# Patient Record
Sex: Female | Born: 1945 | Race: White | Hispanic: No | Smoking: Never smoker
Health system: Southern US, Community
[De-identification: ages and names within clinical notes are randomized; demographics above are authoritative.]

## PROBLEM LIST (undated history)

## (undated) DIAGNOSIS — I2510 Atherosclerotic heart disease of native coronary artery without angina pectoris: Secondary | ICD-10-CM

## (undated) DIAGNOSIS — F419 Anxiety disorder, unspecified: Secondary | ICD-10-CM

## (undated) DIAGNOSIS — E11319 Type 2 diabetes mellitus with unspecified diabetic retinopathy without macular edema: Secondary | ICD-10-CM

## (undated) DIAGNOSIS — E785 Hyperlipidemia, unspecified: Secondary | ICD-10-CM

## (undated) DIAGNOSIS — H35039 Hypertensive retinopathy, unspecified eye: Secondary | ICD-10-CM

## (undated) DIAGNOSIS — E039 Hypothyroidism, unspecified: Secondary | ICD-10-CM

## (undated) DIAGNOSIS — E119 Type 2 diabetes mellitus without complications: Secondary | ICD-10-CM

## (undated) DIAGNOSIS — I10 Essential (primary) hypertension: Secondary | ICD-10-CM

## (undated) DIAGNOSIS — E079 Disorder of thyroid, unspecified: Secondary | ICD-10-CM

## (undated) HISTORY — DX: Type 2 diabetes mellitus without complications: E11.9

## (undated) HISTORY — DX: Hypothyroidism, unspecified: E03.9

## (undated) HISTORY — DX: Hypertensive retinopathy, unspecified eye: H35.039

## (undated) HISTORY — PX: BREAST EXCISIONAL BIOPSY: SUR124

## (undated) HISTORY — DX: Atherosclerotic heart disease of native coronary artery without angina pectoris: I25.10

## (undated) HISTORY — DX: Type 2 diabetes mellitus with unspecified diabetic retinopathy without macular edema: E11.319

---

## 1998-05-03 ENCOUNTER — Other Ambulatory Visit: Admission: RE | Admit: 1998-05-03 | Discharge: 1998-05-03 | Payer: Self-pay | Admitting: Family Medicine

## 2005-03-05 ENCOUNTER — Inpatient Hospital Stay (HOSPITAL_COMMUNITY): Admission: EM | Admit: 2005-03-05 | Discharge: 2005-03-07 | Payer: Self-pay | Admitting: Emergency Medicine

## 2005-03-06 ENCOUNTER — Ambulatory Visit: Payer: Self-pay | Admitting: *Deleted

## 2006-06-05 ENCOUNTER — Ambulatory Visit (HOSPITAL_COMMUNITY): Admission: RE | Admit: 2006-06-05 | Discharge: 2006-06-05 | Payer: Self-pay | Admitting: Family Medicine

## 2006-08-03 ENCOUNTER — Emergency Department (HOSPITAL_COMMUNITY): Admission: EM | Admit: 2006-08-03 | Discharge: 2006-08-03 | Payer: Self-pay | Admitting: Emergency Medicine

## 2008-03-11 ENCOUNTER — Ambulatory Visit (HOSPITAL_COMMUNITY): Admission: RE | Admit: 2008-03-11 | Discharge: 2008-03-11 | Payer: Self-pay | Admitting: Family Medicine

## 2008-03-23 ENCOUNTER — Encounter: Admission: RE | Admit: 2008-03-23 | Discharge: 2008-03-23 | Payer: Self-pay | Admitting: Family Medicine

## 2008-03-25 ENCOUNTER — Encounter: Admission: RE | Admit: 2008-03-25 | Discharge: 2008-03-25 | Payer: Self-pay | Admitting: Family Medicine

## 2008-03-25 ENCOUNTER — Encounter (INDEPENDENT_AMBULATORY_CARE_PROVIDER_SITE_OTHER): Payer: Self-pay | Admitting: Diagnostic Radiology

## 2008-06-15 ENCOUNTER — Ambulatory Visit (HOSPITAL_BASED_OUTPATIENT_CLINIC_OR_DEPARTMENT_OTHER): Admission: RE | Admit: 2008-06-15 | Discharge: 2008-06-15 | Payer: Self-pay | Admitting: Surgery

## 2008-06-15 ENCOUNTER — Encounter: Admission: RE | Admit: 2008-06-15 | Discharge: 2008-06-15 | Payer: Self-pay | Admitting: Surgery

## 2008-06-15 ENCOUNTER — Encounter (INDEPENDENT_AMBULATORY_CARE_PROVIDER_SITE_OTHER): Payer: Self-pay | Admitting: Surgery

## 2008-09-14 ENCOUNTER — Emergency Department (HOSPITAL_COMMUNITY): Admission: EM | Admit: 2008-09-14 | Discharge: 2008-09-15 | Payer: Self-pay | Admitting: Emergency Medicine

## 2009-07-14 ENCOUNTER — Encounter: Admission: RE | Admit: 2009-07-14 | Discharge: 2009-07-14 | Payer: Self-pay | Admitting: Family Medicine

## 2010-07-03 ENCOUNTER — Other Ambulatory Visit: Payer: Self-pay | Admitting: Family Medicine

## 2010-07-03 DIAGNOSIS — Z1239 Encounter for other screening for malignant neoplasm of breast: Secondary | ICD-10-CM

## 2010-07-18 ENCOUNTER — Ambulatory Visit
Admission: RE | Admit: 2010-07-18 | Discharge: 2010-07-18 | Disposition: A | Discharge status: Home / Self Care | Payer: MEDICARE | Source: Ambulatory Visit | Attending: Family Medicine | Admitting: Family Medicine

## 2010-07-18 DIAGNOSIS — Z1239 Encounter for other screening for malignant neoplasm of breast: Secondary | ICD-10-CM

## 2010-09-20 LAB — DIFFERENTIAL
Basophils Absolute: 0 10*3/uL (ref 0.0–0.1)
Basophils Relative: 1 % (ref 0–1)
Eosinophils Absolute: 0.1 10*3/uL (ref 0.0–0.7)
Eosinophils Relative: 2 % (ref 0–5)
Lymphocytes Relative: 37 % (ref 12–46)
Monocytes Absolute: 0.4 10*3/uL (ref 0.1–1.0)

## 2010-09-20 LAB — CBC
RBC: 4.67 MIL/uL (ref 3.87–5.11)
WBC: 6.4 10*3/uL (ref 4.0–10.5)

## 2010-09-20 LAB — URINE CULTURE

## 2010-09-20 LAB — COMPREHENSIVE METABOLIC PANEL
ALT: 45 U/L — ABNORMAL HIGH (ref 0–35)
AST: 40 U/L — ABNORMAL HIGH (ref 0–37)
CO2: 24 mEq/L (ref 19–32)
Calcium: 9 mg/dL (ref 8.4–10.5)
Sodium: 134 mEq/L — ABNORMAL LOW (ref 135–145)
Total Protein: 7.6 g/dL (ref 6.0–8.3)

## 2010-09-20 LAB — URINALYSIS, ROUTINE W REFLEX MICROSCOPIC
Glucose, UA: NEGATIVE mg/dL
Ketones, ur: NEGATIVE mg/dL
Nitrite: NEGATIVE
Protein, ur: NEGATIVE mg/dL
Urobilinogen, UA: 0.2 mg/dL (ref 0.0–1.0)

## 2010-09-25 LAB — CBC
Hemoglobin: 14.2 g/dL (ref 12.0–15.0)
RBC: 4.85 MIL/uL (ref 3.87–5.11)
RDW: 13.8 % (ref 11.5–15.5)
WBC: 7.3 10*3/uL (ref 4.0–10.5)

## 2010-09-25 LAB — BASIC METABOLIC PANEL
Calcium: 9.8 mg/dL (ref 8.4–10.5)
Creatinine, Ser: 0.5 mg/dL (ref 0.4–1.2)
GFR calc non Af Amer: >60 mL/min (ref >60)
Sodium: 139 mEq/L (ref 135–145)

## 2010-09-25 LAB — DIFFERENTIAL
Eosinophils Absolute: 0.1 10*3/uL (ref 0.0–0.7)
Lymphs Abs: 2.5 10*3/uL (ref 0.7–4.0)
Monocytes Relative: 6 % (ref 3–12)
Neutrophils Relative %: 58 % (ref 43–77)

## 2010-09-25 LAB — GLUCOSE, CAPILLARY: Glucose-Capillary: 177 mg/dL — ABNORMAL HIGH (ref 70–99)

## 2010-10-24 NOTE — Op Note (Signed)
NAMELAURANN, MCMORRIS               ACCOUNT NO.:  [id number]   MEDICAL RECORD NO.:  [id number]          PATIENT TYPE:  AMB   LOCATION:  DSC                          FACILITY:  [facility]   PHYSICIAN:  Thomas A. Cornett, M.D.DATE OF BIRTH:  [birth year]   DATE OF PROCEDURE:  06/15/2008  DATE OF DISCHARGE:                               OPERATIVE REPORT   PREOPERATIVE DIAGNOSIS:  Left breast mass.   POSTOPERATIVE DIAGNOSIS:  Left breast mass.   PROCEDURE:  Left breast needle-localized lumpectomy.   SURGEON:  Maisie Fus A. Cornett, MD   ANESTHESIA:  LMA with 0.25% Sensorcaine local.   ESTIMATED BLOOD LOSS:  10 mL.   SPECIMEN:  Left breast tissue with localizing wire and preop clip, found  to be adequate by radiography.   INDICATIONS FOR PROCEDURE:  The patient is a 65 year old female who  underwent a left breast core biopsy.  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  There was some question if there was atypia or  not.  An excisional biopsy was recommended for definitive diagnosis.  She presents today for a left breast needle-localized biopsy for this.   DESCRIPTION OF PROCEDURE:  After undergoing left breast wire  localization in the radiology suite, the patient was brought to the  holding area and subsequently to the operating room.  After induction of  general anesthesia, the left breast was prepped and draped in a sterile  fashion.  We infiltrated the lateral portion of the breast with a wire  exited with 0.25% Sensorcaine.  Curvilinear incision was made adjacent  to the wire and dissection was carried around.  All tissue roughly in a  2-cm radius circumferentially was excised around the wire.  Radiographs  were done which showed the previously placed biopsy clip, localizing  wire, and region of interest to be within the specimen.  This was sent  to pathology.  The cavity was irrigated out.  It was closed in layers  with a deep layer of 3-0 Vicryl and subsequent 4-0  Monocryl layer.  Dermabond was applied as dressing.  All final counts of sponge, needle,  and instruments were found to be correct.  The patient was awoke, taken  to recovery in satisfactory condition.      Thomas A. Cornett, M.D.  Electronically Signed     TAC/MEDQ  D:  06/15/2008  T:  06/16/2008  Job:  [id number]   cc:   Melvyn Novas, MD

## 2010-10-27 NOTE — Procedures (Signed)
NAMEKIERSTON, PLASENCIA               ACCOUNT NO.:  [id number]   MEDICAL RECORD NO.:  [id number]          PATIENT TYPE:  INP   LOCATION:  IC06                          FACILITY:  [facility]   PHYSICIAN:  Vida Roller, M.D.   DATE OF BIRTH:  10-08-1945   DATE OF PROCEDURE:  03/07/2005  DATE OF DISCHARGE:                                    STRESS TEST   HISTORY:  Ms. Mccully is a 65 year old female with no known coronary disease  and atypical chest discomfort who was admitted to [facility].  She  has two sets of cardiac markers are negative for acute myocardial  infarction.  Cardiac risk factors include diabetes, hypertension and  hyperlipidemia.   BASELINE DATA:  Electrocardiogram reveals sinus rhythm at 97 beats per  minute, nonspecific ST abnormalities.  Blood pressure is 148/80.   The patient exercised for a total of 9 minutes through Bruce protocol stage  III and to 10.1 METS.  Maximum heart rate achieved was 178 beats per minute  which is 109% of predicted maximum.  Maximum blood pressure is 178/70 and  resolved down to 128/60 in recovery.   Electrocardiogram reveals no arrhythmias.  No ischemic changes were noted.  Exercise was stopped secondary to fatigue/shortness of breath.  The patient  denied any chest discomfort with exercise.  She did have some mild shortness  of breath that resolved in recovery.   Final images and results are pending M.D. review.      Jae Dire, P.A. [facility]      Vida Roller, M.D.  Electronically Signed    AB/MEDQ  D:  03/07/2005  T:  03/08/2005  Job:  [id number]

## 2010-10-27 NOTE — Consult Note (Signed)
Megan Schroeder, Megan Schroeder               ACCOUNT NO.:  [id number]   MEDICAL RECORD NO.:  [id number]          PATIENT TYPE:  INP   LOCATION:  IC06                          FACILITY:  [facility]   PHYSICIAN:  Vida Roller, M.D.   DATE OF BIRTH:  1945/07/07   DATE OF CONSULTATION:  03/06/2005  DATE OF DISCHARGE:                                   CONSULTATION   CARDIOLOGY CONSULTATION:   PRIMARY CARE PHYSICIAN:  Dr. Oval Linsey   REFERRING PHYSICIAN:  Dr. Christen Butter   HISTORY OF PRESENT ILLNESS:  Megan Schroeder is a 65 year old woman with  multiple cardiac risk factors who presents with a single episode of  discomfort in her chest, this started when she was putting some clothes in  the dryer at home.  She had the onset of severe neck pain followed by  substernal chest discomfort which progressively worsened until she presented  to Dr. Otilio Saber office.  She was evaluated at that time, given a  sublingual nitroglycerin with resolution of the discomfort and she was  admitted to [facility] for further evaluation.  She has had no  further discomfort since being in the hospital.  She has been on a  nitroglycerin drip.  She denies any PND or orthopnea; no lower extremity  edema.  Very active woman without any significant complaints.   MEDICATIONS PRIOR TO ADMISSION:  1.  Caduet 10/20 once a day.  2.  Glucophage 500 mg twice a day.  3.  Synthroid 75 mcg once a day.  4.  Clonidine 0.1 mg twice a day.  5.  Naprosyn 500 mg twice a day as needed.  6.  Benicar 40/12.5 mg once a day.  7.  Vitamin E once a day.  8.  Vitamin D with calcium 500 mg once a day.   In the hospital she is on aspirin 325 once a day, Lopressor 50 mg twice a  day, Glucophage 500 mg twice a day, Ativan 1 mg at bedtime, Synthroid 75 mcg  once a day, Avapro 300 mg once a day, Norvasc 10 mg once a day, Lipitor 20  mg once a day, Lovenox 80 mg q.12 h., nitroglycerin infusion.   PAST MEDICAL HISTORY:  Her past medical  history is significant for a stress  test in 1998 which was normal.  She has hypertension, hyperlipidemia,  diabetes mellitus, has no end organ problems with any of those.  She has a  history of vertigo and hypothyroidism.   SOCIAL HISTORY:  She lives in [place] with her husband.  She is retired.  She is married.  She has one son who is healthy.  She does not smoke, drink  or use illicit drugs.  She walks 2-1/2 miles every day without any trouble.   FAMILY HISTORY:  Her mother is alive age 29 with hypertension and  hypothyroidism.  Her father died of lung cancer and throat cancer in his 26s  and he had a myocardial infarction.  She has two sisters and two brothers  all of whom are healthy and none of them have coronary disease.  REVIEW OF SYSTEMS:  She denies any fever or chills, sweats, weight loss, or  adenopathy.  She has an occasional headache on the left side but no sinus  tenderness, no nasal discharge, no bleeding or voice changes, no vertigo, no  photophobia.  She denies any rash or lesions, denies any shortness of  breath, dyspnea on exertion, PND, orthopnea, lower extremity edema,  palpitations, she does have a history of panic attacks and will get  presyncopal during those but no claudication, cough, or wheezing, no  frequency, urgency, or dysuria, no weakness or numbness, no depression or  anxiety, no myalgias or arthralgias, joint swelling, no nausea, vomiting,  diarrhea, or bright red blood per rectum, melena, hematochezia, no  odynophagia or dysphagia, no GERD symptoms, no abdominal pain, no polyuria  or polydipsia.  The remainder of her review of systems is negative.   PHYSICAL EXAMINATION:  GENERAL:  She is a well-developed, well-nourished  white female in no apparent distress alert and oriented x4 who denies any  chest pain.  VITAL SIGNS:  Pulse is 82, respirations are 22, blood pressure 104/58,  saturating 96% on room air, she is afebrile, she weighs 182.4 pounds.   HEENT:  Examination of the head, ears, eyes, nose and throat is  unremarkable.  The neck is supple.  There is no jugular venous distention or  carotid bruits.  CHEST:  Her chest is clear to auscultation bilaterally.  Cardiovascular exam  is regular, point of maximal impulse is not displaced, she has no murmur,  first and second heart sounds are normal.  SKIN:  Skin is without rashes.  BREAST/GU AND RECTAL:  Breast exam, GU and rectal exam are all deferred.  ABDOMEN:  Her abdomen is soft, nontender, normoactive bowel sounds, no  hepatosplenomegaly, no bruits noted.  EXTREMITIES:  Her extremities have no clubbing, cyanosis, or edema, no  bruits, 1+ pulses throughout.  MUSCULOSKELETAL/NEUROLOGIC:  Musculoskeletal exam is nonfocal as is the  neurologic exam.   CHEST X-RAY:  No acute abnormalities.   ECHOCARDIOGRAM:  Normal LV function with no valvular heart disease.   ELECTROCARDIOGRAM:  Sinus rhythm at a rate of 73 with normal intervals,  normal axes, no ischemic ST-T wave changes, no Q waves concerning for an old  myocardial infarction.   LABORATORIES:  White blood cell count 7.5, H&H of 13 and 38, platelet count  246.  Sodium 136, potassium 3.8, chloride 102, bicarb 26, BUN 7, creatinine  0.8 and her blood sugar is 251.  D-dimer 0.22.  BNP less than 30.  Point-of-  care markers x2 are negative.   IMPRESSION:  This is a woman with atypical chest pain but multiple cardiac  risk factors who has diabetes, hypertension and is mildly tachycardic.  My  sense is that an exercise Myoview is probably the best test to move forward  with her.  She probably needs to continue her medications, otherwise, I  recommend that she be placed on low dose aspirin and we will follow her with  you.      Vida Roller, M.D.  Electronically Signed     JH/MEDQ  D:  03/06/2005  T:  03/06/2005  Job:  [id number]   cc:   Melvyn Novas, MD  Fax: [phone]  Hanley Hays. Josefine Class, M.D.  Fax:  [phone]

## 2010-10-27 NOTE — Procedures (Signed)
NAMECHERIA, SADIQ               ACCOUNT NO.:  [id number]   MEDICAL RECORD NO.:  [id number]          PATIENT TYPE:  INP   LOCATION:  IC06                          FACILITY:  [facility]   PHYSICIAN:  Vida Roller, M.D.   DATE OF BIRTH:  10-Aug-1945   DATE OF PROCEDURE:  03/06/2005  DATE OF DISCHARGE:                                  ECHOCARDIOGRAM   PRIMARY CARE PHYSICIAN:  Hanley Hays. Dechurch, M.D.   TAPE NUMBER:  [id number]   TAPE COUNT:  [id number]   HISTORY OF PRESENT ILLNESS:  This is a 65 year old woman with chest  discomfort.  Technical quality of the study is difficult.   M-MODE TRACINGS:  Aorta is 23 mm.   Left atrium is 37 mm.   Septum is 13 mm.   Posterior wall is 12 mm.   Left ventricular diastolic dimension 37 mm.   Left ventricular systolic dimension 25 mm.   A 2D AND DOPPLER IMAGING:  The left ventricle is normal size with preserved  LV systolic function, estimated ejection fraction 60-65%.  There is no  obvious wall motion abnormality.  It appears to be concentric left  ventricular hypertrophy.   The right ventricle is top normal in size with normal systolic function.   Both atria appear to be normal size.  There is no obvious valvular heart  disease, although, the valves were not interrogated adequately due to the  technical nature of the study.   There is no obvious pericardial effusion.      Vida Roller, M.D.  Electronically Signed     JH/MEDQ  D:  03/06/2005  T:  03/07/2005  Job:  [id number]

## 2010-10-27 NOTE — Discharge Summary (Signed)
Megan Schroeder, WINGERT               ACCOUNT NO.:  [id number]   MEDICAL RECORD NO.:  [id number]          PATIENT TYPE:  INP   LOCATION:  IC06                          FACILITY:  [facility]   PHYSICIAN:  Hanley Hays. Dechurch, M.D.DATE OF BIRTH:  [birth year]   DATE OF ADMISSION:  03/05/2005  DATE OF DISCHARGE:  09/27/2006LH                                 DISCHARGE SUMMARY   DISCHARGE DIAGNOSES:  1.  Atypical chest pain, negative Myoview, normal ejection fraction of 73%.  2.  Hypertension.  3.  Diabetes mellitus.  4.  Hypothyroidism, new diagnosis.  5.  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  6.  Left cervical radiculopathy, chronic and intermittent.  7.  History of benign paroxysmal positional vertigo.   HOSPITAL COURSE:  The patient is a 65 year old, Caucasian female with  diabetes mellitus x2 years and hypertension, hyperlipidemia and positive  family history who was in her usual state of health until the day of  admission when she had a sudden onset of a left posterior neck sensation  which she described as a sharp electric shock feeling that lasted seconds.  She has been having problems with her left neck for some time for which she  treats with rubs and over-the-counter products due to her spondylosis.  In  any event, she then developed substernal chest pressure which persisted.  She was seen by her primary care physician who gave her sublingual  nitroglycerin and  aspirin with near resolution of pain.  She was referred  to the emergency room for further evaluation and admission.  The patient had  no EKG changes.  Her cardiac enzymes remained negative.  She was initially  placed on nitroglycerin drip which was withdrawn without any further chest  pain.  The patient also had no further neck pain while in the hospital.  Records were reviewed from [facility] as well as  [facility] from  [place], when her symptoms with her vertigo and chest pain were evaluated.  The  patient was seen in consultation by cardiology who felt that her symptoms  were somewhat atypical and she underwent a stress Myoview.  There was no  evidence of ischemia or other abnormalities.  The patient also had an  echocardiogram which revealed normal LV size.  There were no other obvious  findings as the study was technically limited.  Chest x-ray was unremarkable  as well.  Laboratory tests were all normal as well.  TSH and lipid panel  were obtained, but not complete at the time of discharge.  In any event, the  patient was deemed stable for discharge.  Again, she had no further neck  pain during the hospital stay.  Because of her previous findings, dictated  by Dr. Danie Chandler  neurologist at [facility], it was felt  that any further workup of her neck pain would not  be necessary at this  time. The findings were discussed at length  with the patient and her spouse.  He was somewhat reluctant to  be discharged without doing some sort of  workup of her neck, but after a long discussion with the patient it was  clearly not indicated.  They agreed to the discharge and if symptoms from  her neck persisted, worsened, was associated with weakness or paresthesias  she was advised to  notify her primary MD or proceed to the ER.   DISCHARGE MEDICATIONS:  1.  Glucophage 500 mg b.i.d.  2.  Synthroid 75 mcg daily.  3.  Aspirin 81 mg daily.  4.  Clonidine 0.1 b.i.d.  5.  Caduat 10/25 b.i.d.  6.  Benicar 40 mg daily.  7.  Calcium plus D 500 mg daily.  8.  Naprosyn 500 b.i.d. which she was not using.   SPECIAL INSTRUCTIONS:  The patient was instructed to call to the be  recurrent symptoms or other as any other complaints.      Hanley Hays Josefine Class, M.D.  Electronically Signed     FED/MEDQ  D:  03/07/2005  T:  03/08/2005  Job:  [id number]

## 2011-06-27 ENCOUNTER — Other Ambulatory Visit: Payer: Self-pay | Admitting: Family Medicine

## 2011-06-27 DIAGNOSIS — Z1231 Encounter for screening mammogram for malignant neoplasm of breast: Secondary | ICD-10-CM

## 2011-07-21 ENCOUNTER — Inpatient Hospital Stay (HOSPITAL_COMMUNITY)
Admission: EM | Admit: 2011-07-21 | Discharge: 2011-07-24 | DRG: 287 | Disposition: A | Discharge status: Home / Self Care | Payer: Medicare Other | Attending: Internal Medicine | Admitting: Internal Medicine

## 2011-07-21 ENCOUNTER — Emergency Department (HOSPITAL_COMMUNITY): Payer: Medicare Other

## 2011-07-21 ENCOUNTER — Encounter (HOSPITAL_COMMUNITY): Payer: Self-pay | Admitting: Emergency Medicine

## 2011-07-21 DIAGNOSIS — R0789 Other chest pain: Principal | ICD-10-CM | POA: Diagnosis present

## 2011-07-21 DIAGNOSIS — R079 Chest pain, unspecified: Secondary | ICD-10-CM | POA: Diagnosis present

## 2011-07-21 DIAGNOSIS — I10 Essential (primary) hypertension: Secondary | ICD-10-CM | POA: Diagnosis present

## 2011-07-21 DIAGNOSIS — E119 Type 2 diabetes mellitus without complications: Secondary | ICD-10-CM | POA: Diagnosis present

## 2011-07-21 DIAGNOSIS — IMO0001 Reserved for inherently not codable concepts without codable children: Secondary | ICD-10-CM | POA: Diagnosis present

## 2011-07-21 DIAGNOSIS — D649 Anemia, unspecified: Secondary | ICD-10-CM | POA: Diagnosis present

## 2011-07-21 DIAGNOSIS — R51 Headache: Secondary | ICD-10-CM | POA: Diagnosis present

## 2011-07-21 DIAGNOSIS — E785 Hyperlipidemia, unspecified: Secondary | ICD-10-CM | POA: Diagnosis present

## 2011-07-21 DIAGNOSIS — I2510 Atherosclerotic heart disease of native coronary artery without angina pectoris: Secondary | ICD-10-CM | POA: Diagnosis present

## 2011-07-21 DIAGNOSIS — R519 Headache, unspecified: Secondary | ICD-10-CM | POA: Diagnosis present

## 2011-07-21 HISTORY — DX: Hyperlipidemia, unspecified: E78.5

## 2011-07-21 HISTORY — DX: Essential (primary) hypertension: I10

## 2011-07-21 HISTORY — DX: Disorder of thyroid, unspecified: E07.9

## 2011-07-21 LAB — CBC
HCT: 37 % (ref 36.0–46.0)
MCHC: 34.1 g/dL (ref 30.0–36.0)
MCV: 84.3 fL (ref 78.0–100.0)
RDW: 13.4 % (ref 11.5–15.5)

## 2011-07-21 LAB — BASIC METABOLIC PANEL
BUN: 10 mg/dL (ref 6–23)
Chloride: 96 mEq/L (ref 96–112)
Creatinine, Ser: 0.56 mg/dL (ref 0.50–1.10)
GFR calc Af Amer: >90 mL/min (ref >90)
GFR calc non Af Amer: >90 mL/min (ref >90)
Glucose, Bld: 251 mg/dL — ABNORMAL HIGH (ref 70–99)

## 2011-07-21 LAB — TROPONIN I: Troponin I: <0.3 ng/mL (ref <0.30)

## 2011-07-21 MED ORDER — ASPIRIN 325 MG PO TABS
325.0000 mg | ORAL_TABLET | ORAL | Status: AC
  Filled 2011-07-21: qty 1

## 2011-07-21 MED ORDER — MORPHINE SULFATE 4 MG/ML IJ SOLN
4.0000 mg | Freq: Once | INTRAMUSCULAR | Status: AC
  Administered 2011-07-21: 4 mg via INTRAVENOUS
  Filled 2011-07-21: qty 1

## 2011-07-21 MED ORDER — NITROGLYCERIN 0.4 MG SL SUBL
0.4000 mg | SUBLINGUAL_TABLET | SUBLINGUAL | Status: DC | PRN
  Administered 2011-07-21: 0.4 mg via SUBLINGUAL
  Filled 2011-07-21: qty 25

## 2011-07-21 NOTE — H&P (Signed)
Megan Schroeder is an 66 y.o. female.   Chief Complaint: Chest Pain HPI: 66 YO with history of HTN, DM and Hyperlipidemia here with one day history of Chest pain rated as 7/10, no radiation no diaporesis, no fever, no SOB, no cough. Patient had a headache yesterday prior to the chest pain. Chest pain was relieved by Nitro in the Er and was worsened by exertion. Has risk factors for CAD-HTN, DM, Hyperlipidemia and family history of CAD-Father had MI at 75.  Past Medical History  Diagnosis Date  . Diabetes mellitus   . Hypertension   . Thyroid disease   . Hyperlipidemia     History reviewed. No pertinent past surgical history.  History reviewed. No pertinent family history. Social History:  reports that she has never smoked. She has never used smokeless tobacco. She reports that she does not drink alcohol or use illicit drugs.  Allergies:  Allergies  Allergen Reactions  . Penicillins Hives    Medications Prior to Admission  Medication Dose Route Frequency Provider Last Rate Last Dose  . aspirin tablet 325 mg  325 mg Oral STAT Forbes Cellar, MD      . morphine 4 MG/ML injection 4 mg  4 mg Intravenous Once Verne Carrow, MD   4 mg at 07/21/11 2127  . nitroGLYCERIN (NITROSTAT) SL tablet 0.4 mg  0.4 mg Sublingual Q5 min PRN Forbes Cellar, MD   0.4 mg at 07/21/11 2110   No current outpatient prescriptions on file as of 07/21/2011.    Results for orders placed during the hospital encounter of 07/21/11 (from the past 48 hour(s))  CBC     Status: Normal   Collection Time   07/21/11  8:40 PM      Component Value Range Comment   WBC 7.3  4.0 - 10.5 (K/uL)    RBC 4.39  3.87 - 5.11 (MIL/uL)    Hemoglobin 12.6  12.0 - 15.0 (g/dL)    HCT 98.1  19.1 - 47.8 (%)    MCV 84.3  78.0 - 100.0 (fL)    MCH 28.7  26.0 - 34.0 (pg)    MCHC 34.1  30.0 - 36.0 (g/dL)    RDW 29.5  62.1 - 30.8 (%)    Platelets 169  150 - 400 (K/uL)   BASIC METABOLIC PANEL     Status: Abnormal   Collection Time   07/21/11   8:40 PM      Component Value Range Comment   Sodium 135  135 - 145 (mEq/L)    Potassium 3.5  3.5 - 5.1 (mEq/L)    Chloride 96  96 - 112 (mEq/L)    CO2 27  19 - 32 (mEq/L)    Glucose, Bld 251 (*) 70 - 99 (mg/dL)    BUN 10  6 - 23 (mg/dL)    Creatinine, Ser 6.57  0.50 - 1.10 (mg/dL)    Calcium 84.6  8.4 - 10.5 (mg/dL)    GFR calc non Af Amer >90  >90 (mL/min)    GFR calc Af Amer >90  >90 (mL/min)   PRO B NATRIURETIC PEPTIDE     Status: Normal   Collection Time   07/21/11  8:41 PM      Component Value Range Comment   Pro B Natriuretic peptide (BNP) 16.2  0 - 125 (pg/mL)   POCT I-STAT TROPONIN I     Status: Normal   Collection Time   07/21/11  8:54 PM  Component Value Range Comment   Troponin i, poc 0.00  0.00 - 0.08 (ng/mL)    Comment 3            TROPONIN I     Status: Normal   Collection Time   07/21/11  9:09 PM      Component Value Range Comment   Troponin I <0.30  <0.30 (ng/mL)    Chest Portable 1 View  07/21/2011  *RADIOLOGY REPORT*  Clinical Data: Headache, chest pain, and chest pressure  PORTABLE CHEST - 1 VIEW  Comparison: 09/25 of six  Findings: Shallow inspiration with elevation of the right hemidiaphragm.  Heart size and pulmonary vascularity are normal. No focal airspace consolidation in the lungs.  No blunting of costophrenic angles.  No pneumothorax.  No significant change since previous study.  IMPRESSION: No evidence of active pulmonary disease.  Original Report Authenticated By: Marlon Pel, M.D.    Review of Systems  Constitutional: Negative.   HENT: Negative.   Eyes: Negative.   Respiratory: Negative.   Cardiovascular: Positive for chest pain. Negative for palpitations, orthopnea, claudication, leg swelling and PND.  Gastrointestinal: Negative.   Genitourinary: Negative.   Musculoskeletal: Negative.   Skin: Negative.   Neurological: Negative.   Endo/Heme/Allergies: Negative.   Psychiatric/Behavioral: Negative.     Blood pressure 117/63, pulse 94,  temperature 98.3 F (36.8 C), temperature source Oral, resp. rate 17, height 5\' 3"  (1.6 m), weight 74.844 kg (165 lb), SpO2 99.00%. Physical Exam  Nursing note and vitals reviewed. Constitutional: She is oriented to person, place, and time. She appears well-developed and well-nourished.  HENT:  Head: Normocephalic and atraumatic.  Right Ear: External ear normal.  Left Ear: External ear normal.  Eyes: Conjunctivae and EOM are normal. Pupils are equal, round, and reactive to light.  Neck: Normal range of motion. Neck supple.  Cardiovascular: Normal rate, regular rhythm, normal heart sounds and intact distal pulses.   Respiratory: Effort normal and breath sounds normal.  GI: Soft. Bowel sounds are normal.  Musculoskeletal: Normal range of motion.  Neurological: She is alert and oriented to person, place, and time.  Skin: Skin is warm and dry.  Psychiatric: She has a normal mood and affect. Her behavior is normal. Judgment and thought content normal.     Assessment/Plan 1. Chest Pain: Patient has significant risk factors for CAD and will be admitted for observation and MI rule out. Will need a stress test if enzymes are negative.Give SL NTG, Aspirin, Morphine. 2. DM: Continue home medications and SSI if needed 3. HTN: Controlled 4. Hyperlipidemia: Statin  Montford Barg,LAWAL 07/21/2011, 10:46 PM

## 2011-07-21 NOTE — ED Provider Notes (Signed)
History     CSN: [id number]  Arrival date & time 07/21/11  2018   First MD Initiated Contact with Patient 07/21/11 2054      Chief Complaint  Patient presents with  . Chest Pain    denies pain c/o chest pressure    (Consider location/radiation/quality/duration/timing/severity/associated sxs/prior treatment) Patient is a 66 y.o. female presenting with chest pain. The history is provided by the patient.  Chest Pain Episode onset: about 3 hours prior to arrival in ED.  Duration of episode(s) is 3 hours. Chest pain occurs constantly. The chest pain is improving. Associated with: onset while watching TV with her grandchild. At its most intense, the pain is at 10/10. The pain is currently at 7/10. The quality of the pain is described as pressure-like. The pain does not radiate. Exacerbated by: Not worse with deep breathing. Not worse with exertion. Primary symptoms include shortness of breath and nausea. Pertinent negatives for primary symptoms include no fever, no fatigue, no syncope, no cough, no wheezing, no palpitations, no abdominal pain, no vomiting, no dizziness and no altered mental status.  Pertinent negatives for associated symptoms include no claudication, no diaphoresis, no lower extremity edema, no numbness, no orthopnea, no paroxysmal nocturnal dyspnea and no weakness. She tried aspirin and nitroglycerin for the symptoms.  Her past medical history is significant for diabetes, hyperlipidemia and hypertension.  Pertinent negatives for past medical history include no seizures.     Past Medical History  Diagnosis Date  . Diabetes mellitus   . Hypertension   . Thyroid disease   . Hyperlipidemia     History reviewed. No pertinent past surgical history.  History reviewed. No pertinent family history.  History  Substance Use Topics  . Smoking status: Never Smoker   . Smokeless tobacco: Never Used  . Alcohol Use: No    OB History    Grav Para Term Preterm Abortions TAB SAB  Ect Mult Living                  Review of Systems  Constitutional: Negative for fever, chills, diaphoresis, activity change, appetite change and fatigue.  HENT: Negative for congestion, sore throat, rhinorrhea, neck pain and neck stiffness.   Eyes: Negative for photophobia, redness and visual disturbance.  Respiratory: Positive for shortness of breath. Negative for cough and wheezing.   Cardiovascular: Positive for chest pain. Negative for palpitations, orthopnea, claudication, leg swelling and syncope.  Gastrointestinal: Positive for nausea. Negative for vomiting, abdominal pain, diarrhea, constipation and blood in stool.  Genitourinary: Negative for dysuria, urgency, hematuria and flank pain.  Musculoskeletal: Negative for back pain.  Skin: Negative for rash and wound.  Neurological: Negative for dizziness, seizures, facial asymmetry, speech difficulty, weakness, light-headedness, numbness and headaches.  Psychiatric/Behavioral: Negative for confusion and altered mental status.  All other systems reviewed and are negative.    Allergies  Penicillins  Home Medications   Current Outpatient Rx  Name Route Sig Dispense Refill  . AMLODIPINE BESYLATE 10 MG PO TABS Oral Take 10 mg by mouth daily.    . ASPIRIN EC 81 MG PO TBEC Oral Take 81 mg by mouth daily.    . ATORVASTATIN CALCIUM 20 MG PO TABS Oral Take 20 mg by mouth daily.    . INSULIN GLARGINE 100 UNIT/ML [place] SOLN Subcutaneous Inject 38 Units into the skin at bedtime.    Marland Kitchen LEVOTHYROXINE SODIUM 75 MCG PO TABS Oral Take 75 mcg by mouth daily.    Marland Kitchen METFORMIN HCL 500 MG  PO TABS Oral Take 500 mg by mouth 2 (two) times daily with a meal.    . OLMESARTAN MEDOXOMIL-HCTZ 40-12.5 MG PO TABS Oral Take 1 tablet by mouth daily.      BP 146/74  Pulse 105  Temp(Src) 98.3 F (36.8 C) (Oral)  Resp 19  Ht 5\' 3"  (1.6 m)  Wt 165 lb (74.844 kg)  BMI 29.23 kg/m2  SpO2 99%  Physical Exam  Nursing note and vitals reviewed. Constitutional:  She is oriented to person, place, and time. She appears well-developed and well-nourished.  Non-toxic appearance. No distress.  HENT:  Head: Normocephalic and atraumatic.  Mouth/Throat: Oropharynx is clear and moist.  Eyes: Conjunctivae and EOM are normal. Pupils are equal, round, and reactive to light. No scleral icterus.  Neck: Normal range of motion. Neck supple. No JVD present.  Cardiovascular: Normal rate, regular rhythm, normal heart sounds and intact distal pulses.   No murmur heard. Pulmonary/Chest: Effort normal and breath sounds normal. No respiratory distress. She has no wheezes. She has no rales.  Abdominal: Soft. Bowel sounds are normal. She exhibits no distension. There is no tenderness. There is no rebound and no guarding.  Musculoskeletal: Normal range of motion.  Neurological: She is alert and oriented to person, place, and time. She has normal strength. No cranial nerve deficit. GCS eye subscore is 4. GCS verbal subscore is 5. GCS motor subscore is 6.  Skin: Skin is warm and dry. No rash noted. She is not diaphoretic.  Psychiatric: She has a normal mood and affect.    ED Course  Procedures (including critical care time)   Date: 07/21/2011  Rate: 101  Rhythm: sinus tachycardia  QRS Axis: normal  Intervals: normal  ST/T Wave abnormalities: normal  Conduction Disutrbances:none  Narrative Interpretation: sinus tachycardia, rate 101, with no ST segment changes  Old EKG Reviewed: unchanged from 06/14/08    Labs Reviewed  BASIC METABOLIC PANEL - Abnormal; Notable for the following:    Glucose, Bld 251 (*)    All other components within normal limits  CBC  PRO B NATRIURETIC PEPTIDE  POCT I-STAT TROPONIN I  TROPONIN I   Chest Portable 1 View  07/21/2011  *RADIOLOGY REPORT*  Clinical Data: Headache, chest pain, and chest pressure  PORTABLE CHEST - 1 VIEW  Comparison: 09/25 of six  Findings: Shallow inspiration with elevation of the right hemidiaphragm.  Heart size and  pulmonary vascularity are normal. No focal airspace consolidation in the lungs.  No blunting of costophrenic angles.  No pneumothorax.  No significant change since previous study.  IMPRESSION: No evidence of active pulmonary disease.  Original Report Authenticated By: Marlon Pel, M.D.     1. Chest pain       MDM  65yo CF with PMH significant HTN, HLD, and DM who presents to the ED due to chest pain. Pain onset about 3h PTA. Pain described as pressure, feels like "elephant" sitting on her chest. Also having dyspnea. Pt called EMS took 2 81mg  ASA at home and EMS instructed her to take an additional 3- 81mg . Got nitro via EMS with improvement of pain from 10 to 7. Will give additional nitro. Pt c/o HA had mild HA previously but worse after nitro, will give morphine and tylenol. EKG with no ST changes. Concern for possible NSTEMI vs UA. Doubt PE cannot PERC due to age, pt did initially have elevated HR on arrival but now HR in 80s, she is low risk [place] so will not persue  any further PE workup as unlikely with this presentation.   Pt pain free after additional nitro SL given.   Initial trop neg.   Pts PCP in [place]. [facility] consulted for admission.        Verne Carrow, MD 07/21/11 [id number]

## 2011-07-21 NOTE — ED Notes (Signed)
Pt arrived via [facility] c/o chest pressure that started after headache. Headache x2 days chest pressure start this PM

## 2011-07-21 NOTE — ED Provider Notes (Signed)
I saw and evaluated the patient, reviewed the resident's note and I agree with the findings and plan, ekg interpretation (reviewed by me)    Forbes Cellar, MD 07/21/11 [phone]

## 2011-07-22 ENCOUNTER — Other Ambulatory Visit: Payer: Self-pay

## 2011-07-22 ENCOUNTER — Encounter (HOSPITAL_COMMUNITY): Payer: Self-pay | Admitting: *Deleted

## 2011-07-22 DIAGNOSIS — R519 Headache, unspecified: Secondary | ICD-10-CM | POA: Diagnosis present

## 2011-07-22 DIAGNOSIS — R079 Chest pain, unspecified: Secondary | ICD-10-CM

## 2011-07-22 DIAGNOSIS — R51 Headache: Secondary | ICD-10-CM | POA: Diagnosis present

## 2011-07-22 LAB — GLUCOSE, CAPILLARY
Glucose-Capillary: 167 mg/dL — ABNORMAL HIGH (ref 70–99)
Glucose-Capillary: 261 mg/dL — ABNORMAL HIGH (ref 70–99)

## 2011-07-22 LAB — CARDIAC PANEL(CRET KIN+CKTOT+MB+TROPI)
CK, MB: 2.5 ng/mL (ref 0.3–4.0)
Relative Index: INVALID (ref 0.0–2.5)
Total CK: 82 U/L (ref 7–177)
Total CK: 80 U/L (ref 7–177)

## 2011-07-22 LAB — PRO B NATRIURETIC PEPTIDE: Pro B Natriuretic peptide (BNP): 16.6 pg/mL (ref 0–125)

## 2011-07-22 LAB — CBC
HCT: 36.3 % (ref 36.0–46.0)
Hemoglobin: 12.2 g/dL (ref 12.0–15.0)
MCH: 28.4 pg (ref 26.0–34.0)
MCHC: 33.6 g/dL (ref 30.0–36.0)
MCV: 84.6 fL (ref 78.0–100.0)
RDW: 13.5 % (ref 11.5–15.5)

## 2011-07-22 LAB — HEMOGLOBIN A1C
Hgb A1c MFr Bld: 10.2 % — ABNORMAL HIGH (ref <5.7)
Mean Plasma Glucose: 246 mg/dL — ABNORMAL HIGH (ref <117)

## 2011-07-22 MED ORDER — SODIUM CHLORIDE 0.9 % IV SOLN
INTRAVENOUS | Status: DC
  Administered 2011-07-23: 04:00:00 via INTRAVENOUS

## 2011-07-22 MED ORDER — LEVOTHYROXINE SODIUM 75 MCG PO TABS
75.0000 ug | ORAL_TABLET | Freq: Every day | ORAL | Status: DC
  Administered 2011-07-22 – 2011-07-24 (×3): 75 ug via ORAL
  Filled 2011-07-22 (×5): qty 1

## 2011-07-22 MED ORDER — HYDROCHLOROTHIAZIDE 12.5 MG PO CAPS
12.5000 mg | ORAL_CAPSULE | Freq: Every day | ORAL | Status: DC
  Administered 2011-07-22: 12.5 mg via ORAL
  Filled 2011-07-22 (×2): qty 1

## 2011-07-22 MED ORDER — ACETAMINOPHEN 650 MG RE SUPP: 650.0000 mg | Freq: Four times a day (QID) | RECTAL | Status: DC | PRN

## 2011-07-22 MED ORDER — SODIUM CHLORIDE 0.9 % IJ SOLN: 3.0000 mL | INTRAMUSCULAR | Status: DC | PRN

## 2011-07-22 MED ORDER — INSULIN ASPART 100 UNIT/ML ~~LOC~~ SOLN
0.0000 [IU] | Freq: Every day | SUBCUTANEOUS | Status: DC
  Administered 2011-07-23: 2 [IU] via SUBCUTANEOUS

## 2011-07-22 MED ORDER — SODIUM CHLORIDE 0.9 % IV SOLN: 250.0000 mL | INTRAVENOUS | Status: DC | PRN

## 2011-07-22 MED ORDER — ASPIRIN EC 81 MG PO TBEC: 81.0000 mg | DELAYED_RELEASE_TABLET | Freq: Every day | ORAL | Status: DC

## 2011-07-22 MED ORDER — OLMESARTAN MEDOXOMIL-HCTZ 40-12.5 MG PO TABS: 1.0000 | ORAL_TABLET | Freq: Every day | ORAL | Status: DC

## 2011-07-22 MED ORDER — ACETAMINOPHEN 325 MG PO TABS: 650.0000 mg | ORAL_TABLET | Freq: Four times a day (QID) | ORAL | Status: DC | PRN

## 2011-07-22 MED ORDER — OLMESARTAN MEDOXOMIL 40 MG PO TABS
40.0000 mg | ORAL_TABLET | Freq: Every day | ORAL | Status: DC
  Administered 2011-07-22 – 2011-07-24 (×3): 40 mg via ORAL
  Filled 2011-07-22 (×3): qty 1

## 2011-07-22 MED ORDER — ASPIRIN EC 325 MG PO TBEC
325.0000 mg | DELAYED_RELEASE_TABLET | Freq: Every day | ORAL | Status: DC
  Administered 2011-07-22 – 2011-07-24 (×2): 325 mg via ORAL
  Filled 2011-07-22 (×2): qty 1

## 2011-07-22 MED ORDER — SODIUM CHLORIDE 0.45 % IV SOLN
INTRAVENOUS | Status: DC
  Administered 2011-07-22: 08:00:00 via INTRAVENOUS

## 2011-07-22 MED ORDER — ENOXAPARIN SODIUM 40 MG/0.4ML ~~LOC~~ SOLN
40.0000 mg | SUBCUTANEOUS | Status: DC
  Administered 2011-07-22: 40 mg via SUBCUTANEOUS
  Filled 2011-07-22 (×3): qty 0.4

## 2011-07-22 MED ORDER — INSULIN GLARGINE 100 UNIT/ML ~~LOC~~ SOLN
38.0000 [IU] | Freq: Every day | SUBCUTANEOUS | Status: DC
  Administered 2011-07-22 – 2011-07-23 (×2): 38 [IU] via SUBCUTANEOUS
  Filled 2011-07-22: qty 3

## 2011-07-22 MED ORDER — AMLODIPINE BESYLATE 10 MG PO TABS
10.0000 mg | ORAL_TABLET | Freq: Every day | ORAL | Status: DC
  Administered 2011-07-22 – 2011-07-24 (×3): 10 mg via ORAL
  Filled 2011-07-22 (×3): qty 1

## 2011-07-22 MED ORDER — ASPIRIN 81 MG PO CHEW
324.0000 mg | CHEWABLE_TABLET | ORAL | Status: AC
  Administered 2011-07-23: 324 mg via ORAL
  Filled 2011-07-22: qty 4

## 2011-07-22 MED ORDER — SIMVASTATIN 20 MG PO TABS
20.0000 mg | ORAL_TABLET | Freq: Every day | ORAL | Status: DC
  Administered 2011-07-22 – 2011-07-23 (×2): 20 mg via ORAL
  Filled 2011-07-22 (×3): qty 1

## 2011-07-22 MED ORDER — SODIUM CHLORIDE 0.9 % IJ SOLN
3.0000 mL | Freq: Two times a day (BID) | INTRAMUSCULAR | Status: DC
  Administered 2011-07-22: 3 mL via INTRAVENOUS

## 2011-07-22 MED ORDER — ONDANSETRON HCL 4 MG/2ML IJ SOLN: 4.0000 mg | Freq: Four times a day (QID) | INTRAMUSCULAR | Status: DC | PRN

## 2011-07-22 MED ORDER — METFORMIN HCL 500 MG PO TABS
500.0000 mg | ORAL_TABLET | Freq: Two times a day (BID) | ORAL | Status: DC
  Administered 2011-07-22: 500 mg via ORAL
  Filled 2011-07-22 (×4): qty 1

## 2011-07-22 MED ORDER — NITROGLYCERIN 0.4 MG SL SUBL
0.4000 mg | SUBLINGUAL_TABLET | SUBLINGUAL | Status: DC | PRN
Start: 2011-07-22 — End: 2011-07-24

## 2011-07-22 MED ORDER — ONDANSETRON HCL 4 MG PO TABS: 4.0000 mg | ORAL_TABLET | Freq: Four times a day (QID) | ORAL | Status: DC | PRN

## 2011-07-22 MED ORDER — MORPHINE SULFATE 2 MG/ML IJ SOLN: 1.0000 mg | INTRAMUSCULAR | Status: DC | PRN

## 2011-07-22 MED ORDER — INSULIN ASPART 100 UNIT/ML ~~LOC~~ SOLN
0.0000 [IU] | Freq: Three times a day (TID) | SUBCUTANEOUS | Status: DC
  Administered 2011-07-22: 3 [IU] via SUBCUTANEOUS
  Administered 2011-07-22: 2 [IU] via SUBCUTANEOUS
  Administered 2011-07-23: 1 [IU] via SUBCUTANEOUS
  Administered 2011-07-23 – 2011-07-24 (×4): 2 [IU] via SUBCUTANEOUS
  Filled 2011-07-22: qty 3

## 2011-07-22 NOTE — Consult Note (Signed)
CARDIOLOGY CONSULT NOTE  Patient ID: Megan Schroeder MRN: [id number] DOB/AGE: [birth year] 66 y.o.  Admit date: 07/21/2011 Referring Physician: Claybon Jabs Primary Physician: [name], MD, MD Primary Cardiologist:  New Reason for Consultation: Chest Pain  Principal Problem:  *Chest pain Active Problems:  Hypertension  Diabetes mellitus  Hyperlipidemia  Headache   HPI:   66 yo IDDM, HTN, elevated lipids and family history CAD.  Admitted with severe episode of chest pain " elephant" sitting on my chest.  Lasted over an hour with some diaphoresis and dyspnea Some relief with nitro.  No gone this am.  R/O no acute ECG changes.  Saw [facility] in 1997 with syncope and negative w/u.  Gets primary care in [place].  Does walk with no chest pain in general.  BS  Poorly controlled with am BS around 170's.  Compliant with meds Been on insulin about a year.  No associated gi symptoms.  No pleuritic component.  Pain was central in chest 10/10 and radiated to neck She had a bad headache with it.  Both now improved  @ROS @ All other systems reviewed and negative except as noted above  Past Medical History  Diagnosis Date  . Diabetes mellitus   . Hypertension   . Thyroid disease   . Hyperlipidemia     History reviewed. No pertinent family history.  History   Social History  . Marital Status: Widowed    Spouse Name: N/A    Number of Children: N/A  . Years of Education: N/A   Occupational History  . Not on file.   Social History Main Topics  . Smoking status: Never Smoker   . Smokeless tobacco: Never Used  . Alcohol Use: No  . Drug Use: No  . Sexually Active: Yes    Birth Control/ Protection: Post-menopausal   Other Topics Concern  . Not on file   Social History Narrative  . No narrative on file    History reviewed. No pertinent past surgical history.      Marland Kitchen amLODipine  10 mg Oral Daily  . aspirin EC  325 mg Oral Daily  . aspirin  325 mg Oral STAT  . enoxaparin  40  mg Subcutaneous Q24H  . hydrochlorothiazide  12.5 mg Oral Daily  . insulin aspart  0-5 Units Subcutaneous QHS  . insulin aspart  0-9 Units Subcutaneous TID WC  . insulin glargine  38 Units Subcutaneous QHS  . levothyroxine  75 mcg Oral QAC breakfast  .  morphine injection  4 mg Intravenous Once  . olmesartan  40 mg Oral Daily  . simvastatin  20 mg Oral q1800  . DISCONTD: aspirin EC  81 mg Oral Daily  . DISCONTD: metFORMIN  500 mg Oral BID WC  . DISCONTD: olmesartan-hydrochlorothiazide  1 tablet Oral Daily      . DISCONTD: sodium chloride 75 mL/hr at 07/22/11 0803    Physical Exam: Blood pressure 146/81, pulse 72, temperature 98.1 F (36.7 C), temperature source Oral, resp. rate 14, height 5\' 3"  (1.6 m), weight 77.2 kg (170 lb 3.1 oz), SpO2 98.00%.  Affect appropriate Obese white femal HEENT: normal Neck supple with no adenopathy JVP normal no bruits no thyromegaly Lungs clear with no wheezing and good diaphragmatic motion Heart:  S1/S2 no murmur, no rub, gallop or click PMI normal Abdomen: benighn, BS positve, no tenderness, no AAA no bruit.  No HSM or HJR Distal pulses intact with no bruits No edema Neuro non-focal Skin warm and dry No muscular  weakness   Labs:   Lab Results  Component Value Date   WBC 8.3 07/22/2011   HGB 12.2 07/22/2011   HCT 36.3 07/22/2011   MCV 84.6 07/22/2011   PLT 183 07/22/2011    Lab 07/22/11 0103 07/21/11 2040  NA -- 135  K -- 3.5  CL -- 96  CO2 -- 27  BUN -- 10  CREATININE 0.51 --  CALCIUM -- 10.4  PROT -- --  BILITOT -- --  ALKPHOS -- --  ALT -- --  AST -- --  GLUCOSE -- 251*   Lab Results  Component Value Date   TROPONINI <0.30 07/21/2011       Radiology: Chest Portable 1 View  07/21/2011  *RADIOLOGY REPORT*  Clinical Data: Headache, chest pain, and chest pressure  PORTABLE CHEST - 1 VIEW  Comparison: 09/25 of six  Findings: Shallow inspiration with elevation of the right hemidiaphragm.  Heart size and pulmonary  vascularity are normal. No focal airspace consolidation in the lungs.  No blunting of costophrenic angles.  No pneumothorax.  No significant change since previous study.  IMPRESSION: No evidence of active pulmonary disease.  Original Report Authenticated By: Marlon Pel, M.D.    EKG: NSR no acute ECG changes compared to 2010 lest lateral ST segment depression   ASSESSMENT AND PLAN:  Chest Pain:  Bad episode relieved with nitro.  IDDM and every other risk factor  Discussed with patient and husband.  Favor cath to risk stratify.  Risks and benefits discussed.  They are willing to proceed DM:  Check A1C.  Give lantus tonight and hold glucophage.  NPO after breakfast in am for cath Chol:  Continue statin HTN:  Continue ACE  Signed: Charlton Haws 07/22/2011, 11:37 AM

## 2011-07-22 NOTE — Progress Notes (Signed)
Subjective:   66 year old Caucasian female patient with history of hypertension, type 2 diabetes mellitus, hyperlipidemia, family history of coronary artery disease, physically quite active, presented with lower substernal 10 over 10 pressure-like chest pain which started approximately 6 PM last night at rest. Was nonradiating and not associated with dyspnea or diaphoresis. TUMS did not help her. EMS was called and she received 2 nitroglycerin sprays on route to the ED which helped the pain but it eventually resolved after sublingual nitroglycerin tablet in the emergency department. She says the pain lasted between 6 PM and midnight. She denies any chest pain this morning.  Review of systems: Mild intermittent left temporal headache for the last 2 days. No visual disturbance or earache or sore throat. Currently resolved.  Objective  Vital signs in last 24 hours: Filed Vitals:   07/22/11 0015 07/22/11 0050 07/22/11 0600 07/22/11 1011  BP: 102/51 143/78 129/78 146/81  Pulse: 75 79 72   Temp:  98.5 F (36.9 C) 98.1 F (36.7 C)   TempSrc:      Resp: 17 18 14    Height:  5\' 3"  (1.6 m)    Weight:  77.2 kg (170 lb 3.1 oz)    SpO2: 96% 93% 98%    Weight change:   Intake/Output Summary (Last 24 hours) at 07/22/11 1100 Last data filed at 07/22/11 0700  Gross per 24 hour  Intake    360 ml  Output      0 ml  Net    360 ml    Physical Exam:  General Exam: Comfortable.  Respiratory System: Clear. No increased work of breathing.  Cardiovascular System: First and second heart sounds heard. Regular rate and rhythm. No JVD/murmurs. Telemetry shows sinus rhythm. Gastrointestinal System: Abdomen is non distended, soft and normal bowel sounds heard.  Central Nervous System: Alert and oriented. No focal neurological deficits.  Labs:  Basic Metabolic Panel:  Lab 07/22/11 1610 07/21/11 2040  NA -- 135  K -- 3.5  CL -- 96  CO2 -- 27  GLUCOSE -- 251*  BUN -- 10  CREATININE 0.51 0.56  CALCIUM  -- 10.4  ALB -- --  PHOS -- --   Liver Function Tests: No results found for this basename: AST:3,ALT:3,ALKPHOS:3,BILITOT:3,PROT:3,ALBUMIN:3 in the last 168 hours No results found for this basename: LIPASE:3,AMYLASE:3 in the last 168 hours No results found for this basename: AMMONIA:3 in the last 168 hours CBC:  Lab 07/22/11 0103 07/21/11 2040  WBC 8.3 7.3  NEUTROABS -- --  HGB 12.2 12.6  HCT 36.3 37.0  MCV 84.6 84.3  PLT 183 169   Cardiac Enzymes:  Lab 07/21/11 2109  CKTOTAL --  CKMB --  CKMBINDEX --  TROPONINI <0.30   CBG:  Lab 07/22/11 0733 07/22/11 0053  GLUCAP 167* 261*    Iron Studies: No results found for this basename: IRON,TIBC,TRANSFERRIN,FERRITIN in the last 72 hours Studies/Results: Chest Portable 1 View  07/21/2011  *RADIOLOGY REPORT*  Clinical Data: Headache, chest pain, and chest pressure  PORTABLE CHEST - 1 VIEW  Comparison: 09/25 of six  Findings: Shallow inspiration with elevation of the right hemidiaphragm.  Heart size and pulmonary vascularity are normal. No focal airspace consolidation in the lungs.  No blunting of costophrenic angles.  No pneumothorax.  No significant change since previous study.  IMPRESSION: No evidence of active pulmonary disease.  Original Report Authenticated By: Marlon Pel, M.D.   EKG interpretation by ED MD: unchanged from 06/14/08. No ST T changes Medications:    .  sodium chloride 75 mL/hr at 07/22/11 0803      . amLODipine  10 mg Oral Daily  . aspirin EC  325 mg Oral Daily  . aspirin  325 mg Oral STAT  . enoxaparin  40 mg Subcutaneous Q24H  . hydrochlorothiazide  12.5 mg Oral Daily  . insulin glargine  38 Units Subcutaneous QHS  . levothyroxine  75 mcg Oral QAC breakfast  . metFORMIN  500 mg Oral BID WC  .  morphine injection  4 mg Intravenous Once  . olmesartan  40 mg Oral Daily  . simvastatin  20 mg Oral q1800  . DISCONTD: aspirin EC  81 mg Oral Daily  . DISCONTD: olmesartan-hydrochlorothiazide  1 tablet  Oral Daily    I  have reviewed scheduled and prn medications.     Problem/Plan: Principal Problem:  *Chest pain Active Problems:  Hypertension  Diabetes mellitus  Hyperlipidemia   1. Chest pain: Suspicious for angina in a patient with multiple risk factors for coronary artery disease including type 2 diabetes mellitus, hypertension, hyperlipidemia and family history. Continue to cycle cardiac enzymes. Continue aspirin and statins. [facility] has been consulted for further evaluation. 2. Headache: Unclear etiology. Resolved. 3. Uncontrolled type 2 diabetes mellitus: Continue home dose of Lantus. Hold metformin in case patient requires a contrasted study. Place on sliding scale insulin. Check hemoglobin A1c. 4. Hypertension: Controlled. Continue ARB, HCTZ and amlodipine. 5. Hyperlipidemia: Continue statins.  Discussed with patient and spouse at the bedside.   Heber Hoog 07/22/2011,11:00 AM  LOS: 1 day

## 2011-07-23 ENCOUNTER — Encounter (HOSPITAL_COMMUNITY)
Admission: EM | Disposition: A | Discharge status: Home / Self Care | Payer: Self-pay | Source: Home / Self Care | Attending: Internal Medicine

## 2011-07-23 DIAGNOSIS — I517 Cardiomegaly: Secondary | ICD-10-CM

## 2011-07-23 DIAGNOSIS — I2510 Atherosclerotic heart disease of native coronary artery without angina pectoris: Secondary | ICD-10-CM

## 2011-07-23 HISTORY — PX: LEFT HEART CATHETERIZATION WITH CORONARY ANGIOGRAM: SHX5451

## 2011-07-23 LAB — CARDIAC PANEL(CRET KIN+CKTOT+MB+TROPI)
CK, MB: 1.9 ng/mL (ref 0.3–4.0)
Total CK: 66 U/L (ref 7–177)

## 2011-07-23 LAB — BASIC METABOLIC PANEL
Chloride: 99 mEq/L (ref 96–112)
GFR calc Af Amer: >90 mL/min (ref >90)
Potassium: 3.7 mEq/L (ref 3.5–5.1)

## 2011-07-23 LAB — GLUCOSE, CAPILLARY: Glucose-Capillary: 164 mg/dL — ABNORMAL HIGH (ref 70–99)

## 2011-07-23 LAB — PROTIME-INR: Prothrombin Time: 13.7 seconds (ref 11.6–15.2)

## 2011-07-23 SURGERY — LEFT HEART CATHETERIZATION WITH CORONARY ANGIOGRAM
Anesthesia: Moderate Sedation

## 2011-07-23 MED ORDER — NITROGLYCERIN 0.2 MG/ML ON CALL CATH LAB
INTRAVENOUS | Status: AC
  Filled 2011-07-23: qty 1

## 2011-07-23 MED ORDER — ASPIRIN EC 325 MG PO TBEC: 325.0000 mg | DELAYED_RELEASE_TABLET | Freq: Every day | ORAL | Status: DC

## 2011-07-23 MED ORDER — OXYCODONE-ACETAMINOPHEN 5-325 MG PO TABS: 1.0000 | ORAL_TABLET | ORAL | Status: DC | PRN

## 2011-07-23 MED ORDER — MIDAZOLAM HCL 2 MG/2ML IJ SOLN
INTRAMUSCULAR | Status: AC
  Filled 2011-07-23: qty 2

## 2011-07-23 MED ORDER — LIDOCAINE HCL (PF) 1 % IJ SOLN
INTRAMUSCULAR | Status: AC
  Filled 2011-07-23: qty 30

## 2011-07-23 MED ORDER — METFORMIN HCL 500 MG PO TABS
500.0000 mg | ORAL_TABLET | Freq: Two times a day (BID) | ORAL | Status: DC
  Filled 2011-07-23 (×3): qty 1

## 2011-07-23 MED ORDER — SODIUM CHLORIDE 0.9 % IJ SOLN
3.0000 mL | Freq: Two times a day (BID) | INTRAMUSCULAR | Status: DC
  Administered 2011-07-23 – 2011-07-24 (×2): 3 mL via INTRAVENOUS

## 2011-07-23 MED ORDER — HYDROCHLOROTHIAZIDE 12.5 MG PO CAPS
12.5000 mg | ORAL_CAPSULE | Freq: Every day | ORAL | Status: DC
  Administered 2011-07-24: 12.5 mg via ORAL
  Filled 2011-07-23: qty 1

## 2011-07-23 MED ORDER — ACETAMINOPHEN 325 MG PO TABS: 650.0000 mg | ORAL_TABLET | ORAL | Status: DC | PRN

## 2011-07-23 MED ORDER — SODIUM CHLORIDE 0.9 % IJ SOLN: 3.0000 mL | INTRAMUSCULAR | Status: DC | PRN

## 2011-07-23 MED ORDER — HEPARIN (PORCINE) IN NACL 2-0.9 UNIT/ML-% IJ SOLN
INTRAMUSCULAR | Status: AC
  Filled 2011-07-23: qty 2000

## 2011-07-23 MED ORDER — METOPROLOL TARTRATE 1 MG/ML IV SOLN
INTRAVENOUS | Status: AC
  Filled 2011-07-23: qty 5

## 2011-07-23 MED ORDER — SODIUM CHLORIDE 0.9 % IV SOLN: 250.0000 mL | INTRAVENOUS | Status: DC

## 2011-07-23 MED ORDER — SODIUM CHLORIDE 0.9 % IV SOLN: 1.0000 mL/kg/h | INTRAVENOUS | Status: DC

## 2011-07-23 MED ORDER — ONDANSETRON HCL 4 MG/2ML IJ SOLN: 4.0000 mg | Freq: Four times a day (QID) | INTRAMUSCULAR | Status: DC | PRN

## 2011-07-23 NOTE — Brief Op Note (Signed)
See operative note. Charlton Haws 2:27 PM 07/23/2011

## 2011-07-23 NOTE — Interval H&P Note (Signed)
History and Physical Interval Note:  07/23/2011 2:27 PM  Megan Schroeder  has presented today for surgery, with the diagnosis of chest pain  The various methods of treatment have been discussed with the patient and family. After consideration of risks, benefits and other options for treatment, the patient has consented to  Procedure(s): LEFT HEART CATHETERIZATION WITH CORONARY ANGIOGRAM as a surgical intervention .  The patients' history has been reviewed, patient examined, no change in status, stable for surgery.  I have reviewed the patients' chart and labs.  Questions were answered to the patient's satisfaction.     Charlton Haws  07/23/2011 2:27 PM

## 2011-07-23 NOTE — Op Note (Signed)
Catheterization  Indication: Chest Pain  Procedure: After informed consent and clinical "time out" the right groin was prepped and draped in a sterile fashion.  A 5Fr sheath was placed in the right femoral artery using seldinger technique and local lidocaine.  Standard JL4, JR4 and angled pigtail catheters were used to engage the coronary arteries.  Coronary arteries were visualized in orthogonal views using caudal and cranial angulation.  RAO ventriculography was done using 28 * cc of contrast.    Medications:   Versed: 3 mg's  Fentanyl: 0 ug's  Coronary Arteries: Right dominant with no anomalies  LM: normal  LAD:  30% proximal and mid.  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  Vessel size only about 1mm  D1: normal  D2: normal  Circumflex:  Normal  OM1: normal   RCA: Dominant and normal  Ventriculography: EF: 60 %, no RWMA;s  Hemodynamics:  Aortic Pressure: 127/60  mmHg  LV Pressure: 127/ 10  mmHg  Impression:  Films reviewed with Dr Clifton James Both agree that distal LAD too small to intervene on.  She has horrible control of her DM with A1c over 10 Medical Rx warranted.  R/O with no further pain in hospital.  Small hematoma in lab.  D/C in am with outpatient F/U in [place].  Discuss with primary Dr Janna Arch about referral to diabetes/endocrne specialist.  Her diet is a big issue  Charlton Haws 07/23/2011 2:57 PM

## 2011-07-23 NOTE — Progress Notes (Signed)
Cardiology Progress Note Patient Name: Megan Schroeder Date of Encounter: 07/23/2011, 7:37 AM     Subjective  No overnight events. Patient feeling well this morning. Denies any chest pain or shortness of breath.   Objective   Telemetry: Sinus rhythm 60s-80s  Medications: . amLODipine  10 mg Oral Daily  . aspirin  324 mg Oral Pre-Cath  . aspirin EC  325 mg Oral Daily  . enoxaparin  40 mg Subcutaneous Q24H  . hydrochlorothiazide  12.5 mg Oral Daily  . insulin aspart  0-5 Units Subcutaneous QHS  . insulin aspart  0-9 Units Subcutaneous TID WC  . insulin glargine  38 Units Subcutaneous QHS  . levothyroxine  75 mcg Oral QAC breakfast  . olmesartan  40 mg Oral Daily  . simvastatin  20 mg Oral q1800  . DISCONTD: metFORMIN  500 mg Oral BID WC   . sodium chloride 75 mL/hr at 07/23/11 0420    Physical Exam: Temp:  [98.2 F (36.8 C)] 98.2 F (36.8 C) (02/11 0600) Pulse Rate:  [63-88] 71  (02/11 0600) Resp:  [16-18] 16  (02/11 0600) BP: (95-146)/(67-81) 112/67 mmHg (02/11 0600) SpO2:  [94 %-97 %] 95 % (02/11 0600) Weight:  [167 lb 5.3 oz (75.9 kg)] 167 lb 5.3 oz (75.9 kg) (02/11 0600)  General: Overweight white female, in no acute distress. Head: Normocephalic, atraumatic, sclera non-icteric, nares are without discharge.  Neck: Supple. Negative for carotid bruits or JVD Lungs: Clear bilaterally to auscultation without wheezes, rales, or rhonchi. Breathing is unlabored. Heart: RRR S1 S2 without murmurs, rubs, or gallops.  Abdomen: Soft, non-tender, non-distended with normoactive bowel sounds. No rebound/guarding. No obvious abdominal masses. Msk:  Strength and tone appear normal for age. Extremities: No edema. No clubbing or cyanosis. Distal pedal pulses are 1+ and equal bilaterally. Neuro: Alert and oriented X 3. Moves all extremities spontaneously. Psych:  Responds to questions appropriately with a normal affect.   Intake/Output Summary (Last 24 hours) at 07/23/11  0737 Last data filed at 07/22/11 1738  Gross per 24 hour  Intake      3 ml  Output      0 ml  Net      3 ml    Labs:  BMET - Pending  Basename 07/22/11 0103 07/21/11 2040  NA -- 135  K -- 3.5  CL -- 96  CO2       -- 27  GLUCOSE -- 251*  BUN -- 10  CREATININE 0.51 0.56  CALCIUM -- 10.4  MG -- --  PHOS -- --   Basename 07/22/11 0103 07/21/11 2040  WBC 8.3 7.3  HGB 12.2 12.6  HCT 36.3 37.0  MCV 84.6 84.3  PLT 183 169   Basename 07/23/11 0303 07/22/11 1912 07/22/11 1120 07/21/11 2109  CKTOTAL 66 80 82 --  CKMB 1.9 2.1 2.5 --  TROPONINI <0.30 <0.30 <0.30 <0.30   Basename 07/22/11 0103  HGBA1C 10.2*   Basename 07/22/11 0103  TSH 4.440    07/21/2011 20:41  Pro B Natriuretic peptide (BNP) 16.2     07/23/2011 03:03  Prothrombin Time 13.7  INR 1.03     07/22/2011 01:03  Hemoglobin A1C 10.2 (H)     07/22/2011 01:03  TSH 4.440    Radiology/Studies:   07/21/2011 - CXR  Findings: Shallow inspiration with elevation of the right hemidiaphragm.  Heart size and pulmonary vascularity are normal. No focal airspace consolidation in the lungs.  No blunting of  costophrenic angles.  No pneumothorax.  No significant change since previous study.  IMPRESSION: No evidence of active pulmonary disease.     Assessment and Plan  66 y.o. female w/ PMHx significant for HTN, HLD, IDDM, and family hx CAD who presented to [facility] on 07/21/11 with complaints of chest pain.  1. Chest Pain: Presenting symptoms concerning for angina. Currently chest pain free. No acute EKG changes. Cardiac enzymes negative. CXR without acute cardiopulmonary findings. Echo pending. Plan for cath today. Cont ASA, CCB, ARB, statin.  2. HTN: BPs stable. Cont CCB, ARB. Hold HCTZ for cath today  3. HLD: Lipid panel and hepatic function panel in the am. Cont statin  4. Uncontrolled IDDM: A1C 10.2. Metformin on hold. Cont SSI and Lantus  VTE prophylaxis: Lovenox held today for cath. Please resume post  cath   Signed, Vinal Rosengrant PA-C

## 2011-07-23 NOTE — Progress Notes (Signed)
Patient examined chart reviewed.  Seen yesterday as new consult.  Poorly controlled diabetic.  Metformin held for cath.  Doubt she will get done soon enough to be D/C today if no critical  Disease found.  Discussed with primary Charlton Haws 9:22 AM 07/23/2011

## 2011-07-23 NOTE — Progress Notes (Signed)
07/23/2011 Jerred Zaremba [facility] Case Management Note [phone]  Utilization review completed.  

## 2011-07-23 NOTE — Progress Notes (Signed)
Subjective:   No further chest pains or headaches.   66 year old Caucasian female patient with history of hypertension, type 2 diabetes mellitus, hyperlipidemia, family history of coronary artery disease, physically quite active, presented with lower substernal 10 over 10 pressure-like chest pain which started approximately 6 PM on the night prior to admission, at rest. Was nonradiating and not associated with dyspnea or diaphoresis. TUMS did not help her. EMS was called and she received 2 nitroglycerin sprays on route to the ED which helped the pain but it eventually resolved after sublingual nitroglycerin tablet in the emergency department. She says the pain lasted between 6 PM and midnight.     Objective  Vital signs in last 24 hours: Filed Vitals:   07/22/11 1300 07/22/11 2100 07/23/11 0600 07/23/11 1001  BP: 95/67 136/71 112/67 126/81  Pulse: 63 88 71   Temp:  98.2 F (36.8 C) 98.2 F (36.8 C)   TempSrc: Oral     Resp: 18 18 16    Height:      Weight:   75.9 kg (167 lb 5.3 oz)   SpO2: 97% 94% 95%    Weight change: 1.057 kg (2 lb 5.3 oz)  Intake/Output Summary (Last 24 hours) at 07/23/11 1255 Last data filed at 07/23/11 0800  Gross per 24 hour  Intake    403 ml  Output      0 ml  Net    403 ml    Physical Exam:  General Exam: Comfortable.  Respiratory System: Clear. No increased work of breathing.  Cardiovascular System: First and second heart sounds heard. Regular rate and rhythm. No JVD/murmurs. Telemetry shows sinus rhythm. Gastrointestinal System: Abdomen is non distended, soft and normal bowel sounds heard.  Central Nervous System: Alert and oriented. No focal neurological deficits.  Labs:  Basic Metabolic Panel:  Lab 07/23/11 1610 07/22/11 0103 07/21/11 2040  NA 137 -- 135  K 3.7 -- 3.5  CL 99 -- 96  CO2 24 -- 27  GLUCOSE 247* -- 251*  BUN 10 -- 10  CREATININE 0.50 0.51 0.56  CALCIUM 9.2 -- 10.4  ALB -- -- --  PHOS -- -- --   Liver Function  Tests: No results found for this basename: AST:3,ALT:3,ALKPHOS:3,BILITOT:3,PROT:3,ALBUMIN:3 in the last 168 hours No results found for this basename: LIPASE:3,AMYLASE:3 in the last 168 hours No results found for this basename: AMMONIA:3 in the last 168 hours CBC:  Lab 07/22/11 0103 07/21/11 2040  WBC 8.3 7.3  NEUTROABS -- --  HGB 12.2 12.6  HCT 36.3 37.0  MCV 84.6 84.3  PLT 183 169   Cardiac Enzymes:  Lab 07/23/11 0303 07/22/11 1912 07/22/11 1120 07/21/11 2109  CKTOTAL 66 80 82 --  CKMB 1.9 2.1 2.5 --  CKMBINDEX -- -- -- --  TROPONINI <0.30 <0.30 <0.30 <0.30   CBG:  Lab 07/23/11 0731 07/22/11 2127 07/22/11 1702 07/22/11 1123 07/22/11 0733  GLUCAP 164* 194* 167* 210* 167*    Iron Studies: No results found for this basename: IRON,TIBC,TRANSFERRIN,FERRITIN in the last 72 hours Studies/Results: Chest Portable 1 View  07/21/2011  *RADIOLOGY REPORT*  Clinical Data: Headache, chest pain, and chest pressure  PORTABLE CHEST - 1 VIEW  Comparison: 09/25 of six  Findings: Shallow inspiration with elevation of the right hemidiaphragm.  Heart size and pulmonary vascularity are normal. No focal airspace consolidation in the lungs.  No blunting of costophrenic angles.  No pneumothorax.  No significant change since previous study.  IMPRESSION: No evidence of active pulmonary disease.  Original Report Authenticated By: Marlon Pel, M.D.   EKG interpretation by ED MD: unchanged from 06/14/08. No ST T changes Medications:    . sodium chloride 75 mL/hr at 07/23/11 0420      . amLODipine  10 mg Oral Daily  . aspirin  324 mg Oral Pre-Cath  . aspirin EC  325 mg Oral Daily  . aspirin  325 mg Oral STAT  . hydrochlorothiazide  12.5 mg Oral Daily  . insulin aspart  0-5 Units Subcutaneous QHS  . insulin aspart  0-9 Units Subcutaneous TID WC  . insulin glargine  38 Units Subcutaneous QHS  . levothyroxine  75 mcg Oral QAC breakfast  . olmesartan  40 mg Oral Daily  . simvastatin  20 mg Oral  q1800  . sodium chloride  3 mL Intravenous Q12H  . DISCONTD: enoxaparin  40 mg Subcutaneous Q24H  . DISCONTD: hydrochlorothiazide  12.5 mg Oral Daily    I  have reviewed scheduled and prn medications.     Problem/Plan: Principal Problem:  *Chest pain Active Problems:  Hypertension  Diabetes mellitus  Hyperlipidemia   1. Chest pain: Suspicious for angina in a patient with multiple risk factors for coronary artery disease including type 2 diabetes mellitus, hypertension, hyperlipidemia and family history. Negative cardiac enzymes. Continue aspirin and statins. Cardiology input appreciated. Patient is for cardiac catheterization today.  2. Headache: Unclear etiology. Resolved. 3. Uncontrolled type 2 diabetes mellitus: Continue home dose of Lantus. Metformin held. Continue sliding scale insulin. Hemoglobin A1c is 10.2. Patient will need tighter outpatient diabetes control. Reasonable inpatient control. 4. Hypertension: Controlled. Continue ARB, HCTZ and amlodipine. 5. Hyperlipidemia: Continue statins.  Disposition: Depending on coronary angiogram results. Discussed with spouse.  Grover Robinson 07/23/2011,12:55 PM  LOS: 2 days

## 2011-07-24 LAB — BASIC METABOLIC PANEL
BUN: 8 mg/dL (ref 6–23)
Chloride: 104 mEq/L (ref 96–112)
Creatinine, Ser: 0.51 mg/dL (ref 0.50–1.10)
GFR calc Af Amer: >90 mL/min (ref >90)
GFR calc non Af Amer: >90 mL/min (ref >90)

## 2011-07-24 LAB — CBC
HCT: 34.9 % — ABNORMAL LOW (ref 36.0–46.0)
MCHC: 33.2 g/dL (ref 30.0–36.0)
MCV: 84.9 fL (ref 78.0–100.0)
RDW: 13.6 % (ref 11.5–15.5)

## 2011-07-24 LAB — GLUCOSE, CAPILLARY: Glucose-Capillary: 166 mg/dL — ABNORMAL HIGH (ref 70–99)

## 2011-07-24 LAB — HEPATIC FUNCTION PANEL
ALT: 24 U/L (ref 0–35)
AST: 28 U/L (ref 0–37)
Albumin: 3.6 g/dL (ref 3.5–5.2)
Alkaline Phosphatase: 77 U/L (ref 39–117)
Total Protein: 6.7 g/dL (ref 6.0–8.3)

## 2011-07-24 LAB — LIPID PANEL
HDL: 42 mg/dL (ref >39)
Total CHOL/HDL Ratio: 3.5 RATIO

## 2011-07-24 MED ORDER — INSULIN ASPART 100 UNIT/ML ~~LOC~~ SOLN: 3.0000 [IU] | Freq: Three times a day (TID) | SUBCUTANEOUS | Status: DC

## 2011-07-24 MED ORDER — METFORMIN HCL 500 MG PO TABS: 500.0000 mg | ORAL_TABLET | Freq: Two times a day (BID) | ORAL | Status: DC

## 2011-07-24 MED ORDER — "INSULIN SYRINGE-NEEDLE U-100 29G X 1/2"" 1 ML MISC": 3.0000 [IU] | Freq: Three times a day (TID) | Status: DC

## 2011-07-24 MED ORDER — INSULIN GLARGINE 100 UNIT/ML ~~LOC~~ SOLN: 42.0000 [IU] | Freq: Every day | SUBCUTANEOUS | Status: DC

## 2011-07-24 MED ORDER — INSULIN PEN STARTER KIT
1.0000 | Freq: Once | Status: DC
  Filled 2011-07-24: qty 1

## 2011-07-24 NOTE — Discharge Summary (Signed)
Discharge Summary  Megan Schroeder MR#: [id number]  DOB:29-Apr-1946  Date of Admission: 07/21/2011 Date of Discharge: 07/24/2011  Patient's PCP: [name], MD, MD  Attending Physician:Gurinder Toral  Consults: 1. Cardiology: Wendall Stade, MD   Discharge Diagnoses: Principal Problem:  *Chest pain Active Problems:  Hypertension  Diabetes mellitus  Hyperlipidemia  Headache  anemia  Brief Admitting History and Physical 66 year old Caucasian female with history of hypertension, diabetes, hyperlipidemia, family history of coronary artery disease who is physically quite active and exercise daily presented with lower substernal chest pain which began acutely at rest on the night prior to admission. She was admitted for further evaluation and management.  Discharge Medications Current Discharge Medication List    START taking these medications   Details  insulin aspart (NOVOLOG) 100 UNIT/ML injection Inject 3 Units into the skin 3 (three) times daily with meals. Qty: 1 vial, Refills: 0    Insulin Syringe-Needle U-100 29G X 1/2" 1 ML MISC Inject 3 Units into the skin 3 (three) times daily with meals. Qty: 100 each, Refills: 1      CONTINUE these medications which have CHANGED   Details  insulin glargine (LANTUS) 100 UNIT/ML injection Inject 42 Units into the skin at bedtime.    metFORMIN (GLUCOPHAGE) 500 MG tablet Take 1 tablet (500 mg total) by mouth 2 (two) times daily with a meal.   Comments: Do not take until 07/26/2011. Can start back on 07/26/2011.      CONTINUE these medications which have NOT CHANGED   Details  amLODipine (NORVASC) 10 MG tablet Take 10 mg by mouth daily.    aspirin EC 81 MG tablet Take 81 mg by mouth daily.    atorvastatin (LIPITOR) 20 MG tablet Take 20 mg by mouth daily.    levothyroxine (SYNTHROID, LEVOTHROID) 75 MCG tablet Take 75 mcg by mouth daily.    olmesartan-hydrochlorothiazide (BENICAR HCT) 40-12.5 MG per tablet Take 1 tablet by  mouth daily.        Hospital Course: 1. Chest pain: Cardiac enzymes were cycled and negative. Given her multiple risk factors, cardiology was consulted. They performed cardiac catheterization which showed moderate distal LAD disease but they did not think that was the cause of her chest pain. They recommend medical treatment and continued aspirin, beta blockers and tight control of her diabetes. They cleared her for discharge. 2. Headache: Unclear etiology. Resolved. 3. Uncontrolled type 2 diabetes mellitus:  Lantus has been increased to 42 units at bedtime. Patient has been started on NovoLog 3 units subcutaneously 3 times a day with meals. Her metformin will be held for 2 days post cath and then can be resumed. Hemoglobin A1c is 10.2. Patient will need tighter outpatient diabetes control.  recommend outpatient endocrinology consultation. 4. Hypertension: Controlled.  5. Hyperlipidemia: Continue statins. 6. Anemia: Outpatient evaluation as deemed necessary.   Day of Discharge BP 110/65  Pulse 80  Temp(Src) 97.4 F (36.3 C) (Oral)  Resp 18  Ht 5\' 3"  (1.6 m)  Wt 76.3 kg (168 lb 3.4 oz)  BMI 29.80 kg/m2  SpO2 96%  General Exam: Comfortable.  Respiratory System: Clear. No increased work of breathing.  Cardiovascular System: First and second heart sounds heard. Regular rate and rhythm. No JVD/murmurs. Telemetry shows sinus rhythm.  Gastrointestinal System: Abdomen is non distended, soft and normal bowel sounds heard.  Central Nervous System: Alert and oriented. No focal neurological deficits  Results for orders placed during the hospital encounter of 07/21/11 (from the past 48 hour(s))  CARDIAC PANEL(CRET KIN+CKTOT+MB+TROPI)     Status: Normal   Collection Time   07/22/11  7:12 PM      Component Value Range Comment   Total CK 80  7 - 177 (U/L)    CK, MB 2.1  0.3 - 4.0 (ng/mL)    Troponin I <0.30  <0.30 (ng/mL)    Relative Index RELATIVE INDEX IS INVALID  0.0 - 2.5    GLUCOSE,  CAPILLARY     Status: Abnormal   Collection Time   07/22/11  9:27 PM      Component Value Range Comment   Glucose-Capillary 194 (*) 70 - 99 (mg/dL)   CARDIAC PANEL(CRET KIN+CKTOT+MB+TROPI)     Status: Normal   Collection Time   07/23/11  3:03 AM      Component Value Range Comment   Total CK 66  7 - 177 (U/L)    CK, MB 1.9  0.3 - 4.0 (ng/mL)    Troponin I <0.30  <0.30 (ng/mL)    Relative Index RELATIVE INDEX IS INVALID  0.0 - 2.5    PROTIME-INR     Status: Normal   Collection Time   07/23/11  3:03 AM      Component Value Range Comment   Prothrombin Time 13.7  11.6 - 15.2 (seconds)    INR 1.03  0.00 - 1.49    GLUCOSE, CAPILLARY     Status: Abnormal   Collection Time   07/23/11  7:31 AM      Component Value Range Comment   Glucose-Capillary 164 (*) 70 - 99 (mg/dL)   BASIC METABOLIC PANEL     Status: Abnormal   Collection Time   07/23/11  9:00 AM      Component Value Range Comment   Sodium 137  135 - 145 (mEq/L)    Potassium 3.7  3.5 - 5.1 (mEq/L)    Chloride 99  96 - 112 (mEq/L)    CO2 24  19 - 32 (mEq/L)    Glucose, Bld 247 (*) 70 - 99 (mg/dL)    BUN 10  6 - 23 (mg/dL)    Creatinine, Ser 4.09  0.50 - 1.10 (mg/dL)    Calcium 9.2  8.4 - 10.5 (mg/dL)    GFR calc non Af Amer >90  >90 (mL/min)    GFR calc Af Amer >90  >90 (mL/min)   GLUCOSE, CAPILLARY     Status: Abnormal   Collection Time   07/23/11  4:41 PM      Component Value Range Comment   Glucose-Capillary 164 (*) 70 - 99 (mg/dL)   GLUCOSE, CAPILLARY     Status: Abnormal   Collection Time   07/23/11  5:40 PM      Component Value Range Comment   Glucose-Capillary 149 (*) 70 - 99 (mg/dL)   GLUCOSE, CAPILLARY     Status: Abnormal   Collection Time   07/23/11  8:46 PM      Component Value Range Comment   Glucose-Capillary 206 (*) 70 - 99 (mg/dL)    Comment 1 Notify RN     HEPATIC FUNCTION PANEL     Status: Normal   Collection Time   07/24/11  5:16 AM      Component Value Range Comment   Total Protein 6.7  6.0 - 8.3 (g/dL)     Albumin 3.6  3.5 - 5.2 (g/dL)    AST 28  0 - 37 (U/L)    ALT 24  0 - 35 (U/L)  Alkaline Phosphatase 77  39 - 117 (U/L)    Total Bilirubin 0.3  0.3 - 1.2 (mg/dL)    Bilirubin, Direct <1.6  0.0 - 0.3 (mg/dL)    Indirect Bilirubin NOT CALCULATED  0.3 - 0.9 (mg/dL)   CBC     Status: Abnormal   Collection Time   07/24/11  5:16 AM      Component Value Range Comment   WBC 7.4  4.0 - 10.5 (K/uL)    RBC 4.11  3.87 - 5.11 (MIL/uL)    Hemoglobin 11.6 (*) 12.0 - 15.0 (g/dL)    HCT 10.9 (*) 60.4 - 46.0 (%)    MCV 84.9  78.0 - 100.0 (fL)    MCH 28.2  26.0 - 34.0 (pg)    MCHC 33.2  30.0 - 36.0 (g/dL)    RDW 54.0  98.1 - 19.1 (%)    Platelets 162  150 - 400 (K/uL)   BASIC METABOLIC PANEL     Status: Abnormal   Collection Time   07/24/11  5:16 AM      Component Value Range Comment   Sodium 140  135 - 145 (mEq/L)    Potassium 3.6  3.5 - 5.1 (mEq/L)    Chloride 104  96 - 112 (mEq/L)    CO2 24  19 - 32 (mEq/L)    Glucose, Bld 180 (*) 70 - 99 (mg/dL)    BUN 8  6 - 23 (mg/dL)    Creatinine, Ser 4.78  0.50 - 1.10 (mg/dL)    Calcium 9.1  8.4 - 10.5 (mg/dL)    GFR calc non Af Amer >90  >90 (mL/min)    GFR calc Af Amer >90  >90 (mL/min)   LIPID PANEL     Status: Normal   Collection Time   07/24/11  5:16 AM      Component Value Range Comment   Cholesterol 147  0 - 200 (mg/dL)    Triglycerides 295  <150 (mg/dL)    HDL 42  >62 (mg/dL)    Total CHOL/HDL Ratio 3.5      VLDL 29  0 - 40 (mg/dL)    LDL Cholesterol 76  0 - 99 (mg/dL)   GLUCOSE, CAPILLARY     Status: Abnormal   Collection Time   07/24/11  7:29 AM      Component Value Range Comment   Glucose-Capillary 166 (*) 70 - 99 (mg/dL)    Comment 1 Notify RN     GLUCOSE, CAPILLARY     Status: Abnormal   Collection Time   07/24/11 11:29 AM      Component Value Range Comment   Glucose-Capillary 181 (*) 70 - 99 (mg/dL)    Comment 1 Notify RN       Chest Portable 1 View  07/21/2011  *RADIOLOGY REPORT*  Clinical Data: Headache, chest pain, and  chest pressure  PORTABLE CHEST - 1 VIEW  Comparison: 09/25 of six  Findings: Shallow inspiration with elevation of the right hemidiaphragm.  Heart size and pulmonary vascularity are normal. No focal airspace consolidation in the lungs.  No blunting of costophrenic angles.  No pneumothorax.  No significant change since previous study.  IMPRESSION: No evidence of active pulmonary disease.  Original Report Authenticated By: Marlon Pel, M.D.    2-D echocardiogram:  Study Conclusions  - Left ventricle: The cavity size was normal. Wall thickness was increased in a pattern of mild LVH. Systolic function was normal. The estimated ejection fraction was in the  range of 55% to 60%. Although no diagnostic regional wall motion abnormality was identified, this possibility cannot be completely excluded on the basis of this study. Features are consistent with a pseudonormal left ventricular filling pattern, with concomitant abnormal relaxation and increased filling pressure (grade 2 diastolic dysfunction). - Aortic valve: There was no stenosis. - Mitral valve: No significant regurgitation. - Right ventricle: The cavity size was normal. Systolic function was normal. - Pulmonary arteries: No complete TR doppler jet so unable to estimate PA systolic pressure. - Inferior vena cava: The vessel was normal in size; the respirophasic diameter changes were in the normal range (= 50%); findings are consistent with normal central venous Pressure.  Impressions:  - Normal LV size with mild LV hypertrophy. EF 55-60%. Moderate diastolic dysfunction. Normal RV size and systolic function. No significant valvular abnormalities.    Disposition: Discharge home in stable condition.  Diet: Diabetic and Heart Healthy.  Activity: Increase activity gradually.  Follow-up Appts: Discharge Orders    Future Appointments: Provider: Department: Dept Phone: Center:   07/26/2011 10:30 AM [facility]  Mammography [phone] [facility]     Future Orders Please Complete By Expires   Diet - low sodium heart healthy      Diet Carb Modified      Increase activity slowly         TESTS THAT NEED FOLLOW-UP None  Time spent on discharge, talking to the patient, and coordinating care: 35 mins.  Recommend outpatient endocrinology consultation.   SignedMarcellus Scott, MD 07/24/2011, 5:38 PM

## 2011-07-24 NOTE — Progress Notes (Signed)
Reviewed discharge instructions with patient and husband, they stated their understanding.  Patient being discharged home on Insulin pens, she states she is familiar with pens and uses the lantus pen at home.  Reviewed when to give novolog insulin.  Insulin pen starter kit reviewed and given to patient.  IV's discontinued, caths intact.  Discharged home via wheelchairs and volunteer.  Colman Cater

## 2011-07-24 NOTE — Discharge Instructions (Signed)
Chest Pain (Nonspecific) Chest pain has many causes. Your pain could be caused by something serious, such as a heart attack or a blood clot in the lungs. It could also be caused by something less serious, such as a chest bruise or a virus. Follow up with your doctor. More lab tests or other studies may be needed to find the cause of your pain. Most of the time, nonspecific chest pain will improve within 2 to 3 days of rest and mild pain medicine. HOME CARE  For chest bruises, you may put ice on the sore area for 15 to 20 minutes, 3 to 4 times a day. Do this only if it makes you or your child feel better.   Put ice in a plastic bag.   Place a towel between the skin and the bag.   Rest for the next 2 to 3 days.   Go back to work if the pain improves.   See your doctor if the pain lasts longer than 1 to 2 weeks.   Only take medicine as told by your doctor.   Quit smoking if you smoke.  GET HELP RIGHT AWAY IF:   There is more pain or pain that spreads to the arm, neck, jaw, back, or belly (abdomen).   You or your child has shortness of breath.   You or your child coughs more than usual or coughs up blood.   You or your child has very bad back or belly pain, feels sick to his or her stomach (nauseous), or throws up (vomits).   You or your child has very bad weakness.   You or your child passes out (faints).   You or your child has a temperature by mouth above 102 F (38.9 C), not controlled by medicine.  MAKE SURE YOU:   Understand these instructions.   Will watch this condition.   Will get help right away if you or your child is not doing well or gets worse.  Document Released: 11/14/2007 Document Revised: 02/07/2011 Document Reviewed: 11/14/2007 [facility] Patient Information 2012 [place], [place].Chest Pain (Nonspecific) Chest pain has many causes. Your pain could be caused by something serious, such as a heart attack or a blood clot in the lungs. It could also be caused by  something less serious, such as a chest bruise or a virus. Follow up with your doctor. More lab tests or other studies may be needed to find the cause of your pain. Most of the time, nonspecific chest pain will improve within 2 to 3 days of rest and mild pain medicine. HOME CARE  For chest bruises, you may put ice on the sore area for 15 to 20 minutes, 3 to 4 times a day. Do this only if it makes you or your child feel better.   Put ice in a plastic bag.   Place a towel between the skin and the bag.   Rest for the next 2 to 3 days.   Go back to work if the pain improves.   See your doctor if the pain lasts longer than 1 to 2 weeks.   Only take medicine as told by your doctor.   Quit smoking if you smoke.  GET HELP RIGHT AWAY IF:   There is more pain or pain that spreads to the arm, neck, jaw, back, or belly (abdomen).   You or your child has shortness of breath.   You or your child coughs more than usual or coughs up blood.  You or your child has very bad back or belly pain, feels sick to his or her stomach (nauseous), or throws up (vomits).   You or your child has very bad weakness.   You or your child passes out (faints).   You or your child has a temperature by mouth above 102 F (38.9 C), not controlled by medicine.  MAKE SURE YOU:   Understand these instructions.   Will watch this condition.   Will get help right away if you or your child is not doing well or gets worse.  Document Released: 11/14/2007 Document Revised: 02/07/2011 Document Reviewed: 11/14/2007 [facility] Patient Information 2012 [place], [place].

## 2011-07-24 NOTE — Progress Notes (Signed)
Patient s/p cardiac cath.  A1C 10.2% (07/22/11).  Poorly controlled CBGs at home.    Home DM medications include: Lantus 38 units QHS + Metformin 500 mg bid.  Fasting CBG 02/10: 167 mg/dl Fasting CBG 16/10: 960 mg/dl Fasting CBG 45/40: 981 mg/dl  Mild hyperglycemia in the morning on home dose Lantus and Metformin.    Recommend the following: 1. Increase Lantus to 42 units QHS (20% increase) 2. Add Novolog meal coverage- Novolog 3 units tid with meals  *MD, may want to consider sending patient home on Novolog with meals OR could increase Metformin to 1000 mg bid since A1C was 10.2%.  Will follow.  Ambrose Finland RN, MSN, CDE Diabetes Coordinator Inpatient Diabetes Program [phone]

## 2011-07-24 NOTE — Progress Notes (Signed)
@ Subjective:  Denies SSCP, palpitations or Dyspnea   Objective:  Filed Vitals:   07/23/11 1730 07/23/11 2100 07/24/11 0000 07/24/11 0400  BP: 133/71 128/68 126/80 126/82  Pulse: 75 80 77 70  Temp:  97.9 F (36.6 C) 98 F (36.7 C) 97.7 F (36.5 C)  TempSrc:  Oral Oral Oral  Resp:  18 18 18   Height:      Weight:    76.3 kg (168 lb 3.4 oz)  SpO2:  96% 94% 97%    Intake/Output from previous day:  Intake/Output Summary (Last 24 hours) at 07/24/11 0813 Last data filed at 07/24/11 0100  Gross per 24 hour  Intake    240 ml  Output      0 ml  Net    240 ml    Physical Exam: Affect appropriate Healthy:  appears stated age HEENT: normal Neck supple with no adenopathy JVP normal no bruits no thyromegaly Lungs clear with no wheezing and good diaphragmatic motion Heart:  S1/S2 no murmur, no rub, gallop or click PMI normal Abdomen: benighn, BS positve, no tenderness, no AAA no bruit.  No HSM or HJR Distal pulses intact with no bruits No edema Neuro non-focal Skin warm and dry No muscular weakness Cath site A no hematoma   Lab Results: Basic Metabolic Panel:  Basename 07/24/11 0516 07/23/11 0900  NA 140 137  K 3.6 3.7  CL 104 99  CO2 24 24  GLUCOSE 180* 247*  BUN 8 10  CREATININE 0.51 0.50  CALCIUM 9.1 9.2  MG -- --  PHOS -- --   Liver Function Tests:  [facility] 07/24/11 0516  AST 28  ALT 24  ALKPHOS 77  BILITOT 0.3  PROT 6.7  ALBUMIN 3.6   No results found for this basename: LIPASE:2,AMYLASE:2 in the last 72 hours CBC:  Basename 07/24/11 0516 07/22/11 0103  WBC 7.4 8.3  NEUTROABS -- --  HGB 11.6* 12.2  HCT 34.9* 36.3  MCV 84.9 84.6  PLT 162 183   Cardiac Enzymes:  Basename 07/23/11 0303 07/22/11 1912 07/22/11 1120  CKTOTAL 66 80 82  CKMB 1.9 2.1 2.5  CKMBINDEX -- -- --  TROPONINI <0.30 <0.30 <0.30   BNP: No components found with this basename: POCBNP:3 D-Dimer: No results found for this basename: DDIMER:2 in the last 72  hours Hemoglobin A1C:  Basename 07/22/11 0103  HGBA1C 10.2*   Fasting Lipid Panel:  Basename 07/24/11 0516  CHOL 147  HDL 42  LDLCALC 76  TRIG 147  CHOLHDL 3.5  LDLDIRECT --   Thyroid Function Tests:  Basename 07/22/11 0103  TSH 4.440  T4TOTAL --  T3FREE --  THYROIDAB --   Anemia Panel: No results found for this basename: VITAMINB12,FOLATE,FERRITIN,TIBC,IRON,RETICCTPCT in the last 72 hours  Imaging: No results found.  Cardiac Studies:  ECG:  NSR no acute St/T wave changes   Telemetry: NSR no arrhythmia  Echo:   Medications:     . amLODipine  10 mg Oral Daily  . aspirin EC  325 mg Oral Daily  . aspirin EC  325 mg Oral Daily  . heparin      . hydrochlorothiazide  12.5 mg Oral Daily  . insulin aspart  0-5 Units Subcutaneous QHS  . insulin aspart  0-9 Units Subcutaneous TID WC  . insulin glargine  38 Units Subcutaneous QHS  . levothyroxine  75 mcg Oral QAC breakfast  . lidocaine      . metFORMIN  500 mg Oral BID WC  .  metoprolol      . midazolam      . midazolam      . nitroGLYCERIN      . olmesartan  40 mg Oral Daily  . simvastatin  20 mg Oral q1800  . sodium chloride  3 mL Intravenous Q12H  . DISCONTD: enoxaparin  40 mg Subcutaneous Q24H  . DISCONTD: hydrochlorothiazide  12.5 mg Oral Daily  . DISCONTD: sodium chloride  3 mL Intravenous Q12H       . sodium chloride 1 mL/kg/hr (07/23/11 1532)  . sodium chloride    . DISCONTD: sodium chloride 75 mL/hr at 07/23/11 1610    Assessment/Plan:  Chest Pain:  Moderate distal LAD disease not likely cause of pain.  Medical Rx ASA and beta blocker DM:  Poorly controlled.  Should have lantus adjusted before D/C ? Bid dosing.  ? Add [place].  Encouraged Her to have Dr Janna Arch refer to endocrine ? Sharl Ma.  Ok to D/C home after DM issues clarified  Megan Schroeder 07/24/2011, 8:13 AM

## 2011-07-26 ENCOUNTER — Ambulatory Visit
Admission: RE | Admit: 2011-07-26 | Discharge: 2011-07-26 | Disposition: A | Discharge status: Home / Self Care | Payer: Medicare Other | Source: Ambulatory Visit | Attending: Family Medicine | Admitting: Family Medicine

## 2011-07-26 DIAGNOSIS — Z1231 Encounter for screening mammogram for malignant neoplasm of breast: Secondary | ICD-10-CM

## 2012-07-29 ENCOUNTER — Emergency Department (HOSPITAL_COMMUNITY): Payer: Medicare Other

## 2012-07-29 ENCOUNTER — Encounter (HOSPITAL_COMMUNITY): Payer: Self-pay

## 2012-07-29 ENCOUNTER — Emergency Department (HOSPITAL_COMMUNITY)
Admission: EM | Admit: 2012-07-29 | Discharge: 2012-07-29 | Disposition: A | Discharge status: Home / Self Care | Payer: Medicare Other | Attending: Emergency Medicine | Admitting: Emergency Medicine

## 2012-07-29 DIAGNOSIS — R0602 Shortness of breath: Secondary | ICD-10-CM | POA: Insufficient documentation

## 2012-07-29 DIAGNOSIS — K30 Functional dyspepsia: Secondary | ICD-10-CM

## 2012-07-29 DIAGNOSIS — R197 Diarrhea, unspecified: Secondary | ICD-10-CM | POA: Insufficient documentation

## 2012-07-29 DIAGNOSIS — E876 Hypokalemia: Secondary | ICD-10-CM

## 2012-07-29 DIAGNOSIS — Z794 Long term (current) use of insulin: Secondary | ICD-10-CM | POA: Insufficient documentation

## 2012-07-29 DIAGNOSIS — I10 Essential (primary) hypertension: Secondary | ICD-10-CM | POA: Insufficient documentation

## 2012-07-29 DIAGNOSIS — R6883 Chills (without fever): Secondary | ICD-10-CM | POA: Insufficient documentation

## 2012-07-29 DIAGNOSIS — E119 Type 2 diabetes mellitus without complications: Secondary | ICD-10-CM | POA: Insufficient documentation

## 2012-07-29 DIAGNOSIS — K3189 Other diseases of stomach and duodenum: Secondary | ICD-10-CM | POA: Insufficient documentation

## 2012-07-29 DIAGNOSIS — E785 Hyperlipidemia, unspecified: Secondary | ICD-10-CM | POA: Insufficient documentation

## 2012-07-29 DIAGNOSIS — Z7982 Long term (current) use of aspirin: Secondary | ICD-10-CM | POA: Insufficient documentation

## 2012-07-29 DIAGNOSIS — R059 Cough, unspecified: Secondary | ICD-10-CM | POA: Insufficient documentation

## 2012-07-29 DIAGNOSIS — R5381 Other malaise: Secondary | ICD-10-CM | POA: Insufficient documentation

## 2012-07-29 DIAGNOSIS — F411 Generalized anxiety disorder: Secondary | ICD-10-CM | POA: Insufficient documentation

## 2012-07-29 DIAGNOSIS — E079 Disorder of thyroid, unspecified: Secondary | ICD-10-CM | POA: Insufficient documentation

## 2012-07-29 DIAGNOSIS — Z79899 Other long term (current) drug therapy: Secondary | ICD-10-CM | POA: Insufficient documentation

## 2012-07-29 DIAGNOSIS — R05 Cough: Secondary | ICD-10-CM | POA: Insufficient documentation

## 2012-07-29 DIAGNOSIS — R1013 Epigastric pain: Secondary | ICD-10-CM | POA: Insufficient documentation

## 2012-07-29 DIAGNOSIS — J189 Pneumonia, unspecified organism: Secondary | ICD-10-CM

## 2012-07-29 LAB — URINE MICROSCOPIC-ADD ON

## 2012-07-29 LAB — URINALYSIS, ROUTINE W REFLEX MICROSCOPIC
Bilirubin Urine: NEGATIVE
Ketones, ur: NEGATIVE mg/dL
Nitrite: NEGATIVE
Urobilinogen, UA: 0.2 mg/dL (ref 0.0–1.0)
pH: 6 (ref 5.0–8.0)

## 2012-07-29 LAB — HEPATIC FUNCTION PANEL
ALT: 18 U/L (ref 0–35)
AST: 17 U/L (ref 0–37)
Alkaline Phosphatase: 88 U/L (ref 39–117)
Bilirubin, Direct: 0.1 mg/dL (ref 0.0–0.3)
Indirect Bilirubin: 0.3 mg/dL (ref 0.3–0.9)

## 2012-07-29 LAB — CBC WITH DIFFERENTIAL/PLATELET
HCT: 34.7 % — ABNORMAL LOW (ref 36.0–46.0)
Hemoglobin: 12 g/dL (ref 12.0–15.0)
Lymphocytes Relative: 37 % (ref 12–46)
Lymphs Abs: 2.2 10*3/uL (ref 0.7–4.0)
Monocytes Absolute: 0.5 10*3/uL (ref 0.1–1.0)
Monocytes Relative: 8 % (ref 3–12)
Neutro Abs: 3.2 10*3/uL (ref 1.7–7.7)
Neutrophils Relative %: 53 % (ref 43–77)
RBC: 4.29 MIL/uL (ref 3.87–5.11)
WBC: 6 10*3/uL (ref 4.0–10.5)

## 2012-07-29 LAB — BASIC METABOLIC PANEL
BUN: 6 mg/dL (ref 6–23)
CO2: 29 mEq/L (ref 19–32)
Chloride: 95 mEq/L — ABNORMAL LOW (ref 96–112)
Creatinine, Ser: 0.5 mg/dL (ref 0.50–1.10)
Potassium: 3.2 mEq/L — ABNORMAL LOW (ref 3.5–5.1)

## 2012-07-29 MED ORDER — IOHEXOL 350 MG/ML SOLN
100.0000 mL | Freq: Once | INTRAVENOUS | Status: AC | PRN
  Administered 2012-07-29: 100 mL via INTRAVENOUS

## 2012-07-29 MED ORDER — POTASSIUM CHLORIDE CRYS ER 20 MEQ PO TBCR
40.0000 meq | EXTENDED_RELEASE_TABLET | Freq: Once | ORAL | Status: AC
  Administered 2012-07-29: 40 meq via ORAL
  Filled 2012-07-29: qty 1

## 2012-07-29 MED ORDER — FAMOTIDINE 20 MG PO TABS
20.0000 mg | ORAL_TABLET | Freq: Once | ORAL | Status: AC
  Administered 2012-07-29: 20 mg via ORAL
  Filled 2012-07-29: qty 1

## 2012-07-29 MED ORDER — ALBUTEROL SULFATE (2.5 MG/3ML) 0.083% IN NEBU: 2.5000 mg | INHALATION_SOLUTION | RESPIRATORY_TRACT | Status: DC | PRN

## 2012-07-29 MED ORDER — CIPROFLOXACIN HCL 250 MG PO TABS
500.0000 mg | ORAL_TABLET | Freq: Once | ORAL | Status: AC
  Administered 2012-07-29: 500 mg via ORAL
  Filled 2012-07-29: qty 2

## 2012-07-29 MED ORDER — PREDNISONE 50 MG PO TABS
60.0000 mg | ORAL_TABLET | Freq: Once | ORAL | Status: AC
  Administered 2012-07-29: 60 mg via ORAL
  Filled 2012-07-29: qty 1

## 2012-07-29 MED ORDER — HYDROCOD POLST-CHLORPHEN POLST 10-8 MG/5ML PO LQCR
5.0000 mL | Freq: Once | ORAL | Status: AC
  Administered 2012-07-29: 5 mL via ORAL
  Filled 2012-07-29: qty 5

## 2012-07-29 MED ORDER — AZITHROMYCIN 250 MG PO TABS: ORAL_TABLET | ORAL | Status: DC

## 2012-07-29 MED ORDER — SODIUM CHLORIDE 0.9 % IV SOLN
1000.0000 mL | Freq: Once | INTRAVENOUS | Status: AC
  Administered 2012-07-29: 1000 mL via INTRAVENOUS

## 2012-07-29 MED ORDER — SODIUM CHLORIDE 0.9 % IV SOLN
1000.0000 mL | INTRAVENOUS | Status: DC
  Administered 2012-07-29 (×2): 1000 mL via INTRAVENOUS

## 2012-07-29 MED ORDER — AZITHROMYCIN 250 MG PO TABS
500.0000 mg | ORAL_TABLET | Freq: Once | ORAL | Status: AC
  Administered 2012-07-29: 500 mg via ORAL
  Filled 2012-07-29: qty 2

## 2012-07-29 MED ORDER — HYDROCOD POLST-CHLORPHEN POLST 10-8 MG/5ML PO LQCR: 5.0000 mL | Freq: Two times a day (BID) | ORAL | Status: DC | PRN

## 2012-07-29 MED ORDER — ALBUTEROL SULFATE HFA 108 (90 BASE) MCG/ACT IN AERS
2.0000 | INHALATION_SPRAY | RESPIRATORY_TRACT | Status: DC
  Administered 2012-07-29: 2 via RESPIRATORY_TRACT
  Filled 2012-07-29: qty 6.7

## 2012-07-29 MED ORDER — GI COCKTAIL ~~LOC~~
30.0000 mL | Freq: Once | ORAL | Status: AC
  Administered 2012-07-29: 30 mL via ORAL
  Filled 2012-07-29: qty 30

## 2012-07-29 NOTE — ED Notes (Signed)
Pt alert & oriented x4, stable gait. Patient given discharge instructions, paperwork & prescription(s). Patient  instructed to stop at the registration desk to finish any additional paperwork. Patient verbalized understanding. Pt left department w/ no further questions. 

## 2012-07-29 NOTE — ED Notes (Signed)
Pt reports diarrhea for 1 1/2 weeks, no energy and indigestion. Thought was the flu. Thinks potassium may be low

## 2012-07-29 NOTE — ED Provider Notes (Signed)
History     CSN: [id number]  Arrival date & time 07/29/12  1603   First MD Initiated Contact with Patient 07/29/12 1643      Chief Complaint  Patient presents with  . Diarrhea    (Consider location/radiation/quality/duration/timing/severity/associated sxs/prior treatment) HPI Comments: Patient is a 67 year old female who has a history of hyperlipidemia diabetes mellitus and hypertension. The patient presents to the emergency department tonight because of 1-1-1/2 weeks of diarrhea. The patient states that she is having 3-4 episodes of diarrhea per day. She denies any blood in the diarrhea. She states she has had some chills but has not measured a temperature elevation. Patient states that she feels she is" weak as water. She has" no energy". The patient states that she has been having increased indigestion. She feels that this is because she has not been eating like usual. Patient denies chest pain, sweats, or syncopal episodes. The patient does state however that she has been having more easy fatigue with getting around the home and walking out to her mailbox than usual. Patient is concerned for her potassium, and she felt that the last time she had these symptoms her potassium was low. Patient has not taken any medications for the symptoms at this time.  The history is provided by the patient and the spouse.    Past Medical History  Diagnosis Date  . Diabetes mellitus   . Hypertension   . Thyroid disease   . Hyperlipidemia     History reviewed. No pertinent past surgical history.  No family history on file.  History  Substance Use Topics  . Smoking status: Never Smoker   . Smokeless tobacco: Never Used  . Alcohol Use: No    OB History   Grav Para Term Preterm Abortions TAB SAB Ect Mult Living                  Review of Systems  Constitutional: Positive for chills, activity change, appetite change and fatigue.       All ROS Neg except as noted in HPI  HENT: Negative  for nosebleeds and neck pain.   Eyes: Negative for photophobia and discharge.  Respiratory: Positive for cough and shortness of breath. Negative for chest tightness and wheezing.   Cardiovascular: Negative for chest pain, palpitations and leg swelling.  Gastrointestinal: Negative for abdominal pain and blood in stool.       Indigestion  Genitourinary: Negative for dysuria, frequency, hematuria and difficulty urinating.  Musculoskeletal: Negative for back pain and arthralgias.  Skin: Negative.  Negative for rash.  Neurological: Negative for dizziness, seizures and speech difficulty.  Psychiatric/Behavioral: Negative for hallucinations and confusion. The patient is nervous/anxious.     Allergies  Penicillins  Home Medications   Current Outpatient Rx  Name  Route  Sig  Dispense  Refill  . amLODipine (NORVASC) 10 MG tablet   Oral   Take 10 mg by mouth every evening.          Marland Kitchen aspirin EC 81 MG tablet   Oral   Take 81 mg by mouth every evening.          Marland Kitchen atorvastatin (LIPITOR) 20 MG tablet   Oral   Take 20 mg by mouth every evening.          . cholecalciferol (VITAMIN D) 1000 UNITS tablet   Oral   Take 1,000 Units by mouth every morning.         . fish oil-omega-3 fatty acids 1000  MG capsule   Oral   Take 1 g by mouth every morning.         . insulin glargine (LANTUS) 100 UNIT/ML injection   Subcutaneous   Inject 48 Units into the skin at bedtime.         Marland Kitchen levothyroxine (SYNTHROID, LEVOTHROID) 75 MCG tablet   Oral   Take 75 mcg by mouth every morning.          . metFORMIN (GLUCOPHAGE) 500 MG tablet   Oral   Take 1 tablet (500 mg total) by mouth 2 (two) times daily with a meal.           Do not take until 07/26/2011. Can start back on 2/1 ...   . olmesartan-hydrochlorothiazide (BENICAR HCT) 40-12.5 MG per tablet   Oral   Take 1 tablet by mouth every morning.            BP 141/124  Pulse 97  Temp(Src) 98.2 F (36.8 C) (Oral)  Resp 20  Ht 5'  4" (1.626 m)  SpO2 100%  Physical Exam  Nursing note and vitals reviewed. Constitutional: She is oriented to person, place, and time. She appears well-developed and well-nourished.  Non-toxic appearance.  HENT:  Head: Normocephalic.  Right Ear: Tympanic membrane and external ear normal.  Left Ear: Tympanic membrane and external ear normal.  Eyes: EOM and lids are normal. Pupils are equal, round, and reactive to light.  Neck: Normal range of motion. Neck supple. Carotid bruit is not present. No tracheal deviation present.  Cardiovascular: Regular rhythm, normal heart sounds, intact distal pulses and normal pulses.  Tachycardia present.   Pulmonary/Chest: No respiratory distress. She has wheezes.  Few scattered rhonchi bilaterally. Mild-to-moderate end-expiratory wheeze present. Patient speaks in complete sentences.  Abdominal: Soft. Bowel sounds are normal. There is no tenderness. There is no guarding.  Musculoskeletal: Normal range of motion. She exhibits no edema and no tenderness.  Negative Homans sign  Lymphadenopathy:       Head (right side): No submandibular adenopathy present.       Head (left side): No submandibular adenopathy present.    She has no cervical adenopathy.  Neurological: She is alert and oriented to person, place, and time. She has normal strength. No cranial nerve deficit or sensory deficit.  Skin: Skin is warm and dry. No rash noted.  Psychiatric: Her speech is normal. Her mood appears anxious.    ED Course  Procedures (including critical care time)  Labs Reviewed  CBC WITH DIFFERENTIAL - Abnormal; Notable for the following:    HCT 34.7 (*)    All other components within normal limits  BASIC METABOLIC PANEL - Abnormal; Notable for the following:    Potassium 3.2 (*)    Chloride 95 (*)    Glucose, Bld 170 (*)    All other components within normal limits  STOOL CULTURE  HEPATIC FUNCTION PANEL  URINALYSIS, ROUTINE W REFLEX MICROSCOPIC   pulse oximetry  100% oral air at rest. Within normal limits by my interpretation. No results found. EKG 18:34. Rate 95. Rhythm normal sinus. PR-normal. QRS low voltage. Axis normal. ST normal. EKG unchanged from 07/22/2011.No STEMI.  No diagnosis found.    MDM  I have reviewed nursing notes, vital signs, and all appropriate lab and imaging results for this patient. Pulse ox dropped to 84 when walking to the bath room. O2 started. Pt tolerating IV fluids without problem. Pt now reports she gets "winded" when walking even short distances. Potassium  low at 3.2. Oral potassium given. No episodes of diarrhea while in the emergency department.  On 2 L of oxygen the patient's pulse oximetry remains at 96-98. Patient speaking in complete sentences without problem.  The troponin is normal at less than 0.30. The urine analysis is normal. The basic metabolic panel shows potassium of 3.2, chloride of 95, glucose elevated at 170. Patient was given potassium 40 mEq in the emergency department. Complete blood count is within normal limits. Chest x-ray shows increased interstitial markings in both lungs possibly reflecting a viral or atypical respiratory infection. There was no focal pneumonia.  Given the patient's shortness of breath and change in pulse oximetry it was felt that the patient should have a CT scan to rule out the possibility of pulmonary embolism. CT scan revealed bilateral small peripheral nodular airspace opacities that were nonspecific. This raised the question of an infectious versus inflammatory process. But no pulmonary embolism.  Case reviewed by Dr. Rosalia Hammers.  Case discussed with patient's primary care physician. He agrees to see the patient for recheck on Thursday every 20. The patient will be discharged with albuterol nebulizer treatments, Prednisone.  Zithromax 250 mg daily. Tussionex for cough every 12 hours. Patient to return to the emergency department if any changes, problems, or concerns.      Kathie Dike, [place] 07/29/12 [phone]

## 2012-07-29 NOTE — ED Notes (Signed)
Patient transported to CT 

## 2012-07-29 NOTE — ED Provider Notes (Signed)
History/physical exam/procedure(s) were performed by non-physician practitioner and as supervising physician I was immediately available for consultation/collaboration. I have reviewed all notes and am in agreement with care and plan.   Cleatus Goodin S Johnni Wunschel, MD 07/29/12 2347 

## 2012-07-29 NOTE — ED Notes (Signed)
PA advises pt complains of being SOB when moving. Pt states has been going on for awhile. States when she gets SOB at home she stops & sits down then starts feeling better.

## 2013-10-01 ENCOUNTER — Encounter (HOSPITAL_COMMUNITY): Payer: Self-pay | Admitting: Emergency Medicine

## 2013-10-01 ENCOUNTER — Emergency Department (HOSPITAL_COMMUNITY)
Admission: EM | Admit: 2013-10-01 | Discharge: 2013-10-02 | Disposition: A | Discharge status: Home / Self Care | Payer: Medicare Other | Attending: Emergency Medicine | Admitting: Emergency Medicine

## 2013-10-01 DIAGNOSIS — E785 Hyperlipidemia, unspecified: Secondary | ICD-10-CM | POA: Insufficient documentation

## 2013-10-01 DIAGNOSIS — Z79899 Other long term (current) drug therapy: Secondary | ICD-10-CM | POA: Insufficient documentation

## 2013-10-01 DIAGNOSIS — E119 Type 2 diabetes mellitus without complications: Secondary | ICD-10-CM | POA: Insufficient documentation

## 2013-10-01 DIAGNOSIS — I10 Essential (primary) hypertension: Secondary | ICD-10-CM | POA: Insufficient documentation

## 2013-10-01 DIAGNOSIS — E079 Disorder of thyroid, unspecified: Secondary | ICD-10-CM | POA: Insufficient documentation

## 2013-10-01 DIAGNOSIS — Z7982 Long term (current) use of aspirin: Secondary | ICD-10-CM | POA: Insufficient documentation

## 2013-10-01 DIAGNOSIS — Z794 Long term (current) use of insulin: Secondary | ICD-10-CM | POA: Insufficient documentation

## 2013-10-01 DIAGNOSIS — Z880 Allergy status to penicillin: Secondary | ICD-10-CM | POA: Insufficient documentation

## 2013-10-01 DIAGNOSIS — N390 Urinary tract infection, site not specified: Secondary | ICD-10-CM | POA: Insufficient documentation

## 2013-10-01 DIAGNOSIS — D72829 Elevated white blood cell count, unspecified: Secondary | ICD-10-CM | POA: Insufficient documentation

## 2013-10-01 LAB — CBC WITH DIFFERENTIAL/PLATELET
Basophils Absolute: 0 10*3/uL (ref 0.0–0.1)
Basophils Relative: 0 % (ref 0–1)
Eosinophils Absolute: 0 10*3/uL (ref 0.0–0.7)
Eosinophils Relative: 0 % (ref 0–5)
HCT: 35.8 % — ABNORMAL LOW (ref 36.0–46.0)
HEMOGLOBIN: 12 g/dL (ref 12.0–15.0)
LYMPHS ABS: 2 10*3/uL (ref 0.7–4.0)
LYMPHS PCT: 14 % (ref 12–46)
MCH: 28 pg (ref 26.0–34.0)
MCHC: 33.5 g/dL (ref 30.0–36.0)
MCV: 83.6 fL (ref 78.0–100.0)
MONOS PCT: 6 % (ref 3–12)
Monocytes Absolute: 0.8 10*3/uL (ref 0.1–1.0)
NEUTROS PCT: 80 % — AB (ref 43–77)
Neutro Abs: 11 10*3/uL — ABNORMAL HIGH (ref 1.7–7.7)
PLATELETS: 204 10*3/uL (ref 150–400)
RBC: 4.28 MIL/uL (ref 3.87–5.11)
RDW: 13.6 % (ref 11.5–15.5)
WBC: 13.9 10*3/uL — AB (ref 4.0–10.5)

## 2013-10-01 LAB — BASIC METABOLIC PANEL
BUN: 10 mg/dL (ref 6–23)
CHLORIDE: 93 meq/L — AB (ref 96–112)
CO2: 25 meq/L (ref 19–32)
Calcium: 9.7 mg/dL (ref 8.4–10.5)
Creatinine, Ser: 0.57 mg/dL (ref 0.50–1.10)
GFR calc Af Amer: >90 mL/min (ref >90)
GFR calc non Af Amer: >90 mL/min (ref >90)
GLUCOSE: 207 mg/dL — AB (ref 70–99)
POTASSIUM: 3.8 meq/L (ref 3.7–5.3)
SODIUM: 134 meq/L — AB (ref 137–147)

## 2013-10-01 LAB — URINE MICROSCOPIC-ADD ON

## 2013-10-01 LAB — URINALYSIS, ROUTINE W REFLEX MICROSCOPIC
Glucose, UA: 100 mg/dL — AB
NITRITE: POSITIVE — AB
SPECIFIC GRAVITY, URINE: 1.025 (ref 1.005–1.030)
UROBILINOGEN UA: 4 mg/dL — AB (ref 0.0–1.0)
pH: 5.5 (ref 5.0–8.0)

## 2013-10-01 LAB — LACTIC ACID, PLASMA: Lactic Acid, Venous: 1.6 mmol/L (ref 0.5–2.2)

## 2013-10-01 LAB — CBG MONITORING, ED: GLUCOSE-CAPILLARY: 180 mg/dL — AB (ref 70–99)

## 2013-10-01 MED ORDER — HYDROCODONE-ACETAMINOPHEN 5-325 MG PO TABS
1.0000 | ORAL_TABLET | Freq: Once | ORAL | Status: AC
  Administered 2013-10-01: 1 via ORAL
  Filled 2013-10-01: qty 1

## 2013-10-01 MED ORDER — ONDANSETRON HCL 4 MG PO TABS
4.0000 mg | ORAL_TABLET | Freq: Once | ORAL | Status: AC
  Administered 2013-10-01: 4 mg via ORAL
  Filled 2013-10-01: qty 1

## 2013-10-01 MED ORDER — SODIUM CHLORIDE 0.9 % IV SOLN: 1000.0000 mL | INTRAVENOUS | Status: DC

## 2013-10-01 MED ORDER — SODIUM CHLORIDE 0.9 % IV SOLN
1000.0000 mL | Freq: Once | INTRAVENOUS | Status: AC
  Administered 2013-10-01: 1000 mL via INTRAVENOUS

## 2013-10-01 MED ORDER — CIPROFLOXACIN IN D5W 400 MG/200ML IV SOLN
400.0000 mg | Freq: Once | INTRAVENOUS | Status: AC
  Administered 2013-10-01: 400 mg via INTRAVENOUS
  Filled 2013-10-01: qty 200

## 2013-10-01 NOTE — ED Provider Notes (Signed)
CSN: [id number]     Arrival date & time 10/01/13  2000 History   First MD Initiated Contact with Patient 10/01/13 2139     Chief Complaint  Patient presents with  . Urinary Tract Infection     (Consider location/radiation/quality/duration/timing/severity/associated sxs/prior Treatment) Patient is a 68 y.o. female presenting with dysuria. The history is provided by the patient.  Dysuria Pain quality:  Burning Pain severity:  Moderate Onset quality:  Gradual Duration:  4 days Timing:  Intermittent Progression:  Worsening Chronicity:  New Recent urinary tract infections: no   Relieved by:  Nothing Worsened by:  Nothing tried Urinary symptoms: frequent urination and hesitancy   Associated symptoms: no abdominal pain, no nausea and no vomiting   Associated symptoms comment:  Back pain Risk factors: no hx of pyelonephritis and no urinary catheter     Past Medical History  Diagnosis Date  . Diabetes mellitus   . Hypertension   . Thyroid disease   . Hyperlipidemia    History reviewed. No pertinent past surgical history. No family history on file. History  Substance Use Topics  . Smoking status: Never Smoker   . Smokeless tobacco: Never Used  . Alcohol Use: No   OB History   Grav Para Term Preterm Abortions TAB SAB Ect Mult Living                 Review of Systems  Constitutional: Negative for activity change.       All ROS Neg except as noted in HPI  HENT: Negative for nosebleeds.   Eyes: Negative for photophobia and discharge.  Respiratory: Negative for cough, shortness of breath and wheezing.   Cardiovascular: Negative for chest pain and palpitations.  Gastrointestinal: Negative for nausea, vomiting, abdominal pain and blood in stool.  Genitourinary: Positive for dysuria and urgency. Negative for frequency and hematuria.  Musculoskeletal: Negative for arthralgias, back pain and neck pain.  Skin: Negative.   Neurological: Negative for dizziness, seizures and speech  difficulty.  Psychiatric/Behavioral: Negative for hallucinations and confusion.      Allergies  Penicillins  Home Medications   Prior to Admission medications   Medication Sig Start Date End Date Taking? Authorizing Provider  amLODipine (NORVASC) 10 MG tablet Take 10 mg by mouth every evening.    Yes Historical Provider, MD  aspirin EC 81 MG tablet Take 81 mg by mouth every evening.    Yes Historical Provider, MD  atorvastatin (LIPITOR) 20 MG tablet Take 20 mg by mouth every evening.    Yes Historical Provider, MD  cholecalciferol (VITAMIN D) 1000 UNITS tablet Take 1,000 Units by mouth every morning.   Yes Historical Provider, MD  fish oil-omega-3 fatty acids 1000 MG capsule Take 1 g by mouth every morning.   Yes Historical Provider, MD  insulin glargine (LANTUS) 100 UNIT/ML injection Inject 48 Units into the skin at bedtime. 07/24/11  Yes Modena Jansky, MD  levothyroxine (SYNTHROID, LEVOTHROID) 75 MCG tablet Take 75 mcg by mouth every morning.    Yes Historical Provider, MD  Liraglutide (VICTOZA) 18 MG/3ML SOPN Inject 1.2 mg into the skin every morning.   Yes Historical Provider, MD  metFORMIN (GLUCOPHAGE) 500 MG tablet Take 1 tablet (500 mg total) by mouth 2 (two) times daily with a meal. 07/24/11  Yes Modena Jansky, MD  olmesartan-hydrochlorothiazide (BENICAR HCT) 40-12.5 MG per tablet Take 1 tablet by mouth every morning.    Yes Historical Provider, MD   BP 131/60  Pulse 104  Temp(Src)  101.3 F (38.5 C) (Rectal)  Resp 20  Ht 5\' 4"  (1.626 m)  Wt 169 lb (76.658 kg)  BMI 28.99 kg/m2  SpO2 95% Physical Exam  Nursing note and vitals reviewed. Constitutional: She is oriented to person, place, and time. She appears well-developed and well-nourished.  Non-toxic appearance.  HENT:  Head: Normocephalic.  Right Ear: Tympanic membrane and external ear normal.  Left Ear: Tympanic membrane and external ear normal.  Eyes: EOM and lids are normal. Pupils are equal, round, and reactive  to light.  Neck: Normal range of motion. Neck supple. Carotid bruit is not present.  Cardiovascular: Normal rate, regular rhythm, normal heart sounds, intact distal pulses and normal pulses.   Pulmonary/Chest: Breath sounds normal. No respiratory distress.  Abdominal: Soft. Bowel sounds are normal. There is no tenderness. There is no guarding.    Musculoskeletal: Normal range of motion.  Lymphadenopathy:       Head (right side): No submandibular adenopathy present.       Head (left side): No submandibular adenopathy present.    She has no cervical adenopathy.  Neurological: She is alert and oriented to person, place, and time. She has normal strength. No cranial nerve deficit or sensory deficit.  Skin: Skin is warm and dry.  Psychiatric: She has a normal mood and affect. Her speech is normal.    ED Course  Procedures (including critical care time) Labs Review Labs Reviewed  URINALYSIS, ROUTINE W REFLEX MICROSCOPIC - Abnormal; Notable for the following:    Color, Urine ORANGE (*)    APPearance CLOUDY (*)    Glucose, UA 100 (*)    Hgb urine dipstick LARGE (*)    Bilirubin Urine SMALL (*)    Ketones, ur TRACE (*)    Protein, ur >300 (*)    Urobilinogen, UA 4.0 (*)    Nitrite POSITIVE (*)    Leukocytes, UA MODERATE (*)    All other components within normal limits  URINE MICROSCOPIC-ADD ON - Abnormal; Notable for the following:    Bacteria, UA MANY (*)    All other components within normal limits  CBC WITH DIFFERENTIAL - Abnormal; Notable for the following:    WBC 13.9 (*)    HCT 35.8 (*)    Neutrophils Relative % 80 (*)    Neutro Abs 11.0 (*)    All other components within normal limits  BASIC METABOLIC PANEL - Abnormal; Notable for the following:    Sodium 134 (*)    Chloride 93 (*)    Glucose, Bld 207 (*)    All other components within normal limits  CBG MONITORING, ED - Abnormal; Notable for the following:    Glucose-Capillary 180 (*)    All other components within  normal limits  URINE CULTURE  LACTIC ACID, PLASMA    Imaging Review No results found.   EKG Interpretation None      MDM UA is consistent with UTI. Vital signs stable. Culture sent to the lab. WBC elevated at 13.9. No vomiting, no high fever. There is some reported back pain. Plan: IV cipro given in the ED. Rx for cipro and norco given to the patient. Pt to return to the ED if any changes or problem.   Final diagnoses:  None    *I have reviewed nursing notes, vital signs, and all appropriate lab and imaging results for this patient.Lenox Ahr, PA-C 10/03/13 [phone]

## 2013-10-01 NOTE — ED Notes (Signed)
Patient was seen at [facility] and sent to ED for further evaluation of possible kidney infection.

## 2013-10-02 MED ORDER — CIPROFLOXACIN HCL 500 MG PO TABS: 500.0000 mg | ORAL_TABLET | Freq: Two times a day (BID) | ORAL | Status: DC

## 2013-10-02 MED ORDER — HYDROCODONE-ACETAMINOPHEN 5-325 MG PO TABS: 1.0000 | ORAL_TABLET | ORAL | Status: DC | PRN

## 2013-10-02 NOTE — Discharge Instructions (Signed)
Your test revealed a urinary tract infection. Please increase fluids. Please use Cipro 2 times daily with food until all taken. Please have your urine rechecked in 7-10 days. Please use Tylenol for mild pain, may use Norco for more severe pain. Norco may cause drowsiness, please use with caution. Please see your primary physician, or return to the emergency apartment if any fever that would not respond to Tylenol or Urinary Tract Infection Urinary tract infections (UTIs) can develop anywhere along your urinary tract. Your urinary tract is your body's drainage system for removing wastes and extra water. Your urinary tract includes two kidneys, two ureters, a bladder, and a urethra. Your kidneys are a pair of bean-shaped organs. Each kidney is about the size of your fist. They are located below your ribs, one on each side of your spine. CAUSES Infections are caused by microbes, which are microscopic organisms, including fungi, viruses, and bacteria. These organisms are so small that they can only be seen through a microscope. Bacteria are the microbes that most commonly cause UTIs. SYMPTOMS  Symptoms of UTIs may vary by age and gender of the patient and by the location of the infection. Symptoms in young women typically include a frequent and intense urge to urinate and a painful, burning feeling in the bladder or urethra during urination. Older women and men are more likely to be tired, shaky, and weak and have muscle aches and abdominal pain. A fever may mean the infection is in your kidneys. Other symptoms of a kidney infection include pain in your back or sides below the ribs, nausea, and vomiting. DIAGNOSIS To diagnose a UTI, your caregiver will ask you about your symptoms. Your caregiver also will ask to provide a urine sample. The urine sample will be tested for bacteria and white blood cells. White blood cells are made by your body to help fight infection. TREATMENT  Typically, UTIs can be treated  with medication. Because most UTIs are caused by a bacterial infection, they usually can be treated with the use of antibiotics. The choice of antibiotic and length of treatment depend on your symptoms and the type of bacteria causing your infection. HOME CARE INSTRUCTIONS  If you were prescribed antibiotics, take them exactly as your caregiver instructs you. Finish the medication even if you feel better after you have only taken some of the medication.  Drink enough water and fluids to keep your urine clear or pale yellow.  Avoid caffeine, tea, and carbonated beverages. They tend to irritate your bladder.  Empty your bladder often. Avoid holding urine for long periods of time.  Empty your bladder before and after sexual intercourse.  After a bowel movement, women should cleanse from front to back. Use each tissue only once. SEEK MEDICAL CARE IF:   You have back pain.  You develop a fever.  Your symptoms do not begin to resolve within 3 days. SEEK IMMEDIATE MEDICAL CARE IF:   You have severe back pain or lower abdominal pain.  You develop chills.  You have nausea or vomiting.  You have continued burning or discomfort with urination. MAKE SURE YOU:   Understand these instructions.  Will watch your condition.  Will get help right away if you are not doing well or get worse. Document Released: 03/07/2005 Document Revised: 11/27/2011 Document Reviewed: 07/06/2011 [facility] Patient Information 2014 [employer], [place].  ibuprofen, excessive nausea or vomiting, or deterioration in her general condition.

## 2013-10-02 NOTE — ED Notes (Signed)
Patient states she feels much better at this time.

## 2013-10-03 NOTE — ED Provider Notes (Signed)
Medical screening examination/treatment/procedure(s) were performed by non-physician practitioner and as supervising physician I was immediately available for consultation/collaboration.   EKG Interpretation None        Maudry Diego, MD 10/03/13 1538

## 2013-10-04 LAB — URINE CULTURE

## 2013-10-06 ENCOUNTER — Telehealth (HOSPITAL_BASED_OUTPATIENT_CLINIC_OR_DEPARTMENT_OTHER): Payer: Self-pay | Admitting: Emergency Medicine

## 2013-10-06 NOTE — Telephone Encounter (Signed)
Post ED Visit - Positive Culture Follow-up  Culture report reviewed by antimicrobial stewardship pharmacist: []  Wes Dulaney, Pharm.D., BCPS []  Heide Guile, Pharm.D., BCPS []  Alycia Rossetti, Pharm.D., BCPS []  [employer] [place], Pharm.D., BCPS, AAHIVP []  Legrand Como, Pharm.D., BCPS, AAHIVP [x]  Juliene Pina, Pharm.D.  Positive urine culture Treated with Cipro, organism sensitive to the same and no further patient follow-up is required at this time.  Myrna Blazer 10/06/2013, 12:39 PM

## 2014-05-20 ENCOUNTER — Encounter (HOSPITAL_COMMUNITY): Payer: Self-pay | Admitting: Cardiovascular Disease

## 2015-08-16 ENCOUNTER — Telehealth: Payer: Self-pay

## 2015-08-16 NOTE — Telephone Encounter (Signed)
Pt called to speak with DS. She had received a triage letter. Please call her at [phone] or [phone]

## 2015-08-18 ENCOUNTER — Other Ambulatory Visit: Payer: Self-pay

## 2015-08-18 DIAGNOSIS — Z1211 Encounter for screening for malignant neoplasm of colon: Secondary | ICD-10-CM

## 2015-08-18 NOTE — Telephone Encounter (Signed)
Pt has been triaged.

## 2015-08-22 NOTE — Telephone Encounter (Signed)
OK to schedule. Day of prep: 1/2 dose insulin, victoza, metformin

## 2015-08-22 NOTE — Telephone Encounter (Addendum)
Gastroenterology Pre-Procedure Review  Request Date: 09/15/2015 Requesting Physician: Dr. Cindie Laroche  PATIENT REVIEW QUESTIONS: The patient responded to the following health history questions as indicated:     This will be the patient's first colonoscopy  1. Diabetes Melitis: YES 2. Joint replacements in the past 12 months: no 3. Major health problems in the past 3 months: no 4. Has an artificial valve or MVP: no 5. Has a defibrillator: no 6. Has been advised in past to take antibiotics in advance of a procedure like teeth cleaning: no 7. Family history of colon cancer: no  8. Alcohol Use: no 9. History of sleep apnea: no     MEDICATIONS & ALLERGIES:    Patient reports the following regarding taking any blood thinners:   Plavix? no Aspirin? YES Coumadin? no  Patient confirms/reports the following medications:  Current Outpatient Prescriptions  Medication Sig Dispense Refill  . amLODipine (NORVASC) 10 MG tablet Take 10 mg by mouth every evening.     Marland Kitchen aspirin EC 81 MG tablet Take 81 mg by mouth every evening.     Marland Kitchen atorvastatin (LIPITOR) 20 MG tablet Take 20 mg by mouth every evening.     . cholecalciferol (VITAMIN D) 1000 UNITS tablet Take 1,000 Units by mouth every morning.    . fish oil-omega-3 fatty acids 1000 MG capsule Take 1 g by mouth every morning.    . Insulin Glargine (TOUJEO SOLOSTAR Tiro) Inject into the skin. 50 UNITS AT BEDTIME    . levothyroxine (SYNTHROID, LEVOTHROID) 75 MCG tablet Take 75 mcg by mouth every morning.     . Liraglutide (VICTOZA) 18 MG/3ML SOPN Inject 0.6 mg into the skin every morning.     . metFORMIN (GLUCOPHAGE) 500 MG tablet Take 1 tablet (500 mg total) by mouth 2 (two) times daily with a meal.    . olmesartan-hydrochlorothiazide (BENICAR HCT) 40-12.5 MG per tablet Take 1 tablet by mouth every morning.      No current facility-administered medications for this visit.    Patient confirms/reports the following allergies:  Allergies  Allergen  Reactions  . Penicillins Hives    No orders of the defined types were placed in this encounter.    AUTHORIZATION INFORMATION Primary Insurance:  ID #:  Group #:  Pre-Cert / Auth required:  Pre-Cert / Auth #:   Secondary Insurance:   ID #:  Group #:  Pre-Cert / Auth required Pre-Cert / Auth #:   SCHEDULE INFORMATION: Procedure has been scheduled as follows:  Date: 09/15/2015               Time:  9:45 AM Location: [facility]  This Gastroenterology Pre-Precedure Review Form is being routed to the following provider(s): R. Garfield Cornea, MD

## 2015-08-22 NOTE — Addendum Note (Signed)
Addended by: Mahala Menghini on: 08/22/2015 01:26 PM   Modules accepted: Orders, Medications

## 2015-08-23 MED ORDER — PEG 3350-KCL-NA BICARB-NACL 420 G PO SOLR: 4000.0000 mL | ORAL | Status: DC

## 2015-08-23 NOTE — Addendum Note (Signed)
Addended by: Everardo All on: 08/23/2015 01:18 PM   Modules accepted: Orders

## 2015-08-23 NOTE — Telephone Encounter (Signed)
Rx sent to the pharmacy and instructions mailed to pt.  

## 2015-09-12 ENCOUNTER — Telehealth: Payer: Self-pay

## 2015-09-12 NOTE — Telephone Encounter (Signed)
I called [employer] at [phone] and spoke to Robinson who said a PA is not required for the screening colonoscopy as out patient at [facility].

## 2015-09-15 ENCOUNTER — Encounter (HOSPITAL_COMMUNITY): Payer: Self-pay

## 2015-09-15 ENCOUNTER — Ambulatory Visit (HOSPITAL_COMMUNITY)
Admission: RE | Admit: 2015-09-15 | Discharge: 2015-09-15 | Disposition: A | Discharge status: Home / Self Care | Payer: Medicare Other | Source: Ambulatory Visit | Attending: Internal Medicine | Admitting: Internal Medicine

## 2015-09-15 ENCOUNTER — Encounter (HOSPITAL_COMMUNITY)
Admission: RE | Disposition: A | Discharge status: Home / Self Care | Payer: Self-pay | Source: Ambulatory Visit | Attending: Internal Medicine

## 2015-09-15 DIAGNOSIS — Z7982 Long term (current) use of aspirin: Secondary | ICD-10-CM | POA: Insufficient documentation

## 2015-09-15 DIAGNOSIS — I10 Essential (primary) hypertension: Secondary | ICD-10-CM | POA: Diagnosis not present

## 2015-09-15 DIAGNOSIS — Z1211 Encounter for screening for malignant neoplasm of colon: Secondary | ICD-10-CM | POA: Diagnosis not present

## 2015-09-15 DIAGNOSIS — D120 Benign neoplasm of cecum: Secondary | ICD-10-CM | POA: Diagnosis not present

## 2015-09-15 DIAGNOSIS — E785 Hyperlipidemia, unspecified: Secondary | ICD-10-CM | POA: Insufficient documentation

## 2015-09-15 DIAGNOSIS — D123 Benign neoplasm of transverse colon: Secondary | ICD-10-CM

## 2015-09-15 DIAGNOSIS — K5730 Diverticulosis of large intestine without perforation or abscess without bleeding: Secondary | ICD-10-CM

## 2015-09-15 DIAGNOSIS — E119 Type 2 diabetes mellitus without complications: Secondary | ICD-10-CM | POA: Insufficient documentation

## 2015-09-15 DIAGNOSIS — Z79899 Other long term (current) drug therapy: Secondary | ICD-10-CM | POA: Diagnosis not present

## 2015-09-15 DIAGNOSIS — Z794 Long term (current) use of insulin: Secondary | ICD-10-CM | POA: Insufficient documentation

## 2015-09-15 DIAGNOSIS — K635 Polyp of colon: Secondary | ICD-10-CM | POA: Diagnosis not present

## 2015-09-15 DIAGNOSIS — Z86010 Personal history of colonic polyps: Secondary | ICD-10-CM | POA: Insufficient documentation

## 2015-09-15 HISTORY — PX: COLONOSCOPY: SHX5424

## 2015-09-15 LAB — GLUCOSE, CAPILLARY: GLUCOSE-CAPILLARY: 189 mg/dL — AB (ref 65–99)

## 2015-09-15 SURGERY — COLONOSCOPY
Anesthesia: Moderate Sedation

## 2015-09-15 MED ORDER — ONDANSETRON HCL 4 MG/2ML IJ SOLN
INTRAMUSCULAR | Status: AC
  Filled 2015-09-15: qty 2

## 2015-09-15 MED ORDER — STERILE WATER FOR IRRIGATION IR SOLN
Status: DC | PRN
  Administered 2015-09-15: 11:00:00

## 2015-09-15 MED ORDER — ONDANSETRON HCL 4 MG/2ML IJ SOLN
INTRAMUSCULAR | Status: DC | PRN
  Administered 2015-09-15: 4 mg via INTRAVENOUS

## 2015-09-15 MED ORDER — SODIUM CHLORIDE 0.9 % IV SOLN
INTRAVENOUS | Status: DC | PRN
  Administered 2015-09-15: 5 mL

## 2015-09-15 MED ORDER — MEPERIDINE HCL 100 MG/ML IJ SOLN
INTRAMUSCULAR | Status: DC | PRN
  Administered 2015-09-15: 25 mg via INTRAVENOUS
  Administered 2015-09-15 (×2): 50 mg via INTRAVENOUS

## 2015-09-15 MED ORDER — SODIUM CHLORIDE 0.9 % IV SOLN
INTRAVENOUS | Status: DC
  Administered 2015-09-15: 10:00:00 via INTRAVENOUS

## 2015-09-15 MED ORDER — MEPERIDINE HCL 100 MG/ML IJ SOLN
INTRAMUSCULAR | Status: AC
  Filled 2015-09-15: qty 2

## 2015-09-15 MED ORDER — MIDAZOLAM HCL 5 MG/5ML IJ SOLN
INTRAMUSCULAR | Status: AC
  Filled 2015-09-15: qty 10

## 2015-09-15 MED ORDER — MIDAZOLAM HCL 5 MG/5ML IJ SOLN
INTRAMUSCULAR | Status: DC | PRN
  Administered 2015-09-15: 1 mg via INTRAVENOUS
  Administered 2015-09-15 (×2): 2 mg via INTRAVENOUS
  Administered 2015-09-15: 1 mg via INTRAVENOUS

## 2015-09-15 NOTE — Discharge Instructions (Signed)
Colon polyp and diverticulosis information provided  No MRI until clips gone  Further recommendations to follow pending review of pathology report Colonoscopy Discharge Instructions  Read the instructions outlined below and refer to this sheet in the next few weeks. These discharge instructions provide you with general information on caring for yourself after you leave the hospital. Your doctor may also give you specific instructions. While your treatment has been planned according to the most current medical practices available, unavoidable complications occasionally occur. If you have any problems or questions after discharge, call Dr. Gala Romney at [phone]. ACTIVITY  You may resume your regular activity, but move at a slower pace for the next 24 hours.   Take frequent rest periods for the next 24 hours.   Walking will help get rid of the air and reduce the bloated feeling in your belly (abdomen).   No driving for 24 hours (because of the medicine (anesthesia) used during the test).    Do not sign any important legal documents or operate any machinery for 24 hours (because of the anesthesia used during the test).  NUTRITION  Drink plenty of fluids.   You may resume your normal diet as instructed by your doctor.   Begin with a light meal and progress to your normal diet. Heavy or fried foods are harder to digest and may make you feel sick to your stomach (nauseated).   Avoid alcoholic beverages for 24 hours or as instructed.  MEDICATIONS  You may resume your normal medications unless your doctor tells you otherwise.  WHAT YOU CAN EXPECT TODAY  Some feelings of bloating in the abdomen.   Passage of more gas than usual.   Spotting of blood in your stool or on the toilet paper.  IF YOU HAD POLYPS REMOVED DURING THE COLONOSCOPY:  No aspirin products for 7 days or as instructed.   No alcohol for 7 days or as instructed.   Eat a soft diet for the next 24 hours.  FINDING OUT  THE RESULTS OF YOUR TEST Not all test results are available during your visit. If your test results are not back during the visit, make an appointment with your caregiver to find out the results. Do not assume everything is normal if you have not heard from your caregiver or the medical facility. It is important for you to follow up on all of your test results.  SEEK IMMEDIATE MEDICAL ATTENTION IF:  You have more than a spotting of blood in your stool.   Your belly is swollen (abdominal distention).   You are nauseated or vomiting.   You have a temperature over 101.   You have abdominal pain or discomfort that is severe or gets worse throughout the day.    Colon Polyps Polyps are lumps of extra tissue growing inside the body. Polyps can grow in the large intestine (colon). Most colon polyps are noncancerous (benign). However, some colon polyps can become cancerous over time. Polyps that are larger than a pea may be harmful. To be safe, caregivers remove and test all polyps. CAUSES  Polyps form when mutations in the genes cause your cells to grow and divide even though no more tissue is needed. RISK FACTORS There are a number of risk factors that can increase your chances of getting colon polyps. They include:  Being older than 50 years.  Family history of colon polyps or colon cancer.  Long-term colon diseases, such as colitis or Crohn disease.  Being overweight.  Smoking.  Being inactive.  Drinking too much alcohol. SYMPTOMS  Most small polyps do not cause symptoms. If symptoms are present, they may include:  Blood in the stool. The stool may look dark red or black.  Constipation or diarrhea that lasts longer than 1 week. DIAGNOSIS People often do not know they have polyps until their caregiver finds them during a regular checkup. Your caregiver can use 4 tests to check for polyps:  Digital rectal exam. The caregiver wears gloves and feels inside the rectum. This test  would find polyps only in the rectum.  Barium enema. The caregiver puts a liquid called barium into your rectum before taking X-rays of your colon. Barium makes your colon look white. Polyps are dark, so they are easy to see in the X-ray pictures.  Sigmoidoscopy. A thin, flexible tube (sigmoidoscope) is placed into your rectum. The sigmoidoscope has a light and tiny camera in it. The caregiver uses the sigmoidoscope to look at the last third of your colon.  Colonoscopy. This test is like sigmoidoscopy, but the caregiver looks at the entire colon. This is the most common method for finding and removing polyps. TREATMENT  Any polyps will be removed during a sigmoidoscopy or colonoscopy. The polyps are then tested for cancer. PREVENTION  To help lower your risk of getting more colon polyps:  Eat plenty of fruits and vegetables. Avoid eating fatty foods.  Do not smoke.  Avoid drinking alcohol.  Exercise every day.  Lose weight if recommended by your caregiver.  Eat plenty of calcium and folate. Foods that are rich in calcium include milk, cheese, and broccoli. Foods that are rich in folate include chickpeas, kidney beans, and spinach. HOME CARE INSTRUCTIONS Keep all follow-up appointments as directed by your caregiver. You may need periodic exams to check for polyps. SEEK MEDICAL CARE IF: You notice bleeding during a bowel movement.   This information is not intended to replace advice given to you by your health care provider. Make sure you discuss any questions you have with your health care provider.   Document Released: 02/22/2004 Document Revised: 06/18/2014 Document Reviewed: 08/07/2011 Elsevier Interactive Patient Education 2016 [employer].   Diverticulosis Diverticulosis is the condition that develops when small pouches (diverticula) form in the wall of your colon. Your colon, or large intestine, is where water is absorbed and stool is formed. The pouches form when the inside  layer of your colon pushes through weak spots in the outer layers of your colon. CAUSES  No one knows exactly what causes diverticulosis. RISK FACTORS  Being older than 51. Your risk for this condition increases with age. Diverticulosis is rare in people younger than 40 years. By age 53, almost everyone has it.  Eating a low-fiber diet.  Being frequently constipated.  Being overweight.  Not getting enough exercise.  Smoking.  Taking over-the-counter pain medicines, like aspirin and ibuprofen. SYMPTOMS  Most people with diverticulosis do not have symptoms. DIAGNOSIS  Because diverticulosis often has no symptoms, health care providers often discover the condition during an exam for other colon problems. In many cases, a health care provider will diagnose diverticulosis while using a flexible scope to examine the colon (colonoscopy). TREATMENT  If you have never developed an infection related to diverticulosis, you may not need treatment. If you have had an infection before, treatment may include:  Eating more fruits, vegetables, and grains.  Taking a fiber supplement.  Taking a live bacteria supplement (probiotic).  Taking medicine to relax your colon. HOME  CARE INSTRUCTIONS   Drink at least 6-8 glasses of water each day to prevent constipation.  Try not to strain when you have a bowel movement.  Keep all follow-up appointments. If you have had an infection before:  Increase the fiber in your diet as directed by your health care provider or dietitian.  Take a dietary fiber supplement if your health care provider approves.  Only take medicines as directed by your health care provider. SEEK MEDICAL CARE IF:   You have abdominal pain.  You have bloating.  You have cramps.  You have not gone to the bathroom in 3 days. SEEK IMMEDIATE MEDICAL CARE IF:   Your pain gets worse.  Yourbloating becomes very bad.  You have a fever or chills, and your symptoms suddenly  get worse.  You begin vomiting.  You have bowel movements that are bloody or black. MAKE SURE YOU:  Understand these instructions.  Will watch your condition.  Will get help right away if you are not doing well or get worse.   This information is not intended to replace advice given to you by your health care provider. Make sure you discuss any questions you have with your health care provider.   Document Released: 02/23/2004 Document Revised: 06/02/2013 Document Reviewed: 04/22/2013 Elsevier Interactive Patient Education [employer].

## 2015-09-15 NOTE — H&P (Signed)
@LOGO @   Primary Care Physician:  Maricela Curet, MD Primary Gastroenterologist:  Dr. Gala Romney  Pre-Procedure History & Physical: HPI:  Megan Schroeder is a 70 y.o. female is here for a screening colonoscopy. No bowel symptoms. No family history of colon cancer. No prior colonoscopy.  Past Medical History  Diagnosis Date  . Diabetes mellitus   . Hypertension   . Thyroid disease   . Hyperlipidemia     Past Surgical History  Procedure Laterality Date  . Left heart catheterization with coronary angiogram N/A 07/23/2011    Procedure: LEFT HEART CATHETERIZATION WITH CORONARY ANGIOGRAM;  Surgeon: [name], MD;  Location: [facility] CATH LAB;  Service: Cardiovascular;  Laterality: N/A;    Prior to Admission medications   Medication Sig Start Date End Date Taking? Authorizing Provider  amLODipine (NORVASC) 10 MG tablet Take 10 mg by mouth every evening.    Yes Historical Provider, MD  aspirin EC 81 MG tablet Take 81 mg by mouth every evening.    Yes Historical Provider, MD  atorvastatin (LIPITOR) 20 MG tablet Take 20 mg by mouth every evening.    Yes Historical Provider, MD  cholecalciferol (VITAMIN D) 1000 UNITS tablet Take 1,000 Units by mouth every morning.   Yes Historical Provider, MD  fish oil-omega-3 fatty acids 1000 MG capsule Take 1 g by mouth every morning.   Yes Historical Provider, MD  Insulin Glargine (TOUJEO SOLOSTAR [place]) Inject into the skin. 50 UNITS AT BEDTIME   Yes Historical Provider, MD  levothyroxine (SYNTHROID, LEVOTHROID) 75 MCG tablet Take 75 mcg by mouth every morning.    Yes Historical Provider, MD  Liraglutide (VICTOZA) 18 MG/3ML SOPN Inject 0.6 mg into the skin every morning.    Yes Historical Provider, MD  metFORMIN (GLUCOPHAGE) 500 MG tablet Take 1 tablet (500 mg total) by mouth 2 (two) times daily with a meal. 07/24/11  Yes Modena Jansky, MD  olmesartan-hydrochlorothiazide (BENICAR HCT) 40-12.5 MG per tablet Take 1 tablet by mouth every morning.    Yes  Historical Provider, MD  polyethylene glycol-electrolytes (TRILYTE) 420 g solution Take 4,000 mLs by mouth as directed. 08/23/15  Yes [name], MD    Allergies as of 08/18/2015 - Review Complete 08/16/2015  Allergen Reaction Noted  . Penicillins Hives 07/21/2011    History reviewed. No pertinent family history.  Social History   Social History  . Marital Status: Widowed    Spouse Name: N/A  . Number of Children: N/A  . Years of Education: N/A   Occupational History  . Not on file.   Social History Main Topics  . Smoking status: Never Smoker   . Smokeless tobacco: Never Used  . Alcohol Use: No  . Drug Use: No  . Sexual Activity: Yes    Birth Control/ Protection: Post-menopausal   Other Topics Concern  . Not on file   Social History Narrative    Review of Systems: See HPI, otherwise negative ROS  Physical Exam: BP 171/78 mmHg  Pulse 96  Temp(Src) 98.3 F (36.8 C) (Oral)  Resp 14  Ht 5\' 3"  (1.6 m)  Wt 160 lb (72.576 kg)  BMI 28.35 kg/m2  SpO2 99% General:   Alert,  Well-developed, well-nourished, pleasant and cooperative in NAD Head:  Normocephalic and atraumatic. Eyes:  Sclera clear, no icterus.   Conjunctiva pink. Ears:  Normal auditory acuity. Nose:  No deformity, discharge,  or lesions. Mouth:  No deformity or lesions, dentition normal. Neck:  Supple; no masses or  thyromegaly. Lungs:  Clear throughout to auscultation.   No wheezes, crackles, or rhonchi. No acute distress. Heart:  Regular rate and rhythm; no murmurs, clicks, rubs,  or gallops. Abdomen:  Soft, nontender and nondistended. No masses, hepatosplenomegaly or hernias noted. Normal bowel sounds, without guarding, and without rebound.   Msk:  Symmetrical without gross deformities. Normal posture. Pulses:  Normal pulses noted. Extremities:  Without clubbing or edema. Neurologic:  Alert and  oriented x4;  grossly normal neurologically. Skin:  Intact without significant lesions or  rashes. Cervical Nodes:  No significant cervical adenopathy. Psych:  Alert and cooperative. Normal mood and affect.  Impression/Plan: SHAELEY YAKLIN is now here to undergo a screening colonoscopy.  First ever average risk screening examination.  Risks, benefits, limitations, imponderables and alternatives regarding colonoscopy have been reviewed with the patient. Questions have been answered. All parties agreeable.     Notice:  This dictation was prepared with Dragon dictation along with smaller phrase technology. Any transcriptional errors that result from this process are unintentional and may not be corrected upon review.

## 2015-09-15 NOTE — Op Note (Signed)
[facility] Patient Name: Megan Schroeder Procedure Date: 09/15/2015 10:30 AM MRN: [id number] Date of Birth: 01-03-1946 Attending MD: Norvel Richards , MD CSN: [id number] Age: 70 Admit Type: Outpatient Procedure:                Colonoscopy Indications:              Screening for colorectal malignant neoplasm;                            first-ever average risk screening examination Providers:                Norvel Richards, MD, Gwenlyn Fudge, RN, Georgeann Oppenheim, Technician Referring MD:             Ralene Bathe. Dondiego, MD (Referring MD) Medicines:                Midazolam 6 mg IV, Meperidine 125 mg IV,                            Ondansetron 4 mg IV Complications:            No immediate complications. Estimated Blood Loss:     Estimated blood loss was minimal. Procedure:                Pre-Anesthesia Assessment:                           - Prior to the procedure, a History and Physical                            was performed, and patient medications and                            allergies were reviewed. The patient's tolerance of                            previous anesthesia was also reviewed. The risks                            and benefits of the procedure and the sedation                            options and risks were discussed with the patient.                            All questions were answered, and informed consent                            was obtained. Prior Anticoagulants: The patient has                            taken no previous anticoagulant or antiplatelet  agents. ASA Grade Assessment: II - A patient with                            mild systemic disease. After reviewing the risks                            and benefits, the patient was deemed in                            satisfactory condition to undergo the procedure.                           After obtaining informed consent, the colonoscope                             was passed under direct vision. Throughout the                            procedure, the patient's blood pressure, pulse, and                            oxygen saturations were monitored continuously. The                            EC-[id number]) scope was introduced through                            the anus and advanced to the the cecum, identified                            by appendiceal orifice and ileocecal valve. The                            ileocecal valve, appendiceal orifice, and rectum                            were photographed. The quality of the bowel                            preparation was adequate. Scope In: 10:54:24 AM Scope Out: 11:39:29 AM Scope Withdrawal Time: 0 hours 35 minutes 15 seconds  Total Procedure Duration: 0 hours 45 minutes 5 seconds  Findings:      The perianal and digital rectal examinations were normal.      A 9 mm polyp was found in the hepatic flexure. Patient had multiple 5-7       mm polyps in this segment as well.The polyp was semi-pedunculated.       Multiple polyps in the cecum. The largest polyp was 1.5 cm in       dimensions. It was flat adjacent to the appendiceal orifice. The patient       one 5 mm polyp in the rectum a 4 cm from the anal verge.. The patient       had pancolonic diverticulosis.The large polyp in the cecum was lifted  away from the colonic wall nicely with approximately 4 mL of normal       saline injected submucosally. It was removed cleanly with hot snare       cautery. Multiple hot and cold snare polypectomies were performed       removing the remaining polyps in the cecum, hepatic flexure and rectum.Marland Kitchen       Resection and retrieval were complete. Estimated blood loss was minimal.       I elected to the largest polypectomy site in the cecum with (3) 360       hemostasis clips. This was done without difficulty.      The colon (entire examined portion) appeared normal.       Scattered medium-mouthed diverticula were found in the entire colon.       There was no evidence of diverticular bleeding.      The exam was otherwise without abnormality on direct and retroflexion       views. Impression:               multiple rectal and colonic polyps removed as                            described above. Hemostasis clips placed.                            Pancolonic diverticulosis.-                           - Diverticulosis in the entire examined colon.                            There was no evidence of diverticular bleeding.                           - The examination was otherwise normal on direct                            and retroflexion views. Moderate Sedation:      Moderate (conscious) sedation was administered by the endoscopy nurse       and supervised by the endoscopist. The following parameters were       monitored: oxygen saturation, heart rate, blood pressure, respiratory       rate, EKG, adequacy of pulmonary ventilation, and response to care.       Total physician intraservice time was 50 minutes. Recommendation:           - Patient has a contact number available for                            emergencies. The signs and symptoms of potential                            delayed complications were discussed with the                            patient. Return to normal activities tomorrow.  Written discharge instructions were provided to the                            patient.                           - Advance diet as tolerated.                           - Continue present medications.                           - Await pathology results.                           - Repeat colonoscopy for surveillance based on                            pathology results. Procedure Code(s):        --- Professional ---                           [phone], Colonoscopy, flexible; with removal of                            tumor(s), polyp(s), or  other lesion(s) by snare                            technique                           99152, Moderate sedation services provided by the                            same physician or other qualified health care                            professional performing the diagnostic or                            therapeutic service that the sedation supports,                            requiring the presence of an independent trained                            observer to assist in the monitoring of the                            patient's level of consciousness and physiological                            status; initial 15 minutes of intraservice time,                            patient age 49 years or older  [id number], Moderate sedation services; each additional                            15 minutes intraservice time                           99153, Moderate sedation services; each additional                            15 minutes intraservice time Diagnosis Code(s):        --- Professional ---                           Z12.11, Encounter for screening for malignant                            neoplasm of colon                           D12.3, Benign neoplasm of transverse colon (hepatic                            flexure or splenic flexure)                           K57.30, Diverticulosis of large intestine without                            perforation or abscess without bleeding CPT copyright 2016 American Medical Association. All rights reserved. The codes documented in this report are preliminary and upon coder review may  be revised to meet current compliance requirements. Cristopher Estimable. Ayslin Kundert, MD Norvel Richards, MD 09/15/2015 11:55:10 AM This report has been signed electronically. Number of Addenda: 0

## 2015-09-19 ENCOUNTER — Encounter: Payer: Self-pay | Admitting: Internal Medicine

## 2015-09-20 ENCOUNTER — Encounter (HOSPITAL_COMMUNITY): Payer: Self-pay | Admitting: Internal Medicine

## 2015-12-24 ENCOUNTER — Emergency Department (HOSPITAL_COMMUNITY): Payer: Medicare Other

## 2015-12-24 ENCOUNTER — Inpatient Hospital Stay (HOSPITAL_COMMUNITY)
Admission: EM | Admit: 2015-12-24 | Discharge: 2015-12-30 | DRG: 603 | Disposition: A | Discharge status: Home / Self Care | Payer: Medicare Other | Attending: Family Medicine | Admitting: Family Medicine

## 2015-12-24 ENCOUNTER — Encounter (HOSPITAL_COMMUNITY): Payer: Self-pay | Admitting: Emergency Medicine

## 2015-12-24 DIAGNOSIS — I10 Essential (primary) hypertension: Secondary | ICD-10-CM | POA: Diagnosis not present

## 2015-12-24 DIAGNOSIS — Z794 Long term (current) use of insulin: Secondary | ICD-10-CM | POA: Diagnosis not present

## 2015-12-24 DIAGNOSIS — Z7982 Long term (current) use of aspirin: Secondary | ICD-10-CM

## 2015-12-24 DIAGNOSIS — Z880 Allergy status to penicillin: Secondary | ICD-10-CM | POA: Diagnosis not present

## 2015-12-24 DIAGNOSIS — E039 Hypothyroidism, unspecified: Secondary | ICD-10-CM | POA: Diagnosis present

## 2015-12-24 DIAGNOSIS — E785 Hyperlipidemia, unspecified: Secondary | ICD-10-CM | POA: Diagnosis present

## 2015-12-24 DIAGNOSIS — Z833 Family history of diabetes mellitus: Secondary | ICD-10-CM | POA: Diagnosis not present

## 2015-12-24 DIAGNOSIS — L03211 Cellulitis of face: Secondary | ICD-10-CM | POA: Diagnosis not present

## 2015-12-24 DIAGNOSIS — F419 Anxiety disorder, unspecified: Secondary | ICD-10-CM | POA: Diagnosis present

## 2015-12-24 DIAGNOSIS — E119 Type 2 diabetes mellitus without complications: Secondary | ICD-10-CM

## 2015-12-24 DIAGNOSIS — E118 Type 2 diabetes mellitus with unspecified complications: Secondary | ICD-10-CM

## 2015-12-24 DIAGNOSIS — R220 Localized swelling, mass and lump, head: Secondary | ICD-10-CM | POA: Diagnosis not present

## 2015-12-24 DIAGNOSIS — Z809 Family history of malignant neoplasm, unspecified: Secondary | ICD-10-CM | POA: Diagnosis not present

## 2015-12-24 LAB — GLUCOSE, CAPILLARY
GLUCOSE-CAPILLARY: 108 mg/dL — AB (ref 65–99)
Glucose-Capillary: 233 mg/dL — ABNORMAL HIGH (ref 65–99)
Glucose-Capillary: 411 mg/dL — ABNORMAL HIGH (ref 65–99)
Glucose-Capillary: 418 mg/dL — ABNORMAL HIGH (ref 65–99)
Glucose-Capillary: 353 mg/dL — ABNORMAL HIGH (ref 65–99)

## 2015-12-24 LAB — CBC WITH DIFFERENTIAL/PLATELET
Basophils Absolute: 0 10*3/uL (ref 0.0–0.1)
Basophils Relative: 0 %
EOS ABS: 0.1 10*3/uL (ref 0.0–0.7)
EOS PCT: 0 %
HCT: 37.9 % (ref 36.0–46.0)
Hemoglobin: 12.6 g/dL (ref 12.0–15.0)
LYMPHS ABS: 1.9 10*3/uL (ref 0.7–4.0)
Lymphocytes Relative: 17 %
MCH: 27.9 pg (ref 26.0–34.0)
MCHC: 33.2 g/dL (ref 30.0–36.0)
MCV: 84 fL (ref 78.0–100.0)
MONO ABS: 0.9 10*3/uL (ref 0.1–1.0)
Monocytes Relative: 8 %
Neutro Abs: 8.3 10*3/uL — ABNORMAL HIGH (ref 1.7–7.7)
Neutrophils Relative %: 75 %
PLATELETS: 181 10*3/uL (ref 150–400)
RBC: 4.51 MIL/uL (ref 3.87–5.11)
RDW: 13.8 % (ref 11.5–15.5)
WBC: 11.2 10*3/uL — AB (ref 4.0–10.5)

## 2015-12-24 LAB — URINALYSIS, ROUTINE W REFLEX MICROSCOPIC
Bilirubin Urine: NEGATIVE
Glucose, UA: >1000 mg/dL — AB
Ketones, ur: NEGATIVE mg/dL
Nitrite: NEGATIVE
PROTEIN: NEGATIVE mg/dL
Specific Gravity, Urine: <1.005 — ABNORMAL LOW (ref 1.005–1.030)
pH: 6 (ref 5.0–8.0)

## 2015-12-24 LAB — COMPREHENSIVE METABOLIC PANEL
ALT: 28 U/L (ref 14–54)
AST: 25 U/L (ref 15–41)
Albumin: 4.2 g/dL (ref 3.5–5.0)
Alkaline Phosphatase: 73 U/L (ref 38–126)
Anion gap: 9 (ref 5–15)
BUN: 10 mg/dL (ref 6–20)
CHLORIDE: 99 mmol/L — AB (ref 101–111)
CO2: 27 mmol/L (ref 22–32)
Calcium: 9.6 mg/dL (ref 8.9–10.3)
Creatinine, Ser: 0.55 mg/dL (ref 0.44–1.00)
Glucose, Bld: 323 mg/dL — ABNORMAL HIGH (ref 65–99)
POTASSIUM: 3.5 mmol/L (ref 3.5–5.1)
Sodium: 135 mmol/L (ref 135–145)
Total Bilirubin: 0.8 mg/dL (ref 0.3–1.2)
Total Protein: 8 g/dL (ref 6.5–8.1)

## 2015-12-24 LAB — URINE MICROSCOPIC-ADD ON

## 2015-12-24 MED ORDER — ENOXAPARIN SODIUM 40 MG/0.4ML ~~LOC~~ SOLN
40.0000 mg | SUBCUTANEOUS | Status: DC
  Administered 2015-12-24 – 2015-12-29 (×6): 40 mg via SUBCUTANEOUS
  Filled 2015-12-24 (×6): qty 0.4

## 2015-12-24 MED ORDER — INSULIN ASPART 100 UNIT/ML ~~LOC~~ SOLN
0.0000 [IU] | Freq: Every day | SUBCUTANEOUS | Status: DC
  Administered 2015-12-25 – 2015-12-26 (×2): 4 [IU] via SUBCUTANEOUS
  Administered 2015-12-27 – 2015-12-28 (×2): 3 [IU] via SUBCUTANEOUS
  Administered 2015-12-29: 4 [IU] via SUBCUTANEOUS

## 2015-12-24 MED ORDER — INSULIN ASPART 100 UNIT/ML ~~LOC~~ SOLN
0.0000 [IU] | Freq: Three times a day (TID) | SUBCUTANEOUS | Status: DC
  Administered 2015-12-24: 20 [IU] via SUBCUTANEOUS
  Administered 2015-12-25: 11 [IU] via SUBCUTANEOUS
  Administered 2015-12-25: 8 [IU] via SUBCUTANEOUS
  Administered 2015-12-25: 3 [IU] via SUBCUTANEOUS
  Administered 2015-12-26: 11 [IU] via SUBCUTANEOUS
  Administered 2015-12-26 (×2): 5 [IU] via SUBCUTANEOUS
  Administered 2015-12-27: 8 [IU] via SUBCUTANEOUS
  Administered 2015-12-27: 3 [IU] via SUBCUTANEOUS
  Administered 2015-12-27: 5 [IU] via SUBCUTANEOUS
  Administered 2015-12-28: 3 [IU] via SUBCUTANEOUS
  Administered 2015-12-28: 5 [IU] via SUBCUTANEOUS
  Administered 2015-12-28 – 2015-12-29 (×3): 8 [IU] via SUBCUTANEOUS
  Administered 2015-12-30: 2 [IU] via SUBCUTANEOUS

## 2015-12-24 MED ORDER — ONDANSETRON HCL 4 MG/2ML IJ SOLN: 4.0000 mg | Freq: Three times a day (TID) | INTRAMUSCULAR | Status: DC | PRN

## 2015-12-24 MED ORDER — CLINDAMYCIN PHOSPHATE 600 MG/50ML IV SOLN
INTRAVENOUS | Status: AC
  Filled 2015-12-24: qty 100

## 2015-12-24 MED ORDER — SODIUM CHLORIDE 0.9 % IV SOLN
INTRAVENOUS | Status: DC
  Administered 2015-12-24 – 2015-12-30 (×7): via INTRAVENOUS

## 2015-12-24 MED ORDER — KETOROLAC TROMETHAMINE 30 MG/ML IJ SOLN
30.0000 mg | Freq: Four times a day (QID) | INTRAMUSCULAR | Status: AC
  Administered 2015-12-24 – 2015-12-26 (×8): 30 mg via INTRAVENOUS
  Filled 2015-12-24 (×8): qty 1

## 2015-12-24 MED ORDER — IRBESARTAN 300 MG PO TABS
300.0000 mg | ORAL_TABLET | Freq: Every day | ORAL | Status: DC
  Administered 2015-12-25 – 2015-12-30 (×6): 300 mg via ORAL
  Filled 2015-12-24 (×6): qty 1

## 2015-12-24 MED ORDER — ATORVASTATIN CALCIUM 20 MG PO TABS
20.0000 mg | ORAL_TABLET | Freq: Every evening | ORAL | Status: DC
  Administered 2015-12-24 – 2015-12-29 (×6): 20 mg via ORAL
  Filled 2015-12-24 (×6): qty 1

## 2015-12-24 MED ORDER — INSULIN GLARGINE 100 UNIT/ML ~~LOC~~ SOLN
SUBCUTANEOUS | Status: AC
  Filled 2015-12-24: qty 10

## 2015-12-24 MED ORDER — LEVOTHYROXINE SODIUM 75 MCG PO TABS
75.0000 ug | ORAL_TABLET | Freq: Every day | ORAL | Status: DC
  Administered 2015-12-25 – 2015-12-30 (×6): 75 ug via ORAL
  Filled 2015-12-24 (×6): qty 1

## 2015-12-24 MED ORDER — ASPIRIN EC 81 MG PO TBEC
81.0000 mg | DELAYED_RELEASE_TABLET | Freq: Every evening | ORAL | Status: DC
  Administered 2015-12-24 – 2015-12-29 (×6): 81 mg via ORAL
  Filled 2015-12-24 (×6): qty 1

## 2015-12-24 MED ORDER — INSULIN GLARGINE 100 UNIT/ML ~~LOC~~ SOLN
50.0000 [IU] | Freq: Every day | SUBCUTANEOUS | Status: DC
  Administered 2015-12-24: 50 [IU] via SUBCUTANEOUS
  Filled 2015-12-24 (×2): qty 0.5

## 2015-12-24 MED ORDER — ACETAMINOPHEN 650 MG RE SUPP: 650.0000 mg | Freq: Four times a day (QID) | RECTAL | Status: DC | PRN

## 2015-12-24 MED ORDER — AMLODIPINE BESYLATE 5 MG PO TABS
10.0000 mg | ORAL_TABLET | Freq: Every evening | ORAL | Status: DC
  Administered 2015-12-24 – 2015-12-29 (×6): 10 mg via ORAL
  Filled 2015-12-24 (×6): qty 2

## 2015-12-24 MED ORDER — ONDANSETRON HCL 4 MG PO TABS: 4.0000 mg | ORAL_TABLET | Freq: Four times a day (QID) | ORAL | Status: DC | PRN

## 2015-12-24 MED ORDER — OLMESARTAN MEDOXOMIL-HCTZ 40-12.5 MG PO TABS: 1.0000 | ORAL_TABLET | Freq: Every morning | ORAL | Status: DC

## 2015-12-24 MED ORDER — CLINDAMYCIN PHOSPHATE 600 MG/50ML IV SOLN
600.0000 mg | Freq: Once | INTRAVENOUS | Status: AC
  Administered 2015-12-24: 600 mg via INTRAVENOUS
  Filled 2015-12-24: qty 50

## 2015-12-24 MED ORDER — ACETAMINOPHEN 325 MG PO TABS: 650.0000 mg | ORAL_TABLET | Freq: Four times a day (QID) | ORAL | Status: DC | PRN

## 2015-12-24 MED ORDER — INSULIN ASPART 100 UNIT/ML ~~LOC~~ SOLN
20.0000 [IU] | Freq: Once | SUBCUTANEOUS | Status: AC
  Administered 2015-12-24: 20 [IU] via SUBCUTANEOUS

## 2015-12-24 MED ORDER — ONDANSETRON HCL 4 MG/2ML IJ SOLN: 4.0000 mg | Freq: Four times a day (QID) | INTRAMUSCULAR | Status: DC | PRN

## 2015-12-24 MED ORDER — OXYCODONE HCL 5 MG PO TABS: 5.0000 mg | ORAL_TABLET | ORAL | Status: DC | PRN

## 2015-12-24 MED ORDER — IOPAMIDOL (ISOVUE-300) INJECTION 61%
75.0000 mL | Freq: Once | INTRAVENOUS | Status: AC | PRN
  Administered 2015-12-24: 75 mL via INTRAVENOUS

## 2015-12-24 MED ORDER — INSULIN GLARGINE 100 UNIT/ML ~~LOC~~ SOLN
50.0000 [IU] | Freq: Every day | SUBCUTANEOUS | Status: DC
  Administered 2015-12-25 – 2015-12-29 (×5): 50 [IU] via SUBCUTANEOUS
  Filled 2015-12-24 (×5): qty 0.5

## 2015-12-24 MED ORDER — CLINDAMYCIN PHOSPHATE 600 MG/50ML IV SOLN
600.0000 mg | Freq: Three times a day (TID) | INTRAVENOUS | Status: DC
  Administered 2015-12-24 – 2015-12-30 (×17): 600 mg via INTRAVENOUS
  Filled 2015-12-24 (×18): qty 50

## 2015-12-24 MED ORDER — FLUCONAZOLE 100 MG PO TABS
150.0000 mg | ORAL_TABLET | Freq: Once | ORAL | Status: AC
  Administered 2015-12-24: 150 mg via ORAL
  Filled 2015-12-24: qty 2

## 2015-12-24 MED ORDER — HYDROCHLOROTHIAZIDE 12.5 MG PO CAPS
12.5000 mg | ORAL_CAPSULE | Freq: Every day | ORAL | Status: DC
  Administered 2015-12-25 – 2015-12-30 (×6): 12.5 mg via ORAL
  Filled 2015-12-24 (×6): qty 1

## 2015-12-24 NOTE — ED Provider Notes (Signed)
CSN: [id number]     Arrival date & time 12/24/15  0720 History   First MD Initiated Contact with Patient 12/24/15 0801     Chief Complaint  Patient presents with  . Cellulitis     (Consider location/radiation/quality/duration/timing/severity/associated sxs/prior Treatment) The history is provided by the patient.   Megan Schroeder is a 70 y.o. female with past medical history most significant for diabetes presenting with a 2 day history of right facial and now.  Orbital swelling.  Her symptoms started as several small papules on her right cheek and has spread to now include swelling around her eye.  She reports pressure  Sensation of her cheek, mild tenderness and fevers to 100.5.  She denies vision changes and is able to freely move her eyes without pain.  She has had no nausea, emesis, myalgias.  Her CBGs typically run around 200 which was her last check yesterday.  She was seen by an urgent care center yesterday, placed on clindamycin and has had 2 doses of this medicine.  She has used warm compresses which relieves the discomfort but not the degree of swelling.     Past Medical History  Diagnosis Date  . Diabetes mellitus   . Hypertension   . Thyroid disease   . Hyperlipidemia    Past Surgical History  Procedure Laterality Date  . Left heart catheterization with coronary angiogram N/A 07/23/2011    Procedure: LEFT HEART CATHETERIZATION WITH CORONARY ANGIOGRAM;  Surgeon: [name], MD;  Location: [facility] CATH LAB;  Service: Cardiovascular;  Laterality: N/A;  . Colonoscopy N/A 09/15/2015    Procedure: COLONOSCOPY;  Surgeon: [name], MD;  Location[facility];  Service: Endoscopy;  Laterality: N/A;  9:45 AM - moved to 10:00 - office to notify   Family History  Problem Relation Age of Onset  . Cancer Father   . Diabetes Father    Social History  Substance Use Topics  . Smoking status: Never Smoker   . Smokeless tobacco: Never Used  . Alcohol Use: No   OB History     Gravida Para Term Preterm AB TAB SAB Ectopic Multiple Living   1 1 1       1      Review of Systems  Constitutional: Positive for fever.  HENT: Positive for facial swelling. Negative for congestion, dental problem, rhinorrhea and sore throat.   Eyes: Negative.   Respiratory: Negative for chest tightness and shortness of breath.   Cardiovascular: Negative for chest pain.  Gastrointestinal: Negative for nausea, vomiting and abdominal pain.  Genitourinary: Negative.   Musculoskeletal: Negative for joint swelling, arthralgias and neck pain.  Skin: Positive for color change.       Negative except as mentioned in HPI.    Neurological: Negative for dizziness, weakness, light-headedness, numbness and headaches.  Psychiatric/Behavioral: Negative.       Allergies  Penicillins  Home Medications   Prior to Admission medications   Medication Sig Start Date End Date Taking? Authorizing Provider  amLODipine (NORVASC) 10 MG tablet Take 10 mg by mouth every evening.    Yes Historical Provider, MD  aspirin EC 81 MG tablet Take 81 mg by mouth every evening.    Yes Historical Provider, MD  atorvastatin (LIPITOR) 20 MG tablet Take 20 mg by mouth every evening.    Yes Historical Provider, MD  cholecalciferol (VITAMIN D) 1000 UNITS tablet Take 1,000 Units by mouth every morning.   Yes Historical Provider, MD  clindamycin (CLEOCIN) 300  MG capsule Take 300 mg by mouth every 8 (eight) hours. For 10 days. 12/23/15  Yes Historical Provider, MD  fish oil-omega-3 fatty acids 1000 MG capsule Take 1 g by mouth every morning.   Yes Historical Provider, MD  Insulin Glargine (TOUJEO SOLOSTAR [place]) Inject into the skin. 50 UNITS AT BEDTIME   Yes Historical Provider, MD  levothyroxine (SYNTHROID, LEVOTHROID) 75 MCG tablet Take 75 mcg by mouth every morning.    Yes Historical Provider, MD  Liraglutide (VICTOZA) 18 MG/3ML SOPN Inject 0.6 mg into the skin every morning.    Yes Historical Provider, MD  metFORMIN  (GLUCOPHAGE) 500 MG tablet Take 1 tablet (500 mg total) by mouth 2 (two) times daily with a meal. 07/24/11  Yes Modena Jansky, MD  olmesartan-hydrochlorothiazide (BENICAR HCT) 40-12.5 MG per tablet Take 1 tablet by mouth every morning.    Yes Historical Provider, MD  polyethylene glycol-electrolytes (TRILYTE) 420 g solution Take 4,000 mLs by mouth as directed. Patient not taking: Reported on 12/24/2015 08/23/15   [name], MD   BP 147/80 mmHg  Pulse 95  Temp(Src) 98.5 F (36.9 C) (Oral)  Resp 18  Ht 5\' 5"  (1.651 m)  Wt 75.751 kg  BMI 27.79 kg/m2  SpO2 98% Physical Exam  Constitutional: She appears well-developed and well-nourished.  HENT:  Head: Atraumatic.  Mouth/Throat: Oropharynx is clear and moist.  Induration right maxilla with erythema and soft edema surrounding right orbit.  EOM's intact without painful ROM.  No drainage.  Several small papules right cheek, nondraining.  Eyes: Conjunctivae are normal.  Neck: Normal range of motion.  Cardiovascular: Normal rate, regular rhythm, normal heart sounds and intact distal pulses.   Pulmonary/Chest: Effort normal and breath sounds normal. She has no wheezes.  Abdominal: Soft. Bowel sounds are normal. There is no tenderness.  Musculoskeletal: Normal range of motion.  Lymphadenopathy:  No head or neck adenopathy   Neurological: She is alert.  Skin: Skin is warm and dry.  Psychiatric: She has a normal mood and affect.  Nursing note and vitals reviewed.   ED Course  Procedures (including critical care time) Labs Review Labs Reviewed  URINALYSIS, ROUTINE W REFLEX MICROSCOPIC (NOT AT [facility]) - Abnormal; Notable for the following:    Specific Gravity, Urine <1.005 (*)    Glucose, UA >1000 (*)    Hgb urine dipstick TRACE (*)    Leukocytes, UA SMALL (*)    All other components within normal limits  CBC WITH DIFFERENTIAL/PLATELET - Abnormal; Notable for the following:    WBC 11.2 (*)    Neutro Abs 8.3 (*)    All other  components within normal limits  COMPREHENSIVE METABOLIC PANEL - Abnormal; Notable for the following:    Chloride 99 (*)    Glucose, Bld 323 (*)    All other components within normal limits  URINE MICROSCOPIC-ADD ON - Abnormal; Notable for the following:    Squamous Epithelial / LPF 6-30 (*)    Bacteria, UA FEW (*)    All other components within normal limits  URINE CULTURE    Imaging Review Ct Maxillofacial W/cm  12/24/2015  CLINICAL DATA:  70 year old female with a history of right-sided facial swelling EXAM: CT MAXILLOFACIAL WITH CONTRAST TECHNIQUE: Multidetector CT imaging of the maxillofacial structures was performed with intravenous contrast. Multiplanar CT image reconstructions were also generated. A small metallic BB was placed on the right temple in order to reliably differentiate right from left. CONTRAST:  77mL ISOVUE-300 IOPAMIDOL (ISOVUE-300) INJECTION 61%  COMPARISON:  None. FINDINGS: Mandible: No acute fracture identified. Temporomandibular joints approximated. Patient is edentulous. Mid face: No acute bony abnormality identified. Paranasal sinuses: Patency of the frontal sinuses, ethmoid air cells, bilateral maxillary sinuses, sphenoid sinuses. Mastoid air cells are clear. No fluid within the middle ear on the left or the right. Soft tissue thickening in the superficial soft tissues of the right face, including the right temporal region, right supraorbital soft tissues, periorbital soft tissue, malar soft tissues. No focal fluid collection or evidence of abscess. The right-sided periorbital soft tissue infiltration does not extend into the orbit, with unremarkable intraconal and extraconal fat density. Multiple head and neck lymph nodes are present bilaterally, none of which are suspicious by CT size criteria or configuration. Orbits:  Unremarkable appearance the bilateral orbits. Unremarkable appearance the bilateral globes. Unremarkable appearance of the visualized intracranial  structures. Intracranial atherosclerosis. Degenerative changes of the cervical spine. No bony canal narrowing. IMPRESSION: Infiltration of the fat and soft tissues of the right face involving the temple region and periorbital soft tissues (including supraorbital and infraorbital soft tissues), compatible with cellulitis, with no evidence of abscess or focal fluid collection. Signed, Dulcy Fanny. Earleen Newport, DO [facility] Radiology Electronically Signed   By: Corrie Mckusick D.O.   On: 12/24/2015 11:08   I have personally reviewed and evaluated these images and lab results as part of my medical decision-making.   EKG Interpretation None      MDM   Final diagnoses:  Facial cellulitis    Patients labs reviewed.  Radiological studies were viewed, interpreted and considered during the medical decision making and disposition process. I agree with radiologists reading.  Results were also discussed with patient.   Pt with yeast in urine, given diflucan here.  Urine cx sent.  Facial cellulitis without abscess and without periorbital cellulitis, edema only.  Pt with DM, sx worsened despite starting clindamycin ytd.  Will plan admission for further IV abx.  Discussed with Dr. Thurnell Garbe who agrees with plan.  Call to hospitals for admission.     Evalee Jefferson, PA-C 12/24/15 [place], PA-C 12/24/15 [place], DO 12/25/15 [phone]

## 2015-12-24 NOTE — Progress Notes (Signed)
Patient's blood sugar 411, MD informed and orders given.

## 2015-12-24 NOTE — Progress Notes (Signed)
MD informed of patient's blood sugar of 418, additional orders given.

## 2015-12-24 NOTE — ED Notes (Signed)
Patient has significant swelling around right orbit with slight swelling noted around left orbit. Per patient woke yesterday with swelling. Patient does report fevers and itching. Patient was seen at [facility] yesterday and diagnosed with cellulitis of face, cutaneous abscess of face. Patient given clindamycin and told to come to ER for MRI if it got worse. Per patient progressively getting worse.

## 2015-12-24 NOTE — H&P (Signed)
History and Physical    Megan Schroeder [id number] DOB: Jan 10, 1946 DOA: 12/24/2015  Referring MD/NP/PA: Evalee Jefferson, PA-C PCP: Maricela Curet, MD  Outpatient Specialists:  Patient coming from: home  Chief Complaint: Cellulitis  HPI: Megan Schroeder is a 70 y.o. female with medical history significant of HTN, HLD, DM, hypothyroidism presented with complaints of right facial orbital swelling onset two days ago. She reports symptoms began as small pustule on her right cheek that she started piciking at on Thursday night. It subsequently worsened in appearance, became swollen, spread around her eye on Friday. She was seen at an urgent care center yesterday and was started on clindamycin. She took two doses of her antibiotic thus far. She has noted some slight improvement in appearance since yesterday. The area feels tight and does not hurt unless palpated. She reports using warm compresses which relieve the discomfort, but do not alleviate her swelling. She reports mild associated tenderness of area as well as fevers with T-max 100.5 yesterday. She denies any vision changes or pain, unless the area is palpated.   ED Course: While being evaluated in the ED, glucose was notably elevated at 323, CMP was otherwise unremarkable. Additionally, WBC was elevated mildly, but CBC was otherwise normal. UA showed elevated glucose. CT maxillofacial showed infiltration of the fat and soft tissues, compatible with cellulitis. Hospitalist was asked to refer for admission for treatment of cellulitis.   Review of Systems: As per HPI otherwise 10 point review of systems negative.    Past Medical History  Diagnosis Date  . Diabetes mellitus   . Hypertension   . Thyroid disease   . Hyperlipidemia     Past Surgical History  Procedure Laterality Date  . Left heart catheterization with coronary angiogram N/A 07/23/2011    Procedure: LEFT HEART CATHETERIZATION WITH CORONARY ANGIOGRAM;  Surgeon: [name], MD;  Location: [facility] CATH LAB;  Service: Cardiovascular;  Laterality: N/A;  . Colonoscopy N/A 09/15/2015    Procedure: COLONOSCOPY;  Surgeon: [name], MD;  Location: [facility];  Service: Endoscopy;  Laterality: N/A;  9:45 AM - moved to 10:00 - office to notify     reports that she has never smoked. She has never used smokeless tobacco. She reports that she does not drink alcohol or use illicit drugs.  Allergies  Allergen Reactions  . Penicillins Hives    Family History  Problem Relation Age of Onset  . Cancer Father   . Diabetes Father    Prior to Admission medications   Medication Sig Start Date End Date Taking? Authorizing Provider  amLODipine (NORVASC) 10 MG tablet Take 10 mg by mouth every evening.    Yes Historical Provider, MD  aspirin EC 81 MG tablet Take 81 mg by mouth every evening.    Yes Historical Provider, MD  atorvastatin (LIPITOR) 20 MG tablet Take 20 mg by mouth every evening.    Yes Historical Provider, MD  cholecalciferol (VITAMIN D) 1000 UNITS tablet Take 1,000 Units by mouth every morning.   Yes Historical Provider, MD  clindamycin (CLEOCIN) 300 MG capsule Take 300 mg by mouth every 8 (eight) hours. For 10 days. 12/23/15  Yes Historical Provider, MD  fish oil-omega-3 fatty acids 1000 MG capsule Take 1 g by mouth every morning.   Yes Historical Provider, MD  Insulin Glargine (TOUJEO SOLOSTAR [place]) Inject into the skin. 50 UNITS AT BEDTIME   Yes Historical Provider, MD  levothyroxine (SYNTHROID, LEVOTHROID) 75 MCG tablet Take 75 mcg  by mouth every morning.    Yes Historical Provider, MD  Liraglutide (VICTOZA) 18 MG/3ML SOPN Inject 0.6 mg into the skin every morning.    Yes Historical Provider, MD  metFORMIN (GLUCOPHAGE) 500 MG tablet Take 1 tablet (500 mg total) by mouth 2 (two) times daily with a meal. 07/24/11  Yes Modena Jansky, MD  olmesartan-hydrochlorothiazide (BENICAR HCT) 40-12.5 MG per tablet Take 1 tablet by mouth every morning.    Yes Historical  Provider, MD  polyethylene glycol-electrolytes (TRILYTE) 420 g solution Take 4,000 mLs by mouth as directed. Patient not taking: Reported on 12/24/2015 08/23/15   [name], MD    Physical Exam: Filed Vitals:   12/24/15 0730 12/24/15 0731 12/24/15 0934 12/24/15 1149  BP: 145/96 145/96 147/76 147/80  Pulse: 116 113 92 95  Temp:  98.6 F (37 C) 98.6 F (37 C) 98.5 F (36.9 C)  TempSrc:  Oral Oral Oral  Resp:  18 17 18   Height:  5\' 5"  (1.651 m)    Weight:  75.751 kg (167 lb)    SpO2: 98% 100% 98% 98%    Constitutional: NAD, calm, comfortable Filed Vitals:   12/24/15 0730 12/24/15 0731 12/24/15 0934 12/24/15 1149  BP: 145/96 145/96 147/76 147/80  Pulse: 116 113 92 95  Temp:  98.6 F (37 C) 98.6 F (37 C) 98.5 F (36.9 C)  TempSrc:  Oral Oral Oral  Resp:  18 17 18   Height:  5\' 5"  (1.651 m)    Weight:  75.751 kg (167 lb)    SpO2: 98% 100% 98% 98%   Eyes: PERRL, lids and conjunctivae normal ENMT: Mucous membranes are moist. Posterior pharynx clear of any exudate or lesions.Normal dentition. Significant swelling, erythema, and tenderness noted around right eye, and over right zygomatic arch.  Neck: normal, supple, no masses, no thyromegaly Respiratory: clear to auscultation bilaterally, no wheezing, no crackles. Normal respiratory effort. No accessory muscle use.  Cardiovascular: Regular rate and rhythm, no murmurs / rubs / gallops. No extremity edema. 2+ pedal pulses. No carotid bruits.  Abdomen: no tenderness, no masses palpated. No hepatosplenomegaly. Bowel sounds positive.  Musculoskeletal: no clubbing / cyanosis. No joint deformity upper and lower extremities. Good ROM, no contractures. Normal muscle tone.  Skin: no rashes, lesions, ulcers. No induration Neurologic: CN 2-12 grossly intact. Sensation intact, DTR normal. Strength 5/5 in all 4.  Psychiatric: Normal judgment and insight. Alert and oriented x 3. Normal mood.   Labs on Admission: I have personally reviewed  following labs and imaging studies  CBC:  Recent Labs Lab 12/24/15 0825  WBC 11.2*  NEUTROABS 8.3*  HGB 12.6  HCT 37.9  MCV 84.0  PLT 0000000   Basic Metabolic Panel:  Recent Labs Lab 12/24/15 0825  NA 135  K 3.5  CL 99*  CO2 27  GLUCOSE 323*  BUN 10  CREATININE 0.55  CALCIUM 9.6   GFR: Estimated Creatinine Clearance: 69.5 mL/min (by C-G formula based on Cr of 0.55). Liver Function Tests:  Recent Labs Lab 12/24/15 0825  AST 25  ALT 28  ALKPHOS 73  BILITOT 0.8  PROT 8.0  ALBUMIN 4.2   Urine analysis:    Component Value Date/Time   COLORURINE YELLOW 12/24/2015 0822   APPEARANCEUR CLEAR 12/24/2015 0822   LABSPEC <1.005* 12/24/2015 0822   PHURINE 6.0 12/24/2015 0822   GLUCOSEU >1000* 12/24/2015 0822   HGBUR TRACE* 12/24/2015 0822   BILIRUBINUR NEGATIVE 12/24/2015 0822   KETONESUR NEGATIVE 12/24/2015 0822   PROTEINUR  NEGATIVE 12/24/2015 0822   UROBILINOGEN 4.0* 10/01/2013 2025   NITRITE NEGATIVE 12/24/2015 0822   LEUKOCYTESUR SMALL* 12/24/2015 0822   Sepsis Labs: @LABRCNTIP (procalcitonin:4,lacticidven:4) )No results found for this or any previous visit (from the past 240 hour(s)).   Radiological Exams on Admission: Ct Maxillofacial W/cm  12/24/2015  CLINICAL DATA:  70 year old female with a history of right-sided facial swelling EXAM: CT MAXILLOFACIAL WITH CONTRAST TECHNIQUE: Multidetector CT imaging of the maxillofacial structures was performed with intravenous contrast. Multiplanar CT image reconstructions were also generated. A small metallic BB was placed on the right temple in order to reliably differentiate right from left. CONTRAST:  71mL ISOVUE-300 IOPAMIDOL (ISOVUE-300) INJECTION 61% COMPARISON:  None. FINDINGS: Mandible: No acute fracture identified. Temporomandibular joints approximated. Patient is edentulous. Mid face: No acute bony abnormality identified. Paranasal sinuses: Patency of the frontal sinuses, ethmoid air cells, bilateral maxillary  sinuses, sphenoid sinuses. Mastoid air cells are clear. No fluid within the middle ear on the left or the right. Soft tissue thickening in the superficial soft tissues of the right face, including the right temporal region, right supraorbital soft tissues, periorbital soft tissue, malar soft tissues. No focal fluid collection or evidence of abscess. The right-sided periorbital soft tissue infiltration does not extend into the orbit, with unremarkable intraconal and extraconal fat density. Multiple head and neck lymph nodes are present bilaterally, none of which are suspicious by CT size criteria or configuration. Orbits:  Unremarkable appearance the bilateral orbits. Unremarkable appearance the bilateral globes. Unremarkable appearance of the visualized intracranial structures. Intracranial atherosclerosis. Degenerative changes of the cervical spine. No bony canal narrowing. IMPRESSION: Infiltration of the fat and soft tissues of the right face involving the temple region and periorbital soft tissues (including supraorbital and infraorbital soft tissues), compatible with cellulitis, with no evidence of abscess or focal fluid collection. Signed, Dulcy Fanny. Earleen Newport, DO [facility] Radiology Electronically Signed   By: Corrie Mckusick D.O.   On: 12/24/2015 11:08    Assessment/Plan Active Problems:   Facial cellulitis   1. Facial cellulitis. CT maxillofacial indicative of cellulitis of the temporal and periorbital soft tissues. No evidence of abscess. WBC minimally elevated. Patient is afebrile. Since patient is penicillin allergic, she has been started on IV Clindamycin in the ED. Will continue the same with plans to transition to PO clindamycin once she is clinically better.   2. DM. Serum glucose elevated at 323. Continue on basal insulin and supplement with SSI. Hold metformin and Victoza. 3. HTN. Continue outpatient regimen. 4. HLD. Continue statins.   5. Hypothyroidism. Continue synthroid.  DVT prophylaxis: lovenox Code Status: full Family Communication: Discussed with patient and family present Disposition Plan: admit to medical. Discharge home once improved, possibly tomorrow. Consults called: none Admission status: admit as obs and medical bed  Kathie Dike, MD [facility] Pager [phone]  If 7PM-7AM, please contact night-coverage [email or web address] Password [email or web address]  12/24/2015, 11:55 AM   By signing my name below, I, Delene Ruffini, attest that this documentation has been prepared under the direction and in the presence of Kathie Dike, MD. Electronically Signed: Delene Ruffini 12/24/2015 2:30pm  I, Dr. Kathie Dike, personally performed the services described in this documentaiton. All medical record entries made by the scribe were at my direction and in my presence. I have reviewed the chart and agree that the record reflects my personal performance and is accurate and complete  Kathie Dike, MD, 12/24/2015 3:47 PM

## 2015-12-25 LAB — GLUCOSE, CAPILLARY
GLUCOSE-CAPILLARY: 318 mg/dL — AB (ref 65–99)
GLUCOSE-CAPILLARY: 305 mg/dL — AB (ref 65–99)
Glucose-Capillary: 200 mg/dL — ABNORMAL HIGH (ref 65–99)
Glucose-Capillary: 361 mg/dL — ABNORMAL HIGH (ref 65–99)
Glucose-Capillary: 271 mg/dL — ABNORMAL HIGH (ref 65–99)

## 2015-12-25 LAB — BASIC METABOLIC PANEL
ANION GAP: 10 (ref 5–15)
BUN: 14 mg/dL (ref 6–20)
CALCIUM: 8.5 mg/dL — AB (ref 8.9–10.3)
CHLORIDE: 101 mmol/L (ref 101–111)
CO2: 25 mmol/L (ref 22–32)
Creatinine, Ser: 0.55 mg/dL (ref 0.44–1.00)
GFR calc non Af Amer: >60 mL/min (ref >60)
Glucose, Bld: 186 mg/dL — ABNORMAL HIGH (ref 65–99)
POTASSIUM: 3.4 mmol/L — AB (ref 3.5–5.1)
Sodium: 136 mmol/L (ref 135–145)

## 2015-12-25 LAB — CBC
HEMATOCRIT: 33.7 % — AB (ref 36.0–46.0)
HEMOGLOBIN: 11.1 g/dL — AB (ref 12.0–15.0)
MCH: 28 pg (ref 26.0–34.0)
MCHC: 32.9 g/dL (ref 30.0–36.0)
MCV: 84.9 fL (ref 78.0–100.0)
Platelets: 172 10*3/uL (ref 150–400)
RBC: 3.97 MIL/uL (ref 3.87–5.11)
RDW: 13.8 % (ref 11.5–15.5)
WBC: 9.6 10*3/uL (ref 4.0–10.5)

## 2015-12-25 NOTE — Progress Notes (Signed)
HS CBG 108, no ss insulin required. Bedtime snack given.

## 2015-12-25 NOTE — Progress Notes (Signed)
Patient has less orbital periorbital swelling then reported yesterday currently on IV clindamycin doing well defervescing adequate glycemic control which is usually not very good and she reassured due to high anxiety levels hypertension suboptimally controlled glucoses over 24 hours Megan Schroeder [id number] DOB: 06-Mar-1946 DOA: 12/24/2015 PCP: Megan Curet, MD   Physical Exam: Blood pressure 162/79, pulse 87, temperature 98 F (36.7 C), temperature source Oral, resp. rate 20, height 5\' 5"  (1.651 m), weight 75.297 kg (166 lb), SpO2 100 %. Lungs clear to A&P neurosurgery heart regular rhythm no murmursor rubs diffuse erythema and swelling of the right maxillary area periorbital region right temporal region   Investigations:  No results found for this or any previous visit (from the past 240 hour(s)).   Basic Metabolic Panel:  Recent Labs  12/24/15 0825 12/25/15 0605  NA 135 136  K 3.5 3.4*  CL 99* 101  CO2 27 25  GLUCOSE 323* 186*  BUN 10 14  CREATININE 0.55 0.55  CALCIUM 9.6 8.5*   Liver Function Tests:  Recent Labs  12/24/15 0825  AST 25  ALT 28  ALKPHOS 73  BILITOT 0.8  PROT 8.0  ALBUMIN 4.2     CBC:  Recent Labs  12/24/15 0825 12/25/15 0605  WBC 11.2* 9.6  NEUTROABS 8.3*  --   HGB 12.6 11.1*  HCT 37.9 33.7*  MCV 84.0 84.9  PLT 181 172    Ct Maxillofacial W/cm  12/24/2015  CLINICAL DATA:  70 year old female with a history of right-sided facial swelling EXAM: CT MAXILLOFACIAL WITH CONTRAST TECHNIQUE: Multidetector CT imaging of the maxillofacial structures was performed with intravenous contrast. Multiplanar CT image reconstructions were also generated. A small metallic BB was placed on the right temple in order to reliably differentiate right from left. CONTRAST:  76mL ISOVUE-300 IOPAMIDOL (ISOVUE-300) INJECTION 61% COMPARISON:  None. FINDINGS: Mandible: No acute fracture identified. Temporomandibular joints approximated. Patient is edentulous. Mid  face: No acute bony abnormality identified. Paranasal sinuses: Patency of the frontal sinuses, ethmoid air cells, bilateral maxillary sinuses, sphenoid sinuses. Mastoid air cells are clear. No fluid within the middle ear on the left or the right. Soft tissue thickening in the superficial soft tissues of the right face, including the right temporal region, right supraorbital soft tissues, periorbital soft tissue, malar soft tissues. No focal fluid collection or evidence of abscess. The right-sided periorbital soft tissue infiltration does not extend into the orbit, with unremarkable intraconal and extraconal fat density. Multiple head and neck lymph nodes are present bilaterally, none of which are suspicious by CT size criteria or configuration. Orbits:  Unremarkable appearance the bilateral orbits. Unremarkable appearance the bilateral globes. Unremarkable appearance of the visualized intracranial structures. Intracranial atherosclerosis. Degenerative changes of the cervical spine. No bony canal narrowing. IMPRESSION: Infiltration of the fat and soft tissues of the right face involving the temple region and periorbital soft tissues (including supraorbital and infraorbital soft tissues), compatible with cellulitis, with no evidence of abscess or focal fluid collection. Signed, Dulcy Fanny. Earleen Newport, DO [facility] Radiology Electronically Signed   By: Corrie Mckusick D.O.   On: 12/24/2015 11:08      Medications:   Impression:  Active Problems:   Hypertension   Diabetes mellitus ([place])   Hyperlipidemia   Facial cellulitis   Hypothyroidism     Plan: Continue IV clindamycin and regression of cellulitis  Consultants:    Procedures   Antibiotics:         Time spent: 30 minutes  LOS: 1 day   [name]   12/25/2015, 2:09 PM

## 2015-12-26 LAB — GLUCOSE, CAPILLARY
GLUCOSE-CAPILLARY: 246 mg/dL — AB (ref 65–99)
GLUCOSE-CAPILLARY: 306 mg/dL — AB (ref 65–99)
GLUCOSE-CAPILLARY: 326 mg/dL — AB (ref 65–99)
Glucose-Capillary: 239 mg/dL — ABNORMAL HIGH (ref 65–99)

## 2015-12-26 NOTE — Progress Notes (Signed)
Inpatient Diabetes Program Recommendations  AACE/ADA: New Consensus Statement on Inpatient Glycemic Control (2015)  Target Ranges:  Prepandial:   less than 140 mg/dL      Peak postprandial:   less than 180 mg/dL (1-2 hours)      Critically ill patients:  140 - 180 mg/dL   Results for Megan Schroeder, KOLESZAR (MRN [id number]) as of 12/26/2015 08:20  Ref. Range 12/25/2015 07:48 12/25/2015 11:26 12/25/2015 13:39 12/25/2015 16:45 12/25/2015 21:19 12/26/2015 08:01  Glucose-Capillary Latest Ref Range: 65-99 mg/dL 200 (H) 318 (H) 361 (H) 271 (H) 305 (H) 246 (H)   Review of Glycemic Control  Diabetes history: DM2 Outpatient Diabetes medications: Toujeo 50 units QHS, Victoza 0.6 mg QAM, Metformin 500 mg BID Current orders for Inpatient glycemic control: Lantus 50 units QHS, Novolog 0-15 units TID with meals, Novolog 0-5 units QHS  Inpatient Diabetes Program Recommendations: Insulin - Basal: Please consider increasing Lantus to 53 units QHS. Insulin - Meal Coverage: Please consider ordering Novolog 5 units TID with meals for meal coverage.  Thanks, Barnie Alderman, RN, MSN, CDE Diabetes Coordinator Inpatient Diabetes Program [phone] (Team Pager from [place] to [place]) [phone] ([place] office) [phone] [facility] office) [phone] [facility] office)

## 2015-12-26 NOTE — Care Management Important Message (Signed)
Important Message  Patient Details  Name: ALLEE BRUSCA MRN: [id number] Date of Birth: [birth year]   Medicare Important Message Given:  Yes    Briant Sites, RN 12/26/2015, 12:21 PM

## 2015-12-26 NOTE — Progress Notes (Signed)
Patient alert and oriented afebrile maxillary area appears erythematous tense and swollen on clindamycin for 48 hours Megan Schroeder [id number] DOB: 04/06/1946 DOA: 12/24/2015 PCP: Maricela Curet, MD   Physical Exam: Blood pressure 182/78, pulse 93, temperature 98.8 F (37.1 C), temperature source Oral, resp. rate 20, height 5\' 5"  (1.651 m), weight 75.297 kg (166 lb), SpO2 100 %. Lungs clear to A&P no rales wheeze rhonchi heart regular rhythm no S3 S1 heaves feels rubs abdomen soft nontender bowel sounds normoactive   Investigations:  No results found for this or any previous visit (from the past 240 hour(s)).   Basic Metabolic Panel:  Recent Labs  12/24/15 0825 12/25/15 0605  NA 135 136  K 3.5 3.4*  CL 99* 101  CO2 27 25  GLUCOSE 323* 186*  BUN 10 14  CREATININE 0.55 0.55  CALCIUM 9.6 8.5*   Liver Function Tests:  Recent Labs  12/24/15 0825  AST 25  ALT 28  ALKPHOS 73  BILITOT 0.8  PROT 8.0  ALBUMIN 4.2     CBC:  Recent Labs  12/24/15 0825 12/25/15 0605  WBC 11.2* 9.6  NEUTROABS 8.3*  --   HGB 12.6 11.1*  HCT 37.9 33.7*  MCV 84.0 84.9  PLT 181 172    No results found.    Medications: mycin  Impression:  Active Problems:   Hypertension   Diabetes mellitus ([place])   Hyperlipidemia   Facial cellulitis   Hypothyroidism     Plan: Continue clindamycin will reevaluate antibiotic within 24 hours if no less swelling and   Consultants:     Procedures   Antibiotics: Clindamycin         Time spent: 30 minutes    LOS: 2 days   Nathanel Tallman M   12/26/2015, 11:34 AM

## 2015-12-27 LAB — CBC WITH DIFFERENTIAL/PLATELET
Basophils Absolute: 0 10*3/uL (ref 0.0–0.1)
Basophils Relative: 0 %
EOS ABS: 0.1 10*3/uL (ref 0.0–0.7)
EOS PCT: 2 %
HCT: 33.3 % — ABNORMAL LOW (ref 36.0–46.0)
Hemoglobin: 11.1 g/dL — ABNORMAL LOW (ref 12.0–15.0)
LYMPHS ABS: 1.9 10*3/uL (ref 0.7–4.0)
LYMPHS PCT: 33 %
MCH: 28.3 pg (ref 26.0–34.0)
MCHC: 33.3 g/dL (ref 30.0–36.0)
MCV: 84.9 fL (ref 78.0–100.0)
MONO ABS: 0.4 10*3/uL (ref 0.1–1.0)
MONOS PCT: 7 %
Neutro Abs: 3.3 10*3/uL (ref 1.7–7.7)
Neutrophils Relative %: 58 %
PLATELETS: 173 10*3/uL (ref 150–400)
RBC: 3.92 MIL/uL (ref 3.87–5.11)
RDW: 13.8 % (ref 11.5–15.5)
WBC: 5.8 10*3/uL (ref 4.0–10.5)

## 2015-12-27 LAB — GLUCOSE, CAPILLARY
GLUCOSE-CAPILLARY: 295 mg/dL — AB (ref 65–99)
GLUCOSE-CAPILLARY: 260 mg/dL — AB (ref 65–99)
Glucose-Capillary: 207 mg/dL — ABNORMAL HIGH (ref 65–99)
Glucose-Capillary: 167 mg/dL — ABNORMAL HIGH (ref 65–99)
Glucose-Capillary: 272 mg/dL — ABNORMAL HIGH (ref 65–99)

## 2015-12-27 LAB — URINE CULTURE: Special Requests: NORMAL

## 2015-12-27 NOTE — Progress Notes (Signed)
Right sided facial erythema much less tense and erythematous today compared to yesterday significant leukocytosis white count of 5.9 no shift Megan Schroeder [id number] DOB: Apr 26, 1946 DOA: 12/24/2015 PCP: Maricela Curet, MD   Physical Exam: Blood pressure 118/68, pulse 88, temperature 98.2 F (36.8 C), temperature source Oral, resp. rate 20, height 5\' 5"  (1.651 m), weight 75.297 kg (166 lb), SpO2 100 %. Lungs clear to A&P no rales wheeze rhonchi heart regular rhythm no murmur because heaves thrills rubs right facial area has erythema less tension then yesterday no significant tenderness   Investigations:  Recent Results (from the past 240 hour(s))  Urine culture     Status: Abnormal   Collection Time: 12/24/15  8:22 AM  Result Value Ref Range Status   Specimen Description URINE, CLEAN CATCH  Final   Special Requests Normal  Final   Culture MULTIPLE SPECIES PRESENT, SUGGEST RECOLLECTION (A)  Final   Report Status 12/27/2015 FINAL  Final     Basic Metabolic Panel:  Recent Labs  12/25/15 0605  NA 136  K 3.4*  CL 101  CO2 25  GLUCOSE 186*  BUN 14  CREATININE 0.55  CALCIUM 8.5*   Liver Function Tests: No results for input(s): AST, ALT, ALKPHOS, BILITOT, PROT, ALBUMIN in the last 72 hours.   CBC:  Recent Labs  12/25/15 0605 12/27/15 0719  WBC 9.6 5.8  NEUTROABS  --  3.3  HGB 11.1* 11.1*  HCT 33.7* 33.3*  MCV 84.9 84.9  PLT 172 173    No results found.    Medications:   Impression: Active Problems:   Hypertension   Diabetes mellitus ([place])   Hyperlipidemia   Facial cellulitis   Hypothyroidism     Plan:Continue IV clindamycin continue sliding scale insulin coverage   Consultants:    Procedures   Antibiotics: IV clindamycin       Time spent: 30 minutes   LOS: 3 days   Icy Fuhrmann M   12/27/2015, 12:11 PM

## 2015-12-27 NOTE — Progress Notes (Signed)
Inpatient Diabetes Program Recommendations  AACE/ADA: New Consensus Statement on Inpatient Glycemic Control (2015)  Target Ranges:  Prepandial:   less than 140 mg/dL      Peak postprandial:   less than 180 mg/dL (1-2 hours)      Critically ill patients:  140 - 180 mg/dL  Results for CARNESHA, KROSS (MRN [id number]) as of 12/27/2015 08:28  Ref. Range 12/26/2015 08:01 12/26/2015 11:41 12/26/2015 17:24 12/26/2015 19:39 12/27/2015 07:25  Glucose-Capillary Latest Ref Range: 65-99 mg/dL 246 (H) 239 (H) 306 (H) 326 (H) 207 (H)   Review of Glycemic Control  Diabetes history: DM2 Outpatient Diabetes medications: Toujeo 50 units QHS, Victoza 0.6 mg QAM, Metformin 500 mg BID Current orders for Inpatient glycemic control: Lantus 50 units QHS, Novolog 0-15 units TID with meals, Novolog 0-5 units QHS  Inpatient Diabetes Program Recommendations: Insulin - Basal: Please consider increasing Lantus to 53 units QHS. Insulin - Meal Coverage: Post prandial glucose is consistently elevated. Please consider ordering Novolog 5 units TID with meals for meal coverage.  Thanks, Barnie Alderman, RN, MSN, CDE Diabetes Coordinator Inpatient Diabetes Program [phone] (Team Pager from [place] to [place]) [phone] ([place] office) [phone] [facility] office) [phone] [facility] office)

## 2015-12-28 LAB — GLUCOSE, CAPILLARY
GLUCOSE-CAPILLARY: 210 mg/dL — AB (ref 65–99)
GLUCOSE-CAPILLARY: 297 mg/dL — AB (ref 65–99)
Glucose-Capillary: 152 mg/dL — ABNORMAL HIGH (ref 65–99)
Glucose-Capillary: 299 mg/dL — ABNORMAL HIGH (ref 65–99)

## 2015-12-28 NOTE — Care Management Note (Signed)
Case Management Note  Patient Details  Name: Megan Schroeder MRN: [id number] Date of Birth: 20-Mar-1946  Subjective/Objective:   Spoke with patient for discharge planning patient is from home and independent. Drives self to appointments and uses no assistive devices or home O2, Is diabetic and stated that she is able to afford and get her medications.   Still has very bad facial swelling from cellulitis. Continues with IVABX and fluids. Patient stated that she is looking forward to discharging soon. No CM needs identified. Sign off                 Action/Plan:Home wit self care.   Expected Discharge Date:  12/26/15               Expected Discharge Plan:  Home/Self Care  In-House Referral:     Discharge planning Services  CM Consult  Post Acute Care Choice:  NA Choice offered to:  NA  DME Arranged:  N/A DME Agency:     HH Arranged:    HH Agency:     Status of Service:  Completed, signed off  If discussed at [place] of Stay Meetings, dates discussed:    Additional Comments:  Alvie Heidelberg, RN 12/28/2015, 1:49 PM

## 2015-12-28 NOTE — Progress Notes (Signed)
Less facial erythema and tension then yesterday in fair glycemic control with sliding scale Megan Schroeder [id number] DOB: [birth year] DOA: 12/24/2015 PCP: Maricela Curet, MD   Physical Exam: Blood pressure 144/88, pulse 83, temperature 98.6 F (37 C), temperature source Oral, resp. rate 18, height 5\' 5"  (1.651 m), weight 76.34 kg (168 lb 4.8 oz), SpO2 100 %. Lungs clear to A&P no rales wheeze rhonchi heart regular rhythm no murmurs gallops or rubs right facial erythema and rubor noted less pronounced than yesterday continue clindamycin   Investigations:  Recent Results (from the past 240 hour(s))  Urine culture     Status: Abnormal   Collection Time: 12/24/15  8:22 AM  Result Value Ref Range Status   Specimen Description URINE, CLEAN CATCH  Final   Special Requests Normal  Final   Culture MULTIPLE SPECIES PRESENT, SUGGEST RECOLLECTION (A)  Final   Report Status 12/27/2015 FINAL  Final     Basic Metabolic Panel: No results for input(s): NA, K, CL, CO2, GLUCOSE, BUN, CREATININE, CALCIUM, MG, PHOS in the last 72 hours. Liver Function Tests: No results for input(s): AST, ALT, ALKPHOS, BILITOT, PROT, ALBUMIN in the last 72 hours.   CBC:  Recent Labs  12/27/15 0719  WBC 5.8  NEUTROABS 3.3  HGB 11.1*  HCT 33.3*  MCV 84.9  PLT 173    No results found.    Medications:   Impression:  Active Problems:   Hypertension   Diabetes mellitus ([place])   Hyperlipidemia   Facial cellulitis   Hypothyroidism     Plan: Continue IV clindamycin for at least 48 more hours then consider by mouth  Consultants:     Procedures   Antibiotics: Clindamycin 600 IV every 8 hours          Time spent: 30 minutes   LOS: 4 days   Elsy Chiang M   12/28/2015, 10:43 AM

## 2015-12-28 NOTE — Care Management Important Message (Signed)
Important Message  Patient Details  Name: Megan Schroeder MRN: [id number] Date of Birth: 06/25/1945   Medicare Important Message Given:  Yes    Alvie Heidelberg, RN 12/28/2015, 8:55 AM

## 2015-12-28 NOTE — Progress Notes (Signed)
Inpatient Diabetes Program Recommendations  AACE/ADA: New Consensus Statement on Inpatient Glycemic Control (2015)  Target Ranges:  Prepandial:   less than 140 mg/dL      Peak postprandial:   less than 180 mg/dL (1-2 hours)      Critically ill patients:  140 - 180 mg/dL   Results for Megan Schroeder, Megan Schroeder (MRN [id number]) as of 12/28/2015 12:32  Ref. Range 12/27/2015 07:25 12/27/2015 11:56 12/27/2015 16:37 12/27/2015 20:08 12/27/2015 21:59 12/28/2015 07:19 12/28/2015 11:16  Glucose-Capillary Latest Ref Range: 65-99 mg/dL 207 (H) 295 (H) 167 (H) 260 (H) 272 (H) 152 (H) 299 (H)   Review of Glycemic Control  Diabetes history: DM2 Outpatient Diabetes medications: Toujeo 50 units QHS, Victoza 0.6 mg QAM, Metformin 500 mg BID Current orders for Inpatient glycemic control: Lantus 50 units QHS, Novolog 0-15 units TID with meals, Novolog 0-5 units QHS  Inpatient Diabetes Program Recommendations: Insulin - Meal Coverage: Post prandial glucose is consistently elevated. Please consider ordering Novolog 5 units TID with meals for meal coverage.  Thanks, Barnie Alderman, RN, MSN, CDE Diabetes Coordinator Inpatient Diabetes Program [phone] (Team Pager from [place] to [place]) [phone] ([place] office) [phone] [facility] office) [phone] [facility] office)

## 2015-12-29 LAB — GLUCOSE, CAPILLARY
GLUCOSE-CAPILLARY: 118 mg/dL — AB (ref 65–99)
Glucose-Capillary: 261 mg/dL — ABNORMAL HIGH (ref 65–99)
Glucose-Capillary: 298 mg/dL — ABNORMAL HIGH (ref 65–99)
Glucose-Capillary: 301 mg/dL — ABNORMAL HIGH (ref 65–99)

## 2015-12-29 NOTE — Progress Notes (Signed)
Continue to improve less tension less erythema and less rubor Megan Schroeder [id number] DOB: 11/22/1945 DOA: 12/24/2015 PCP: Maricela Curet, MD   Physical Exam: Blood pressure 152/75, pulse 88, temperature 98.2 F (36.8 C), temperature source Oral, resp. rate 18, height 5\' 5"  (1.651 m), weight 76.34 kg (168 lb 4.8 oz), SpO2 99 %. Lungs clear to A&P no rales wheeze rhonchi heart regular rhythm no murmurs gallops he feels rubs abdomen soft nontender bowel sounds normoactive   Investigations:  Recent Results (from the past 240 hour(s))  Urine culture     Status: Abnormal   Collection Time: 12/24/15  8:22 AM  Result Value Ref Range Status   Specimen Description URINE, CLEAN CATCH  Final   Special Requests Normal  Final   Culture MULTIPLE SPECIES PRESENT, SUGGEST RECOLLECTION (A)  Final   Report Status 12/27/2015 FINAL  Final     Basic Metabolic Panel: No results for input(s): NA, K, CL, CO2, GLUCOSE, BUN, CREATININE, CALCIUM, MG, PHOS in the last 72 hours. Liver Function Tests: No results for input(s): AST, ALT, ALKPHOS, BILITOT, PROT, ALBUMIN in the last 72 hours.   CBC:  Recent Labs  12/27/15 0719  WBC 5.8  NEUTROABS 3.3  HGB 11.1*  HCT 33.3*  MCV 84.9  PLT 173    No results found.    Medications:   Impression:  Active Problems:   Hypertension   Diabetes mellitus ([place])   Hyperlipidemia   Facial cellulitis   Hypothyroidism     Plan: Continue IV clindamycin will consider discharge in a.m. and switch to oral clindamycin with close follow-up in office  Consultants:    Procedures   Antibiotics: Clindamycin 600 mg IV every 8 hours     Time spent: 30 minutes   LOS: 5 days   Dmetrius Ambs M   12/29/2015, 12:42 PM

## 2015-12-30 LAB — GLUCOSE, CAPILLARY: Glucose-Capillary: 134 mg/dL — ABNORMAL HIGH (ref 65–99)

## 2015-12-30 MED ORDER — CLINDAMYCIN HCL 300 MG PO CAPS: 300.0000 mg | ORAL_CAPSULE | Freq: Three times a day (TID) | ORAL | Status: DC

## 2015-12-30 MED ORDER — CLINDAMYCIN HCL 150 MG PO CAPS: 300.0000 mg | ORAL_CAPSULE | Freq: Three times a day (TID) | ORAL | Status: DC

## 2015-12-30 NOTE — Discharge Summary (Signed)
Physician Discharge Summary  Megan Schroeder [id number] DOB: November 30, 1945 DOA: 12/24/2015  PCP: Maricela Curet, MD  Admit date: 12/24/2015 Discharge date: 12/30/2015   Recommendations for Outpatient Follow-up:  The patient is advised to take clindamycin 300 mg by mouth 3 times a day for an additional 10 days follow-up my office in 4-5 days times to assess resolution of facial cellulitis as well as to assess glycemic control hypertension and hyperlipidemic control Discharge Diagnoses:  Active Problems:   Hypertension   Diabetes mellitus ([place])   Hyperlipidemia   Facial cellulitis   Hypothyroidism   Discharge Condition: Good  Filed Weights   12/24/15 1354 12/28/15 0443 12/30/15 0636  Weight: 75.297 kg (166 lb) 76.34 kg (168 lb 4.8 oz) 76.21 kg (168 lb 0.2 oz)    History of present illness:  The patient is 70 year old white female known anxiety disorder insulin-dependent diabetic hypertensive hyperlipidemic who was picking out of phase with a needle and had been ensuing rapid expansion of facial maxillary cellulitis with ensuing rubor erythema and swelling is minutes in the hospital CT scan revealed no infiltration and retro-orbital he was started on IV clindamycin 600 mg every 8 hours there was a gradual resolution of the swelling erythema and rubor and tenderness over a 6-7 day period was felt safe to start her on by mouth clindamycin 30 mg by mouth 3 times a day for additional 10 days with close follow-up as an outpatient she is likewise to take all her prehospital admission insulin and antihypertensive medicines  Hospital Course:  See history of present illness above  Procedures:     Consultations:    Discharge Instructions  Discharge Instructions    Discharge instructions    Complete by:  As directed      Discharge patient    Complete by:  As directed             Medication List    TAKE these medications        amLODipine 10 MG tablet  Commonly known  as:  NORVASC  Take 10 mg by mouth every evening.     aspirin EC 81 MG tablet  Take 81 mg by mouth every evening.     atorvastatin 20 MG tablet  Commonly known as:  LIPITOR  Take 20 mg by mouth every evening.     cholecalciferol 1000 units tablet  Commonly known as:  VITAMIN D  Take 1,000 Units by mouth every morning.     clindamycin 300 MG capsule  Commonly known as:  CLEOCIN  Take 1 capsule (300 mg total) by mouth every 8 (eight) hours.     fish oil-omega-3 fatty acids 1000 MG capsule  Take 1 g by mouth every morning.     levothyroxine 75 MCG tablet  Commonly known as:  SYNTHROID, LEVOTHROID  Take 75 mcg by mouth every morning.     metFORMIN 500 MG tablet  Commonly known as:  GLUCOPHAGE  Take 1 tablet (500 mg total) by mouth 2 (two) times daily with a meal.     olmesartan-hydrochlorothiazide 40-12.5 MG tablet  Commonly known as:  BENICAR HCT  Take 1 tablet by mouth every morning.     TOUJEO SOLOSTAR [place]  Inject into the skin. 50 UNITS AT BEDTIME     VICTOZA 18 MG/3ML Sopn  Generic drug:  Liraglutide  Inject 0.6 mg into the skin every morning.           The results of significant diagnostics from this  hospitalization (including imaging, microbiology, ancillary and laboratory) are listed below for reference.    Significant Diagnostic Studies: Ct Maxillofacial W/cm  12/24/2015  CLINICAL DATA:  70 year old female with a history of right-sided facial swelling EXAM: CT MAXILLOFACIAL WITH CONTRAST TECHNIQUE: Multidetector CT imaging of the maxillofacial structures was performed with intravenous contrast. Multiplanar CT image reconstructions were also generated. A small metallic BB was placed on the right temple in order to reliably differentiate right from left. CONTRAST:  65mL ISOVUE-300 IOPAMIDOL (ISOVUE-300) INJECTION 61% COMPARISON:  None. FINDINGS: Mandible: No acute fracture identified. Temporomandibular joints approximated. Patient is edentulous. Mid face: No acute  bony abnormality identified. Paranasal sinuses: Patency of the frontal sinuses, ethmoid air cells, bilateral maxillary sinuses, sphenoid sinuses. Mastoid air cells are clear. No fluid within the middle ear on the left or the right. Soft tissue thickening in the superficial soft tissues of the right face, including the right temporal region, right supraorbital soft tissues, periorbital soft tissue, malar soft tissues. No focal fluid collection or evidence of abscess. The right-sided periorbital soft tissue infiltration does not extend into the orbit, with unremarkable intraconal and extraconal fat density. Multiple head and neck lymph nodes are present bilaterally, none of which are suspicious by CT size criteria or configuration. Orbits:  Unremarkable appearance the bilateral orbits. Unremarkable appearance the bilateral globes. Unremarkable appearance of the visualized intracranial structures. Intracranial atherosclerosis. Degenerative changes of the cervical spine. No bony canal narrowing. IMPRESSION: Infiltration of the fat and soft tissues of the right face involving the temple region and periorbital soft tissues (including supraorbital and infraorbital soft tissues), compatible with cellulitis, with no evidence of abscess or focal fluid collection. Signed, Dulcy Fanny. Earleen Newport, DO [facility] Radiology Electronically Signed   By: Corrie Mckusick D.O.   On: 12/24/2015 11:08    Microbiology: Recent Results (from the past 240 hour(s))  Urine culture     Status: Abnormal   Collection Time: 12/24/15  8:22 AM  Result Value Ref Range Status   Specimen Description URINE, CLEAN CATCH  Final   Special Requests Normal  Final   Culture MULTIPLE SPECIES PRESENT, SUGGEST RECOLLECTION (A)  Final   Report Status 12/27/2015 FINAL  Final     Labs: Basic Metabolic Panel:  Recent Labs Lab 12/24/15 0825 12/25/15 0605  NA 135 136  K 3.5 3.4*  CL 99* 101  CO2 27 25    GLUCOSE 323* 186*  BUN 10 14  CREATININE 0.55 0.55  CALCIUM 9.6 8.5*   Liver Function Tests:  Recent Labs Lab 12/24/15 0825  AST 25  ALT 28  ALKPHOS 73  BILITOT 0.8  PROT 8.0  ALBUMIN 4.2   No results for input(s): LIPASE, AMYLASE in the last 168 hours. No results for input(s): AMMONIA in the last 168 hours. CBC:  Recent Labs Lab 12/24/15 0825 12/25/15 0605 12/27/15 0719  WBC 11.2* 9.6 5.8  NEUTROABS 8.3*  --  3.3  HGB 12.6 11.1* 11.1*  HCT 37.9 33.7* 33.3*  MCV 84.0 84.9 84.9  PLT 181 172 173   Cardiac Enzymes: No results for input(s): CKTOTAL, CKMB, CKMBINDEX, TROPONINI in the last 168 hours. BNP: BNP (last 3 results) No results for input(s): BNP in the last 8760 hours.  ProBNP (last 3 results) No results for input(s): PROBNP in the last 8760 hours.  CBG:  Recent Labs Lab 12/28/15 2036 12/29/15 0720 12/29/15 1150 12/29/15 1705 12/29/15 2113  GLUCAP 297* 118* 261* 298* 301*  Signed:  Kaysee Hergert Jerilynn Mages  [facility] Pager: [phone] 12/30/2015, 7:04 AM

## 2015-12-30 NOTE — Discharge Planning (Signed)
Patient IV removed.  DC papers given, explained and educated.  Given script.  No FU appts suggested unless needed.  RN assessment and VS revealed stability for DC to home.  Will be wheeled to front and family transporting home via car.

## 2016-08-21 ENCOUNTER — Other Ambulatory Visit: Payer: Self-pay | Admitting: Family Medicine

## 2016-08-21 DIAGNOSIS — Z1231 Encounter for screening mammogram for malignant neoplasm of breast: Secondary | ICD-10-CM

## 2016-09-07 ENCOUNTER — Ambulatory Visit
Admission: RE | Admit: 2016-09-07 | Discharge: 2016-09-07 | Disposition: A | Discharge status: Home / Self Care | Payer: Medicare Other | Source: Ambulatory Visit | Attending: Family Medicine | Admitting: Family Medicine

## 2016-09-07 DIAGNOSIS — Z1231 Encounter for screening mammogram for malignant neoplasm of breast: Secondary | ICD-10-CM

## 2017-06-11 HISTORY — PX: EYE SURGERY: SHX253

## 2017-12-09 NOTE — Patient Instructions (Addendum)
Your procedure is scheduled on: 12/16/2017  Report to [facility] at  [phone] AM.  Call this number if you have problems the morning of surgery: [phone]   Do not eat food or drink liquids :After Midnight.      Take these medicines the morning of surgery with A SIP OF WATER: levothyroxine and norvasc    Do not wear jewelry, make-up or nail polish.  Do not wear lotions, powders, or perfumes. You may wear deodorant.  Do not shave 48 hours prior to surgery.  Do not bring valuables to the hospital.  Contacts, dentures or bridgework may not be worn into surgery.  Leave suitcase in the car. After surgery it may be brought to your room.  For patients admitted to the hospital, checkout time is 11:00 AM the day of discharge.   Patients discharged the day of surgery will not be allowed to drive home.  :     Please read over the following fact sheets that you were given: Coughing and Deep Breathing, Surgical Site Infection Prevention, Anesthesia Post-op Instructions and Care and Recovery After Surgery    Cataract A cataract is a clouding of the lens of the eye. When a lens becomes cloudy, vision is reduced based on the degree and nature of the clouding. Many cataracts reduce vision to some degree. Some cataracts make people more near-sighted as they develop. Other cataracts increase glare. Cataracts that are ignored and become worse can sometimes look white. The white color can be seen through the pupil. CAUSES   Aging. However, cataracts may occur at any age, even in newborns.   Certain drugs.   Trauma to the eye.   Certain diseases such as diabetes.   Specific eye diseases such as chronic inflammation inside the eye or a sudden attack of a rare form of glaucoma.   Inherited or acquired medical problems.  SYMPTOMS   Gradual, progressive drop in vision in the affected eye.   Severe, rapid visual loss. This most often happens when trauma is the cause.  DIAGNOSIS  To detect a cataract, an  eye doctor examines the lens. Cataracts are best diagnosed with an exam of the eyes with the pupils enlarged (dilated) by drops.  TREATMENT  For an early cataract, vision may improve by using different eyeglasses or stronger lighting. If that does not help your vision, surgery is the only effective treatment. A cataract needs to be surgically removed when vision loss interferes with your everyday activities, such as driving, reading, or watching TV. A cataract may also have to be removed if it prevents examination or treatment of another eye problem. Surgery removes the cloudy lens and usually replaces it with a substitute lens (intraocular lens, IOL).  At a time when both you and your doctor agree, the cataract will be surgically removed. If you have cataracts in both eyes, only one is usually removed at a time. This allows the operated eye to heal and be out of danger from any possible problems after surgery (such as infection or poor wound healing). In rare cases, a cataract may be doing damage to your eye. In these cases, your caregiver may advise surgical removal right away. The vast majority of people who have cataract surgery have better vision afterward. HOME CARE INSTRUCTIONS  If you are not planning surgery, you may be asked to do the following:  Use different eyeglasses.   Use stronger or brighter lighting.   Ask your eye doctor about reducing your medicine  dose or changing medicines if it is thought that a medicine caused your cataract. Changing medicines does not make the cataract go away on its own.   Become familiar with your surroundings. Poor vision can lead to injury. Avoid bumping into things on the affected side. You are at a higher risk for tripping or falling.   Exercise extreme care when driving or operating machinery.   Wear sunglasses if you are sensitive to bright light or experiencing problems with glare.  SEEK IMMEDIATE MEDICAL CARE IF:   You have a worsening or sudden  vision loss.   You notice redness, swelling, or increasing pain in the eye.   You have a fever.  Document Released: 05/28/2005 Document Revised: 05/17/2011 Document Reviewed: 01/19/2011 [facility] Patient Information 2012 [place].PATIENT INSTRUCTIONS POST-ANESTHESIA  IMMEDIATELY FOLLOWING SURGERY:  Do not drive or operate machinery for the first twenty four hours after surgery.  Do not make any important decisions for twenty four hours after surgery or while taking narcotic pain medications or sedatives.  If you develop intractable nausea and vomiting or a severe headache please notify your doctor immediately.  FOLLOW-UP:  Please make an appointment with your surgeon as instructed. You do not need to follow up with anesthesia unless specifically instructed to do so.  WOUND CARE INSTRUCTIONS (if applicable):  Keep a dry clean dressing on the anesthesia/puncture wound site if there is drainage.  Once the wound has quit draining you may leave it open to air.  Generally you should leave the bandage intact for twenty four hours unless there is drainage.  If the epidural site drains for more than 36-48 hours please call the anesthesia department.  QUESTIONS?:  Please feel free to call your physician or the hospital operator if you have any questions, and they will be happy to assist you.         Diabetes Mellitus and Nutrition When you have diabetes (diabetes mellitus), it is very important to have healthy eating habits because your blood sugar (glucose) levels are greatly affected by what you eat and drink. Eating healthy foods in the appropriate amounts, at about the same times every day, can help you:  Control your blood glucose.  Lower your risk of heart disease.  Improve your blood pressure.  Reach or maintain a healthy weight.  Every person with diabetes is different, and each person has different needs for a meal plan. Your health care provider may recommend that you work with  a diet and nutrition specialist (dietitian) to make a meal plan that is best for you. Your meal plan may vary depending on factors such as:  The calories you need.  The medicines you take.  Your weight.  Your blood glucose, blood pressure, and cholesterol levels.  Your activity level.  Other health conditions you have, such as heart or kidney disease.  How do carbohydrates affect me? Carbohydrates affect your blood glucose level more than any other type of food. Eating carbohydrates naturally increases the amount of glucose in your blood. Carbohydrate counting is a method for keeping track of how many carbohydrates you eat. Counting carbohydrates is important to keep your blood glucose at a healthy level, especially if you use insulin or take certain oral diabetes medicines. It is important to know how many carbohydrates you can safely have in each meal. This is different for every person. Your dietitian can help you calculate how many carbohydrates you should have at each meal and for snack. Foods that contain carbohydrates  include:  Bread, cereal, rice, pasta, and crackers.  Potatoes and corn.  Peas, beans, and lentils.  Milk and yogurt.  Fruit and juice.  Desserts, such as cakes, cookies, ice cream, and candy.  How does alcohol affect me? Alcohol can cause a sudden decrease in blood glucose (hypoglycemia), especially if you use insulin or take certain oral diabetes medicines. Hypoglycemia can be a life-threatening condition. Symptoms of hypoglycemia (sleepiness, dizziness, and confusion) are similar to symptoms of having too much alcohol. If your health care provider says that alcohol is safe for you, follow these guidelines:  Limit alcohol intake to no more than 1 drink per day for nonpregnant women and 2 drinks per day for men. One drink equals 12 oz of beer, 5 oz of wine, or 1 oz of hard liquor.  Do not drink on an empty stomach.  Keep yourself hydrated with water, diet  soda, or unsweetened iced tea.  Keep in mind that regular soda, juice, and other mixers may contain a lot of sugar and must be counted as carbohydrates.  What are tips for following this plan? Reading food labels  Start by checking the serving size on the label. The amount of calories, carbohydrates, fats, and other nutrients listed on the label are based on one serving of the food. Many foods contain more than one serving per package.  Check the total grams (g) of carbohydrates in one serving. You can calculate the number of servings of carbohydrates in one serving by dividing the total carbohydrates by 15. For example, if a food has 30 g of total carbohydrates, it would be equal to 2 servings of carbohydrates.  Check the number of grams (g) of saturated and trans fats in one serving. Choose foods that have low or no amount of these fats.  Check the number of milligrams (mg) of sodium in one serving. Most people should limit total sodium intake to less than 2,300 mg per day.  Always check the nutrition information of foods labeled as "low-fat" or "nonfat". These foods may be higher in added sugar or refined carbohydrates and should be avoided.  Talk to your dietitian to identify your daily goals for nutrients listed on the label. Shopping  Avoid buying canned, premade, or processed foods. These foods tend to be high in fat, sodium, and added sugar.  Shop around the outside edge of the grocery store. This includes fresh fruits and vegetables, bulk grains, fresh meats, and fresh dairy. Cooking  Use low-heat cooking methods, such as baking, instead of high-heat cooking methods like deep frying.  Cook using healthy oils, such as olive, canola, or sunflower oil.  Avoid cooking with butter, cream, or high-fat meats. Meal planning  Eat meals and snacks regularly, preferably at the same times every day. Avoid going long periods of time without eating.  Eat foods high in fiber, such as  fresh fruits, vegetables, beans, and whole grains. Talk to your dietitian about how many servings of carbohydrates you can eat at each meal.  Eat 4-6 ounces of lean protein each day, such as lean meat, chicken, fish, eggs, or tofu. 1 ounce is equal to 1 ounce of meat, chicken, or fish, 1 egg, or 1/4 cup of tofu.  Eat some foods each day that contain healthy fats, such as avocado, nuts, seeds, and fish. Lifestyle   Check your blood glucose regularly.  Exercise at least 30 minutes 5 or more days each week, or as told by your health care provider.  Take  medicines as told by your health care provider.  Do not use any products that contain nicotine or tobacco, such as cigarettes and e-cigarettes. If you need help quitting, ask your health care provider.  Work with a Social worker or diabetes educator to identify strategies to manage stress and any emotional and social challenges. What are some questions to ask my health care provider?  Do I need to meet with a diabetes educator?  Do I need to meet with a dietitian?  What number can I call if I have questions?  When are the best times to check my blood glucose? Where to find more information:  American Diabetes Association: diabetes.org/food-and-fitness/food  Academy of Nutrition and Dietetics: PokerClues.dk  [employer] of Diabetes and Digestive and Kidney Diseases (NIH): ContactWire.be Summary  A healthy meal plan will help you control your blood glucose and maintain a healthy lifestyle.  Working with a diet and nutrition specialist (dietitian) can help you make a meal plan that is best for you.  Keep in mind that carbohydrates and alcohol have immediate effects on your blood glucose levels. It is important to count carbohydrates and to use alcohol carefully. This information is not intended to replace  advice given to you by your health care provider. Make sure you discuss any questions you have with your health care provider. Document Released: 02/22/2005 Document Revised: 07/02/2016 Document Reviewed: 07/02/2016 Elsevier Interactive Patient Education  [employer].

## 2017-12-10 ENCOUNTER — Encounter (HOSPITAL_COMMUNITY)
Admission: RE | Admit: 2017-12-10 | Discharge: 2017-12-10 | Disposition: A | Discharge status: Home / Self Care | Payer: Medicare Other | Source: Ambulatory Visit | Attending: Ophthalmology | Admitting: Ophthalmology

## 2017-12-10 ENCOUNTER — Other Ambulatory Visit: Payer: Self-pay

## 2017-12-10 ENCOUNTER — Encounter (HOSPITAL_COMMUNITY): Payer: Self-pay

## 2017-12-10 DIAGNOSIS — R000 Tachycardia, unspecified: Secondary | ICD-10-CM | POA: Insufficient documentation

## 2017-12-10 DIAGNOSIS — Z01812 Encounter for preprocedural laboratory examination: Secondary | ICD-10-CM | POA: Diagnosis present

## 2017-12-10 DIAGNOSIS — Z01810 Encounter for preprocedural cardiovascular examination: Secondary | ICD-10-CM | POA: Diagnosis not present

## 2017-12-10 LAB — CBC WITH DIFFERENTIAL/PLATELET
BASOS PCT: 0 %
Basophils Absolute: 0 10*3/uL (ref 0.0–0.1)
Eosinophils Absolute: 0.2 10*3/uL (ref 0.0–0.7)
Eosinophils Relative: 2 %
HEMATOCRIT: 38.8 % (ref 36.0–46.0)
HEMOGLOBIN: 13.1 g/dL (ref 12.0–15.0)
Lymphocytes Relative: 32 %
Lymphs Abs: 2.9 10*3/uL (ref 0.7–4.0)
MCH: 28.9 pg (ref 26.0–34.0)
MCHC: 33.8 g/dL (ref 30.0–36.0)
MCV: 85.5 fL (ref 78.0–100.0)
Monocytes Absolute: 0.6 10*3/uL (ref 0.1–1.0)
Monocytes Relative: 7 %
NEUTROS ABS: 5.3 10*3/uL (ref 1.7–7.7)
NEUTROS PCT: 59 %
Platelets: 190 10*3/uL (ref 150–400)
RBC: 4.54 MIL/uL (ref 3.87–5.11)
RDW: 13.7 % (ref 11.5–15.5)
WBC: 8.9 10*3/uL (ref 4.0–10.5)

## 2017-12-10 LAB — GLUCOSE, CAPILLARY: Glucose-Capillary: 259 mg/dL — ABNORMAL HIGH (ref 70–99)

## 2017-12-10 LAB — BASIC METABOLIC PANEL
Anion gap: 13 (ref 5–15)
BUN: 17 mg/dL (ref 8–23)
CHLORIDE: 98 mmol/L (ref 98–111)
CO2: 23 mmol/L (ref 22–32)
Calcium: 9.6 mg/dL (ref 8.9–10.3)
Creatinine, Ser: 0.66 mg/dL (ref 0.44–1.00)
GFR calc Af Amer: >60 mL/min (ref >60)
GFR calc non Af Amer: >60 mL/min (ref >60)
GLUCOSE: 253 mg/dL — AB (ref 70–99)
Potassium: 3.4 mmol/L — ABNORMAL LOW (ref 3.5–5.1)
Sodium: 134 mmol/L — ABNORMAL LOW (ref 135–145)

## 2017-12-10 LAB — HEMOGLOBIN A1C
Hgb A1c MFr Bld: 10 % — ABNORMAL HIGH (ref 4.8–5.6)
Mean Plasma Glucose: 240.3 mg/dL

## 2017-12-16 ENCOUNTER — Encounter (HOSPITAL_COMMUNITY): Payer: Self-pay | Admitting: *Deleted

## 2017-12-16 ENCOUNTER — Encounter (HOSPITAL_COMMUNITY)
Admission: RE | Disposition: A | Discharge status: Home / Self Care | Payer: Self-pay | Source: Ambulatory Visit | Attending: Ophthalmology

## 2017-12-16 ENCOUNTER — Ambulatory Visit (HOSPITAL_COMMUNITY)
Admission: RE | Admit: 2017-12-16 | Discharge: 2017-12-16 | Disposition: A | Discharge status: Home / Self Care | Payer: Medicare Other | Source: Ambulatory Visit | Attending: Ophthalmology | Admitting: Ophthalmology

## 2017-12-16 ENCOUNTER — Ambulatory Visit (HOSPITAL_COMMUNITY): Payer: Medicare Other | Admitting: Anesthesiology

## 2017-12-16 DIAGNOSIS — I10 Essential (primary) hypertension: Secondary | ICD-10-CM | POA: Insufficient documentation

## 2017-12-16 DIAGNOSIS — H25812 Combined forms of age-related cataract, left eye: Secondary | ICD-10-CM | POA: Diagnosis not present

## 2017-12-16 HISTORY — PX: CATARACT EXTRACTION W/PHACO: SHX586

## 2017-12-16 LAB — GLUCOSE, CAPILLARY: GLUCOSE-CAPILLARY: 200 mg/dL — AB (ref 70–99)

## 2017-12-16 SURGERY — PHACOEMULSIFICATION, CATARACT, WITH IOL INSERTION
Anesthesia: General | Site: Eye | Laterality: Left

## 2017-12-16 MED ORDER — LACTATED RINGERS IV SOLN
INTRAVENOUS | Status: DC
  Administered 2017-12-16: 1000 mL via INTRAVENOUS

## 2017-12-16 MED ORDER — LIDOCAINE HCL 3.5 % OP GEL
1.0000 "application " | Freq: Once | OPHTHALMIC | Status: AC
  Administered 2017-12-16: 1 via OPHTHALMIC

## 2017-12-16 MED ORDER — NEOMYCIN-POLYMYXIN-DEXAMETH 3.5-10000-0.1 OP SUSP
OPHTHALMIC | Status: DC | PRN
  Administered 2017-12-16: 2 [drp] via OPHTHALMIC

## 2017-12-16 MED ORDER — MIDAZOLAM HCL 5 MG/5ML IJ SOLN
INTRAMUSCULAR | Status: DC | PRN
  Administered 2017-12-16 (×2): 1 mg via INTRAVENOUS

## 2017-12-16 MED ORDER — LIDOCAINE HCL (PF) 1 % IJ SOLN
INTRAMUSCULAR | Status: DC | PRN
  Administered 2017-12-16: .5 mL

## 2017-12-16 MED ORDER — POVIDONE-IODINE 5 % OP SOLN
OPHTHALMIC | Status: DC | PRN
  Administered 2017-12-16: 1 via OPHTHALMIC

## 2017-12-16 MED ORDER — MIDAZOLAM HCL 2 MG/2ML IJ SOLN
INTRAMUSCULAR | Status: AC
  Filled 2017-12-16: qty 2

## 2017-12-16 MED ORDER — BSS IO SOLN
INTRAOCULAR | Status: DC | PRN
  Administered 2017-12-16: 500 mL

## 2017-12-16 MED ORDER — TETRACAINE HCL 0.5 % OP SOLN
1.0000 [drp] | OPHTHALMIC | Status: AC
  Administered 2017-12-16 (×3): 1 [drp] via OPHTHALMIC

## 2017-12-16 MED ORDER — PROVISC 10 MG/ML IO SOLN
INTRAOCULAR | Status: DC | PRN
  Administered 2017-12-16: 0.85 mL via INTRAOCULAR

## 2017-12-16 MED ORDER — BSS IO SOLN
INTRAOCULAR | Status: DC | PRN
  Administered 2017-12-16: 15 mL

## 2017-12-16 MED ORDER — PHENYLEPHRINE HCL 2.5 % OP SOLN
1.0000 [drp] | OPHTHALMIC | Status: AC
  Administered 2017-12-16 (×3): 1 [drp] via OPHTHALMIC

## 2017-12-16 MED ORDER — CYCLOPENTOLATE-PHENYLEPHRINE 0.2-1 % OP SOLN
1.0000 [drp] | OPHTHALMIC | Status: AC
  Administered 2017-12-16 (×3): 1 [drp] via OPHTHALMIC

## 2017-12-16 SURGICAL SUPPLY — 10 items

## 2017-12-16 NOTE — H&P (Signed)
I have reviewed the H&P, the patient was re-examined, and I have identified no interval changes in medical condition and plan of care since the history and physical of record  

## 2017-12-16 NOTE — Op Note (Signed)
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  Assistants: None  Anesthesia: Topical with MAC  Indications: Painless, progressive loss of vision with compromise of daily activities.  Surgery: Cataract Extraction with Intraocular lens Implant Left Eye  Discription: The patient had dilating drops and viscous lidocaine placed into the Left eye in the pre-op holding area. After transfer to the operating room, a time out was performed. The patient was then prepped and draped. Beginning with a 98m blade a paracentesis port was made at the surgeon's 2 o'clock position. The anterior chamber was then filled with 1% non-preserved lidocaine. This was followed by filling the anterior chamber with Provisc.  A 2.476mkeratome blade was used to make a clear corneal incision at the temporal limbus.  A bent cystatome needle was used to create a continuous tear capsulotomy. Hydrodissection was performed with balanced salt solution on a Fine canula. The lens nucleus was then removed using the phacoemulsification handpiece. Residual cortex was removed with the I&A handpiece. The anterior chamber and capsular bag were refilled with Provisc. A posterior chamber intraocular lens was placed into the capsular bag with it's injector. The implant was positioned with the Kuglan hook. The Provisc was then removed from the anterior chamber and capsular bag with the I&A handpiece. Stromal hydration of the main incision and paracentesis port was performed with BSS on a Fine canula. The wounds were tested for leak which was negative. The patient tolerated the procedure well. There were no operative complications. The patient was then transferred to the recovery room in stable condition.  Complications: None  Specimen: None  EBL: None  Prosthetic device: J&J Technis, PCB00, power 22.0, SN  [id number]

## 2017-12-16 NOTE — Anesthesia Postprocedure Evaluation (Signed)
Anesthesia Post Note  Patient: Megan Schroeder  Procedure(s) Performed: CATARACT EXTRACTION PHACO AND INTRAOCULAR LENS PLACEMENT (IOC) (Left Eye)  Patient location during evaluation: Short Stay Anesthesia Type: General Level of consciousness: awake Pain management: pain level controlled Vital Signs Assessment: post-procedure vital signs reviewed and stable Respiratory status: spontaneous breathing Cardiovascular status: stable Postop Assessment: no apparent nausea or vomiting Anesthetic complications: no     Last Vitals: There were no vitals filed for this visit.  Last Pain: There were no vitals filed for this visit.               Linley Moxley

## 2017-12-16 NOTE — Transfer of Care (Signed)
Immediate Anesthesia Transfer of Care Note  Patient: Megan Schroeder  Procedure(s) Performed: CATARACT EXTRACTION PHACO AND INTRAOCULAR LENS PLACEMENT (IOC) (Left Eye)  Patient Location: Short Stay  Anesthesia Type:MAC  Level of Consciousness: awake  Airway & Oxygen Therapy: Patient Spontanous Breathing  Post-op Assessment: Report given to RN  Post vital signs: Reviewed  Last Vitals:  Vitals Value Taken Time  BP    Temp    Pulse    Resp    SpO2      Last Pain: There were no vitals filed for this visit.       Complications: No apparent anesthesia complications

## 2017-12-16 NOTE — Discharge Instructions (Signed)

## 2017-12-16 NOTE — Anesthesia Preprocedure Evaluation (Signed)
Anesthesia Evaluation  Patient identified by MRN, date of birth, ID band Patient awake    Reviewed: Allergy & Precautions, H&P , NPO status , Patient's Chart, lab work & pertinent test results, reviewed documented beta blocker date and time   Airway Mallampati: II  TM Distance: >3 FB Neck ROM: full    Dental no notable dental hx. (+) Teeth Intact   Pulmonary neg pulmonary ROS,    Pulmonary exam normal breath sounds clear to auscultation       Cardiovascular Exercise Tolerance: Good hypertension, On Medications negative cardio ROS   Rhythm:regular Rate:Normal     Neuro/Psych  Headaches, negative neurological ROS  negative psych ROS   GI/Hepatic negative GI ROS, Neg liver ROS,   Endo/Other  negative endocrine ROSdiabetes, Type 2Hypothyroidism   Renal/GU negative Renal ROS  negative genitourinary   Musculoskeletal   Abdominal   Peds  Hematology negative hematology ROS (+)   Anesthesia Other Findings   Reproductive/Obstetrics negative OB ROS                             Anesthesia Physical Anesthesia Plan  ASA: II  Anesthesia Plan: General   Post-op Pain Management:    Induction:   PONV Risk Score and Plan:   Airway Management Planned:   Additional Equipment:   Intra-op Plan:   Post-operative Plan:   Informed Consent: I have reviewed the patients History and Physical, chart, labs and discussed the procedure including the risks, benefits and alternatives for the proposed anesthesia with the patient or authorized representative who has indicated his/her understanding and acceptance.   Dental Advisory Given  Plan Discussed with: CRNA and Anesthesiologist  Anesthesia Plan Comments:         Anesthesia Quick Evaluation

## 2017-12-17 ENCOUNTER — Encounter (HOSPITAL_COMMUNITY): Payer: Self-pay | Admitting: Ophthalmology

## 2017-12-26 ENCOUNTER — Encounter (HOSPITAL_COMMUNITY)
Admission: RE | Admit: 2017-12-26 | Discharge: 2017-12-26 | Disposition: A | Discharge status: Home / Self Care | Payer: Medicare Other | Source: Ambulatory Visit | Attending: Ophthalmology | Admitting: Ophthalmology

## 2017-12-26 ENCOUNTER — Encounter (HOSPITAL_COMMUNITY): Payer: Self-pay

## 2017-12-30 ENCOUNTER — Encounter (HOSPITAL_COMMUNITY)
Admission: RE | Disposition: A | Discharge status: Home / Self Care | Payer: Self-pay | Source: Ambulatory Visit | Attending: Ophthalmology

## 2017-12-30 ENCOUNTER — Ambulatory Visit (HOSPITAL_COMMUNITY)
Admission: RE | Admit: 2017-12-30 | Discharge: 2017-12-30 | Disposition: A | Discharge status: Home / Self Care | Payer: Medicare Other | Source: Ambulatory Visit | Attending: Ophthalmology | Admitting: Ophthalmology

## 2017-12-30 ENCOUNTER — Encounter (HOSPITAL_COMMUNITY): Payer: Self-pay | Admitting: Anesthesiology

## 2017-12-30 ENCOUNTER — Ambulatory Visit (HOSPITAL_COMMUNITY): Payer: Medicare Other | Admitting: Anesthesiology

## 2017-12-30 DIAGNOSIS — H25811 Combined forms of age-related cataract, right eye: Secondary | ICD-10-CM | POA: Diagnosis not present

## 2017-12-30 DIAGNOSIS — Z79899 Other long term (current) drug therapy: Secondary | ICD-10-CM | POA: Insufficient documentation

## 2017-12-30 DIAGNOSIS — I10 Essential (primary) hypertension: Secondary | ICD-10-CM | POA: Insufficient documentation

## 2017-12-30 DIAGNOSIS — E039 Hypothyroidism, unspecified: Secondary | ICD-10-CM | POA: Insufficient documentation

## 2017-12-30 DIAGNOSIS — E1136 Type 2 diabetes mellitus with diabetic cataract: Secondary | ICD-10-CM | POA: Insufficient documentation

## 2017-12-30 HISTORY — PX: CATARACT EXTRACTION W/PHACO: SHX586

## 2017-12-30 LAB — GLUCOSE, CAPILLARY: Glucose-Capillary: 198 mg/dL — ABNORMAL HIGH (ref 70–99)

## 2017-12-30 SURGERY — PHACOEMULSIFICATION, CATARACT, WITH IOL INSERTION
Anesthesia: Monitor Anesthesia Care | Site: Eye | Laterality: Right

## 2017-12-30 MED ORDER — LIDOCAINE HCL (PF) 1 % IJ SOLN
INTRAMUSCULAR | Status: DC | PRN
  Administered 2017-12-30: .8 mL

## 2017-12-30 MED ORDER — LIDOCAINE HCL 3.5 % OP GEL
1.0000 "application " | Freq: Once | OPHTHALMIC | Status: AC
  Administered 2017-12-30: 1 via OPHTHALMIC

## 2017-12-30 MED ORDER — LACTATED RINGERS IV SOLN
INTRAVENOUS | Status: DC
  Administered 2017-12-30: 10:00:00 via INTRAVENOUS

## 2017-12-30 MED ORDER — HYDROMORPHONE HCL 1 MG/ML IJ SOLN: 0.2500 mg | INTRAMUSCULAR | Status: DC | PRN

## 2017-12-30 MED ORDER — HYDROCODONE-ACETAMINOPHEN 7.5-325 MG PO TABS: 1.0000 | ORAL_TABLET | Freq: Once | ORAL | Status: DC | PRN

## 2017-12-30 MED ORDER — POVIDONE-IODINE 5 % OP SOLN
OPHTHALMIC | Status: DC | PRN
  Administered 2017-12-30: 1 via OPHTHALMIC

## 2017-12-30 MED ORDER — PROMETHAZINE HCL 25 MG/ML IJ SOLN: 6.2500 mg | INTRAMUSCULAR | Status: DC | PRN

## 2017-12-30 MED ORDER — EPINEPHRINE PF 1 MG/ML IJ SOLN
INTRAOCULAR | Status: DC | PRN
  Administered 2017-12-30: 500 mL

## 2017-12-30 MED ORDER — NEOMYCIN-POLYMYXIN-DEXAMETH 3.5-10000-0.1 OP SUSP
OPHTHALMIC | Status: DC | PRN
  Administered 2017-12-30: 2 [drp] via OPHTHALMIC

## 2017-12-30 MED ORDER — TETRACAINE HCL 0.5 % OP SOLN
1.0000 [drp] | OPHTHALMIC | Status: AC
  Administered 2017-12-30 (×3): 1 [drp] via OPHTHALMIC

## 2017-12-30 MED ORDER — CYCLOPENTOLATE-PHENYLEPHRINE 0.2-1 % OP SOLN
1.0000 [drp] | OPHTHALMIC | Status: AC
  Administered 2017-12-30 (×3): 1 [drp] via OPHTHALMIC

## 2017-12-30 MED ORDER — PROVISC 10 MG/ML IO SOLN
INTRAOCULAR | Status: DC | PRN
  Administered 2017-12-30: 0.85 mL via INTRAOCULAR

## 2017-12-30 MED ORDER — PHENYLEPHRINE HCL 2.5 % OP SOLN
1.0000 [drp] | OPHTHALMIC | Status: AC
  Administered 2017-12-30 (×3): 1 [drp] via OPHTHALMIC

## 2017-12-30 MED ORDER — BSS IO SOLN
INTRAOCULAR | Status: DC | PRN
  Administered 2017-12-30: 15 mL

## 2017-12-30 MED ORDER — MEPERIDINE HCL 50 MG/ML IJ SOLN: 6.2500 mg | INTRAMUSCULAR | Status: DC | PRN

## 2017-12-30 MED ORDER — MIDAZOLAM HCL 5 MG/5ML IJ SOLN
INTRAMUSCULAR | Status: DC | PRN
  Administered 2017-12-30: 2 mg via INTRAVENOUS

## 2017-12-30 SURGICAL SUPPLY — 13 items
CLOTH BEACON ORANGE TIMEOUT ST (SAFETY) ×2 IMPLANT
EYE SHIELD UNIVERSAL CLEAR (GAUZE/BANDAGES/DRESSINGS) ×2 IMPLANT
GLOVE BIOGEL PI IND STRL 6.5 (GLOVE) IMPLANT
GLOVE BIOGEL PI IND STRL 7.0 (GLOVE) IMPLANT
GLOVE BIOGEL PI INDICATOR 6.5 (GLOVE) ×2
GLOVE BIOGEL PI INDICATOR 7.0 (GLOVE) ×2
LENS ALC ACRYL/TECN (Ophthalmic Related) ×2 IMPLANT
PAD ARMBOARD 7.5X6 YLW CONV (MISCELLANEOUS) ×2 IMPLANT
SYRINGE LUER LOK 1CC (MISCELLANEOUS) ×2 IMPLANT
TAPE SURG TRANSPORE 1 IN (GAUZE/BANDAGES/DRESSINGS) IMPLANT
TAPE SURGICAL TRANSPORE 1 IN (GAUZE/BANDAGES/DRESSINGS) ×2
VISCOELASTIC ADDITIONAL (OPHTHALMIC RELATED) ×2 IMPLANT
WATER STERILE IRR 250ML POUR (IV SOLUTION) ×2 IMPLANT

## 2017-12-30 NOTE — H&P (Signed)
I have reviewed the H&P, the patient was re-examined, and I have identified no interval changes in medical condition and plan of care since the history and physical of record  

## 2017-12-30 NOTE — Anesthesia Preprocedure Evaluation (Signed)
Anesthesia Evaluation  Patient identified by MRN, date of birth, ID band Patient awake    Reviewed: Allergy & Precautions, H&P , NPO status , Patient's Chart, lab work & pertinent test results, reviewed documented beta blocker date and time   Airway Mallampati: II  TM Distance: >3 FB Neck ROM: full    Dental no notable dental hx. (+) Teeth Intact   Pulmonary neg pulmonary ROS,    Pulmonary exam normal breath sounds clear to auscultation       Cardiovascular Exercise Tolerance: Good hypertension, On Medications negative cardio ROS   Rhythm:regular Rate:Normal     Neuro/Psych  Headaches, negative neurological ROS  negative psych ROS   GI/Hepatic negative GI ROS, Neg liver ROS,   Endo/Other  negative endocrine ROSdiabetes, Type 2Hypothyroidism   Renal/GU      Musculoskeletal   Abdominal   Peds  Hematology negative hematology ROS (+)   Anesthesia Other Findings   Reproductive/Obstetrics negative OB ROS                             Anesthesia Physical Anesthesia Plan  ASA: III  Anesthesia Plan: MAC   Post-op Pain Management:    Induction:   PONV Risk Score and Plan:   Airway Management Planned:   Additional Equipment:   Intra-op Plan:   Post-operative Plan:   Informed Consent:   Plan Discussed with:   Anesthesia Plan Comments:         Anesthesia Quick Evaluation

## 2017-12-30 NOTE — Op Note (Signed)
Date of Admission: 12/30/2017  Date of Surgery: 12/30/2017  Pre-Op Dx: Cataract Right  Eye  Post-Op Dx: Senile Combined Cataract  Right  Eye,  Dx Code O11.572  Surgeon: [name], M.D.  Assistants: None  Anesthesia: Topical with MAC  Indications: Painless, progressive loss of vision with compromise of daily activities.  Surgery: Cataract Extraction with Intraocular lens Implant Right Eye  Discription: The patient had dilating drops and viscous lidocaine placed into the Right eye in the pre-op holding area. After transfer to the operating room, a time out was performed. The patient was then prepped and draped. Beginning with a 15m blade a paracentesis port was made at the surgeon's 2 o'clock position. The anterior chamber was then filled with 1% non-preserved lidocaine. This was followed by filling the anterior chamber with Provisc.  A 2.48mkeratome blade was used to make a clear corneal incision at the temporal limbus.  A bent cystatome needle was used to create a continuous tear capsulotomy. Hydrodissection was performed with balanced salt solution on a Fine canula. The lens nucleus was then removed using the phacoemulsification handpiece. Residual cortex was removed with the I&A handpiece. The anterior chamber and capsular bag were refilled with Provisc. A posterior chamber intraocular lens was placed into the capsular bag with it's injector. The implant was positioned with the Kuglan hook. The Provisc was then removed from the anterior chamber and capsular bag with the I&A handpiece. Stromal hydration of the main incision and paracentesis port was performed with BSS on a Fine canula. The wounds were tested for leak which was negative. The patient tolerated the procedure well. There were no operative complications. The patient was then transferred to the recovery room in stable condition.  Complications: None  Specimen: None  EBL: None  Prosthetic device: J&J Technis, PCB00, power 22.0 SN  [id number]

## 2017-12-30 NOTE — Anesthesia Postprocedure Evaluation (Signed)
Anesthesia Post Note  Patient: Megan Schroeder  Procedure(s) Performed: CATARACT EXTRACTION PHACO AND INTRAOCULAR LENS PLACEMENT RIGHT EYE (Right Eye)  Patient location during evaluation: Short Stay Anesthesia Type: MAC Level of consciousness: awake and alert and oriented Pain management: pain level controlled Vital Signs Assessment: post-procedure vital signs reviewed and stable Cardiovascular status: stable Postop Assessment: no apparent nausea or vomiting Anesthetic complications: no     Last Vitals:  Vitals:   12/30/17 0947 12/30/17 1044  BP: 135/69   Pulse:  91  Resp: 14   Temp: 36.7 C 36.9 C  SpO2: 98% 97%    Last Pain:  Vitals:   12/30/17 1044  TempSrc: Oral  PainSc: 0-No pain                 ADAMS, AMY A

## 2017-12-30 NOTE — Transfer of Care (Signed)
Immediate Anesthesia Transfer of Care Note  Patient: Megan Schroeder  Procedure(s) Performed: CATARACT EXTRACTION PHACO AND INTRAOCULAR LENS PLACEMENT RIGHT EYE (Right Eye)  Patient Location: Short Stay  Anesthesia Type:MAC  Level of Consciousness: awake, alert , oriented and patient cooperative  Airway & Oxygen Therapy: Patient Spontanous Breathing  Post-op Assessment: Report given to RN and Post -op Vital signs reviewed and stable  Post vital signs: Reviewed and stable  Last Vitals:  Vitals Value Taken Time  BP    Temp    Pulse    Resp    SpO2      Last Pain:  Vitals:   12/30/17 0947  TempSrc: Oral  PainSc: 0-No pain         Complications: No apparent anesthesia complications

## 2017-12-31 ENCOUNTER — Encounter (HOSPITAL_COMMUNITY): Payer: Self-pay | Admitting: Ophthalmology

## 2018-08-10 ENCOUNTER — Emergency Department (HOSPITAL_COMMUNITY): Payer: Medicare Other

## 2018-08-10 ENCOUNTER — Emergency Department (HOSPITAL_COMMUNITY)
Admission: EM | Admit: 2018-08-10 | Discharge: 2018-08-11 | Disposition: A | Discharge status: Home / Self Care | Payer: Medicare Other | Attending: Emergency Medicine | Admitting: Emergency Medicine

## 2018-08-10 ENCOUNTER — Encounter (HOSPITAL_COMMUNITY): Payer: Self-pay | Admitting: *Deleted

## 2018-08-10 ENCOUNTER — Other Ambulatory Visit: Payer: Self-pay

## 2018-08-10 DIAGNOSIS — E119 Type 2 diabetes mellitus without complications: Secondary | ICD-10-CM | POA: Insufficient documentation

## 2018-08-10 DIAGNOSIS — R1031 Right lower quadrant pain: Secondary | ICD-10-CM | POA: Insufficient documentation

## 2018-08-10 DIAGNOSIS — R1011 Right upper quadrant pain: Secondary | ICD-10-CM | POA: Diagnosis not present

## 2018-08-10 DIAGNOSIS — I10 Essential (primary) hypertension: Secondary | ICD-10-CM | POA: Diagnosis not present

## 2018-08-10 DIAGNOSIS — Z7982 Long term (current) use of aspirin: Secondary | ICD-10-CM | POA: Diagnosis not present

## 2018-08-10 DIAGNOSIS — Z79899 Other long term (current) drug therapy: Secondary | ICD-10-CM | POA: Insufficient documentation

## 2018-08-10 DIAGNOSIS — E039 Hypothyroidism, unspecified: Secondary | ICD-10-CM | POA: Diagnosis not present

## 2018-08-10 DIAGNOSIS — Z794 Long term (current) use of insulin: Secondary | ICD-10-CM | POA: Diagnosis not present

## 2018-08-10 LAB — CBC WITH DIFFERENTIAL/PLATELET
Abs Immature Granulocytes: 0.05 10*3/uL (ref 0.00–0.07)
BASOS ABS: 0 10*3/uL (ref 0.0–0.1)
BASOS PCT: 0 %
Eosinophils Absolute: 0.2 10*3/uL (ref 0.0–0.5)
Eosinophils Relative: 2 %
HCT: 40.1 % (ref 36.0–46.0)
Hemoglobin: 12.9 g/dL (ref 12.0–15.0)
IMMATURE GRANULOCYTES: 1 %
Lymphocytes Relative: 23 %
Lymphs Abs: 2.4 10*3/uL (ref 0.7–4.0)
MCH: 27.7 pg (ref 26.0–34.0)
MCHC: 32.2 g/dL (ref 30.0–36.0)
MCV: 86.1 fL (ref 80.0–100.0)
Monocytes Absolute: 0.6 10*3/uL (ref 0.1–1.0)
Monocytes Relative: 6 %
NEUTROS ABS: 6.9 10*3/uL (ref 1.7–7.7)
NEUTROS PCT: 68 %
NRBC: 0 % (ref 0.0–0.2)
PLATELETS: 174 10*3/uL (ref 150–400)
RBC: 4.66 MIL/uL (ref 3.87–5.11)
RDW: 13.7 % (ref 11.5–15.5)
WBC: 10.1 10*3/uL (ref 4.0–10.5)

## 2018-08-10 LAB — COMPREHENSIVE METABOLIC PANEL
ALT: 23 U/L (ref 0–44)
ANION GAP: 13 (ref 5–15)
AST: 25 U/L (ref 15–41)
Albumin: 4.6 g/dL (ref 3.5–5.0)
Alkaline Phosphatase: 63 U/L (ref 38–126)
BUN: 20 mg/dL (ref 8–23)
CO2: 23 mmol/L (ref 22–32)
Calcium: 9.7 mg/dL (ref 8.9–10.3)
Chloride: 103 mmol/L (ref 98–111)
Creatinine, Ser: 0.83 mg/dL (ref 0.44–1.00)
Glucose, Bld: 236 mg/dL — ABNORMAL HIGH (ref 70–99)
POTASSIUM: 3.6 mmol/L (ref 3.5–5.1)
Sodium: 139 mmol/L (ref 135–145)
TOTAL PROTEIN: 8.4 g/dL — AB (ref 6.5–8.1)
Total Bilirubin: 0.4 mg/dL (ref 0.3–1.2)

## 2018-08-10 LAB — LACTIC ACID, PLASMA: Lactic Acid, Venous: 1.6 mmol/L (ref 0.5–1.9)

## 2018-08-10 LAB — LIPASE, BLOOD: LIPASE: 38 U/L (ref 11–51)

## 2018-08-10 MED ORDER — MORPHINE SULFATE (PF) 2 MG/ML IV SOLN
2.0000 mg | Freq: Once | INTRAVENOUS | Status: AC
  Administered 2018-08-10: 2 mg via INTRAVENOUS
  Filled 2018-08-10: qty 1

## 2018-08-10 MED ORDER — ONDANSETRON HCL 4 MG/2ML IJ SOLN
4.0000 mg | Freq: Once | INTRAMUSCULAR | Status: AC
  Administered 2018-08-10: 4 mg via INTRAVENOUS
  Filled 2018-08-10: qty 2

## 2018-08-10 MED ORDER — SODIUM CHLORIDE 0.9% FLUSH: 3.0000 mL | Freq: Once | INTRAVENOUS | Status: DC

## 2018-08-10 NOTE — ED Triage Notes (Signed)
Pt c/o right abdominal pain; pt was seen at her PCP x 5 days ago and was put on antibiotics and told she could have diverticulitis; pt is scheduled for an [place] 08/18/2018; pt denies any n/v/d and states her last BM was this am and it was normal

## 2018-08-11 ENCOUNTER — Ambulatory Visit (HOSPITAL_COMMUNITY)
Admission: RE | Admit: 2018-08-11 | Discharge: 2018-08-11 | Disposition: A | Discharge status: Home / Self Care | Payer: Medicare Other | Source: Ambulatory Visit | Attending: Emergency Medicine | Admitting: Emergency Medicine

## 2018-08-11 MED ORDER — IOHEXOL 300 MG/ML  SOLN
100.0000 mL | Freq: Once | INTRAMUSCULAR | Status: AC | PRN
  Administered 2018-08-11: 100 mL via INTRAVENOUS

## 2018-08-11 MED ORDER — OXYCODONE-ACETAMINOPHEN 5-325 MG PO TABS: 1.0000 | ORAL_TABLET | ORAL | 0 refills | Status: DC | PRN

## 2018-08-11 MED ORDER — MORPHINE SULFATE (PF) 2 MG/ML IV SOLN
2.0000 mg | Freq: Once | INTRAVENOUS | Status: AC
  Administered 2018-08-11: 2 mg via INTRAVENOUS
  Filled 2018-08-11: qty 1

## 2018-08-11 MED ORDER — ONDANSETRON 4 MG PO TBDP: 4.0000 mg | ORAL_TABLET | Freq: Three times a day (TID) | ORAL | 0 refills | Status: DC | PRN

## 2018-08-11 MED ORDER — TRAMADOL HCL 50 MG PO TABS: 50.0000 mg | ORAL_TABLET | Freq: Four times a day (QID) | ORAL | 0 refills | Status: DC | PRN

## 2018-08-11 NOTE — Discharge Instructions (Addendum)
Your lab tests and your CT scan do not show [place] the source of your pain.  An ultrasound has been ordered for you for tomorrow. See the instructions for getting this scheduled.  You may take the tramadol prescribed for pain relief.  This will make you drowsy - do not drive within 4 hours of taking this medication.

## 2018-08-11 NOTE — ED Provider Notes (Signed)
1020 patient returned this morning for outpatient scheduled ultrasound of the abdomen.  She was seen here last evening for right upper quadrant pain.  CT last evening negative for gallbladder disease.    US Abdomen Limited Ruq/gall Gladder  Result Date: 08/11/2018 CLINICAL DATA:  Right upper quadrant pain EXAM: ULTRASOUND ABDOMEN LIMITED RIGHT UPPER QUADRANT COMPARISON:  CT 08/11/2018 FINDINGS: Gallbladder: No gallstones or wall thickening visualized. No sonographic Murphy sign noted by sonographer. Common bile duct: Diameter: Normal caliber, 3 mm Liver: Increased echotexture compatible with fatty infiltration. No focal abnormality or biliary ductal dilatation. Portal vein is patent on color Doppler imaging with normal direction of blood flow towards the liver. IMPRESSION: Fatty infiltration of the liver.  No acute findings. Electronically Signed   By: Rolm Baptise M.D.   On: 08/11/2018 10:16    US findings discussed.  She agrees to close out pt f/u with PCP.   Kem Parkinson, PA-C 08/11/18 1035    Elnora Morrison, MD 08/12/18 [phone]

## 2018-08-11 NOTE — ED Provider Notes (Signed)
[facility] EMERGENCY DEPARTMENT Provider Note   CSN: [id number] Arrival date & time: 08/10/18  2201    History   Chief Complaint Chief Complaint  Patient presents with  . Abdominal Pain    HPI Megan Schroeder is a 73 y.o. female with a history of well-controlled diabetes patient stating her last CBG check today was 140, hypertension, hyperlipidemia and thyroid disease, also with history of diverticulosis presenting with a 5-day history of right lower abdominal pain.  She was seen by her PCP 3 days ago who felt she may have an early diverticulitis and was placed on an antibiotic which may be Levaquin, but patient is not 967% certain of this name.  Her pain is sharp, stabbing and intermittent, worsened with movement and better at rest.  She denies shortness of breath, chest pain, has no nausea, vomiting or diarrhea.  Her last bowel movement was this morning which did not affect this pain.  Her pain has become more severe tonight.  She has had no medications prior to arrival for treatment of her pain.     The history is provided by the patient and the spouse.    Past Medical History:  Diagnosis Date  . Diabetes mellitus   . Hyperlipidemia   . Hypertension   . Thyroid disease     Patient Active Problem List   Diagnosis Date Noted  . Facial cellulitis 12/24/2015  . Hypothyroidism 12/24/2015  . History of colonic polyps   . Diverticulosis of colon without hemorrhage   . Headache(784.0) 07/22/2011  . Chest pain 07/21/2011  . Hypertension 07/21/2011  . Diabetes mellitus (Levasy) 07/21/2011  . Hyperlipidemia 07/21/2011    Past Surgical History:  Procedure Laterality Date  . BREAST EXCISIONAL BIOPSY Left   . CATARACT EXTRACTION W/PHACO Left 12/16/2017   Procedure: CATARACT EXTRACTION PHACO AND INTRAOCULAR LENS PLACEMENT (IOC);  Surgeon: [name], MD;  Location: [facility] ORS;  Service: Ophthalmology;  Laterality: Left;  CDE: 11.65  . CATARACT EXTRACTION W/PHACO Right 12/30/2017   Procedure: CATARACT EXTRACTION PHACO AND INTRAOCULAR LENS PLACEMENT RIGHT EYE;  Surgeon: [name], MD;  Location: [facility];  Service: Ophthalmology;  Laterality: Right;  CDE: 9.07  . COLONOSCOPY N/A 09/15/2015   Procedure: COLONOSCOPY;  Surgeon: [name], MD;  Location: [facility] ENDO SUITE;  Service: Endoscopy;  Laterality: N/A;  9:45 AM - moved to 10:00 - office to notify  . LEFT HEART CATHETERIZATION WITH CORONARY ANGIOGRAM N/A 07/23/2011   Procedure: LEFT HEART CATHETERIZATION WITH CORONARY ANGIOGRAM;  Surgeon: [name], MD;  Location: [facility] CATH LAB;  Service: Cardiovascular;  Laterality: N/A;     OB History    Gravida  1   Para  1   Term  1   Preterm      AB      Living  1     SAB      TAB      Ectopic      Multiple      Live Births               Home Medications    Prior to Admission medications   Medication Sig Start Date End Date Taking? Authorizing Provider  acetaminophen (TYLENOL) 500 MG tablet Take 500 mg by mouth daily as needed for moderate pain or headache.    [provider]  amLODipine (NORVASC) 10 MG tablet Take 10 mg by mouth every evening.     [provider]  aspirin EC 81 MG  tablet Take 81 mg by mouth every evening.     [provider]  atorvastatin (LIPITOR) 20 MG tablet Take 20 mg by mouth every evening.     [provider]  cholecalciferol (VITAMIN D) 1000 UNITS tablet Take 1,000 Units by mouth every morning.    [provider]  Coenzyme Q10 (COQ-10) 200 MG CAPS Take 200 mg by mouth daily.     [provider]  dapagliflozin propanediol (FARXIGA) 10 MG TABS tablet Take 10 mg by mouth daily.    [provider]  Insulin Glargine (TOUJEO SOLOSTAR) 300 UNIT/ML SOPN Inject 50 Units into the skin at bedtime.    [provider]  levothyroxine (SYNTHROID, LEVOTHROID) 75 MCG tablet Take 75 mcg by mouth every morning.     [provider]  Liraglutide (VICTOZA) 18 MG/3ML  SOPN Inject 0.6 mg into the skin every morning.     [provider]  metFORMIN (GLUCOPHAGE) 500 MG tablet Take 1 tablet (500 mg total) by mouth 2 (two) times daily with a meal. 07/24/11   Hongalgi, Lenis Dickinson, MD  NONFORMULARY OR COMPOUNDED ITEM Place 1 drop into the left eye 3 (three) times daily. prednisolone-bromfenac    [provider]  olmesartan-hydrochlorothiazide (BENICAR HCT) 40-12.5 MG per tablet Take 1 tablet by mouth daily.     [provider]  Omega-3 Fatty Acids (FISH OIL) 1200 MG CAPS Take 1,200 mg by mouth daily.    [provider]  ondansetron (ZOFRAN ODT) 4 MG disintegrating tablet Take 1 tablet (4 mg total) by mouth every 8 (eight) hours as needed for nausea or vomiting. 08/11/18   Evalee Jefferson, PA-C  oxyCODONE-acetaminophen (PERCOCET/ROXICET) 5-325 MG tablet Take 1 tablet by mouth every 4 (four) hours as needed for severe pain. 08/11/18   Evalee Jefferson, PA-C  oxyCODONE-acetaminophen (PERCOCET/ROXICET) 5-325 MG tablet Take 1 tablet by mouth every 4 (four) hours as needed. 08/11/18   Evalee Jefferson, PA-C  prednisoLONE acetate (PRED FORTE) 1 % ophthalmic suspension 1 drop 4 (four) times daily.    [provider]    Family History Family History  Problem Relation Age of Onset  . Cancer Father   . Diabetes Father     Social History Social History   Tobacco Use  . Smoking status: Never Smoker  . Smokeless tobacco: Never Used  Substance Use Topics  . Alcohol use: No  . Drug use: No     Allergies   Penicillins and Hydrocodone   Review of Systems Review of Systems  Constitutional: Negative for chills and fever.  HENT: Negative for congestion and sore throat.   Eyes: Negative.   Respiratory: Negative for chest tightness and shortness of breath.   Cardiovascular: Negative for chest pain.  Gastrointestinal: Positive for abdominal pain. Negative for constipation, diarrhea, nausea and vomiting.  Genitourinary: Negative.  Negative for  dysuria and hematuria.  Musculoskeletal: Negative for arthralgias, joint swelling and neck pain.  Skin: Negative.  Negative for rash and wound.  Neurological: Negative for dizziness, weakness, light-headedness, numbness and headaches.  Psychiatric/Behavioral: Negative.      Physical Exam Updated Vital Signs BP (!) 133/57   Pulse 89   Temp 98.1 F (36.7 C) (Oral)   Resp 18   Ht 5\' 3"  (1.6 m)   Wt 68 kg   SpO2 95%   BMI 26.57 kg/m   Physical Exam Vitals signs and nursing note reviewed.  Constitutional:      Appearance: She is well-developed.  HENT:  Head: Normocephalic and atraumatic.  Eyes:     Conjunctiva/sclera: Conjunctivae normal.  Neck:     Musculoskeletal: Normal range of motion.  Cardiovascular:     Rate and Rhythm: Normal rate and regular rhythm.     Heart sounds: Normal heart sounds.  Pulmonary:     Effort: Pulmonary effort is normal.     Breath sounds: Normal breath sounds. No wheezing.  Abdominal:     General: Abdomen is protuberant. Bowel sounds are normal.     Palpations: Abdomen is soft. There is no mass.     Tenderness: There is abdominal tenderness in the right upper quadrant and right lower quadrant. Negative signs include Murphy's sign, Rovsing's sign and McBurney's sign.     Hernia: No hernia is present.  Musculoskeletal: Normal range of motion.  Skin:    General: Skin is warm and dry.  Neurological:     Mental Status: She is alert.      ED Treatments / Results  Labs (all labs ordered are listed, but only abnormal results are displayed) Labs Reviewed  COMPREHENSIVE METABOLIC PANEL - Abnormal; Notable for the following components:      Result Value   Glucose, Bld 236 (*)    Total Protein 8.4 (*)    All other components within normal limits  LIPASE, BLOOD  CBC WITH DIFFERENTIAL/PLATELET  LACTIC ACID, PLASMA  URINALYSIS, ROUTINE W REFLEX MICROSCOPIC    EKG None  Radiology Ct Abdomen Pelvis W Contrast  Result Date:  08/11/2018 CLINICAL DATA:  Right-sided abdominal pain. EXAM: CT ABDOMEN AND PELVIS WITH CONTRAST TECHNIQUE: Multidetector CT imaging of the abdomen and pelvis was performed using the standard protocol following bolus administration of intravenous contrast. CONTRAST:  16mL OMNIPAQUE IOHEXOL 300 MG/ML  SOLN COMPARISON:  None. FINDINGS: Lower chest: The lung bases are clear. There are coronary artery calcifications. Hepatobiliary: Decreased hepatic density consistent with steatosis. No focal hepatic abnormality. Gallbladder physiologically distended, no calcified stone. No biliary dilatation. Pancreas: No ductal dilatation or inflammation. Spleen: Normal in size without focal abnormality. Adrenals/Urinary Tract: Normal adrenal glands. No hydronephrosis or perinephric edema. Homogeneous renal enhancement with symmetric excretion on delayed phase imaging. Urinary bladder is physiologically distended without wall thickening. Stomach/Bowel: Stomach physiologically distended. Fecalization of small bowel contents without obstruction or wall thickening. No small bowel inflammation. Normal appendix. Colonic diverticulosis, prominent in the distal descending and sigmoid colon. Mild hands verse colonic tortuosity. Normal terminal ileum. Vascular/Lymphatic: Aortic atherosclerosis without aneurysm. Retroaortic left renal vein. Portal vein and mesenteric vessels are patent. Multiple small central mesenteric nodes are likely reactive. Prominent porta hepatis nodes. No enlarged lymph nodes in the abdomen or pelvis. Reproductive: Uterus and bilateral adnexa are unremarkable. Other: No free air, free fluid, or intra-abdominal fluid collection. Soft tissue densities in the anterior abdominal wall likely medications injection sites. Musculoskeletal: There are no acute or suspicious osseous abnormalities. IMPRESSION: 1. No acute abnormality in the abdomen/pelvis. 2. Colonic diverticulosis, prominent in the sigmoid colon. No  diverticulitis. 3. Hepatic steatosis. 4.  Aortic Atherosclerosis (ICD10-I70.0). Electronically Signed   By: Keith Rake M.D.   On: 08/11/2018 00:48    Procedures Procedures (including critical care time)  Medications Ordered in ED Medications  ondansetron (ZOFRAN) injection 4 mg (4 mg Intravenous Given 08/10/18 2309)  morphine 2 MG/ML injection 2 mg (2 mg Intravenous Given 08/10/18 2309)  iohexol (OMNIPAQUE) 300 MG/ML solution 100 mL (100 mLs Intravenous Contrast Given 08/11/18 0014)     Initial Impression / Assessment and Plan / ED Course  I have reviewed the triage vital signs and the nursing notes.  Pertinent labs & imaging results that were available during my care of the patient were reviewed by me and considered in my medical decision making (see chart for details).        Patient with a now 5-day history of right-sided abdominal pain, right lower quadrant worse than upper quadrant per my exam.  LFTs are normal, lipase normal as are other labs.  She has no guarding.  Her CT scan is negative for acute diverticulitis, appendicitis or any other obvious source of her symptoms.  Patient states she is scheduled for an ultrasound 1 week from today, ordered by her PCP to evaluate this pain.  She was scheduled for an outpatient ultrasound tomorrow instead to evaluate for possible gallbladder disease.  She was prescribed pain medicine and nausea medicine.  Further management to be determined if her ultrasound is positive for gallbladder disease.  Otherwise she is encouraged to continue taking the Levaquin as prescribed by her primary doctor plan follow-up with him this week if symptoms persist.  Final Clinical Impressions(s) / ED Diagnoses   Final diagnoses:  Right lower quadrant abdominal pain  Right upper quadrant abdominal pain    ED Discharge Orders         Ordered    oxyCODONE-acetaminophen (PERCOCET/ROXICET) 5-325 MG tablet  Every 4 hours PRN     08/11/18 0121    ondansetron  (ZOFRAN ODT) 4 MG disintegrating tablet  Every 8 hours PRN     08/11/18 0121    oxyCODONE-acetaminophen (PERCOCET/ROXICET) 5-325 MG tablet  Every 4 hours PRN     08/11/18 0122    US Abdomen Limited RUQ/Gall Gladder     08/11/18 0123           Evalee Jefferson, PA-C 81/18/86 [id number]    Delora Fuel, MD 36/68/15 [phone]

## 2018-08-28 ENCOUNTER — Encounter: Payer: Self-pay | Admitting: Internal Medicine

## 2019-03-12 ENCOUNTER — Ambulatory Visit (INDEPENDENT_AMBULATORY_CARE_PROVIDER_SITE_OTHER): Payer: Self-pay | Admitting: *Deleted

## 2019-03-12 ENCOUNTER — Other Ambulatory Visit: Payer: Self-pay

## 2019-03-12 DIAGNOSIS — Z86010 Personal history of colonic polyps: Secondary | ICD-10-CM

## 2019-03-12 MED ORDER — PEG 3350-KCL-NA BICARB-NACL 420 G PO SOLR: 4000.0000 mL | Freq: Once | ORAL | 0 refills | Status: AC

## 2019-03-12 NOTE — Progress Notes (Signed)
Gastroenterology Pre-Procedure Review  Request Date: 03/12/2019 Requesting Physician: 3 year recall, Last TCS 09/15/2015 done by Dr. Gala Romney, multiple polyps, tubular adenoma, sessile serrated polyp, hyperplastic polyp  PATIENT REVIEW QUESTIONS: The patient responded to the following health history questions as indicated:    1. Diabetes Melitis: yes 2. Joint replacements in the past 12 months: no 3. Major health problems in the past 3 months: no 4. Has an artificial valve or MVP: no 5. Has a defibrillator: no 6. Has been advised in past to take antibiotics in advance of a procedure like teeth cleaning: no 7. Family history of colon cancer: no 8. Alcohol Use: no 9. Illicit drug Use: no 10. History of sleep apnea: no  11. History of coronary artery or other vascular stents placed within the last 12 months: no 12. History of any prior anesthesia complications: no 13. There is no height or weight on file to calculate BMI. ht: 5'3 wt: 145 lbs    MEDICATIONS & ALLERGIES:    Patient reports the following regarding taking any blood thinners:   Plavix? no Aspirin? yes Coumadin? no Brilinta? no Xarelto? no Eliquis? no Pradaxa? no Savaysa? no Effient? no  Patient confirms/reports the following medications:  Current Outpatient Medications  Medication Sig Dispense Refill  . amLODipine (NORVASC) 10 MG tablet Take 10 mg by mouth every evening.     Marland Kitchen aspirin EC 81 MG tablet Take 81 mg by mouth every evening.     Marland Kitchen atorvastatin (LIPITOR) 20 MG tablet Take 20 mg by mouth every evening.     . cholecalciferol (VITAMIN D) 1000 UNITS tablet Take 1,000 Units by mouth every morning.    . Coenzyme Q10 (COQ-10) 200 MG CAPS Take 200 mg by mouth daily.     . dapagliflozin propanediol (FARXIGA) 10 MG TABS tablet Take 10 mg by mouth daily.    . Insulin Glargine (TOUJEO SOLOSTAR) 300 UNIT/ML SOPN Inject 50 Units into the skin at bedtime.    Marland Kitchen levothyroxine (SYNTHROID, LEVOTHROID) 75 MCG tablet Take 75 mcg by  mouth every morning.     . Liraglutide (VICTOZA) 18 MG/3ML SOPN Inject 0.6 mg into the skin every morning.     . metFORMIN (GLUCOPHAGE) 500 MG tablet Take 1 tablet (500 mg total) by mouth 2 (two) times daily with a meal. (Patient taking differently: Take 500 mg by mouth daily. Takes 1 tablet daily.)    . Omega-3 Fatty Acids (FISH OIL) 1200 MG CAPS Take 1,200 mg by mouth daily.    Marland Kitchen acetaminophen (TYLENOL) 500 MG tablet Take 500 mg by mouth as needed for moderate pain or headache.      No current facility-administered medications for this visit.     Patient confirms/reports the following allergies:  Allergies  Allergen Reactions  . Penicillins Hives    Has patient had a PCN reaction causing immediate rash, facial/tongue/throat swelling, SOB or lightheadedness with hypotension: No Has patient had a PCN reaction causing severe rash involving mucus membranes or skin necrosis: Yes Has patient had a PCN reaction that required hospitalization: No Has patient had a PCN reaction occurring within the last 10 years: No If all of the above answers are "NO", then may proceed with Cephalosporin use.    Marland Kitchen Hydrocodone Nausea And Vomiting    No orders of the defined types were placed in this encounter.   AUTHORIZATION INFORMATION Primary Insurance: [employer] Medicare,  ID #: [id number],  Group #: [id number] Pre-Cert / Auth required: No, not required  SCHEDULE INFORMATION:  Procedure has been scheduled as follows:  Date: 04/22/2019, Time: 7:30 Location: [facility] with Dr. Gala Romney  This Gastroenterology Pre-Precedure Review Form is being routed to the following provider(s): Roseanne Kaufman, NP

## 2019-03-12 NOTE — Patient Instructions (Signed)
Megan Schroeder   10-21-1945 MRN: [id number]    Procedure Date: 04/22/2019 Time to register: 6:30 am Place to register: [facility] Procedure Time: 7:30 am Scheduled provider: Dr. Gala Romney  PREPARATION FOR COLONOSCOPY WITH TRI-LYTE SPLIT PREP  Please notify us immediately if you are diabetic, take iron supplements, or if you are on Coumadin or any other blood thinners.   Please hold the following medications: See letter  You will need to purchase 1 fleet enema and 1 box of Bisacodyl '5mg'$  tablets.   2 DAYS BEFORE PROCEDURE:  DATE: 04/20/2019   DAY: Monday Begin clear liquid diet AFTER your lunch meal. NO SOLID FOODS after this point.  1 DAY BEFORE PROCEDURE:  DATE: 04/21/2019   DAY: Tuesday Continue clear liquids the entire day - NO SOLID FOOD.   Diabetic medications adjustments for today: See letter  At 2:00 pm:  Take 2 Bisacodyl tablets.   At 4:00pm:  Start drinking your solution. Make sure you mix well per instructions on the bottle. Try to drink 1 (one) 8 ounce glass every 10-15 minutes until you have consumed HALF the jug. You should complete by 6:00pm.You must keep the left over solution refrigerated until completed next day.  Continue clear liquids. You must drink plenty of clear liquids to prevent dehyration and kidney failure.     DAY OF PROCEDURE:   DATE: 04/22/2019   DAY: Wednesday If you take medications for your heart, blood pressure or breathing, you may take these medications.  Diabetic medications adjustments for today: See letter  Five hours before your procedure time @ 2:30 am:  Finish remaining amout of bowel prep, drinking 1 (one) 8 ounce glass every 10-15 minutes until complete. You have two hours to consume remaining prep.   Three hours before your procedure time @ 4:30 am:  Nothing by mouth.   At least one hour before going to the hospital:  Give yourself one Fleet enema. You may take your morning medications with sip of water unless we have  instructed otherwise.      Please see below for Dietary Information.  CLEAR LIQUIDS INCLUDE:  Water Jello (NOT red in color)   Ice Popsicles (NOT red in color)   Tea (sugar ok, no milk/cream) Powdered fruit flavored drinks  Coffee (sugar ok, no milk/cream) Gatorade/ Lemonade/ Kool-Aid  (NOT red in color)   Juice: apple, white grape, white cranberry Soft drinks  Clear bullion, consomme, broth (fat free beef/chicken/vegetable)  Carbonated beverages (any kind)  Strained chicken noodle soup Hard Candy   Remember: Clear liquids are liquids that will allow you to see your fingers on the other side of a clear glass. Be sure liquids are NOT red in color, and not cloudy, but CLEAR.  DO NOT EAT OR DRINK ANY OF THE FOLLOWING:  Dairy products of any kind   Cranberry juice Tomato juice / V8 juice   Grapefruit juice Orange juice     Red grape juice  Do not eat any solid foods, including such foods as: cereal, oatmeal, yogurt, fruits, vegetables, creamed soups, eggs, bread, crackers, pureed foods in a blender, etc.   HELPFUL HINTS FOR DRINKING PREP SOLUTION:   Make sure prep is extremely cold. Mix and refrigerate the the morning of the prep. You may also put in the freezer.   You may try mixing some Crystal Light or Country Time Lemonade if you prefer. Mix in small amounts; add more if necessary.  Try drinking through a straw  Rinse  mouth with water or a mouthwash between glasses, to remove after-taste.  Try sipping on a cold beverage /ice/ popsicles between glasses of prep.  Place a piece of sugar-free hard candy in mouth between glasses.  If you become nauseated, try consuming smaller amounts, or stretch out the time between glasses. Stop for 30-60 minutes, then slowly start back drinking.        OTHER INSTRUCTIONS  You will need a responsible adult at least 71 years of age to accompany you and drive you home. This person must remain in the waiting room during your procedure. The  hospital will cancel your procedure if you do not have a responsible adult with you.   1. Wear loose fitting clothing that is easily removed. 2. Leave jewelry and other valuables at home.  3. Remove all body piercing jewelry and leave at home. 4. Total time from sign-in until discharge is approximately 2-3 hours. 5. You should go home directly after your procedure and rest. You can resume normal activities the day after your procedure. 6. The day of your procedure you should not:  Drive  Make legal decisions  Operate machinery  Drink alcohol  Return to work   You may call the office (Dept: [phone]) before 5:00pm, or page the doctor on call [phone]) after 5:00pm, for further instructions, if necessary.   Insurance Information YOU WILL NEED TO CHECK WITH YOUR INSURANCE COMPANY FOR THE BENEFITS OF COVERAGE YOU HAVE FOR THIS PROCEDURE.  UNFORTUNATELY, NOT ALL INSURANCE COMPANIES HAVE BENEFITS TO COVER ALL OR PART OF THESE TYPES OF PROCEDURES.  IT IS YOUR RESPONSIBILITY TO CHECK YOUR BENEFITS, HOWEVER, WE WILL BE GLAD TO ASSIST YOU WITH ANY CODES YOUR INSURANCE COMPANY MAY NEED.    PLEASE NOTE THAT MOST INSURANCE COMPANIES WILL NOT COVER A SCREENING COLONOSCOPY FOR PEOPLE UNDER THE AGE OF 50  IF YOU HAVE BCBS INSURANCE, YOU MAY HAVE BENEFITS FOR A SCREENING COLONOSCOPY BUT IF POLYPS ARE FOUND THE DIAGNOSIS WILL CHANGE AND THEN YOU MAY HAVE A DEDUCTIBLE THAT WILL NEED TO BE MET. SO PLEASE MAKE SURE YOU CHECK YOUR BENEFITS FOR A SCREENING COLONOSCOPY AS WELL AS A DIAGNOSTIC COLONOSCOPY.

## 2019-03-13 NOTE — Progress Notes (Signed)
May be best with Propofol due to amount of sedation needed last time.

## 2019-03-16 ENCOUNTER — Encounter: Payer: Self-pay | Admitting: *Deleted

## 2019-03-16 NOTE — Progress Notes (Signed)
Mailed letter to pt with appointment information and procedure cancellation.   

## 2019-03-16 NOTE — Progress Notes (Signed)
Patient scheduled.

## 2019-04-20 ENCOUNTER — Other Ambulatory Visit (HOSPITAL_COMMUNITY): Payer: Medicare Other

## 2019-04-20 NOTE — Progress Notes (Signed)
Primary Care Physician:  Lucia Gaskins, MD Primary Gastroenterologist:  Dr. Gala Romney   Chief Complaint  Patient presents with  . Consult    TCS last done 3 yrs ago.    HPI:   Megan Schroeder is a 73 y.o. female presenting today due to need for 3-year-surveillance colonoscopy. Last in 2017 with multiple rectal and colonic polyps removed, path with tubular adenomas, sessile serrated polyps. No dysplasia. She was brought in due to need for Propofol, as last colonoscopy required increased sedation.   She denies any abdominal pain, N/V. No GERD. No dysphagia. No unexplained weight loss or lack of appetite. No rectal bleeding. Postprandial BMs. Goes about twice a day.   Past Medical History:  Diagnosis Date  . Diabetes mellitus   . Hyperlipidemia   . Hypertension   . Thyroid disease     Past Surgical History:  Procedure Laterality Date  . BREAST EXCISIONAL BIOPSY Left   . CATARACT EXTRACTION W/PHACO Left 12/16/2017   Procedure: CATARACT EXTRACTION PHACO AND INTRAOCULAR LENS PLACEMENT (IOC);  Surgeon: [name], MD;  Location: [facility];  Service: Ophthalmology;  Laterality: Left;  CDE: 11.65  . CATARACT EXTRACTION W/PHACO Right 12/30/2017   Procedure: CATARACT EXTRACTION PHACO AND INTRAOCULAR LENS PLACEMENT RIGHT EYE;  Surgeon: [name], MD;  Location: [facility];  Service: Ophthalmology;  Laterality: Right;  CDE: 9.07  . COLONOSCOPY N/A 09/15/2015   Dr. Dudley Major multiple rectal and colonic polyps removed. Hepatic flexure with 9 mm polyp, multiple 5-7 mm polyps. Multiple cecal polyps with largest 1.5 cm, one 5 mm polyp in rectum. Path for colonic polyps with sessile serrated polyp without dysplasia, no high grade dysplasia, tubular adenomas, rectal hyperplastic polyp  . LEFT HEART CATHETERIZATION WITH CORONARY ANGIOGRAM N/A 07/23/2011   Procedure: LEFT HEART CATHETERIZATION WITH CORONARY ANGIOGRAM;  Surgeon: [name], MD;  Location: [facility] CATH LAB;  Service: Cardiovascular;  Laterality:  N/A;    Current Outpatient Medications  Medication Sig Dispense Refill  . acetaminophen (TYLENOL) 500 MG tablet Take 500 mg by mouth as needed for moderate pain or headache.     Marland Kitchen amLODipine (NORVASC) 10 MG tablet Take 10 mg by mouth every evening.     . Ascorbic Acid (VITAMIN C) 1000 MG tablet Take 1,000 mg by mouth daily.    Marland Kitchen aspirin EC 81 MG tablet Take 81 mg by mouth every other day.     Marland Kitchen atorvastatin (LIPITOR) 20 MG tablet Take 20 mg by mouth every evening.     . cholecalciferol (VITAMIN D) 1000 UNITS tablet Take 1,000 Units by mouth every morning.    . Coenzyme Q10 (COQ-10) 200 MG CAPS Take 200 mg by mouth daily.     . dapagliflozin propanediol (FARXIGA) 10 MG TABS tablet Take 10 mg by mouth daily.    . Insulin Glargine (TOUJEO SOLOSTAR) 300 UNIT/ML SOPN Inject 50 Units into the skin at bedtime.    Marland Kitchen levothyroxine (SYNTHROID, LEVOTHROID) 75 MCG tablet Take 75 mcg by mouth every morning.     . Liraglutide (VICTOZA) 18 MG/3ML SOPN Inject 1.2 mg into the skin every morning.     . metFORMIN (GLUCOPHAGE) 500 MG tablet Take 1 tablet (500 mg total) by mouth 2 (two) times daily with a meal. (Patient taking differently: Take 500 mg by mouth daily. Takes 1 tablet daily.)    . olmesartan-hydrochlorothiazide (BENICAR HCT) 40-12.5 MG tablet Take 1 tablet by mouth daily.    . Omega-3 Fatty Acids (FISH OIL)  1200 MG CAPS Take 1,200 mg by mouth every other day.      No current facility-administered medications for this visit.     Allergies as of 04/21/2019 - Review Complete 04/21/2019  Allergen Reaction Noted  . Penicillins Hives 07/21/2011  . Hydrocodone Nausea And Vomiting 12/30/2017    Family History  Problem Relation Age of Onset  . Cancer Father   . Diabetes Father   . Colon cancer Neg Hx     Social History   Socioeconomic History  . Marital status: Widowed    Spouse name: Not on file  . Number of children: Not on file  . Years of education: Not on file  . Highest education  level: Not on file  Occupational History  . Not on file  Social Needs  . Financial resource strain: Not on file  . Food insecurity    Worry: Not on file    Inability: Not on file  . Transportation needs    Medical: Not on file    Non-medical: Not on file  Tobacco Use  . Smoking status: Never Smoker  . Smokeless tobacco: Never Used  Substance and Sexual Activity  . Alcohol use: No  . Drug use: No  . Sexual activity: Yes    Birth control/protection: Post-menopausal  Lifestyle  . Physical activity    Days per week: Not on file    Minutes per session: Not on file  . Stress: Not on file  Relationships  . Social Herbalist on phone: Not on file    Gets together: Not on file    Attends religious service: Not on file    Active member of club or organization: Not on file    Attends meetings of clubs or organizations: Not on file    Relationship status: Not on file  . Intimate partner violence    Fear of current or ex partner: Not on file    Emotionally abused: Not on file    Physically abused: Not on file    Forced sexual activity: Not on file  Other Topics Concern  . Not on file  Social History Narrative  . Not on file    Review of Systems: Gen: Denies any fever, chills, fatigue, weight loss, lack of appetite.  CV: Denies chest pain, heart palpitations, peripheral edema, syncope.  Resp: Denies shortness of breath at rest or with exertion. Denies wheezing or cough.  GI: see HPI GU : Denies urinary burning, urinary frequency, urinary hesitancy MS: Denies joint pain, muscle weakness, cramps, or limitation of movement.  Derm: Denies rash, itching, dry skin Psych: Denies depression, anxiety, memory loss, and confusion Heme: Denies bruising, bleeding, and enlarged lymph nodes.  Physical Exam: BP (!) 178/87   Pulse 76   Temp (!) 97.3 F (36.3 C) (Oral)   Ht 5\' 3"  (1.6 m)   Wt 164 lb (74.4 kg)   BMI 29.05 kg/m  General:   Alert and oriented. Pleasant and  cooperative. Well-nourished and well-developed.  Head:  Normocephalic and atraumatic. Eyes:  Without icterus, sclera clear and conjunctiva pink.  Ears:  Normal auditory acuity. Lungs:  Clear to auscultation bilaterally. No wheezes, rales, or rhonchi. No distress.  Heart:  S1, S2 present without murmurs appreciated.  Abdomen:  +BS, soft, non-tender and non-distended. No HSM noted. No guarding or rebound. No masses appreciated.  Rectal:  Deferred  Msk:  Symmetrical without gross deformities. Normal posture. Extremities:  Without edema. Neurologic:  Alert and  oriented x4 Psych:  Alert and cooperative. Normal mood and affect.  ASSESSMENT: Megan Schroeder is a 73 y.o. female presenting today with a history of multiple colon polyps in 2017 and due for 3-year-surveillance. She has no concerning lower or upper GI signs/symptoms. Due to needing increased sedation at last colonoscopy, will pursue with Propofol.    PLAN:  1. Proceed with TCS with Dr. Gala Romney in near future using Propofol: the risks, benefits, and alternatives have been discussed with the patient in detail. The patient states understanding and desires to proceed.  2. Return as needed   Annitta Needs, PhD, [facility] Gastroenterology

## 2019-04-21 ENCOUNTER — Other Ambulatory Visit: Payer: Self-pay

## 2019-04-21 ENCOUNTER — Encounter: Payer: Self-pay | Admitting: Gastroenterology

## 2019-04-21 ENCOUNTER — Ambulatory Visit: Payer: Medicare Other | Admitting: Gastroenterology

## 2019-04-21 VITALS — BP 178/87 | HR 76 | Temp 97.3°F | Ht 63.0 in | Wt 164.0 lb

## 2019-04-21 DIAGNOSIS — Z86010 Personal history of colonic polyps: Secondary | ICD-10-CM

## 2019-04-21 MED ORDER — PEG 3350-KCL-NA BICARB-NACL 420 G PO SOLR: 4000.0000 mL | ORAL | 0 refills | Status: DC

## 2019-04-21 NOTE — Patient Instructions (Signed)
We are arranging a colonoscopy with Dr. Gala Romney in the near future.  Please take 1/2 dose of insulin evening before, and no insulin the morning of.  Further recommendations to follow!  Have a great birthday!  It was a pleasure to see you today. I want to create trusting relationships with patients to provide genuine, compassionate, and quality care. I value your feedback. If you receive a survey regarding your visit,  I greatly appreciate you taking time to fill this out.   Annitta Needs, PhD, ANP-BC [facility] Gastroenterology

## 2019-04-21 NOTE — Progress Notes (Signed)
cc'ed to pcp °

## 2019-07-15 NOTE — Patient Instructions (Addendum)
KINGSLEIGH LOZOYA  07/15/2019     @PREFPERIOPPHARMACY @   Your procedure is scheduled on  07/20/2019   Report to [facility] at  [place].M.  Call this number if you have problems the morning of surgery:  [phone]   Remember:  Follow the diet and prep instructions given to you by Dr Roseanne Kaufman office.      Take only 1/2 dose of toujeo 25 units night before procedure                  Take these medicines the morning of surgery with A SIP OF WATER  Levothyroxine, olmesartan. DO NOT take any medications for diabetes the morning of your procedure.    Do not wear jewelry, make-up or nail polish.  Do not wear lotions, powders, or perfumes. Please wear deodorant and brush your teeth.  Do not shave 48 hours prior to surgery.  Men may shave face and neck.  Do not bring valuables to the hospital.  [facility] is not responsible for any belongings or valuables.  Contacts, dentures or bridgework may not be worn into surgery.  Leave your suitcase in the car.  After surgery it may be brought to your room.  For patients admitted to the hospital, discharge time will be determined by your treatment team.  Patients discharged the day of surgery will not be allowed to drive home.   Name and phone number of your driver:   family Special instructions:  None  Please read over the following fact sheets that you were given. Anesthesia Post-op Instructions and Care and Recovery After Surgery       Colonoscopy, Adult, Care After This sheet gives you information about how to care for yourself after your procedure. Your health care provider may also give you more specific instructions. If you have problems or questions, contact your health care provider. What can I expect after the procedure? After the procedure, it is common to have:  A small amount of blood in your stool for 24 hours after the procedure.  Some gas.  Mild cramping or bloating of your abdomen. Follow these instructions at  home: Eating and drinking   Drink enough fluid to keep your urine pale yellow.  Follow instructions from your health care provider about eating or drinking restrictions.  Resume your normal diet as instructed by your health care provider. Avoid heavy or fried foods that are hard to digest. Activity  Rest as told by your health care provider.  Avoid sitting for a long time without moving. Get up to take short walks every 1-2 hours. This is important to improve blood flow and breathing. Ask for help if you feel weak or unsteady.  Return to your normal activities as told by your health care provider. Ask your health care provider what activities are safe for you. Managing cramping and bloating   Try walking around when you have cramps or feel bloated.  Apply heat to your abdomen as told by your health care provider. Use the heat source that your health care provider recommends, such as a moist heat pack or a heating pad. ? Place a towel between your skin and the heat source. ? Leave the heat on for 20-30 minutes. ? Remove the heat if your skin turns bright red. This is especially important if you are unable to feel pain, heat, or cold. You may have a greater risk of getting burned. General instructions  For  the first 24 hours after the procedure: ? Do not drive or use machinery. ? Do not sign important documents. ? Do not drink alcohol. ? Do your regular daily activities at a slower pace than normal. ? Eat soft foods that are easy to digest.  Take over-the-counter and prescription medicines only as told by your health care provider.  Keep all follow-up visits as told by your health care provider. This is important. Contact a health care provider if:  You have blood in your stool 2-3 days after the procedure. Get help right away if you have:  More than a small spotting of blood in your stool.  Large blood clots in your stool.  Swelling of your abdomen.  Nausea or  vomiting.  A fever.  Increasing pain in your abdomen that is not relieved with medicine. Summary  After the procedure, it is common to have a small amount of blood in your stool. You may also have mild cramping and bloating of your abdomen.  For the first 24 hours after the procedure, do not drive or use machinery, sign important documents, or drink alcohol.  Get help right away if you have a lot of blood in your stool, nausea or vomiting, a fever, or increased pain in your abdomen. This information is not intended to replace advice given to you by your health care provider. Make sure you discuss any questions you have with your health care provider. Document Revised: 12/22/2018 Document Reviewed: 12/22/2018 Elsevier Patient Education  [place] After These instructions provide you with information about caring for yourself after your procedure. Your health care provider may also give you more specific instructions. Your treatment has been planned according to current medical practices, but problems sometimes occur. Call your health care provider if you have any problems or questions after your procedure. What can I expect after the procedure? After your procedure, you may:  Feel sleepy for several hours.  Feel clumsy and have poor balance for several hours.  Feel forgetful about what happened after the procedure.  Have poor judgment for several hours.  Feel nauseous or vomit.  Have a sore throat if you had a breathing tube during the procedure. Follow these instructions at home: For at least 24 hours after the procedure:      Have a responsible adult stay with you. It is important to have someone help care for you until you are awake and alert.  Rest as needed.  Do not: ? Participate in activities in which you could fall or become injured. ? Drive. ? Use heavy machinery. ? Drink alcohol. ? Take sleeping pills or medicines that  cause drowsiness. ? Make important decisions or sign legal documents. ? Take care of children on your own. Eating and drinking  Follow the diet that is recommended by your health care provider.  If you vomit, drink water, juice, or soup when you can drink without vomiting.  Make sure you have little or no nausea before eating solid foods. General instructions  Take over-the-counter and prescription medicines only as told by your health care provider.  If you have sleep apnea, surgery and certain medicines can increase your risk for breathing problems. Follow instructions from your health care provider about wearing your sleep device: ? Anytime you are sleeping, including during daytime naps. ? While taking prescription pain medicines, sleeping medicines, or medicines that make you drowsy.  If you smoke, do not smoke without supervision.  Keep all  follow-up visits as told by your health care provider. This is important. Contact a health care provider if:  You keep feeling nauseous or you keep vomiting.  You feel light-headed.  You develop a rash.  You have a fever. Get help right away if:  You have trouble breathing. Summary  For several hours after your procedure, you may feel sleepy and have poor judgment.  Have a responsible adult stay with you for at least 24 hours or until you are awake and alert. This information is not intended to replace advice given to you by your health care provider. Make sure you discuss any questions you have with your health care provider. Document Revised: 08/26/2017 Document Reviewed: 09/18/2015 Elsevier Patient Education  [place].

## 2019-07-17 ENCOUNTER — Encounter (HOSPITAL_COMMUNITY): Payer: Self-pay

## 2019-07-17 ENCOUNTER — Other Ambulatory Visit: Payer: Self-pay

## 2019-07-17 ENCOUNTER — Encounter (HOSPITAL_COMMUNITY)
Admission: RE | Admit: 2019-07-17 | Discharge: 2019-07-17 | Disposition: A | Discharge status: Home / Self Care | Payer: Medicare Other | Source: Ambulatory Visit | Attending: Internal Medicine | Admitting: Internal Medicine

## 2019-07-17 ENCOUNTER — Other Ambulatory Visit (HOSPITAL_COMMUNITY)
Admission: RE | Admit: 2019-07-17 | Discharge: 2019-07-17 | Disposition: A | Discharge status: Home / Self Care | Payer: Medicare Other | Source: Ambulatory Visit | Attending: Internal Medicine | Admitting: Internal Medicine

## 2019-07-17 DIAGNOSIS — I10 Essential (primary) hypertension: Secondary | ICD-10-CM | POA: Insufficient documentation

## 2019-07-17 DIAGNOSIS — Z20822 Contact with and (suspected) exposure to covid-19: Secondary | ICD-10-CM | POA: Insufficient documentation

## 2019-07-17 DIAGNOSIS — Z01810 Encounter for preprocedural cardiovascular examination: Secondary | ICD-10-CM | POA: Insufficient documentation

## 2019-07-17 DIAGNOSIS — Z01812 Encounter for preprocedural laboratory examination: Secondary | ICD-10-CM | POA: Insufficient documentation

## 2019-07-17 LAB — SARS CORONAVIRUS 2 (TAT 6-24 HRS): SARS Coronavirus 2: NEGATIVE

## 2019-07-17 LAB — BASIC METABOLIC PANEL
Anion gap: 15 (ref 5–15)
BUN: 12 mg/dL (ref 8–23)
CO2: 23 mmol/L (ref 22–32)
Calcium: 9.6 mg/dL (ref 8.9–10.3)
Chloride: 98 mmol/L (ref 98–111)
Creatinine, Ser: 0.69 mg/dL (ref 0.44–1.00)
GFR calc Af Amer: >60 mL/min (ref >60)
GFR calc non Af Amer: >60 mL/min (ref >60)
Glucose, Bld: 281 mg/dL — ABNORMAL HIGH (ref 70–99)
Potassium: 3.6 mmol/L (ref 3.5–5.1)
Sodium: 136 mmol/L (ref 135–145)

## 2019-07-20 ENCOUNTER — Encounter (HOSPITAL_COMMUNITY)
Admission: RE | Disposition: A | Discharge status: Home / Self Care | Payer: Self-pay | Source: Home / Self Care | Attending: Internal Medicine

## 2019-07-20 ENCOUNTER — Ambulatory Visit (HOSPITAL_COMMUNITY): Payer: Medicare Other | Admitting: Anesthesiology

## 2019-07-20 ENCOUNTER — Encounter (HOSPITAL_COMMUNITY): Payer: Self-pay | Admitting: Internal Medicine

## 2019-07-20 ENCOUNTER — Other Ambulatory Visit: Payer: Self-pay

## 2019-07-20 ENCOUNTER — Ambulatory Visit (HOSPITAL_COMMUNITY)
Admission: RE | Admit: 2019-07-20 | Discharge: 2019-07-20 | Disposition: A | Discharge status: Home / Self Care | Payer: Medicare Other | Attending: Internal Medicine | Admitting: Internal Medicine

## 2019-07-20 DIAGNOSIS — Z86010 Personal history of colonic polyps: Secondary | ICD-10-CM | POA: Insufficient documentation

## 2019-07-20 DIAGNOSIS — E119 Type 2 diabetes mellitus without complications: Secondary | ICD-10-CM | POA: Diagnosis not present

## 2019-07-20 DIAGNOSIS — K5730 Diverticulosis of large intestine without perforation or abscess without bleeding: Secondary | ICD-10-CM | POA: Diagnosis not present

## 2019-07-20 DIAGNOSIS — R000 Tachycardia, unspecified: Secondary | ICD-10-CM | POA: Diagnosis not present

## 2019-07-20 DIAGNOSIS — Z1211 Encounter for screening for malignant neoplasm of colon: Secondary | ICD-10-CM | POA: Diagnosis present

## 2019-07-20 DIAGNOSIS — Z809 Family history of malignant neoplasm, unspecified: Secondary | ICD-10-CM | POA: Diagnosis not present

## 2019-07-20 DIAGNOSIS — Z833 Family history of diabetes mellitus: Secondary | ICD-10-CM | POA: Insufficient documentation

## 2019-07-20 DIAGNOSIS — Z880 Allergy status to penicillin: Secondary | ICD-10-CM | POA: Insufficient documentation

## 2019-07-20 DIAGNOSIS — E785 Hyperlipidemia, unspecified: Secondary | ICD-10-CM | POA: Insufficient documentation

## 2019-07-20 DIAGNOSIS — Z885 Allergy status to narcotic agent status: Secondary | ICD-10-CM | POA: Diagnosis not present

## 2019-07-20 DIAGNOSIS — I10 Essential (primary) hypertension: Secondary | ICD-10-CM | POA: Insufficient documentation

## 2019-07-20 DIAGNOSIS — Z9842 Cataract extraction status, left eye: Secondary | ICD-10-CM | POA: Insufficient documentation

## 2019-07-20 DIAGNOSIS — Z794 Long term (current) use of insulin: Secondary | ICD-10-CM | POA: Diagnosis not present

## 2019-07-20 DIAGNOSIS — R519 Headache, unspecified: Secondary | ICD-10-CM | POA: Diagnosis not present

## 2019-07-20 DIAGNOSIS — Z7982 Long term (current) use of aspirin: Secondary | ICD-10-CM | POA: Insufficient documentation

## 2019-07-20 DIAGNOSIS — D122 Benign neoplasm of ascending colon: Secondary | ICD-10-CM | POA: Diagnosis not present

## 2019-07-20 DIAGNOSIS — Z9841 Cataract extraction status, right eye: Secondary | ICD-10-CM | POA: Diagnosis not present

## 2019-07-20 DIAGNOSIS — D124 Benign neoplasm of descending colon: Secondary | ICD-10-CM | POA: Insufficient documentation

## 2019-07-20 DIAGNOSIS — Z961 Presence of intraocular lens: Secondary | ICD-10-CM | POA: Insufficient documentation

## 2019-07-20 DIAGNOSIS — K635 Polyp of colon: Secondary | ICD-10-CM

## 2019-07-20 HISTORY — PX: POLYPECTOMY: SHX5525

## 2019-07-20 HISTORY — PX: COLONOSCOPY WITH PROPOFOL: SHX5780

## 2019-07-20 LAB — GLUCOSE, CAPILLARY
Glucose-Capillary: 183 mg/dL — ABNORMAL HIGH (ref 70–99)
Glucose-Capillary: 169 mg/dL — ABNORMAL HIGH (ref 70–99)

## 2019-07-20 SURGERY — COLONOSCOPY WITH PROPOFOL
Anesthesia: General

## 2019-07-20 MED ORDER — CHLORHEXIDINE GLUCONATE CLOTH 2 % EX PADS: 6.0000 | MEDICATED_PAD | Freq: Once | CUTANEOUS | Status: DC

## 2019-07-20 MED ORDER — LACTATED RINGERS IV SOLN: INTRAVENOUS | Status: DC | PRN

## 2019-07-20 MED ORDER — LACTATED RINGERS IV SOLN: Freq: Once | INTRAVENOUS | Status: AC

## 2019-07-20 MED ORDER — PROPOFOL 500 MG/50ML IV EMUL
INTRAVENOUS | Status: DC | PRN
  Administered 2019-07-20: 150 ug/kg/min via INTRAVENOUS

## 2019-07-20 MED ORDER — PROPOFOL 10 MG/ML IV BOLUS
INTRAVENOUS | Status: DC | PRN
  Administered 2019-07-20: 70 mg via INTRAVENOUS

## 2019-07-20 MED ORDER — PHENYLEPHRINE HCL (PRESSORS) 10 MG/ML IV SOLN
INTRAVENOUS | Status: DC | PRN
  Administered 2019-07-20 (×6): 100 ug via INTRAVENOUS

## 2019-07-20 MED ORDER — PROPOFOL 10 MG/ML IV BOLUS
INTRAVENOUS | Status: AC
  Filled 2019-07-20: qty 40

## 2019-07-20 MED ORDER — STERILE WATER FOR IRRIGATION IR SOLN
Status: DC | PRN
  Administered 2019-07-20: 2.5 mL

## 2019-07-20 NOTE — Anesthesia Preprocedure Evaluation (Signed)
Anesthesia Evaluation  Patient identified by MRN, date of birth, ID band Patient awake    Reviewed: Allergy & Precautions, NPO status , Patient's Chart, lab work & pertinent test results  Airway Mallampati: III  TM Distance: >3 FB Neck ROM: Full    Dental  (+) Lower Dentures, Upper Dentures   Pulmonary neg pulmonary ROS,    Pulmonary exam normal breath sounds clear to auscultation       Cardiovascular Exercise Tolerance: Good hypertension, Pt. on medications  Rhythm:Regular Rate:Tachycardia + Systolic murmurs [id number] 10:51:04 [facility] ROUTINE RECORD Sinus tachycardia Otherwise normal ECG No significant change since last tracing Confirmed by Shelva Majestic [phone]) on 07/17/2019 7:51:34 PM Patient is tachycardic, as per patient she is anxious whenever she comes to the hospital.    Neuro/Psych  Headaches,    GI/Hepatic Bowel prep,  Endo/Other  diabetes, Poorly Controlled, Type 2, Oral Hypoglycemic AgentsHypothyroidism   Renal/GU      Musculoskeletal negative musculoskeletal ROS (+)   Abdominal   Peds  Hematology   Anesthesia Other Findings   Reproductive/Obstetrics negative OB ROS                             Anesthesia Physical Anesthesia Plan  ASA: III  Anesthesia Plan: General   Post-op Pain Management:    Induction: Intravenous  PONV Risk Score and Plan:   Airway Management Planned: Nasal Cannula, Natural Airway and Simple Face Mask  Additional Equipment:   Intra-op Plan:   Post-operative Plan:   Informed Consent: I have reviewed the patients History and Physical, chart, labs and discussed the procedure including the risks, benefits and alternatives for the proposed anesthesia with the patient or authorized representative who has indicated his/her understanding and acceptance.     Dental advisory given  Plan Discussed with: CRNA  Anesthesia  Plan Comments:         Anesthesia Quick Evaluation

## 2019-07-20 NOTE — Anesthesia Postprocedure Evaluation (Signed)
Anesthesia Post Note  Patient: Megan Schroeder  Procedure(s) Performed: COLONOSCOPY WITH PROPOFOL (N/A ) POLYPECTOMY  Patient location during evaluation: PACU Anesthesia Type: MAC Level of consciousness: awake, oriented, awake and alert and patient cooperative Pain management: pain level controlled Vital Signs Assessment: post-procedure vital signs reviewed and stable Respiratory status: spontaneous breathing, respiratory function stable and nonlabored ventilation Cardiovascular status: stable Postop Assessment: no apparent nausea or vomiting Anesthetic complications: no     Last Vitals:  Vitals:   07/20/19 0655 07/20/19 0905  BP: 137/82 (!) 84/56  Pulse: (!) 104 86  Resp: 14 12  Temp: 37 C (P) 36.6 C  SpO2: 98% 99%    Last Pain:  Vitals:   07/20/19 0655  TempSrc: Oral  PainSc: 0-No pain                 Quintarius Ferns

## 2019-07-20 NOTE — Op Note (Signed)
[facility] Patient Name: Megan Schroeder Procedure Date: 07/20/2019 8:14 AM MRN: [id number] Date of Birth: 1946/04/27 Attending MD: Norvel Richards , MD CSN: [id number] Age: 74 Admit Type: Outpatient Procedure:                Colonoscopy Indications:              High risk colon cancer surveillance: Personal                            history of colonic polyps Providers:                Norvel Richards, MD, Lurline Del, RN, Raphael Gibney, Technician Referring MD:              Medicines:                Propofol per Anesthesia Complications:            No immediate complications. Estimated Blood Loss:     Estimated blood loss was minimal. Procedure:                Pre-Anesthesia Assessment:                           - Prior to the procedure, a History and Physical                            was performed, and patient medications and                            allergies were reviewed. The patient's tolerance of                            previous anesthesia was also reviewed. The risks                            and benefits of the procedure and the sedation                            options and risks were discussed with the patient.                            All questions were answered, and informed consent                            was obtained. Prior Anticoagulants: The patient has                            taken no previous anticoagulant or antiplatelet                            agents. ASA Grade Assessment: III - A patient with  severe systemic disease. After reviewing the risks                            and benefits, the patient was deemed in                            satisfactory condition to undergo the procedure.                           After obtaining informed consent, the colonoscope                            was passed under direct vision. Throughout the                            procedure, the  patient's blood pressure, pulse, and                            oxygen saturations were monitored continuously. The                            [id number] scope was introduced through                            the anus and advanced to the the cecum, identified                            by appendiceal orifice and ileocecal valve. The                            colonoscopy was performed without difficulty. The                            patient tolerated the procedure well. The quality                            of the bowel preparation was adequate. Scope In: 8:36:07 AM Scope Out: 8:57:30 AM Scope Withdrawal Time: 0 hours 13 minutes 50 seconds  Total Procedure Duration: 0 hours 21 minutes 23 seconds  Findings:      The perianal and digital rectal examinations were normal.      Scattered small and large-mouthed diverticula were found in the entire       colon.      Multiple semi-pedunculated polyps were found in the entire colon (3-6 mm       in size. 4 total in number). Removed with cold snare technique..      The exam was otherwise without abnormality on direct and retroflexion       views. Impression:               - Diverticulosis in the entire examined colon.                           - Multiple polyps in the entire colon. Status post  removal                           - The examination was otherwise normal on direct                            and retroflexion views. Moderate Sedation:      Moderate (conscious) sedation was personally administered by an       anesthesia professional. The following parameters were monitored: oxygen       saturation, heart rate, blood pressure, respiratory rate, EKG, adequacy       of pulmonary ventilation, and response to care. Recommendation:           - Patient has a contact number available for                            emergencies. The signs and symptoms of potential                            delayed  complications were discussed with the                            patient. Return to normal activities tomorrow.                            Written discharge instructions were provided to the                            patient.                           - Resume previous diet.                           - Continue present medications.                           - Repeat colonoscopy for surveillance based on                            pathology results.                           - Return to GI office after studies are complete. Procedure Code(s):        --- Professional ---                           [phone], Colonoscopy, flexible; diagnostic, including                            collection of specimen(s) by brushing or washing,                            when performed (separate procedure) Diagnosis Code(s):        --- Professional ---  Z86.010, Personal history of colonic polyps                           K63.5, Polyp of colon                           K57.30, Diverticulosis of large intestine without                            perforation or abscess without bleeding CPT copyright 2019 American Medical Association. All rights reserved. The codes documented in this report are preliminary and upon coder review may  be revised to meet current compliance requirements. Cristopher Estimable. Dashiel Bergquist, MD Norvel Richards, MD 07/20/2019 9:07:11 AM This report has been signed electronically. Number of Addenda: 0

## 2019-07-20 NOTE — Discharge Instructions (Signed)
Colonoscopy Discharge Instructions  Read the instructions outlined below and refer to this sheet in the next few weeks. These discharge instructions provide you with general information on caring for yourself after you leave the hospital. Your doctor may also give you specific instructions. While your treatment has been planned according to the most current medical practices available, unavoidable complications occasionally occur. If you have any problems or questions after discharge, call Dr. Gala Romney at [phone]. ACTIVITY  You may resume your regular activity, but move at a slower pace for the next 24 hours.   Take frequent rest periods for the next 24 hours.   Walking will help get rid of the air and reduce the bloated feeling in your belly (abdomen).   No driving for 24 hours (because of the medicine (anesthesia) used during the test).    Do not sign any important legal documents or operate any machinery for 24 hours (because of the anesthesia used during the test).  NUTRITION  Drink plenty of fluids.   You may resume your normal diet as instructed by your doctor.   Begin with a light meal and progress to your normal diet. Heavy or fried foods are harder to digest and may make you feel sick to your stomach (nauseated).   Avoid alcoholic beverages for 24 hours or as instructed.  MEDICATIONS  You may resume your normal medications unless your doctor tells you otherwise.  WHAT YOU CAN EXPECT TODAY  Some feelings of bloating in the abdomen.   Passage of more gas than usual.   Spotting of blood in your stool or on the toilet paper.  IF YOU HAD POLYPS REMOVED DURING THE COLONOSCOPY:  No aspirin products for 7 days or as instructed.   No alcohol for 7 days or as instructed.   Eat a soft diet for the next 24 hours.  FINDING OUT THE RESULTS OF YOUR TEST Not all test results are available during your visit. If your test results are not back during the visit, make an appointment  with your caregiver to find out the results. Do not assume everything is normal if you have not heard from your caregiver or the medical facility. It is important for you to follow up on all of your test results.  SEEK IMMEDIATE MEDICAL ATTENTION IF:  You have more than a spotting of blood in your stool.   Your belly is swollen (abdominal distention).   You are nauseated or vomiting.   You have a temperature over 101.   You have abdominal pain or discomfort that is severe or gets worse throughout the day.   Colon polyp and diverticulosis information provided (4 small polyps found and removed today)  Further recommendations to follow pending review of pathology report   At patient request, I spoke to [employer] at [phone]     Colon Polyps  Polyps are tissue growths inside the body. Polyps can grow in many places, including the large intestine (colon). A polyp may be a round bump or a mushroom-shaped growth. You could have one polyp or several. Most colon polyps are noncancerous (benign). However, some colon polyps can become cancerous over time. Finding and removing the polyps early can help prevent this. What are the causes? The exact cause of colon polyps is not known. What increases the risk? You are more likely to develop this condition if you:  Have a family history of colon cancer or colon polyps.  Are older than 50 or older than 45 if  you are African American.  Have inflammatory bowel disease, such as ulcerative colitis or Crohn's disease.  Have certain hereditary conditions, such as: ? Familial adenomatous polyposis. ? Lynch syndrome. ? Turcot syndrome. ? Peutz-Jeghers syndrome.  Are overweight.  Smoke cigarettes.  Do not get enough exercise.  Drink too much alcohol.  Eat a diet that is high in fat and red meat and low in fiber.  Had childhood cancer that was treated with abdominal radiation. What are the signs or symptoms? Most polyps do not cause  symptoms. If you have symptoms, they may include:  Blood coming from your rectum when having a bowel movement.  Blood in your stool. The stool may look dark red or black.  Abdominal pain.  A change in bowel habits, such as constipation or diarrhea. How is this diagnosed? This condition is diagnosed with a colonoscopy. This is a procedure in which a lighted, flexible scope is inserted into the anus and then passed into the colon to examine the area. Polyps are sometimes found when a colonoscopy is done as part of routine cancer screening tests. How is this treated? Treatment for this condition involves removing any polyps that are found. Most polyps can be removed during a colonoscopy. Those polyps will then be tested for cancer. Additional treatment may be needed depending on the results of testing. Follow these instructions at home: Lifestyle  Maintain a healthy weight, or lose weight if recommended by your health care provider.  Exercise every day or as told by your health care provider.  Do not use any products that contain nicotine or tobacco, such as cigarettes and e-cigarettes. If you need help quitting, ask your health care provider.  If you drink alcohol, limit how much you have: ? 0-1 drink a day for women. ? 0-2 drinks a day for men.  Be aware of how much alcohol is in your drink. In the [place] one drink equals one 12 oz bottle of beer (355 mL), one 5 oz glass of wine (148 mL), or one 1 oz shot of hard liquor (44 mL). Eating and drinking   Eat foods that are high in fiber, such as fruits, vegetables, and whole grains.  Eat foods that are high in calcium and vitamin D, such as milk, cheese, yogurt, eggs, liver, fish, and broccoli.  Limit foods that are high in fat, such as fried foods and desserts.  Limit the amount of red meat and processed meat you eat, such as hot dogs, sausage, bacon, and lunch meats. General instructions  Keep all follow-up visits as told by your  health care provider. This is important. ? This includes having regularly scheduled colonoscopies. ? Talk to your health care provider about when you need a colonoscopy. Contact a health care provider if:  You have new or worsening bleeding during a bowel movement.  You have new or increased blood in your stool.  You have a change in bowel habits.  You lose weight for no known reason. Summary  Polyps are tissue growths inside the body. Polyps can grow in many places, including the colon.  Most colon polyps are noncancerous (benign), but some can become cancerous over time.  This condition is diagnosed with a colonoscopy.  Treatment for this condition involves removing any polyps that are found. Most polyps can be removed during a colonoscopy. This information is not intended to replace advice given to you by your health care provider. Make sure you discuss any questions you have with your health  care provider. Document Revised: 09/12/2017 Document Reviewed: 09/12/2017 Elsevier Patient Education  [place].    Diverticulosis  Diverticulosis is a condition that develops when small pouches (diverticula) form in the wall of the large intestine (colon). The colon is where water is absorbed and stool (feces) is formed. The pouches form when the inside layer of the colon pushes through weak spots in the outer layers of the colon. You may have a few pouches or many of them. The pouches usually do not cause problems unless they become inflamed or infected. When this happens, the condition is called diverticulitis. What are the causes? The cause of this condition is not known. What increases the risk? The following factors may make you more likely to develop this condition:  Being older than age 76. Your risk for this condition increases with age. Diverticulosis is rare among people younger than age 60. By age 76, many people have it.  Eating a low-fiber diet.  Having frequent  constipation.  Being overweight.  Not getting enough exercise.  Smoking.  Taking over-the-counter pain medicines, like aspirin and ibuprofen.  Having a family history of diverticulosis. What are the signs or symptoms? In most people, there are no symptoms of this condition. If you do have symptoms, they may include:  Bloating.  Cramps in the abdomen.  Constipation or diarrhea.  Pain in the lower left side of the abdomen. How is this diagnosed? Because diverticulosis usually has no symptoms, it is most often diagnosed during an exam for other colon problems. The condition may be diagnosed by:  Using a flexible scope to examine the colon (colonoscopy).  Taking an X-ray of the colon after dye has been put into the colon (barium enema).  Having a CT scan. How is this treated? You may not need treatment for this condition. Your health care provider may recommend treatment to prevent problems. You may need treatment if you have symptoms or if you previously had diverticulitis. Treatment may include:  Eating a high-fiber diet.  Taking a fiber supplement.  Taking a live bacteria supplement (probiotic).  Taking medicine to relax your colon. Follow these instructions at home: Medicines  Take over-the-counter and prescription medicines only as told by your health care provider.  If told by your health care provider, take a fiber supplement or probiotic. Constipation prevention Your condition may cause constipation. To prevent or treat constipation, you may need to:  Drink enough fluid to keep your urine pale yellow.  Take over-the-counter or prescription medicines.  Eat foods that are high in fiber, such as beans, whole grains, and fresh fruits and vegetables.  Limit foods that are high in fat and processed sugars, such as fried or sweet foods.  General instructions  Try not to strain when you have a bowel movement.  Keep all follow-up visits as told by your health  care provider. This is important. Contact a health care provider if you:  Have pain in your abdomen.  Have bloating.  Have cramps.  Have not had a bowel movement in 3 days. Get help right away if:  Your pain gets worse.  Your bloating becomes very bad.  You have a fever or chills, and your symptoms suddenly get worse.  You vomit.  You have bowel movements that are bloody or black.  You have bleeding from your rectum. Summary  Diverticulosis is a condition that develops when small pouches (diverticula) form in the wall of the large intestine (colon).  You may have a  few pouches or many of them.  This condition is most often diagnosed during an exam for other colon problems.  Treatment may include increasing the fiber in your diet, taking supplements, or taking medicines. This information is not intended to replace advice given to you by your health care provider. Make sure you discuss any questions you have with your health care provider. Document Revised: 12/25/2018 Document Reviewed: 12/25/2018 Elsevier Patient Education  [place] After These instructions provide you with information about caring for yourself after your procedure. Your health care provider may also give you more specific instructions. Your treatment has been planned according to current medical practices, but problems sometimes occur. Call your health care provider if you have any problems or questions after your procedure. What can I expect after the procedure? After your procedure, you may:  Feel sleepy for several hours.  Feel clumsy and have poor balance for several hours.  Feel forgetful about what happened after the procedure.  Have poor judgment for several hours.  Feel nauseous or vomit.  Have a sore throat if you had a breathing tube during the procedure. Follow these instructions at home: For at least 24 hours after the procedure:       Have a responsible adult stay with you. It is important to have someone help care for you until you are awake and alert.  Rest as needed.  Do not: ? Participate in activities in which you could fall or become injured. ? Drive. ? Use heavy machinery. ? Drink alcohol. ? Take sleeping pills or medicines that cause drowsiness. ? Make important decisions or sign legal documents. ? Take care of children on your own. Eating and drinking  Follow the diet that is recommended by your health care provider.  If you vomit, drink water, juice, or soup when you can drink without vomiting.  Make sure you have little or no nausea before eating solid foods. General instructions  Take over-the-counter and prescription medicines only as told by your health care provider.  If you have sleep apnea, surgery and certain medicines can increase your risk for breathing problems. Follow instructions from your health care provider about wearing your sleep device: ? Anytime you are sleeping, including during daytime naps. ? While taking prescription pain medicines, sleeping medicines, or medicines that make you drowsy.  If you smoke, do not smoke without supervision.  Keep all follow-up visits as told by your health care provider. This is important. Contact a health care provider if:  You keep feeling nauseous or you keep vomiting.  You feel light-headed.  You develop a rash.  You have a fever. Get help right away if:  You have trouble breathing. Summary  For several hours after your procedure, you may feel sleepy and have poor judgment.  Have a responsible adult stay with you for at least 24 hours or until you are awake and alert. This information is not intended to replace advice given to you by your health care provider. Make sure you discuss any questions you have with your health care provider. Document Revised: 08/26/2017 Document Reviewed: 09/18/2015 Elsevier Patient Education  [place].

## 2019-07-20 NOTE — Transfer of Care (Signed)
Immediate Anesthesia Transfer of Care Note  Patient: Megan Schroeder  Procedure(s) Performed: COLONOSCOPY WITH PROPOFOL (N/A ) POLYPECTOMY  Patient Location: PACU  Anesthesia Type:MAC  Level of Consciousness: awake, alert , sedated and patient cooperative  Airway & Oxygen Therapy: Patient Spontanous Breathing  Post-op Assessment: Report given to RN, Post -op Vital signs reviewed and stable and Patient moving all extremities X 4  Post vital signs: Reviewed and stable  Last Vitals:  Vitals Value Taken Time  BP 84/56 07/20/19 0905  Temp    Pulse 88 07/20/19 0906  Resp 19 07/20/19 0906  SpO2 98 % 07/20/19 0906  Vitals shown include unvalidated device data.  Last Pain:  Vitals:   07/20/19 0655  TempSrc: Oral  PainSc: 0-No pain      Patients Stated Pain Goal: 4 (09/81/19 1478)  Complications: No apparent anesthesia complications

## 2019-07-20 NOTE — H&P (Signed)
@LOGO @   Primary Care Physician:  Lucia Gaskins, MD Primary Gastroenterologist:  Dr. Gala Romney  Pre-Procedure History & Physical: HPI:  Megan Schroeder is a 74 y.o. female here for a surveillance colonoscopy.  Multiple colonic adenomas and serrated polyps removed 2017.  Largest 1.5 cm.  Pancolonic diverticulosis.  Past Medical History:  Diagnosis Date  . Diabetes mellitus   . Hyperlipidemia   . Hypertension   . Thyroid disease     Past Surgical History:  Procedure Laterality Date  . BREAST EXCISIONAL BIOPSY Left   . CATARACT EXTRACTION W/PHACO Left 12/16/2017   Procedure: CATARACT EXTRACTION PHACO AND INTRAOCULAR LENS PLACEMENT (IOC);  Surgeon: [name], MD;  Location: [facility];  Service: Ophthalmology;  Laterality: Left;  CDE: 11.65  . CATARACT EXTRACTION W/PHACO Right 12/30/2017   Procedure: CATARACT EXTRACTION PHACO AND INTRAOCULAR LENS PLACEMENT RIGHT EYE;  Surgeon: [name], MD;  Location: [facility];  Service: Ophthalmology;  Laterality: Right;  CDE: 9.07  . COLONOSCOPY N/A 09/15/2015   Dr. Dudley Major multiple rectal and colonic polyps removed. Hepatic flexure with 9 mm polyp, multiple 5-7 mm polyps. Multiple cecal polyps with largest 1.5 cm, one 5 mm polyp in rectum. Path for colonic polyps with sessile serrated polyp without dysplasia, no high grade dysplasia, tubular adenomas, rectal hyperplastic polyp  . LEFT HEART CATHETERIZATION WITH CORONARY ANGIOGRAM N/A 07/23/2011   Procedure: LEFT HEART CATHETERIZATION WITH CORONARY ANGIOGRAM;  Surgeon: [name], MD;  Location: [facility] CATH LAB;  Service: Cardiovascular;  Laterality: N/A;    Prior to Admission medications   Medication Sig Start Date End Date Taking? Authorizing Provider  amLODipine (NORVASC) 10 MG tablet Take 10 mg by mouth every evening.    Yes [provider]  Ascorbic Acid (VITAMIN C) 1000 MG tablet Take 1,000 mg by mouth daily.   Yes [provider]  aspirin EC 81 MG tablet Take 81 mg by mouth at bedtime.     Yes [provider]  atorvastatin (LIPITOR) 20 MG tablet Take 20 mg by mouth every evening.    Yes [provider]  cholecalciferol (VITAMIN D) 1000 UNITS tablet Take 1,000 Units by mouth daily.    Yes [provider]  Coenzyme Q10 (COQ-10) 200 MG CAPS Take 200 mg by mouth daily.    Yes [provider]  dapagliflozin propanediol (FARXIGA) 10 MG TABS tablet Take 10 mg by mouth daily.   Yes [provider]  levothyroxine (SYNTHROID, LEVOTHROID) 75 MCG tablet Take 75 mcg by mouth daily before breakfast.    Yes [provider]  Liraglutide (VICTOZA) 18 MG/3ML SOPN Inject 1.2 mg into the skin daily.    Yes [provider]  metFORMIN (GLUCOPHAGE) 500 MG tablet Take 1 tablet (500 mg total) by mouth 2 (two) times daily with a meal. Patient taking differently: Take 500 mg by mouth 2 (two) times daily.  07/24/11  Yes Hongalgi, Lenis Dickinson, MD  olmesartan-hydrochlorothiazide (BENICAR HCT) 40-12.5 MG tablet Take 1 tablet by mouth daily.   Yes [provider]  Omega-3 Fatty Acids (FISH OIL) 1200 MG CAPS Take 1,200 mg by mouth every other day.    Yes [provider]  polyethylene glycol-electrolytes (TRILYTE) 420 g solution Take 4,000 mLs by mouth as directed. 04/21/19  Yes Canon Gola, Cristopher Estimable, MD  TOUJEO SOLOSTAR 300 UNIT/ML SOPN Inject 50 Units into the skin at bedtime.  06/17/19  Yes [provider]    Allergies as of 04/21/2019 - Review Complete 04/21/2019  Allergen Reaction  Noted  . Penicillins Hives 07/21/2011  . Hydrocodone Nausea And Vomiting 12/30/2017    Family History  Problem Relation Age of Onset  . Cancer Father   . Diabetes Father   . Colon cancer Neg Hx     Social History   Socioeconomic History  . Marital status: Widowed    Spouse name: Not on file  . Number of children: Not on file  . Years of education: Not on file  . Highest education level: Not on file  Occupational History  . Not on file   Tobacco Use  . Smoking status: Never Smoker  . Smokeless tobacco: Never Used  Substance and Sexual Activity  . Alcohol use: No  . Drug use: No  . Sexual activity: Yes    Birth control/protection: Post-menopausal  Other Topics Concern  . Not on file  Social History Narrative  . Not on file   Social Determinants of Health   Financial Resource Strain:   . Difficulty of Paying Living Expenses: Not on file  Food Insecurity:   . Worried About Charity fundraiser in the Last Year: Not on file  . Ran Out of Food in the Last Year: Not on file  Transportation Needs:   . Lack of Transportation (Medical): Not on file  . Lack of Transportation (Non-Medical): Not on file  Physical Activity:   . Days of Exercise per Week: Not on file  . Minutes of Exercise per Session: Not on file  Stress:   . Feeling of Stress : Not on file  Social Connections:   . Frequency of Communication with Friends and Family: Not on file  . Frequency of Social Gatherings with Friends and Family: Not on file  . Attends Religious Services: Not on file  . Active Member of Clubs or Organizations: Not on file  . Attends Archivist Meetings: Not on file  . Marital Status: Not on file  Intimate Partner Violence:   . Fear of Current or Ex-Partner: Not on file  . Emotionally Abused: Not on file  . Physically Abused: Not on file  . Sexually Abused: Not on file    Review of Systems: See HPI, otherwise negative ROS  Physical Exam: BP 137/82   Pulse (!) 104   Temp 98.6 F (37 C) (Oral)   Resp 14   Ht 5\' 4"  (1.626 m)   Wt 70.3 kg   SpO2 98%   BMI 26.61 kg/m  General:   Alert,  Well-developed, well-nourished, pleasant and cooperative in NAD Mouth:  No deformity or lesions. Neck:  Supple; no masses or thyromegaly. No significant cervical adenopathy. Lungs:  Clear throughout to auscultation.   No wheezes, crackles, or rhonchi. No acute distress. Heart:  Regular rate and rhythm; no murmurs, clicks,  rubs,  or gallops. Abdomen: Non-distended, normal bowel sounds.  Soft and nontender without appreciable mass or hepatosplenomegaly.  Pulses:  Normal pulses noted. Extremities:  Without clubbing or edema.  Impression/Plan: 74 year old lady with a history of multiple colonic adenomas/serrated polyps removed 2017.  Here for surveillance examination. The risks, benefits, limitations, alternatives and imponderables have been reviewed with the patient. Questions have been answered. All parties are agreeable.     Notice: This dictation was prepared with Dragon dictation along with smaller phrase technology. Any transcriptional errors that result from this process are unintentional and may not be corrected upon review.

## 2019-07-21 ENCOUNTER — Encounter: Payer: Self-pay | Admitting: Internal Medicine

## 2019-07-21 LAB — SURGICAL PATHOLOGY

## 2019-07-22 NOTE — Addendum Note (Signed)
Addendum  created 07/22/19 0910 by Vista Deck, CRNA   Charge Capture section accepted

## 2020-05-16 ENCOUNTER — Encounter (HOSPITAL_COMMUNITY): Payer: Self-pay

## 2020-05-16 ENCOUNTER — Emergency Department (HOSPITAL_COMMUNITY): Payer: Medicare Other

## 2020-05-16 ENCOUNTER — Observation Stay (HOSPITAL_COMMUNITY)
Admission: EM | Admit: 2020-05-16 | Discharge: 2020-05-17 | Disposition: A | Discharge status: Home / Self Care | Payer: Medicare Other | Attending: Family Medicine | Admitting: Family Medicine

## 2020-05-16 ENCOUNTER — Other Ambulatory Visit: Payer: Self-pay

## 2020-05-16 DIAGNOSIS — R072 Precordial pain: Principal | ICD-10-CM

## 2020-05-16 DIAGNOSIS — I2510 Atherosclerotic heart disease of native coronary artery without angina pectoris: Secondary | ICD-10-CM | POA: Diagnosis not present

## 2020-05-16 DIAGNOSIS — I10 Essential (primary) hypertension: Secondary | ICD-10-CM | POA: Diagnosis present

## 2020-05-16 DIAGNOSIS — R079 Chest pain, unspecified: Secondary | ICD-10-CM | POA: Diagnosis present

## 2020-05-16 DIAGNOSIS — Z7984 Long term (current) use of oral hypoglycemic drugs: Secondary | ICD-10-CM | POA: Insufficient documentation

## 2020-05-16 DIAGNOSIS — Z20822 Contact with and (suspected) exposure to covid-19: Secondary | ICD-10-CM | POA: Diagnosis not present

## 2020-05-16 DIAGNOSIS — E119 Type 2 diabetes mellitus without complications: Secondary | ICD-10-CM | POA: Diagnosis not present

## 2020-05-16 DIAGNOSIS — Z7982 Long term (current) use of aspirin: Secondary | ICD-10-CM | POA: Insufficient documentation

## 2020-05-16 DIAGNOSIS — E039 Hypothyroidism, unspecified: Secondary | ICD-10-CM | POA: Diagnosis not present

## 2020-05-16 DIAGNOSIS — Z79899 Other long term (current) drug therapy: Secondary | ICD-10-CM | POA: Diagnosis not present

## 2020-05-16 DIAGNOSIS — R002 Palpitations: Secondary | ICD-10-CM

## 2020-05-16 DIAGNOSIS — E1169 Type 2 diabetes mellitus with other specified complication: Secondary | ICD-10-CM

## 2020-05-16 DIAGNOSIS — Z794 Long term (current) use of insulin: Secondary | ICD-10-CM

## 2020-05-16 HISTORY — DX: Anxiety disorder, unspecified: F41.9

## 2020-05-16 LAB — BASIC METABOLIC PANEL
Anion gap: 11 (ref 5–15)
BUN: 13 mg/dL (ref 8–23)
CO2: 24 mmol/L (ref 22–32)
Calcium: 9.8 mg/dL (ref 8.9–10.3)
Chloride: 102 mmol/L (ref 98–111)
Creatinine, Ser: 0.69 mg/dL (ref 0.44–1.00)
GFR, Estimated: >60 mL/min (ref >60)
Glucose, Bld: 129 mg/dL — ABNORMAL HIGH (ref 70–99)
Potassium: 3.5 mmol/L (ref 3.5–5.1)
Sodium: 137 mmol/L (ref 135–145)

## 2020-05-16 LAB — CBC
HCT: 40.6 % (ref 36.0–46.0)
Hemoglobin: 13.2 g/dL (ref 12.0–15.0)
MCH: 28.7 pg (ref 26.0–34.0)
MCHC: 32.5 g/dL (ref 30.0–36.0)
MCV: 88.3 fL (ref 80.0–100.0)
Platelets: 169 10*3/uL (ref 150–400)
RBC: 4.6 MIL/uL (ref 3.87–5.11)
RDW: 13.5 % (ref 11.5–15.5)
WBC: 8 10*3/uL (ref 4.0–10.5)
nRBC: 0 % (ref 0.0–0.2)

## 2020-05-16 LAB — TROPONIN I (HIGH SENSITIVITY)
Troponin I (High Sensitivity): 16 ng/L (ref <18)
Troponin I (High Sensitivity): 20 ng/L — ABNORMAL HIGH (ref <18)

## 2020-05-16 LAB — TSH: TSH: 1.772 u[IU]/mL (ref 0.350–4.500)

## 2020-05-16 LAB — RESP PANEL BY RT-PCR (FLU A&B, COVID) ARPGX2
Influenza A by PCR: NEGATIVE
Influenza B by PCR: NEGATIVE
SARS Coronavirus 2 by RT PCR: NEGATIVE

## 2020-05-16 LAB — GLUCOSE, CAPILLARY: Glucose-Capillary: 157 mg/dL — ABNORMAL HIGH (ref 70–99)

## 2020-05-16 MED ORDER — AMLODIPINE BESYLATE 5 MG PO TABS
10.0000 mg | ORAL_TABLET | Freq: Every evening | ORAL | Status: DC
  Administered 2020-05-16: 10 mg via ORAL
  Filled 2020-05-16: qty 2

## 2020-05-16 MED ORDER — TRAZODONE HCL 50 MG PO TABS: 50.0000 mg | ORAL_TABLET | Freq: Every evening | ORAL | Status: DC | PRN

## 2020-05-16 MED ORDER — OMEGA-3-ACID ETHYL ESTERS 1 G PO CAPS
2.0000 g | ORAL_CAPSULE | Freq: Two times a day (BID) | ORAL | Status: DC
  Administered 2020-05-16: 2 g via ORAL
  Filled 2020-05-16 (×2): qty 2

## 2020-05-16 MED ORDER — SODIUM CHLORIDE 0.9% FLUSH: 3.0000 mL | Freq: Two times a day (BID) | INTRAVENOUS | Status: DC

## 2020-05-16 MED ORDER — INSULIN GLARGINE 100 UNIT/ML ~~LOC~~ SOLN
40.0000 [IU] | Freq: Every day | SUBCUTANEOUS | Status: DC
  Administered 2020-05-16: 40 [IU] via SUBCUTANEOUS
  Filled 2020-05-16 (×2): qty 0.4

## 2020-05-16 MED ORDER — HEPARIN SODIUM (PORCINE) 5000 UNIT/ML IJ SOLN
5000.0000 [IU] | Freq: Three times a day (TID) | INTRAMUSCULAR | Status: DC
  Administered 2020-05-16 – 2020-05-17 (×3): 5000 [IU] via SUBCUTANEOUS
  Filled 2020-05-16 (×2): qty 1

## 2020-05-16 MED ORDER — ASPIRIN EC 81 MG PO TBEC
81.0000 mg | DELAYED_RELEASE_TABLET | Freq: Every day | ORAL | Status: DC
  Administered 2020-05-16 – 2020-05-17 (×2): 81 mg via ORAL
  Filled 2020-05-16 (×2): qty 1

## 2020-05-16 MED ORDER — INSULIN ASPART 100 UNIT/ML ~~LOC~~ SOLN: 0.0000 [IU] | Freq: Every day | SUBCUTANEOUS | Status: DC

## 2020-05-16 MED ORDER — ACETAMINOPHEN 650 MG RE SUPP: 650.0000 mg | Freq: Four times a day (QID) | RECTAL | Status: DC | PRN

## 2020-05-16 MED ORDER — ASCORBIC ACID 500 MG PO TABS
1000.0000 mg | ORAL_TABLET | Freq: Every day | ORAL | Status: DC
  Administered 2020-05-16: 1000 mg via ORAL
  Filled 2020-05-16 (×2): qty 2

## 2020-05-16 MED ORDER — SODIUM CHLORIDE 0.9% FLUSH
3.0000 mL | Freq: Two times a day (BID) | INTRAVENOUS | Status: DC
  Administered 2020-05-16: 3 mL via INTRAVENOUS

## 2020-05-16 MED ORDER — ATORVASTATIN CALCIUM 20 MG PO TABS
20.0000 mg | ORAL_TABLET | Freq: Every evening | ORAL | Status: DC
  Administered 2020-05-16: 20 mg via ORAL
  Filled 2020-05-16: qty 1

## 2020-05-16 MED ORDER — SODIUM CHLORIDE 0.9 % IV SOLN: 250.0000 mL | INTRAVENOUS | Status: DC | PRN

## 2020-05-16 MED ORDER — ACETAMINOPHEN 325 MG PO TABS: 650.0000 mg | ORAL_TABLET | Freq: Four times a day (QID) | ORAL | Status: DC | PRN

## 2020-05-16 MED ORDER — BISACODYL 10 MG RE SUPP: 10.0000 mg | Freq: Every day | RECTAL | Status: DC | PRN

## 2020-05-16 MED ORDER — SODIUM CHLORIDE 0.9% FLUSH: 3.0000 mL | INTRAVENOUS | Status: DC | PRN

## 2020-05-16 MED ORDER — ONDANSETRON HCL 4 MG PO TABS: 4.0000 mg | ORAL_TABLET | Freq: Four times a day (QID) | ORAL | Status: DC | PRN

## 2020-05-16 MED ORDER — POLYETHYLENE GLYCOL 3350 17 G PO PACK: 17.0000 g | PACK | Freq: Every day | ORAL | Status: DC | PRN

## 2020-05-16 MED ORDER — COQ-10 200 MG PO CAPS: 200.0000 mg | ORAL_CAPSULE | Freq: Every day | ORAL | Status: DC

## 2020-05-16 MED ORDER — INSULIN ASPART 100 UNIT/ML ~~LOC~~ SOLN: 0.0000 [IU] | Freq: Three times a day (TID) | SUBCUTANEOUS | Status: DC

## 2020-05-16 MED ORDER — LEVOTHYROXINE SODIUM 75 MCG PO TABS
75.0000 ug | ORAL_TABLET | Freq: Every day | ORAL | Status: DC
  Administered 2020-05-17: 75 ug via ORAL
  Filled 2020-05-16: qty 1

## 2020-05-16 MED ORDER — VITAMIN D 25 MCG (1000 UNIT) PO TABS
1000.0000 [IU] | ORAL_TABLET | Freq: Every day | ORAL | Status: DC
  Administered 2020-05-16: 1000 [IU] via ORAL
  Filled 2020-05-16 (×2): qty 1

## 2020-05-16 MED ORDER — METOPROLOL TARTRATE 25 MG PO TABS
25.0000 mg | ORAL_TABLET | Freq: Once | ORAL | Status: AC
  Administered 2020-05-16: 25 mg via ORAL
  Filled 2020-05-16: qty 1

## 2020-05-16 MED ORDER — ONDANSETRON HCL 4 MG/2ML IJ SOLN: 4.0000 mg | Freq: Four times a day (QID) | INTRAMUSCULAR | Status: DC | PRN

## 2020-05-16 NOTE — ED Notes (Signed)
ekg handed to Dr. Rogene Houston

## 2020-05-16 NOTE — ED Triage Notes (Signed)
Sent by PCP today, c/o central chest pain that does not radiate. Pressure feeling, denies SOB, blurry vision, or diaphoresis. Slight headache. Took 4 baby ASA. Hx of anxiety.

## 2020-05-16 NOTE — ED Provider Notes (Signed)
[facility] EMERGENCY DEPARTMENT Provider Note   CSN: [id number] Arrival date & time: 05/16/20  1039     History Chief Complaint  Patient presents with  . Chest Pain    Megan Schroeder is a 74 y.o. female.  Patient with onset of substernal chest pain at approximately 8 AM this morning.  Patient at the instruction of EMS took aspirin at 9 in the morning.  Which improved her pain but did not take it completely away.  Associated with palpitations and nausea but no vomiting no shortness of breath.  Patient had cardiac catheterization back in 2013 which showed a very distal LAD 70% lesion that they could not stent.  Otherwise no other significant lesions.  Patient has significant risk factors for cardiac disease to include diabetes, hyperlipidemia, hypertension.        Past Medical History:  Diagnosis Date  . Anxiety   . Diabetes mellitus   . Hyperlipidemia   . Hypertension   . Thyroid disease     Patient Active Problem List   Diagnosis Date Noted  . Facial cellulitis 12/24/2015  . Hypothyroidism 12/24/2015  . History of colonic polyps   . Diverticulosis of colon without hemorrhage   . Headache(784.0) 07/22/2011  . Chest pain 07/21/2011  . Hypertension 07/21/2011  . Diabetes mellitus ([place]) 07/21/2011  . Hyperlipidemia 07/21/2011    Past Surgical History:  Procedure Laterality Date  . BREAST EXCISIONAL BIOPSY Left   . CATARACT EXTRACTION W/PHACO Left 12/16/2017   Procedure: CATARACT EXTRACTION PHACO AND INTRAOCULAR LENS PLACEMENT (IOC);  Surgeon: [name], MD;  Location: [facility];  Service: Ophthalmology;  Laterality: Left;  CDE: 11.65  . CATARACT EXTRACTION W/PHACO Right 12/30/2017   Procedure: CATARACT EXTRACTION PHACO AND INTRAOCULAR LENS PLACEMENT RIGHT EYE;  Surgeon: [name], MD;  Location: [facility];  Service: Ophthalmology;  Laterality: Right;  CDE: 9.07  . COLONOSCOPY N/A 09/15/2015   Dr. Dudley Major multiple rectal and colonic polyps removed. Hepatic flexure with 9 mm  polyp, multiple 5-7 mm polyps. Multiple cecal polyps with largest 1.5 cm, one 5 mm polyp in rectum. Path for colonic polyps with sessile serrated polyp without dysplasia, no high grade dysplasia, tubular adenomas, rectal hyperplastic polyp  . COLONOSCOPY WITH PROPOFOL N/A 07/20/2019   Procedure: COLONOSCOPY WITH PROPOFOL;  Surgeon: [name], MD;  Location: [facility];  Service: Endoscopy;  Laterality: N/A;  8:30am  . LEFT HEART CATHETERIZATION WITH CORONARY ANGIOGRAM N/A 07/23/2011   Procedure: LEFT HEART CATHETERIZATION WITH CORONARY ANGIOGRAM;  Surgeon: [name], MD;  Location: [facility] CATH LAB;  Service: Cardiovascular;  Laterality: N/A;  . POLYPECTOMY  07/20/2019   Procedure: POLYPECTOMY;  Surgeon: [name], MD;  Location: [facility];  Service: Endoscopy;;     OB History    Gravida  1   Para  1   Term  1   Preterm      AB      Living  1     SAB      TAB      Ectopic      Multiple      Live Births              Family History  Problem Relation Age of Onset  . Cancer Father   . Diabetes Father   . Colon cancer Neg Hx     Social History   Tobacco Use  . Smoking status: Never Smoker  . Smokeless tobacco: Never Used  Substance  Use Topics  . Alcohol use: No  . Drug use: No    Home Medications Prior to Admission medications   Medication Sig Start Date End Date Taking? Authorizing Provider  amLODipine (NORVASC) 10 MG tablet Take 10 mg by mouth every evening.     [provider]  Ascorbic Acid (VITAMIN C) 1000 MG tablet Take 1,000 mg by mouth daily.    [provider]  aspirin EC 81 MG tablet Take 81 mg by mouth at bedtime.     [provider]  atorvastatin (LIPITOR) 20 MG tablet Take 20 mg by mouth every evening.     [provider]  cholecalciferol (VITAMIN D) 1000 UNITS tablet Take 1,000 Units by mouth daily.     [provider]  Coenzyme Q10 (COQ-10) 200 MG CAPS Take 200 mg by mouth daily.      [provider]  dapagliflozin propanediol (FARXIGA) 10 MG TABS tablet Take 10 mg by mouth daily.    [provider]  levothyroxine (SYNTHROID, LEVOTHROID) 75 MCG tablet Take 75 mcg by mouth daily before breakfast.     [provider]  Liraglutide (VICTOZA) 18 MG/3ML SOPN Inject 1.2 mg into the skin daily.     [provider]  metFORMIN (GLUCOPHAGE) 500 MG tablet Take 1 tablet (500 mg total) by mouth 2 (two) times daily with a meal. Patient taking differently: Take 500 mg by mouth 2 (two) times daily.  07/24/11   Hongalgi, Lenis Dickinson, MD  olmesartan-hydrochlorothiazide (BENICAR HCT) 40-12.5 MG tablet Take 1 tablet by mouth daily.    [provider]  Omega-3 Fatty Acids (FISH OIL) 1200 MG CAPS Take 1,200 mg by mouth every other day.     [provider]  polyethylene glycol-electrolytes (TRILYTE) 420 g solution Take 4,000 mLs by mouth as directed. 04/21/19   Rourk, Cristopher Estimable, MD  TOUJEO SOLOSTAR 300 UNIT/ML SOPN Inject 50 Units into the skin at bedtime.  06/17/19   [provider]    Allergies    Penicillins and Hydrocodone  Review of Systems   Review of Systems  Constitutional: Negative for chills and fever.  HENT: Negative for congestion, rhinorrhea and sore throat.   Eyes: Negative for visual disturbance.  Respiratory: Negative for cough and shortness of breath.   Cardiovascular: Positive for chest pain and palpitations. Negative for leg swelling.  Gastrointestinal: Negative for abdominal pain, diarrhea, nausea and vomiting.  Genitourinary: Negative for dysuria.  Musculoskeletal: Negative for back pain and neck pain.  Skin: Negative for rash.  Neurological: Negative for dizziness, light-headedness and headaches.  Hematological: Does not bruise/bleed easily.  Psychiatric/Behavioral: Negative for confusion.    Physical Exam Updated Vital Signs BP 129/61   Pulse 69   Temp 97.9 F (36.6 C) (Oral)   Resp 13   Ht 1.575 m (5\' 2" )    Wt 65.8 kg   SpO2 100%   BMI 26.52 kg/m   Physical Exam Vitals and nursing note reviewed.  Constitutional:      General: She is not in acute distress.    Appearance: Normal appearance. She is well-developed.  HENT:     Head: Normocephalic and atraumatic.  Eyes:     Extraocular Movements: Extraocular movements intact.     Conjunctiva/sclera: Conjunctivae normal.     Pupils: Pupils are equal, round, and reactive to light.  Cardiovascular:     Rate and Rhythm: Normal rate and regular rhythm.     Heart sounds: No murmur heard.   Pulmonary:  Effort: Pulmonary effort is normal. No respiratory distress.     Breath sounds: Normal breath sounds.  Chest:     Chest wall: No tenderness.  Abdominal:     Palpations: Abdomen is soft.     Tenderness: There is no abdominal tenderness.  Musculoskeletal:        General: No swelling. Normal range of motion.     Cervical back: Normal range of motion and neck supple.  Skin:    General: Skin is warm and dry.     Capillary Refill: Capillary refill takes less than 2 seconds.  Neurological:     General: No focal deficit present.     Mental Status: She is alert and oriented to person, place, and time.     Cranial Nerves: No cranial nerve deficit.     Sensory: No sensory deficit.     Motor: No weakness.     ED Results / Procedures / Treatments   Labs (all labs ordered are listed, but only abnormal results are displayed) Labs Reviewed  BASIC METABOLIC PANEL - Abnormal; Notable for the following components:      Result Value   Glucose, Bld 129 (*)    All other components within normal limits  RESP PANEL BY RT-PCR (FLU A&B, COVID) ARPGX2  CBC  TROPONIN I (HIGH SENSITIVITY)  TROPONIN I (HIGH SENSITIVITY)    EKG EKG Interpretation  Date/Time:  Monday May 16 2020 10:41:57 EST Ventricular Rate:  75 PR Interval:  178 QRS Duration: 76 QT Interval:  372 QTC Calculation: 415 R Axis:   58 Text Interpretation: Normal sinus rhythm  Normal ECG Confirmed by Fredia Sorrow [phone]) on 05/16/2020 11:21:06 AM   Radiology DG Chest 2 View  Result Date: 05/16/2020 CLINICAL DATA:  Chest pain EXAM: CHEST - 2 VIEW COMPARISON:  07/29/2012 FINDINGS: The heart size and mediastinal contours are within normal limits. No focal airspace consolidation, pleural effusion, or pneumothorax. No acute osseous findings. Evidence of calcific tendinopathy of the left rotator cuff. IMPRESSION: No active cardiopulmonary disease. Electronically Signed   By: Davina Poke D.O.   On: 05/16/2020 11:25    Procedures Procedures (including critical care time)  Medications Ordered in ED Medications - No data to display  ED Course  I have reviewed the triage vital signs and the nursing notes.  Pertinent labs & imaging results that were available during my care of the patient were reviewed by me and considered in my medical decision making (see chart for details).    MDM Rules/Calculators/A&P                          Patient with significant cardiac risk factors.  Patient's chest pain eventually did completely resolve around noontime today.  First troponin normal at 16 EKG without acute findings chest x-ray negative.  Covid testing negative labs without significant abnormalities.  Blood glucose fairly well controlled here 129.  Patient's pain improved with baby aspirin.  But did not completely resolve.  But then went on to resolve on its own.  Second troponin is pending.  Will discuss with hospitalist about cardiac admission.  Patient had cardiac catheterization in 2013 that did have some significant distal LAD lesion.     Final Clinical Impression(s) / ED Diagnoses Final diagnoses:  Precordial pain  Palpitations    Rx / DC Orders ED Discharge Orders    None       Fredia Sorrow, MD 05/16/20 1429

## 2020-05-16 NOTE — H&P (Addendum)
Patient Demographics:    Megan Schroeder, is a 74 y.o. female  MRN: [id number]   DOB - 1946-01-07  Admit Date - 05/16/2020  Outpatient Primary MD for the patient is Lucia Gaskins, MD   Assessment & Plan:    Principal Problem:   Chest pain Active Problems:   CAD (coronary artery disease)-LHC 07/2011    Hypertension   Diabetes mellitus ([place])   Hypothyroidism   1)Atypical Chest Pain-  Cardiovascular risk factors include DM2, HTN, HLD, and  history of known CAD-- LHC in 07/2011 with Distal LAD stenosis-70 %   Place in Observation status on  telemetry monitored unit, check serial troponins and EKG to rule out acute coronary syndrome .  If patient rules out for ACS, will need further cardiovascular risk stratification . Cardiology consult to help decide if Stress test is needed in am Versus other diagnostic modalities.   Give aspirin, nitroglycerin,  --Give 1 dose of metoprolol now and then hold off on additional betablocker in case exercise stress test considered --Given palpitations will check TSH , watch for arrhythmias on telemetry -Echo pending Prior LHC from 2013 as in HPI/subjective section C/n aspirin and Lipitor  -N.p.o. after midnight  2)DM2-A1c previously around 10, history of uncontrolled DM, -Decrease Lantus insulin to 40 units daily--as patient will be n.p.o. for possible stress test versus LHC in a.m. -Hold Farxiga, hold Metformin, hold Trulicity Use Novolog/Humalog Sliding scale insulin with Accu-Cheks/Fingersticks as ordered   3)Hypothyroidism- c/n Levothyroxine, check TSH  4)HTN-stable, continue amlodipine 10 mg hold olmesartan HCTZ, use level labetalol as needed elevated BP  Disposition/Need for in-Hospital Stay- patient unable to be discharged at this time due to --- chest pains and  palpitations in a patient with history of CAD and multiple cardiovascular risk factors requiring further investigations and telemetry monitoring  Dispo: The patient is from: Home              Anticipated d/c is to: Home              Anticipated d/c date is: 1 day              Patient currently is not medically stable to d/c. Barriers: Not Clinically Stable-    With History of - Reviewed by me  Past Medical History:  Diagnosis Date  . Anxiety   . Diabetes mellitus   . Hyperlipidemia   . Hypertension   . Thyroid disease       Past Surgical History:  Procedure Laterality Date  . BREAST EXCISIONAL BIOPSY Left   . CATARACT EXTRACTION W/PHACO Left 12/16/2017   Procedure: CATARACT EXTRACTION PHACO AND INTRAOCULAR LENS PLACEMENT (IOC);  Surgeon: [name], MD;  Location: [facility];  Service: Ophthalmology;  Laterality: Left;  CDE: 11.65  . CATARACT EXTRACTION W/PHACO Right 12/30/2017   Procedure: CATARACT EXTRACTION PHACO AND INTRAOCULAR LENS PLACEMENT RIGHT EYE;  Surgeon: [name], MD;  Location: [facility];  Service: Ophthalmology;  Laterality: Right;  CDE: 9.07  . COLONOSCOPY N/A 09/15/2015   Dr. Dudley Schroeder multiple rectal and colonic polyps removed. Hepatic flexure with 9 mm polyp, multiple 5-7 mm polyps. Multiple cecal polyps with largest 1.5 cm, one 5 mm polyp in rectum. Path for colonic polyps with sessile serrated polyp without dysplasia, no high grade dysplasia, tubular adenomas, rectal hyperplastic polyp  . COLONOSCOPY WITH PROPOFOL N/A 07/20/2019   Procedure: COLONOSCOPY WITH PROPOFOL;  Surgeon: [name], MD;  Location: [facility];  Service: Endoscopy;  Laterality: N/A;  8:30am  . LEFT HEART CATHETERIZATION WITH CORONARY ANGIOGRAM N/A 07/23/2011   Procedure: LEFT HEART CATHETERIZATION WITH CORONARY ANGIOGRAM;  Surgeon: [name], MD;  Location[facility] CATH LAB;  Service: Cardiovascular;  Laterality: N/A;  . POLYPECTOMY  07/20/2019   Procedure: POLYPECTOMY;  Surgeon: [name],  MD;  Location: [facility];  Service: Endoscopy;;    Chief Complaint  Patient presents with  . Chest Pain      HPI:    Megan Schroeder  is a 74 y.o. female  With PMHx relevant for HTN, CAD (LHC in 07/2011 with Distal LAD stenosis-70 %), DM2, HLD and hypothyroidism who woke up around 8 AM with substernal chest discomfort associated with nausea, palpitations and dizziness, patient called EMS and was instructed to take aspirin--- chest pain improved but did not resolve -Chest pain did not really radiate anywhere, no diaphoresis, patient is having difficulty describing the character of the chest pain, additional history obtained from patient's husband at bedside, no emesis, no cough or fevers -Denies shortness of breath or pleuritic symptoms -She had hip and leg pains over the last couple days but not today -No significant leg swelling -Patient said what bothered her the most was actually the palpitations -She is Vaccinated against COVID-19 infection -Covid Test Neg Troponin--- 16>>>20 Creatinine 0.69, CXR w/o acute , EKG without acute ST changes  LHC 07/2011 LAD:  30% proximal and mid.  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  Vessel size only about 72mm             D1: normal             D2: normal Circumflex:  Normal             OM1: normal    RCA: Dominant and normal Ventriculography: EF: 60 %, no RWMA;s Hemodynamics:             Aortic Pressure: 127/60  mmHg             LV Pressure: 127/ 10  mmHg Impression:  Films reviewed with Dr Angelena Form Both agree that distal LAD too small to intervene on.     Review of systems:    In addition to the HPI above,   A full Review of  Systems was done, all other systems reviewed are negative except as noted above in HPI , .    Social History:  Reviewed by me   Social History   Tobacco Use  . Smoking status: Never Smoker  . Smokeless tobacco: Never Used  Substance Use Topics  . Alcohol use: No     Family History :    Reviewed by me    Family History  Problem Relation Age of Onset  . Cancer Father   . Diabetes Father   . Colon cancer Neg Hx      Home Medications:   Prior to Admission medications   Medication Sig Start Date  End Date Taking? Authorizing Provider  amLODipine (NORVASC) 10 MG tablet Take 10 mg by mouth every evening.    Yes [provider]  Ascorbic Acid (VITAMIN C) 1000 MG tablet Take 1,000 mg by mouth daily.   Yes [provider]  aspirin EC 81 MG tablet Take 81 mg by mouth at bedtime.    Yes [provider]  atorvastatin (LIPITOR) 20 MG tablet Take 20 mg by mouth every evening.    Yes [provider]  cholecalciferol (VITAMIN D) 1000 UNITS tablet Take 1,000 Units by mouth daily.    Yes [provider]  Coenzyme Q10 (COQ-10) 200 MG CAPS Take 200 mg by mouth daily.    Yes [provider]  dapagliflozin propanediol (FARXIGA) 10 MG TABS tablet Take 10 mg by mouth daily.   Yes [provider]  levothyroxine (SYNTHROID, LEVOTHROID) 75 MCG tablet Take 75 mcg by mouth daily before breakfast.    Yes [provider]  metFORMIN (GLUCOPHAGE) 500 MG tablet Take 1 tablet (500 mg total) by mouth 2 (two) times daily with a meal. Patient taking differently: Take 500 mg by mouth daily.  07/24/11  Yes Hongalgi, Lenis Dickinson, MD  Omega-3 Fatty Acids (FISH OIL) 1200 MG CAPS Take 1,200 mg by mouth every other day.    Yes [provider]  TOUJEO SOLOSTAR 300 UNIT/ML SOPN Inject 50 Units into the skin at bedtime.  06/17/19  Yes [provider]  TRULICITY 6.16 WV/3.7TG SOPN Inject 0.75 mg into the skin once a week. 05/10/20  Yes [provider]  olmesartan-hydrochlorothiazide (BENICAR HCT) 40-12.5 MG tablet Take 1 tablet by mouth daily.    [provider]  polyethylene glycol-electrolytes (TRILYTE) 420 g solution Take 4,000 mLs by mouth as directed. Patient not taking: Reported on 05/16/2020 04/21/19   [name], MD     Allergies:     Allergies  Allergen Reactions  . Penicillins Hives    Has patient had a PCN reaction causing immediate rash, facial/tongue/throat swelling, SOB or lightheadedness with hypotension: No Has patient had a PCN reaction causing severe rash involving mucus membranes or skin necrosis: Yes Has patient had a PCN reaction that required hospitalization: No Has patient had a PCN reaction occurring within the last 10 years: No If all of the above answers are "NO", then may proceed with Cephalosporin use.    Marland Kitchen Hydrocodone Nausea And Vomiting     Physical Exam:   Vitals  Blood pressure 139/60, pulse 73, temperature 98.1 F (36.7 C), temperature source Oral, resp. rate 19, height 5\' 2"  (1.575 m), weight 65.8 kg, SpO2 100 %.  Physical Examination: General appearance - alert,   and in no distress  Mental status - alert, oriented to person, place, and time,  Eyes - sclera anicteric Neck - supple, no JVD elevation , Chest - clear  to auscultation bilaterally, symmetrical air movement,  Heart - S1 and S2 normal, regular  Abdomen - soft, nontender, nondistended, no masses or organomegaly Neurological - screening mental status exam normal, neck supple without rigidity, cranial nerves II through XII intact, DTR's normal and symmetric Extremities - no pedal edema noted, intact peripheral pulses  Skin - warm, dry     Data Review:    CBC Recent Labs  Lab 05/16/20 1049  WBC 8.0  HGB 13.2  HCT 40.6  PLT 169  MCV 88.3  MCH 28.7  MCHC 32.5  RDW 13.5   ------------------------------------------------------------------------------------------------------------------  Chemistries  Recent Labs  Lab 05/16/20 1049  NA 137  K 3.5  CL 102  CO2 24  GLUCOSE 129*  BUN 13  CREATININE 0.69  CALCIUM 9.8   ------------------------------------------------------------------------------------------------------------------ estimated creatinine clearance is 56.6  mL/min (by C-G formula based on SCr of 0.69 mg/dL). ------------------------------------------------------------------------------------------------------------------ No results for input(s): TSH, T4TOTAL, T3FREE, THYROIDAB in the last 72 hours.  Invalid input(s): FREET3   Coagulation profile No results for input(s): INR, PROTIME in the last 168 hours. ------------------------------------------------------------------------------------------------------------------- No results for input(s): DDIMER in the last 72 hours. -------------------------------------------------------------------------------------------------------------------  Cardiac Enzymes No results for input(s): CKMB, TROPONINI, MYOGLOBIN in the last 168 hours.  Invalid input(s): CK ------------------------------------------------------------------------------------------------------------------ No results found for: BNP   ---------------------------------------------------------------------------------------------------------------  Urinalysis    Component Value Date/Time   COLORURINE YELLOW 12/24/2015 0822   APPEARANCEUR CLEAR 12/24/2015 0822   LABSPEC <1.005 (L) 12/24/2015 0822   PHURINE 6.0 12/24/2015 0822   GLUCOSEU >1000 (A) 12/24/2015 0822   HGBUR TRACE (A) 12/24/2015 0822   BILIRUBINUR NEGATIVE 12/24/2015 0822   KETONESUR NEGATIVE 12/24/2015 0822   PROTEINUR NEGATIVE 12/24/2015 0822   UROBILINOGEN 4.0 (H) 10/01/2013 2025   NITRITE NEGATIVE 12/24/2015 0822   LEUKOCYTESUR SMALL (A) 12/24/2015 0822    ----------------------------------------------------------------------------------------------------------------   Imaging Results:    DG Chest 2 View  Result Date: 05/16/2020 CLINICAL DATA:  Chest pain EXAM: CHEST - 2 VIEW COMPARISON:  07/29/2012 FINDINGS: The heart size and mediastinal contours are within normal limits. No focal airspace consolidation, pleural effusion, or pneumothorax. No acute osseous  findings. Evidence of calcific tendinopathy of the left rotator cuff. IMPRESSION: No active cardiopulmonary disease. Electronically Signed   By: Davina Poke D.O.   On: 05/16/2020 11:25    Radiological Exams on Admission: DG Chest 2 View  Result Date: 05/16/2020 CLINICAL DATA:  Chest pain EXAM: CHEST - 2 VIEW COMPARISON:  07/29/2012 FINDINGS: The heart size and mediastinal contours are within normal limits. No focal airspace consolidation, pleural effusion, or pneumothorax. No acute osseous findings. Evidence of calcific tendinopathy of the left rotator cuff. IMPRESSION: No active cardiopulmonary disease. Electronically Signed   By: Davina Poke D.O.   On: 05/16/2020 11:25    DVT Prophylaxis -SCD /heparin AM Labs Ordered, also please review Full Orders  Family Communication: Admission, patients condition and plan of care including tests being ordered have been discussed with the patient and husband  who indicate understanding and agree with the plan   Code Status - Full Code  Likely DC to home after work-up for chest pains and palpitations  Condition   stable  Roxan Hockey M.D on 05/16/2020 at 4:21 PM Go to [email or web address] -  for contact info  [facility] - Office  [phone]

## 2020-05-17 ENCOUNTER — Telehealth: Payer: Self-pay

## 2020-05-17 ENCOUNTER — Observation Stay (HOSPITAL_BASED_OUTPATIENT_CLINIC_OR_DEPARTMENT_OTHER): Payer: Medicare Other

## 2020-05-17 DIAGNOSIS — R072 Precordial pain: Secondary | ICD-10-CM

## 2020-05-17 DIAGNOSIS — E1169 Type 2 diabetes mellitus with other specified complication: Secondary | ICD-10-CM

## 2020-05-17 DIAGNOSIS — E039 Hypothyroidism, unspecified: Secondary | ICD-10-CM | POA: Diagnosis not present

## 2020-05-17 DIAGNOSIS — R079 Chest pain, unspecified: Secondary | ICD-10-CM

## 2020-05-17 DIAGNOSIS — I10 Essential (primary) hypertension: Secondary | ICD-10-CM | POA: Diagnosis not present

## 2020-05-17 DIAGNOSIS — I25119 Atherosclerotic heart disease of native coronary artery with unspecified angina pectoris: Secondary | ICD-10-CM | POA: Diagnosis not present

## 2020-05-17 DIAGNOSIS — E785 Hyperlipidemia, unspecified: Secondary | ICD-10-CM | POA: Diagnosis not present

## 2020-05-17 DIAGNOSIS — I2510 Atherosclerotic heart disease of native coronary artery without angina pectoris: Secondary | ICD-10-CM | POA: Diagnosis not present

## 2020-05-17 LAB — CBC
HCT: 41.3 % (ref 36.0–46.0)
Hemoglobin: 13.2 g/dL (ref 12.0–15.0)
MCH: 28.6 pg (ref 26.0–34.0)
MCHC: 32 g/dL (ref 30.0–36.0)
MCV: 89.6 fL (ref 80.0–100.0)
Platelets: 181 10*3/uL (ref 150–400)
RBC: 4.61 MIL/uL (ref 3.87–5.11)
RDW: 13.4 % (ref 11.5–15.5)
WBC: 6.9 10*3/uL (ref 4.0–10.5)
nRBC: 0 % (ref 0.0–0.2)

## 2020-05-17 LAB — ECHOCARDIOGRAM COMPLETE
Area-P 1/2: 4.54 cm2
Height: 62 in
S' Lateral: 1.7 cm
Weight: 2320 oz

## 2020-05-17 LAB — BASIC METABOLIC PANEL
Anion gap: 10 (ref 5–15)
BUN: 15 mg/dL (ref 8–23)
CO2: 25 mmol/L (ref 22–32)
Calcium: 9.8 mg/dL (ref 8.9–10.3)
Chloride: 103 mmol/L (ref 98–111)
Creatinine, Ser: 0.63 mg/dL (ref 0.44–1.00)
GFR, Estimated: >60 mL/min (ref >60)
Glucose, Bld: 123 mg/dL — ABNORMAL HIGH (ref 70–99)
Potassium: 4 mmol/L (ref 3.5–5.1)
Sodium: 138 mmol/L (ref 135–145)

## 2020-05-17 LAB — HEMOGLOBIN A1C
Hgb A1c MFr Bld: 9.5 % — ABNORMAL HIGH (ref 4.8–5.6)
Mean Plasma Glucose: 225.95 mg/dL

## 2020-05-17 LAB — TROPONIN I (HIGH SENSITIVITY): Troponin I (High Sensitivity): 21 ng/L — ABNORMAL HIGH (ref <18)

## 2020-05-17 LAB — GLUCOSE, CAPILLARY
Glucose-Capillary: 115 mg/dL — ABNORMAL HIGH (ref 70–99)
Glucose-Capillary: 130 mg/dL — ABNORMAL HIGH (ref 70–99)

## 2020-05-17 MED ORDER — METFORMIN HCL 1000 MG PO TABS: 1000.0000 mg | ORAL_TABLET | Freq: Two times a day (BID) | ORAL | 3 refills | Status: DC

## 2020-05-17 MED ORDER — AMLODIPINE BESYLATE 10 MG PO TABS: 10.0000 mg | ORAL_TABLET | Freq: Every day | ORAL | 3 refills | Status: DC

## 2020-05-17 MED ORDER — ACETAMINOPHEN 325 MG PO TABS: 650.0000 mg | ORAL_TABLET | Freq: Four times a day (QID) | ORAL | 0 refills | Status: DC | PRN

## 2020-05-17 MED ORDER — DAPAGLIFLOZIN PROPANEDIOL 10 MG PO TABS: 10.0000 mg | ORAL_TABLET | Freq: Every day | ORAL | 3 refills | Status: AC

## 2020-05-17 MED ORDER — LEVOTHYROXINE SODIUM 75 MCG PO TABS: 75.0000 ug | ORAL_TABLET | Freq: Every day | ORAL | 3 refills | Status: AC

## 2020-05-17 MED ORDER — ASPIRIN EC 81 MG PO TBEC
81.0000 mg | DELAYED_RELEASE_TABLET | Freq: Every day | ORAL | 11 refills | Status: DC
Start: 2020-05-17 — End: 2023-06-26

## 2020-05-17 MED ORDER — ATORVASTATIN CALCIUM 20 MG PO TABS
20.0000 mg | ORAL_TABLET | Freq: Every evening | ORAL | 3 refills | Status: DC
Start: 2020-05-17 — End: 2022-11-29

## 2020-05-17 NOTE — Progress Notes (Signed)
*  PRELIMINARY RESULTS* Echocardiogram 2D Echocardiogram has been performed.  Leavy Cella 05/17/2020, 9:29 AM

## 2020-05-17 NOTE — Discharge Instructions (Signed)
1) your diabetes is out of control--- please increase your Metformin to 1000 mg twice daily and follow-up with y diabetic specialist/endocrinologist Dr. Loni Beckwith for improved diabetic control --- Endocrinologist Dr. Loni Beckwith, MD, Endocrinology, Diabetes & Metabolism-- [facility], [address] [place], [place] [address], Phone Number- [phone]  2) you will need stress test as advised by the cardiology team--- please come back for outpatient stress test according to the instructions given to you today by the cardiology team  3) please avoid strenuous activities until you have had a stress test and a been cleared by the cardiology team

## 2020-05-17 NOTE — Consult Note (Addendum)
Cardiology Consult    Patient ID: Megan Schroeder; [id number]; [birth year]   Admit date: 05/16/2020 Date of Consult: 05/17/2020  Primary Care Provider: Lucia Gaskins, MD Primary Cardiologist: Previously followed by Dr. Johnsie Schroeder in 2013 - No outpatient follow-up since  Patient Profile    Megan Schroeder is a 74 y.o. female with past medical history of CAD (known 70% distal LAD stenosis by cath in 2013 and too small for intervention), HTN, HLD, Type 2 DM and Hypothyroidism who is being seen today for the evaluation of chest pain at the request of Megan Schroeder.   History of Present Illness    Megan Schroeder reports being in her usual state of health until yesterday morning when she developed chest pressure while walking around her home. Reports associated nausea and palpitations at that time. Symptoms spontaneously resolved within 30 minutes. She contacted her PCP who recommended she proceed to the ED for further evaluation. She denies any episodes of chest pain or dyspnea on exertion prior to yesterday. Says that she is active at home and exercises routinely, typically walking a mile each day or doing water aerobics in her pool when weather permits. She denies any chest pain or dyspnea on exertion with these activities. She does report occasional palpitations when walking to the mailbox over the past few months. No recent orthopnea, PND or lower extremity edema.  Initial labs show WBC 8.0, Hgb 13.2, platelets 169, Na+ 137, K+ 3.5 and creatinine 0.69. TSH 1.772. Initial HS Troponin 16 with repeat of 20. COVID negative. Hgb A1c at 9.5. CXR with no active cardiopulmonary disease. EKG shows NSR, HR 75 with no acute ST abnormalities when compared to prior tracings.   She denies any recurrent chest pain or dyspnea overnight or this morning. Has been ambulating around the room without difficulty.    Past Medical History:  Diagnosis Date  . Anxiety   . Diabetes mellitus   . Hyperlipidemia   .  Hypertension   . Thyroid disease     Past Surgical History:  Procedure Laterality Date  . BREAST EXCISIONAL BIOPSY Left   . CATARACT EXTRACTION W/PHACO Left 12/16/2017   Procedure: CATARACT EXTRACTION PHACO AND INTRAOCULAR LENS PLACEMENT (IOC);  Surgeon: Megan Branch, MD;  Location: [facility];  Service: Ophthalmology;  Laterality: Left;  CDE: 11.65  . CATARACT EXTRACTION W/PHACO Right 12/30/2017   Procedure: CATARACT EXTRACTION PHACO AND INTRAOCULAR LENS PLACEMENT RIGHT EYE;  Surgeon: Megan Branch, MD;  Location: [facility];  Service: Ophthalmology;  Laterality: Right;  CDE: 9.07  . COLONOSCOPY N/A 09/15/2015   Dr. Dudley Schroeder multiple rectal and colonic polyps removed. Hepatic flexure with 9 mm polyp, multiple 5-7 mm polyps. Multiple cecal polyps with largest 1.5 cm, one 5 mm polyp in rectum. Path for colonic polyps with sessile serrated polyp without dysplasia, no high grade dysplasia, tubular adenomas, rectal hyperplastic polyp  . COLONOSCOPY WITH PROPOFOL N/A 07/20/2019   Procedure: COLONOSCOPY WITH PROPOFOL;  Surgeon: Megan Dolin, MD;  Location: [facility];  Service: Endoscopy;  Laterality: N/A;  8:30am  . LEFT HEART CATHETERIZATION WITH CORONARY ANGIOGRAM N/A 07/23/2011   Procedure: LEFT HEART CATHETERIZATION WITH CORONARY ANGIOGRAM;  Surgeon: Megan Hector, MD;  Location: [facility] CATH LAB;  Service: Cardiovascular;  Laterality: N/A;  . POLYPECTOMY  07/20/2019   Procedure: POLYPECTOMY;  Surgeon: Megan Dolin, MD;  Location: [facility];  Service: Endoscopy;;     Home Medications:  Prior to Admission medications   Medication Sig Start Date  End Date Taking? Authorizing Provider  amLODipine (NORVASC) 10 MG tablet Take 10 mg by mouth every evening.    Yes [provider]  Ascorbic Acid (VITAMIN C) 1000 MG tablet Take 1,000 mg by mouth daily.   Yes [provider]  aspirin EC 81 MG tablet Take 81 mg by mouth at bedtime.    Yes [provider]  atorvastatin (LIPITOR) 20 MG tablet  Take 20 mg by mouth every evening.    Yes [provider]  cholecalciferol (VITAMIN D) 1000 UNITS tablet Take 1,000 Units by mouth daily.    Yes [provider]  Coenzyme Q10 (COQ-10) 200 MG CAPS Take 200 mg by mouth daily.    Yes [provider]  dapagliflozin propanediol (FARXIGA) 10 MG TABS tablet Take 10 mg by mouth daily.   Yes [provider]  levothyroxine (SYNTHROID, LEVOTHROID) 75 MCG tablet Take 75 mcg by mouth daily before breakfast.    Yes [provider]  metFORMIN (GLUCOPHAGE) 500 MG tablet Take 1 tablet (500 mg total) by mouth 2 (two) times daily with a meal. Patient taking differently: Take 500 mg by mouth daily.  07/24/11  Yes Hongalgi, Lenis Dickinson, MD  Omega-3 Fatty Acids (FISH OIL) 1200 MG CAPS Take 1,200 mg by mouth every other day.    Yes [provider]  TOUJEO SOLOSTAR 300 UNIT/ML SOPN Inject 50 Units into the skin at bedtime.  06/17/19  Yes [provider]  TRULICITY 5.57 DU/2.0UR SOPN Inject 0.75 mg into the skin once a week. 05/10/20  Yes [provider]  olmesartan-hydrochlorothiazide (BENICAR HCT) 40-12.5 MG tablet Take 1 tablet by mouth daily.    [provider]  polyethylene glycol-electrolytes (TRILYTE) 420 g solution Take 4,000 mLs by mouth as directed. Patient not taking: Reported on 05/16/2020 04/21/19   Megan Dolin, MD    Inpatient Medications: Scheduled Meds: . amLODipine  10 mg Oral QPM  . vitamin C  1,000 mg Oral Daily  . aspirin EC  81 mg Oral Q breakfast  . atorvastatin  20 mg Oral QPM  . cholecalciferol  1,000 Units Oral Daily  . heparin  5,000 Units Subcutaneous Q8H  . insulin aspart  0-5 Units Subcutaneous QHS  . insulin aspart  0-6 Units Subcutaneous TID WC  . insulin glargine  40 Units Subcutaneous QHS  . levothyroxine  75 mcg Oral QAC breakfast  . omega-3 acid ethyl esters  2 g Oral BID  . sodium chloride flush  3 mL Intravenous Q12H  . sodium chloride flush  3 mL  Intravenous Q12H   Continuous Infusions: . sodium chloride     PRN Meds: sodium chloride, acetaminophen **OR** acetaminophen, bisacodyl, ondansetron **OR** ondansetron (ZOFRAN) IV, polyethylene glycol, sodium chloride flush, traZODone  Allergies:    Allergies  Allergen Reactions  . Penicillins Hives    Has patient had a PCN reaction causing immediate rash, facial/tongue/throat swelling, SOB or lightheadedness with hypotension: No Has patient had a PCN reaction causing severe rash involving mucus membranes or skin necrosis: Yes Has patient had a PCN reaction that required hospitalization: No Has patient had a PCN reaction occurring within the last 10 years: No If all of the above answers are "NO", then may proceed with Cephalosporin use.    Marland Kitchen Hydrocodone Nausea And Vomiting    Social History:   Social History   Socioeconomic History  . Marital status: Widowed    Spouse name: Not on file  . Number of children: Not on  file  . Years of education: Not on file  . Highest education level: Not on file  Occupational History  . Not on file  Tobacco Use  . Smoking status: Never Smoker  . Smokeless tobacco: Never Used  Substance and Sexual Activity  . Alcohol use: No  . Drug use: No  . Sexual activity: Yes    Birth control/protection: Post-menopausal  Other Topics Concern  . Not on file  Social History Narrative  . Not on file   Social Determinants of Health   Financial Resource Strain:   . Difficulty of Paying Living Expenses: Not on file  Food Insecurity:   . Worried About Charity fundraiser in the Last Year: Not on file  . Ran Out of Food in the Last Year: Not on file  Transportation Needs:   . Lack of Transportation (Medical): Not on file  . Lack of Transportation (Non-Medical): Not on file  Physical Activity:   . Days of Exercise per Week: Not on file  . Minutes of Exercise per Session: Not on file  Stress:   . Feeling of Stress : Not on file  Social  Connections:   . Frequency of Communication with Friends and Family: Not on file  . Frequency of Social Gatherings with Friends and Family: Not on file  . Attends Religious Services: Not on file  . Active Member of Clubs or Organizations: Not on file  . Attends Archivist Meetings: Not on file  . Marital Status: Not on file  Intimate Partner Violence:   . Fear of Current or Ex-Partner: Not on file  . Emotionally Abused: Not on file  . Physically Abused: Not on file  . Sexually Abused: Not on file     Family History:    Family History  Problem Relation Age of Onset  . Cancer Father   . Diabetes Father   . Colon cancer Neg Hx      Review of Systems    General:  No chills, fever, night sweats or weight changes.  Cardiovascular:  No dyspnea on exertion, edema, orthopnea, palpitations, paroxysmal nocturnal dyspnea. Positive for chest pain.  Dermatological: No rash, lesions/masses Respiratory: No cough, dyspnea Urologic: No hematuria, dysuria Abdominal:   No nausea, vomiting, diarrhea, bright red blood per rectum, melena, or hematemesis Neurologic:  No visual changes, wkns, changes in mental status. All other systems reviewed and are otherwise negative except as noted above.  Physical Exam/Data    Vitals:   05/16/20 2049 05/17/20 0554 05/17/20 1000 05/17/20 1007  BP: 121/64 (!) 144/72 (!) 86/46 135/67  Pulse:  73 77 80  Resp: 20 20 20    Temp: 97.9 F (36.6 C) 98.5 F (36.9 C) 98.9 F (37.2 C)   TempSrc: Oral Oral Oral   SpO2: 100% 100% 97%   Weight:      Height:        Intake/Output Summary (Last 24 hours) at 05/17/2020 1023 Last data filed at 05/16/2020 2203 Gross per 24 hour  Intake 250 ml  Output --  Net 250 ml   Filed Weights   05/16/20 1047  Weight: 65.8 kg   Body mass index is 26.52 kg/m.   General: Pleasant female appearing in NAD Psych: Normal affect. Neuro: Alert and oriented X 3. Moves all extremities spontaneously. HEENT: Normal  Neck:  Supple without bruits or JVD. Lungs:  Resp regular and unlabored, CTA without wheezing or rales. Heart: RRR no s3, s4, or murmurs. Abdomen: Soft, non-tender, non-distended,  BS + x 4.  Extremities: No clubbing, cyanosis or lower extremity edema. DP/PT/Radials 2+ and equal bilaterally.   EKG:  The EKG was personally reviewed and demonstrates: NSR, HR 75 with no acute ST abnormalities when compared to prior tracings.   Telemetry:  Telemetry was personally reviewed and demonstrates: NSR, HR in 70's to 80's.    Labs/Studies     Relevant CV Studies:  Echocardiogram: 07/2011 Study Conclusions   - Left ventricle: The cavity size was normal. Wall thickness  was increased in a pattern of mild LVH. Systolic function  was normal. The estimated ejection fraction was in the  range of 55% to 60%. Although no diagnostic regional wall  motion abnormality was identified, this possibility cannot  be completely excluded on the basis of this study.  Features are consistent with a pseudonormal left  ventricular filling pattern, with concomitant abnormal  relaxation and increased filling pressure (grade 2  diastolic dysfunction).  - Aortic valve: There was no stenosis.  - Mitral valve: No significant regurgitation.  - Right ventricle: The cavity size was normal. Systolic  function was normal.  - Pulmonary arteries: No complete TR doppler jet so unable  to estimate PA systolic pressure.  - Inferior vena cava: The vessel was normal in size; the  respirophasic diameter changes were in the normal range (=  50%); findings are consistent with normal central venous  pressure.  Impressions:   - Normal LV size with mild LV hypertrophy. EF 55-60%.  Moderate diastolic dysfunction. Normal RV size and  systolic function. No significant valvular abnormalities.   Cardiac Catheterization: 07/2011  Medications:              Versed: 3 mg's             Fentanyl: 0  ug's  Coronary Arteries: Right dominant with no anomalies  LM: normal  LAD:  30% proximal and mid.  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  Vessel size only about 48mm             D1: normal             D2: normal  Circumflex:  Normal             OM1: normal     RCA: Dominant and normal  Ventriculography: EF: 60 %, no RWMA;s  Hemodynamics:             Aortic Pressure: 127/60  mmHg             LV Pressure: 127/ 10  mmHg  Impression:  Films reviewed with Dr Angelena Form Both agree that distal LAD too small to intervene on.  She has horrible control of her DM with A1c over 10 Medical Rx warranted.    Laboratory Data:  Chemistry Recent Labs  Lab 05/16/20 1049  NA 137  K 3.5  CL 102  CO2 24  GLUCOSE 129*  BUN 13  CREATININE 0.69  CALCIUM 9.8  GFRNONAA >60  ANIONGAP 11    No results for input(s): PROT, ALBUMIN, AST, ALT, ALKPHOS, BILITOT in the last 168 hours. Hematology Recent Labs  Lab 05/16/20 1049 05/17/20 1002  WBC 8.0 6.9  RBC 4.60 4.61  HGB 13.2 13.2  HCT 40.6 41.3  MCV 88.3 89.6  MCH 28.7 28.6  MCHC 32.5 32.0  RDW 13.5 13.4  PLT 169 181   Cardiac EnzymesNo results for input(s): TROPONINI in the last 168 hours. No results for input(s): TROPIPOC in the  last 168 hours.  BNPNo results for input(s): BNP, PROBNP in the last 168 hours.  DDimer No results for input(s): DDIMER in the last 168 hours.  Radiology/Studies:  DG Chest 2 View  Result Date: 05/16/2020 CLINICAL DATA:  Chest pain EXAM: CHEST - 2 VIEW COMPARISON:  07/29/2012 FINDINGS: The heart size and mediastinal contours are within normal limits. No focal airspace consolidation, pleural effusion, or pneumothorax. No acute osseous findings. Evidence of calcific tendinopathy of the left rotator cuff. IMPRESSION: No active cardiopulmonary disease. Electronically Signed   By: Davina Poke D.O.   On: 05/16/2020 11:25     Assessment & Plan    1. Chest Pain with Known History of  CAD - She presents with a 30-minute episode of chest pain which occurred the day of admission and spontaneously resolved. She is active at baseline and denies any recent chest pain or dyspnea with walking for exercise.  - Initial HS Troponin values flat at 16 and 20. Will order a repeat HS Troponin for this AM. Echocardiogram is being performed with the official report pending. If repeat troponin is negative and echocardiogram is reassuring, would plan for an outpatient stress test given her known CAD (70% distal LAD stenosis by cath in 2013 and too small for intervention) and cardiac risk factors. If Troponin is elevated or echo shows abnormalities, would plan for inpatient evaluation. If she has recurrent palpitations as an outpatient, would also consider an event monitor but no significant arrhythmias by telemetry this admission thus far.   2. HTN - BP has been variable from 86/46 - 135/67 since admission.  - She has been continued on PTA Amlodipine 10mg  daily with Olmesartan-HCTZ currently held given her variable readings.   3. HLD - Followed by PCP as an outpatient. She has been continued on PTA Atorvastatin.   4. Type 2 DM - Hgb A1c elevated to 9.5 this admission. Further management per admitting team.    For questions or updates, please contact [facility] Please consult [email or web address] for contact info under Cardiology/STEMI.  Signed, Erma Heritage, PA-C 05/17/2020, 10:23 AM Pager: [phone]   Attending note:  Patient seen and examined.  I reviewed her records and discussed the case with Ms. Ahmed Prima PA-C.  Ms. Menard was previously evaluated by Dr. Johnsie Schroeder back in 2013, known history of 70% distal LAD stenosis in small vessel segment that was managed medically. She is currently admitted to the hospital for evaluation after having had a 30-minute episode of chest discomfort.  This initially began while she was walking, associated with a sense of palpitations and nausea, but  spontaneously resolved and has not recurred under observation.  She typically walks for exercise without symptoms and states she has been compliant with her medications.  On examination she appears comfortable, no active symptoms.  He is afebrile, heart rate in the 70s to 80s in sinus rhythm by telemetry which I personally reviewed.  Blood pressure 135/67.  Lungs are clear.  Cardiac exam reveals RRR without gallop.  Pertinent lab work includes potassium 4.0, BUN 15, creatinine 0.63, high-sensitivity troponin I levels ranging from 60-21, hemoglobin 13.2, platelets 181, hemoglobin A1c 9.5%, TSH 1.77, SARS coronavirus 2 test negative  Chest x-ray reports no acute process.  I personally reviewed her ECG which shows normal sinus rhythm.  Patient presents after an episode of chest discomfort discussed above.  No clear evidence of ACS, ECG normal.  Echocardiogram pending and if LVEF normal range without wall motion abnormalities, would anticipate discharge home  with plan for outpatient Myoview.  Office follow-up arranged.  Satira Sark, M.D., F.A.C.C.

## 2020-05-17 NOTE — Discharge Summary (Signed)
Megan Schroeder, is a 74 y.o. female  DOB [birth year]  MRN [id number].  Admission date:  05/16/2020  Admitting Physician  Roxan Hockey, MD  Discharge Date:  05/17/2020   Primary MD  Lucia Gaskins, MD  Recommendations for primary care physician for things to follow:   1) your diabetes is out of control--- please increase your Metformin to 1000 mg twice daily and follow-up with y diabetic specialist/endocrinologist Dr. Loni Beckwith for improved diabetic control --- Endocrinologist Dr. Loni Beckwith, MD, Endocrinology, Diabetes & Metabolism-- [facility], [address] [place], [place] [address], Phone Number- [phone]  2) you will need stress test as advised by the cardiology team--- please come back for outpatient stress test according to the instructions given to you today by the cardiology team  3) please avoid strenuous activities until you have had a stress test and a been cleared by the cardiology team  Admission Diagnosis  Palpitations [R00.2] Precordial pain [R07.2] Chest pain [R07.9]   Discharge Diagnosis  Palpitations [R00.2] Precordial pain [R07.2] Chest pain [R07.9]    Principal Problem:   Chest pain Active Problems:   CAD (coronary artery disease)-LHC 07/2011    Hypertension   Diabetes mellitus ([place])   Hypothyroidism      Past Medical History:  Diagnosis Date  . Anxiety   . Diabetes mellitus   . Hyperlipidemia   . Hypertension   . Thyroid disease     Past Surgical History:  Procedure Laterality Date  . BREAST EXCISIONAL BIOPSY Left   . CATARACT EXTRACTION W/PHACO Left 12/16/2017   Procedure: CATARACT EXTRACTION PHACO AND INTRAOCULAR LENS PLACEMENT (IOC);  Surgeon: [name], MD;  Location: [facility];  Service: Ophthalmology;  Laterality: Left;  CDE: 11.65  . CATARACT EXTRACTION W/PHACO Right 12/30/2017   Procedure: CATARACT EXTRACTION PHACO AND INTRAOCULAR  LENS PLACEMENT RIGHT EYE;  Surgeon: [name], MD;  Location: [facility];  Service: Ophthalmology;  Laterality: Right;  CDE: 9.07  . COLONOSCOPY N/A 09/15/2015   Dr. Dudley Major multiple rectal and colonic polyps removed. Hepatic flexure with 9 mm polyp, multiple 5-7 mm polyps. Multiple cecal polyps with largest 1.5 cm, one 5 mm polyp in rectum. Path for colonic polyps with sessile serrated polyp without dysplasia, no high grade dysplasia, tubular adenomas, rectal hyperplastic polyp  . COLONOSCOPY WITH PROPOFOL N/A 07/20/2019   Procedure: COLONOSCOPY WITH PROPOFOL;  Surgeon: [name], MD;  Location: [facility];  Service: Endoscopy;  Laterality: N/A;  8:30am  . LEFT HEART CATHETERIZATION WITH CORONARY ANGIOGRAM N/A 07/23/2011   Procedure: LEFT HEART CATHETERIZATION WITH CORONARY ANGIOGRAM;  Surgeon: [name], MD;  Location: [facility] CATH LAB;  Service: Cardiovascular;  Laterality: N/A;  . POLYPECTOMY  07/20/2019   Procedure: POLYPECTOMY;  Surgeon: [name], MD;  Location: [facility];  Service: Endoscopy;;      HPI  from the history and physical done on the day of admission:    Megan Schroeder  is a 75 y.o. female  With PMHx relevant for HTN, CAD (  LHC in 07/2011 with Distal LAD stenosis-70 %), DM2, HLD and hypothyroidism who woke up around 8 AM with substernal chest discomfort associated with nausea, palpitations and dizziness, patient called EMS and was instructed to take aspirin--- chest pain improved but did not resolve -Chest pain did not really radiate anywhere, no diaphoresis, patient is having difficulty describing the character of the chest pain, additional history obtained from patient's husband at bedside, no emesis, no cough or fevers -Denies shortness of breath or pleuritic symptoms -She had hip and leg pains over the last couple days but not today -No significant leg swelling -Patient said what bothered her the most was actually the palpitations -She is Vaccinated against COVID-19  infection -Covid Test Neg Troponin--- 16>>>20 Creatinine 0.69, CXR w/o acute , EKG without acute ST changes  LHC 07/2011 LAD: 30% proximal and mid. 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 Vessel size only about 41mm D1: normal D2: normal Circumflex: Normal OM1: normal [facility]:[id number] and normal Ventriculography: EF: 60 %, no RWMA;s Hemodynamics: Aortic Pressure: 127/60 mmHg LV Pressure: 127/ 10 mmHg Impression: Films reviewed with Dr Angelena Form Both agree that distal LAD too small to intervene on.   Hospital Course:    1)Atypical Chest Pain-  Cardiovascular risk factors include DM2, HTN, HLD, and  history of known CAD-- LHC in 07/2011 with Distal LAD stenosis-70 % - on  telemetry monitored unit no significant arrhythmia or significant ST segment changes,  -Patient has ruled out for ACS by serial troponins and EKGs  --- Cardiology consult appreciated recommends outpatient stress testing for further cardiovascular risk stratification . --Given palpitations arrhythmias including possible A. fib/a flutter or SVT remains in the differential -- TSH 1.77, -Echo  with preserved EF and no significant abnormalities Prior LHC from 2013 as in HPI/subjective section C/n aspirin and Lipitor   2)DM2-A1c 9.5 consistent with uncontrolled DM with hyperglycemia PTA, -Patient advised to increase Metformin to 1 g twice daily and follow-up with endocrinologist Dr. Loni Beckwith ---Continue Wilder Glade, Trulicity and insulin regimen -Diet and lifestyle modifications discussed   3)Hypothyroidism- c/n Levothyroxine, TSH 1.77  4)HTN-stable, resume PTA meds  Disposition--- discharge home with plans for outpatient stress test  Dispo: The patient is from: Home  Anticipated d/c is to: Home  Discharge Condition: Stable,  Follow UP   Follow-up Information    Nida, Marella Chimes, MD Follow up.   Specialty:  Endocrinology Why: UnControlled diabetes Contact information: [place] [address] [phone]               Consults obtained -cardiology  Diet and Activity recommendation:  As advised  Discharge Instructions    Discharge Instructions    Call MD for:  difficulty breathing, headache or visual disturbances   Complete by: As directed    Call MD for:  persistant dizziness or light-headedness   Complete by: As directed    Call MD for:  persistant nausea and vomiting   Complete by: As directed    Call MD for:  temperature >100.4   Complete by: As directed    Diet - low sodium heart healthy   Complete by: As directed    Diet Carb Modified   Complete by: As directed    Discharge instructions   Complete by: As directed    1) your diabetes is out of control--- please increase your Metformin to 1000 mg twice daily and follow-up with y diabetic specialist/endocrinologist Dr. Loni Beckwith for improved diabetic control --- Endocrinologist Dr. Concha Se  Dorris Fetch, MD, Endocrinology, Diabetes & Metabolism-- [facility], [address] [place], [place] [address], Phone Number- [phone]  2) you will need stress test as advised by the cardiology team--- please come back for outpatient stress test according to the instructions given to you today by the cardiology team  3) please avoid strenuous activities until you have had a stress test and a been cleared by the cardiology team   Increase activity slowly   Complete by: As directed        Discharge Medications     Allergies as of 05/17/2020      Reactions   Penicillins Hives   Has patient had a PCN reaction causing immediate rash, facial/tongue/throat swelling, SOB or lightheadedness with hypotension: No Has patient had a PCN reaction causing severe rash involving mucus membranes or skin necrosis: Yes Has patient had a PCN reaction that required hospitalization: No Has patient had a PCN reaction occurring  within the last 10 years: No If all of the above answers are "NO", then may proceed with Cephalosporin use.   Hydrocodone Nausea And Vomiting      Medication List    STOP taking these medications   olmesartan-hydrochlorothiazide 40-12.5 MG tablet Commonly known as: BENICAR HCT   polyethylene glycol-electrolytes 420 g solution Commonly known as: TriLyte     TAKE these medications   acetaminophen 325 MG tablet Commonly known as: TYLENOL Take 2 tablets (650 mg total) by mouth every 6 (six) hours as needed for mild pain (or Fever >/= 101).   amLODipine 10 MG tablet Commonly known as: NORVASC Take 1 tablet (10 mg total) by mouth daily. For BP What changed:   when to take this  additional instructions   aspirin EC 81 MG tablet Take 1 tablet (81 mg total) by mouth daily with breakfast. What changed: when to take this   atorvastatin 20 MG tablet Commonly known as: LIPITOR Take 1 tablet (20 mg total) by mouth every evening.   cholecalciferol 1000 units tablet Commonly known as: VITAMIN D Take 1,000 Units by mouth daily.   CoQ-10 200 MG Caps Take 200 mg by mouth daily.   dapagliflozin propanediol 10 MG Tabs tablet Commonly known as: Farxiga Take 1 tablet (10 mg total) by mouth daily.   Fish Oil 1200 MG Caps Take 1,200 mg by mouth every other day.   levothyroxine 75 MCG tablet Commonly known as: SYNTHROID Take 1 tablet (75 mcg total) by mouth daily before breakfast.   metFORMIN 1000 MG tablet Commonly known as: GLUCOPHAGE Take 1 tablet (1,000 mg total) by mouth 2 (two) times daily with a meal. What changed:   medication strength  how much to take   Toujeo SoloStar 300 UNIT/ML Solostar Pen Generic drug: insulin glargine (1 Unit Dial) Inject 50 Units into the skin at bedtime.   Trulicity 3.55 DD/2.2GU Sopn Generic drug: Dulaglutide Inject 0.75 mg into the skin once a week.   vitamin C 1000 MG tablet Take 1,000 mg by mouth daily.      Major procedures  and Radiology Reports - PLEASE review detailed and final reports for all details, in brief -   DG Chest 2 View  Result Date: 05/16/2020 CLINICAL DATA:  Chest pain EXAM: CHEST - 2 VIEW COMPARISON:  07/29/2012 FINDINGS: The heart size and mediastinal contours are within normal limits. No focal airspace consolidation, pleural effusion, or pneumothorax. No acute osseous findings. Evidence of calcific tendinopathy of the left rotator cuff. IMPRESSION: No active cardiopulmonary disease. Electronically Signed  By: Davina Poke D.O.   On: 05/16/2020 11:25    Micro Results   Recent Results (from the past 240 hour(s))  Resp Panel by RT-PCR (Flu A&B, Covid) Nasopharyngeal Swab     Status: None   Collection Time: 05/16/20 12:14 PM   Specimen: Nasopharyngeal Swab; Nasopharyngeal(NP) swabs in vial transport medium  Result Value Ref Range Status   SARS Coronavirus 2 by RT PCR NEGATIVE NEGATIVE Final    Comment: (NOTE) SARS-CoV-2 target nucleic acids are NOT DETECTED.  The SARS-CoV-2 RNA is generally detectable in upper respiratory specimens during the acute phase of infection. The lowest concentration of SARS-CoV-2 viral copies this assay can detect is 138 copies/mL. A negative result does not preclude SARS-Cov-2 infection and should not be used as the sole basis for treatment or other patient management decisions. A negative result may occur with  improper specimen collection/handling, submission of specimen other than nasopharyngeal swab, presence of viral mutation(s) within the areas targeted by this assay, and inadequate number of viral copies(<138 copies/mL). A negative result must be combined with clinical observations, patient history, and epidemiological information. The expected result is Negative.  Fact Sheet for Patients:  EntrepreneurPulse.com.au  Fact Sheet for Healthcare Providers:  IncredibleEmployment.be  This test is no t yet approved or  cleared by the [place] FDA and  has been authorized for detection and/or diagnosis of SARS-CoV-2 by FDA under an Emergency Use Authorization (EUA). This EUA will remain  in effect (meaning this test can be used) for the duration of the COVID-19 declaration under Section 564(b)(1) of the Act, 21 U.S.C.section 360bbb-3(b)(1), unless the authorization is terminated  or revoked sooner.       Influenza A by PCR NEGATIVE NEGATIVE Final   Influenza B by PCR NEGATIVE NEGATIVE Final    Comment: (NOTE) The Xpert Xpress SARS-CoV-2/FLU/RSV plus assay is intended as an aid in the diagnosis of influenza from Nasopharyngeal swab specimens and should not be used as a sole basis for treatment. Nasal washings and aspirates are unacceptable for Xpert Xpress SARS-CoV-2/FLU/RSV testing.  Fact Sheet for Patients: EntrepreneurPulse.com.au  Fact Sheet for Healthcare Providers: IncredibleEmployment.be  This test is not yet approved or cleared by the [place] FDA and has been authorized for detection and/or diagnosis of SARS-CoV-2 by FDA under an Emergency Use Authorization (EUA). This EUA will remain in effect (meaning this test can be used) for the duration of the COVID-19 declaration under Section 564(b)(1) of the Act, 21 U.S.C. section 360bbb-3(b)(1), unless the authorization is terminated or revoked.  Performed at [facility], [address], [place], [place] [address]     Today   Subjective    Amri Lien today has no new complaints   No fever  Or chills   No Nausea, Vomiting or Diarrhea -Ambulating in  hallways without chest pains palpitations dizziness or dyspnea on exertion      Patient has been seen and examined prior to discharge   Objective   Blood pressure 135/67, pulse 80, temperature 98.9 F (37.2 C), temperature source Oral, resp. rate 20, height 5\' 2"  (1.575 m), weight 65.8 kg, SpO2 97 %.   Intake/Output Summary (Last 24  hours) at 05/17/2020 1159 Last data filed at 05/16/2020 2203 Gross per 24 hour  Intake 250 ml  Output --  Net 250 ml    Exam Gen:- Awake Alert, no acute distress  HEENT:- [place].AT, No sclera icterus Neck-Supple Neck,No JVD,.  Lungs-  CTAB , good air movement bilaterally  CV- S1,  S2 normal, regular Abd-  +ve B.Sounds, Abd Soft, No tenderness,    Extremity/Skin:- No  edema,   good pulses Psych-affect is appropriate, oriented x3 Neuro-no new focal deficits, no tremors    Data Review   CBC w Diff:  Lab Results  Component Value Date   WBC 6.9 05/17/2020   HGB 13.2 05/17/2020   HCT 41.3 05/17/2020   PLT 181 05/17/2020   LYMPHOPCT 23 08/10/2018   MONOPCT 6 08/10/2018   EOSPCT 2 08/10/2018   BASOPCT 0 08/10/2018    CMP:  Lab Results  Component Value Date   NA 138 05/17/2020   K 4.0 05/17/2020   CL 103 05/17/2020   CO2 25 05/17/2020   BUN 15 05/17/2020   CREATININE 0.63 05/17/2020   PROT 8.4 (H) 08/10/2018   ALBUMIN 4.6 08/10/2018   BILITOT 0.4 08/10/2018   ALKPHOS 63 08/10/2018   AST 25 08/10/2018   ALT 23 08/10/2018  .   Total Discharge time is about 33 minutes  Roxan Hockey M.D on 05/17/2020 at 11:59 AM  Go to [email or web address] -  for contact info  [facility] - Office  [phone]

## 2020-05-17 NOTE — Plan of Care (Signed)

## 2020-05-17 NOTE — Plan of Care (Signed)
  Problem: Education: Goal: Knowledge of General Education information will improve Description: Including pain rating scale, medication(s)/side effects and non-pharmacologic comfort measures 05/17/2020 1201 by Cameron Ali, RN Outcome: Completed/Met 05/17/2020 1108 by Cameron Ali, RN Outcome: Progressing   Problem: Health Behavior/Discharge Planning: Goal: Ability to manage health-related needs will improve 05/17/2020 1201 by Cameron Ali, RN Outcome: Completed/Met 05/17/2020 1108 by Cameron Ali, RN Outcome: Progressing   Problem: Clinical Measurements: Goal: Ability to maintain clinical measurements within normal limits will improve 05/17/2020 1201 by Cameron Ali, RN Outcome: Completed/Met 05/17/2020 1108 by Cameron Ali, RN Outcome: Progressing Goal: Will remain free from infection 05/17/2020 1201 by Cameron Ali, RN Outcome: Completed/Met 05/17/2020 1108 by Cameron Ali, RN Outcome: Progressing Goal: Diagnostic test results will improve 05/17/2020 1201 by Cameron Ali, RN Outcome: Completed/Met 05/17/2020 1108 by Cameron Ali, RN Outcome: Progressing Goal: Respiratory complications will improve 05/17/2020 1201 by Cameron Ali, RN Outcome: Completed/Met 05/17/2020 1108 by Cameron Ali, RN Outcome: Progressing Goal: Cardiovascular complication will be avoided 05/17/2020 1201 by Cameron Ali, RN Outcome: Completed/Met 05/17/2020 1108 by Cameron Ali, RN Outcome: Progressing   Problem: Activity: Goal: Risk for activity intolerance will decrease 05/17/2020 1201 by Cameron Ali, RN Outcome: Completed/Met 05/17/2020 1108 by Cameron Ali, RN Outcome: Progressing   Problem: Nutrition: Goal: Adequate nutrition will be maintained 05/17/2020 1201 by Cameron Ali, RN Outcome: Completed/Met 05/17/2020 1108 by Cameron Ali, RN Outcome: Progressing   Problem: Coping: Goal: Level of anxiety will  decrease 05/17/2020 1201 by Cameron Ali, RN Outcome: Completed/Met 05/17/2020 1108 by Cameron Ali, RN Outcome: Progressing   Problem: Elimination: Goal: Will not experience complications related to bowel motility 05/17/2020 1201 by Cameron Ali, RN Outcome: Completed/Met 05/17/2020 1108 by Cameron Ali, RN Outcome: Progressing Goal: Will not experience complications related to urinary retention 05/17/2020 1201 by Cameron Ali, RN Outcome: Completed/Met 05/17/2020 1108 by Cameron Ali, RN Outcome: Progressing   Problem: Pain Managment: Goal: General experience of comfort will improve 05/17/2020 1201 by Cameron Ali, RN Outcome: Completed/Met 05/17/2020 1108 by Cameron Ali, RN Outcome: Progressing   Problem: Safety: Goal: Ability to remain free from injury will improve 05/17/2020 1201 by Cameron Ali, RN Outcome: Completed/Met 05/17/2020 1108 by Cameron Ali, RN Outcome: Progressing   Problem: Skin Integrity: Goal: Risk for impaired skin integrity will decrease 05/17/2020 1201 by Cameron Ali, RN Outcome: Completed/Met 05/17/2020 1108 by Cameron Ali, RN Outcome: Progressing

## 2020-05-17 NOTE — Telephone Encounter (Signed)
Out patient lexiscan ordered for CP per Dr.McDowell

## 2020-05-23 ENCOUNTER — Encounter (HOSPITAL_COMMUNITY)
Admission: RE | Admit: 2020-05-23 | Discharge: 2020-05-23 | Disposition: A | Discharge status: Home / Self Care | Payer: Medicare Other | Source: Ambulatory Visit | Attending: Cardiology | Admitting: Cardiology

## 2020-05-23 ENCOUNTER — Encounter (HOSPITAL_BASED_OUTPATIENT_CLINIC_OR_DEPARTMENT_OTHER)
Admit: 2020-05-23 | Discharge: 2020-05-23 | Disposition: A | Discharge status: Home / Self Care | Payer: Medicare Other | Attending: Cardiology | Admitting: Cardiology

## 2020-05-23 ENCOUNTER — Other Ambulatory Visit: Payer: Self-pay

## 2020-05-23 DIAGNOSIS — R079 Chest pain, unspecified: Secondary | ICD-10-CM | POA: Diagnosis not present

## 2020-05-23 LAB — NM MYOCAR MULTI W/SPECT W/WALL MOTION / EF
LV dias vol: 65 mL (ref 46–106)
LV sys vol: 11 mL
Peak HR: 141 {beats}/min
RATE: 0.41
Rest HR: 85 {beats}/min
SDS: 1
SRS: 3
SSS: 4
TID: 1.1

## 2020-05-23 MED ORDER — TECHNETIUM TC 99M TETROFOSMIN IV KIT
10.0000 | PACK | Freq: Once | INTRAVENOUS | Status: AC | PRN
  Administered 2020-05-23: 09:00:00 11 via INTRAVENOUS

## 2020-05-23 MED ORDER — REGADENOSON 0.4 MG/5ML IV SOLN
INTRAVENOUS | Status: AC
  Administered 2020-05-23: 10:00:00 0.4 mg via INTRAVENOUS
  Filled 2020-05-23: qty 5

## 2020-05-23 MED ORDER — SODIUM CHLORIDE FLUSH 0.9 % IV SOLN
INTRAVENOUS | Status: AC
  Administered 2020-05-23: 10:00:00 10 mL via INTRAVENOUS
  Filled 2020-05-23: qty 10

## 2020-05-23 MED ORDER — TECHNETIUM TC 99M TETROFOSMIN IV KIT
30.0000 | PACK | Freq: Once | INTRAVENOUS | Status: AC | PRN
  Administered 2020-05-23: 10:00:00 29.1 via INTRAVENOUS

## 2020-05-26 NOTE — Progress Notes (Signed)
Cardiology Office Note  Date: 05/27/2020   ID: Megan Schroeder, DOB [birth year], MRN [id number]  PCP:  Lucia Gaskins, MD  Cardiologist:  No primary care provider on file. Electrophysiologist:  None   Chief Complaint: Hospital follow-up  History of Present Illness: Megan Schroeder is a 74 y.o. female with a history of HTN, HLD, DM2, hypothyroidism, CAD.  Recent presentation to [facility] for chest pain on 05/16/2020 with nausea, palpitations, dizziness.  No radiation of chest pain.  No diaphoresis.  She denied any shortness of breath.  Having palpitations which were the most bothersome.  Troponins were 16-20.  EKG without acute changes.  EKG no acute changes.   Patient is here status post stress test after recent presentation for chest pain.  Nuclear stress test was negative for ischemia and deemed a low risk study.  Patient states she has had no further chest pain since that day.  Blood pressure is elevated today.  At recent ED visit her blood pressure was 129/61.  She denies any anginal or exertional symptoms, palpitations or arrhythmias, orthostatic symptoms, stroke or TIA-like symptoms, PND, orthopnea, bleeding.  Denies any claudication-like symptoms, DVT or PE-like symptoms, or lower extremity edema.  Patient states he is very active on a daily basis and walks every day including walking on a treadmill.  Past Medical History:  Diagnosis Date  . Anxiety   . Diabetes mellitus   . Hyperlipidemia   . Hypertension   . Thyroid disease     Past Surgical History:  Procedure Laterality Date  . BREAST EXCISIONAL BIOPSY Left   . CATARACT EXTRACTION W/PHACO Left 12/16/2017   Procedure: CATARACT EXTRACTION PHACO AND INTRAOCULAR LENS PLACEMENT (IOC);  Surgeon: [name], MD;  Location: [facility];  Service: Ophthalmology;  Laterality: Left;  CDE: 11.65  . CATARACT EXTRACTION W/PHACO Right 12/30/2017   Procedure: CATARACT EXTRACTION PHACO AND INTRAOCULAR LENS PLACEMENT RIGHT EYE;   Surgeon: [name], MD;  Location: [facility];  Service: Ophthalmology;  Laterality: Right;  CDE: 9.07  . COLONOSCOPY N/A 09/15/2015   Dr. Dudley Major multiple rectal and colonic polyps removed. Hepatic flexure with 9 mm polyp, multiple 5-7 mm polyps. Multiple cecal polyps with largest 1.5 cm, one 5 mm polyp in rectum. Path for colonic polyps with sessile serrated polyp without dysplasia, no high grade dysplasia, tubular adenomas, rectal hyperplastic polyp  . COLONOSCOPY WITH PROPOFOL N/A 07/20/2019   Procedure: COLONOSCOPY WITH PROPOFOL;  Surgeon: [name], MD;  Location: [facility];  Service: Endoscopy;  Laterality: N/A;  8:30am  . LEFT HEART CATHETERIZATION WITH CORONARY ANGIOGRAM N/A 07/23/2011   Procedure: LEFT HEART CATHETERIZATION WITH CORONARY ANGIOGRAM;  Surgeon: [name], MD;  Location: [facility] CATH LAB;  Service: Cardiovascular;  Laterality: N/A;  . POLYPECTOMY  07/20/2019   Procedure: POLYPECTOMY;  Surgeon: [name], MD;  Location: [facility];  Service: Endoscopy;;    Current Outpatient Medications  Medication Sig Dispense Refill  . acetaminophen (TYLENOL) 325 MG tablet Take 2 tablets (650 mg total) by mouth every 6 (six) hours as needed for mild pain (or Fever >/= 101). 12 tablet 0  . amLODipine (NORVASC) 10 MG tablet Take 1 tablet (10 mg total) by mouth daily. For BP 90 tablet 3  . Ascorbic Acid (VITAMIN C) 1000 MG tablet Take 1,000 mg by mouth daily.    Marland Kitchen aspirin EC 81 MG tablet Take 1 tablet (81 mg total) by mouth daily with breakfast. 30 tablet 11  .  atorvastatin (LIPITOR) 20 MG tablet Take 1 tablet (20 mg total) by mouth every evening. 90 tablet 3  . cholecalciferol (VITAMIN D) 1000 UNITS tablet Take 1,000 Units by mouth daily.     . Coenzyme Q10 (COQ-10) 200 MG CAPS Take 200 mg by mouth daily.     . dapagliflozin propanediol (FARXIGA) 10 MG TABS tablet Take 1 tablet (10 mg total) by mouth daily. 90 tablet 3  . levothyroxine (SYNTHROID) 75 MCG tablet Take 1 tablet (75 mcg  total) by mouth daily before breakfast. 90 tablet 3  . metFORMIN (GLUCOPHAGE) 1000 MG tablet Take 1 tablet (1,000 mg total) by mouth 2 (two) times daily with a meal. 180 tablet 3  . Omega-3 Fatty Acids (FISH OIL) 1200 MG CAPS Take 1,200 mg by mouth every other day.     Nelva Nay SOLOSTAR 300 UNIT/ML SOPN Inject 50 Units into the skin at bedtime.     . TRULICITY 6.60 YT/0.1SW SOPN Inject 0.75 mg into the skin once a week.     No current facility-administered medications for this visit.   Allergies:  Penicillins and Hydrocodone   Social History: The patient  reports that she has never smoked. She has never used smokeless tobacco. She reports that she does not drink alcohol and does not use drugs.   Family History: The patient's family history includes Cancer in her father; Diabetes in her father.   ROS:  Please see the history of present illness. Otherwise, complete review of systems is positive for none.  All other systems are reviewed and negative.   Physical Exam: VS:  BP (!) 160/68   Pulse 97   Ht 5\' 5"  (1.651 m)   Wt 158 lb (71.7 kg)   SpO2 98%   BMI 26.29 kg/m , BMI Body mass index is 26.29 kg/m.  Wt Readings from Last 3 Encounters:  05/27/20 158 lb (71.7 kg)  05/16/20 145 lb (65.8 kg)  07/20/19 155 lb (70.3 kg)    General: Patient appears comfortable at rest. Neck: Supple, no elevated JVP or carotid bruits, no thyromegaly. Lungs: Clear to auscultation, nonlabored breathing at rest. Cardiac: Regular rate and rhythm, no S3 or significant systolic murmur, no pericardial rub. Extremities: No pitting edema, distal pulses 2+. Skin: Warm and dry. Musculoskeletal: No kyphosis. Neuropsychiatric: Alert and oriented x3, affect grossly appropriate.  ECG:  EKG 05/16/2020 normal sinus rhythm rate of 75.  Recent Labwork: 05/16/2020: TSH 1.772 05/17/2020: BUN 15; Creatinine, Ser 0.63; Hemoglobin 13.2; Platelets 181; Potassium 4.0; Sodium 138     Component Value Date/Time   CHOL 147  07/24/2011 0516   TRIG 147 07/24/2011 0516   HDL 42 07/24/2011 0516   CHOLHDL 3.5 07/24/2011 0516   VLDL 29 07/24/2011 0516   LDLCALC 76 07/24/2011 0516    Other Studies Reviewed Today:  NST 05/23/2020  Narrative & Impression   There was no ST segment deviation noted during stress.  The study is normal. There are no perfusion defects consistent with prior infarct or current ischemia.  This is a low risk study.  The left ventricular ejection fraction is hyperdynamic (>65%).     Echocardiogram 05/17/2020 1. Left ventricular ejection fraction, by estimation, is 65 to 70%. The left ventricle has normal function. The left ventricle has no regional wall motion abnormalities. There is mild left ventricular hypertrophy. Left ventricular diastolic parameters are indeterminate. 2. Right ventricular systolic function is normal. The right ventricular size is normal. Tricuspid regurgitation signal is inadequate for assessing PA pressure.  3. The mitral valve is grossly normal. Trivial mitral valve regurgitation. 4. The aortic valve is tricuspid. Aortic valve regurgitation is not visualized. Mild aortic valve sclerosis is present, with no evidence of aortic valve stenosis. 5. The inferior vena cava is normal in size with greater than 50% respiratory variability, suggesting right atrial pressure of 3 mmHg.    LHC 07/2011 LAD: 30% proximal and mid. 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 Vessel size only about 63mm D1: normal D2: normal Circumflex: Normal OM1: normal UDJ:[id number] and normal Ventriculography: EF: 60 %, no RWMA;s Hemodynamics: Aortic Pressure: 127/60 mmHg LV Pressure: 127/ 10 mmHg Impression: Films reviewed with Dr Angelena Form Both agree that distal LAD too small to intervene on.   Assessment and Plan:  1. Atypical chest pain   2. Essential hypertension   3. Mixed hyperlipidemia    4. Type 2 diabetes mellitus without complication, with long-term current use of insulin ([place])    1. Atypical chest pain Recent presentation to the emergency room with chest pain.  She was ruled out for ACS.  An outpatient stress test was scheduled which negative for ischemia and deemed a low risk study.  Patient states she has had no chest pain prior to that event or subsequent to the event.  She states she was recently started on Trulicity a few days prior to her symptoms.  She is wondering if this may have had an adverse effect on her.  2. Essential hypertension Blood pressure elevated today at 160/68.  Blood pressure at recent ED visit was 129/61.  Continue amlodipine 10 mg daily.  Advised her to start checking her blood pressure about 3 times a week approximately 2 to 3 hours after taking her amlodipine.  Advised her goal is at or below 130/80.  Advised her if sustained blood pressures above 130/80 to call us and she may need some adjustment on her antihypertensive medication  3. Mixed hyperlipidemia Continue atorvastatin 20 mg daily.  Continue coenzyme Q10 200 mg daily.  Continue omega-3 fatty acids 200 mg q. OD  4. Type 2 diabetes mellitus without complication, with long-term current use of insulin ([facility]) Continue Metformin 100 mg p.o. twice daily.  Continue Farxiga 10 mg daily.  Continue Toujeo 50 units daily at bedtime.  Continue Trulicity 8.58 mg subcu weekly.  Follow-up with PCP regarding possible reaction to recent initiation of Trulicity.  Medication Adjustments/Labs and Tests Ordered: Current medicines are reviewed at length with the patient today.  Concerns regarding medicines are outlined above.   Disposition: Follow-up with Dr. Domenic Polite or [facility] 6 months  Signed, Levell July, NP 05/27/2020 10:01 AM    [facility] [address], [place], [place] [address] Phone: [phone]; Fax: [phone]

## 2020-05-27 ENCOUNTER — Encounter: Payer: Self-pay | Admitting: Family Medicine

## 2020-05-27 ENCOUNTER — Ambulatory Visit: Payer: Medicare Other | Admitting: Family Medicine

## 2020-05-27 VITALS — BP 160/68 | HR 97 | Ht 65.0 in | Wt 158.0 lb

## 2020-05-27 DIAGNOSIS — I10 Essential (primary) hypertension: Secondary | ICD-10-CM | POA: Diagnosis not present

## 2020-05-27 DIAGNOSIS — R0789 Other chest pain: Secondary | ICD-10-CM

## 2020-05-27 DIAGNOSIS — E119 Type 2 diabetes mellitus without complications: Secondary | ICD-10-CM

## 2020-05-27 DIAGNOSIS — E782 Mixed hyperlipidemia: Secondary | ICD-10-CM | POA: Diagnosis not present

## 2020-05-27 DIAGNOSIS — Z794 Long term (current) use of insulin: Secondary | ICD-10-CM

## 2020-05-27 NOTE — Patient Instructions (Signed)
Your physician wants you to follow-up in: 6 MONTHS WITH MD You will receive a reminder letter in the mail two months in advance. If you don't receive a letter, please call our office to schedule the follow-up appointment.  Your physician recommends that you continue on your current medications as directed. Please refer to the Current Medication list given to you today.  Thank you for choosing [facility]!!    

## 2020-08-08 DIAGNOSIS — I119 Hypertensive heart disease without heart failure: Secondary | ICD-10-CM | POA: Diagnosis not present

## 2020-08-08 DIAGNOSIS — E7849 Other hyperlipidemia: Secondary | ICD-10-CM | POA: Diagnosis not present

## 2020-08-08 DIAGNOSIS — E1165 Type 2 diabetes mellitus with hyperglycemia: Secondary | ICD-10-CM | POA: Diagnosis not present

## 2020-08-08 DIAGNOSIS — R5383 Other fatigue: Secondary | ICD-10-CM | POA: Diagnosis not present

## 2020-08-08 DIAGNOSIS — K625 Hemorrhage of anus and rectum: Secondary | ICD-10-CM | POA: Diagnosis not present

## 2020-08-09 DIAGNOSIS — L84 Corns and callosities: Secondary | ICD-10-CM | POA: Diagnosis not present

## 2020-08-09 DIAGNOSIS — M79676 Pain in unspecified toe(s): Secondary | ICD-10-CM | POA: Diagnosis not present

## 2020-08-09 DIAGNOSIS — E1151 Type 2 diabetes mellitus with diabetic peripheral angiopathy without gangrene: Secondary | ICD-10-CM | POA: Diagnosis not present

## 2020-08-09 DIAGNOSIS — B351 Tinea unguium: Secondary | ICD-10-CM | POA: Diagnosis not present

## 2020-08-11 DIAGNOSIS — H35373 Puckering of macula, bilateral: Secondary | ICD-10-CM | POA: Diagnosis not present

## 2020-08-11 DIAGNOSIS — D3132 Benign neoplasm of left choroid: Secondary | ICD-10-CM | POA: Diagnosis not present

## 2020-08-11 DIAGNOSIS — Z961 Presence of intraocular lens: Secondary | ICD-10-CM | POA: Diagnosis not present

## 2020-08-11 DIAGNOSIS — E119 Type 2 diabetes mellitus without complications: Secondary | ICD-10-CM | POA: Diagnosis not present

## 2020-08-11 DIAGNOSIS — H26493 Other secondary cataract, bilateral: Secondary | ICD-10-CM | POA: Diagnosis not present

## 2020-08-31 NOTE — Progress Notes (Signed)
[facility] Note  09/01/2020     CHIEF COMPLAINT Patient presents for Retina Evaluation   HISTORY OF PRESENT ILLNESS: Megan Schroeder is a 75 y.o. female who presents to the clinic today for:   HPI    Retina Evaluation    In both eyes.  Associated Symptoms Glare.  I, the attending physician,  performed the HPI with the patient and updated documentation appropriately.          Comments    Patient her for Retina Evaluation. Referred by Dr [name]. Patient states vision is good, but is cloudy. Feels like a film over OU. No eye pain. Dr said she needed to have laser done.        Last edited by [name], MD on 09/01/2020 12:24 PM. (History)    Patient states vision is cloudy. Feels like a film over vision OU, starting about 3 months ago.  Referring physician: Celestia Khat, [place],  [place] [address]  HISTORICAL INFORMATION:   Selected notes from the MEDICAL RECORD NUMBER Referred by Dr. [name]   CURRENT MEDICATIONS: Current Outpatient Medications (Ophthalmic Drugs)  Medication Sig  . prednisoLONE acetate (PRED FORTE) 1 % ophthalmic suspension Place 1 drop into the right eye 4 (four) times daily for 7 days.   No current facility-administered medications for this visit. (Ophthalmic Drugs)   Current Outpatient Medications (Other)  Medication Sig  . acetaminophen (TYLENOL) 325 MG tablet Take 2 tablets (650 mg total) by mouth every 6 (six) hours as needed for mild pain (or Fever >/= 101).  Marland Kitchen amLODipine (NORVASC) 10 MG tablet Take 1 tablet (10 mg total) by mouth daily. For BP  . Ascorbic Acid (VITAMIN C) 1000 MG tablet Take 1,000 mg by mouth daily.  Marland Kitchen aspirin EC 81 MG tablet Take 1 tablet (81 mg total) by mouth daily with breakfast.  . atorvastatin (LIPITOR) 20 MG tablet Take 1 tablet (20 mg total) by mouth every evening.  . cholecalciferol (VITAMIN D) 1000 UNITS tablet Take 1,000 Units by mouth daily.   . Coenzyme Q10 (COQ-10) 200 MG  CAPS Take 200 mg by mouth daily.   . dapagliflozin propanediol (FARXIGA) 10 MG TABS tablet Take 1 tablet (10 mg total) by mouth daily.  Marland Kitchen levothyroxine (SYNTHROID) 75 MCG tablet Take 1 tablet (75 mcg total) by mouth daily before breakfast.  . metFORMIN (GLUCOPHAGE) 1000 MG tablet Take 1 tablet (1,000 mg total) by mouth 2 (two) times daily with a meal.  . Omega-3 Fatty Acids (FISH OIL) 1200 MG CAPS Take 1,200 mg by mouth every other day.   Nelva Nay SOLOSTAR 300 UNIT/ML SOPN Inject 50 Units into the skin at bedtime.   . TRULICITY 6.38 GY/6.5LD SOPN Inject 0.75 mg into the skin once a week.   No current facility-administered medications for this visit. (Other)      REVIEW OF SYSTEMS: ROS    Positive for: Endocrine, Cardiovascular, Eyes   Negative for: Constitutional, Gastrointestinal, Neurological, Skin, Genitourinary, Musculoskeletal, HENT, Respiratory, Psychiatric, Allergic/Imm, Heme/Lymph   Last edited by Roselee Nova D, COT on 09/01/2020  9:41 AM. (History)       ALLERGIES Allergies  Allergen Reactions  . Penicillins Hives    Has patient had a PCN reaction causing immediate rash, facial/tongue/throat swelling, SOB or lightheadedness with hypotension: No Has patient had a PCN reaction causing severe rash involving mucus membranes or skin necrosis: Yes Has patient had a PCN reaction that required hospitalization: No  Has patient had a PCN reaction occurring within the last 10 years: No If all of the above answers are "NO", then may proceed with Cephalosporin use.    Marland Kitchen Hydrocodone Nausea And Vomiting    PAST MEDICAL HISTORY Past Medical History:  Diagnosis Date  . Anxiety   . Diabetes mellitus   . Hyperlipidemia   . Hypertension   . Thyroid disease    Past Surgical History:  Procedure Laterality Date  . BREAST EXCISIONAL BIOPSY Left   . CATARACT EXTRACTION W/PHACO Left 12/16/2017   Procedure: CATARACT EXTRACTION PHACO AND INTRAOCULAR LENS PLACEMENT (IOC);  Surgeon: [name], MD;  Location: [facility];  Service: Ophthalmology;  Laterality: Left;  CDE: 11.65  . CATARACT EXTRACTION W/PHACO Right 12/30/2017   Procedure: CATARACT EXTRACTION PHACO AND INTRAOCULAR LENS PLACEMENT RIGHT EYE;  Surgeon: [name], MD;  Location: [facility];  Service: Ophthalmology;  Laterality: Right;  CDE: 9.07  . COLONOSCOPY N/A 09/15/2015   Dr. Dudley Major multiple rectal and colonic polyps removed. Hepatic flexure with 9 mm polyp, multiple 5-7 mm polyps. Multiple cecal polyps with largest 1.5 cm, one 5 mm polyp in rectum. Path for colonic polyps with sessile serrated polyp without dysplasia, no high grade dysplasia, tubular adenomas, rectal hyperplastic polyp  . COLONOSCOPY WITH PROPOFOL N/A 07/20/2019   Procedure: COLONOSCOPY WITH PROPOFOL;  Surgeon: [name], MD;  Location: [facility];  Service: Endoscopy;  Laterality: N/A;  8:30am  . LEFT HEART CATHETERIZATION WITH CORONARY ANGIOGRAM N/A 07/23/2011   Procedure: LEFT HEART CATHETERIZATION WITH CORONARY ANGIOGRAM;  Surgeon: [name], MD;  Location: [facility];  Service: Cardiovascular;  Laterality: N/A;  . POLYPECTOMY  07/20/2019   Procedure: POLYPECTOMY;  Surgeon: [name], MD;  Location: [facility];  Service: Endoscopy;;    FAMILY HISTORY Family History  Problem Relation Age of Onset  . Cancer Father   . Diabetes Father   . Colon cancer Neg Hx     SOCIAL HISTORY Social History   Tobacco Use  . Smoking status: Never Smoker  . Smokeless tobacco: Never Used  Substance Use Topics  . Alcohol use: No  . Drug use: No         OPHTHALMIC EXAM:  Base Eye Exam    Visual Acuity (Snellen - Linear)      Right Left   Dist cc 20/60 -2 20/25 -2   Dist ph cc NI 20/20 -2   Correction: Glasses       Tonometry (Tonopen, 8:25 AM)      Right Left   Pressure 21 21       Pupils      Dark Light Shape React APD   Right 3 2 Round Brisk None   Left 3 2 Round Brisk None       Visual Fields (Counting fingers)      Left  Right    Full Full       Extraocular Movement      Right Left    Full, Ortho Full, Ortho       Neuro/Psych    Oriented x3: Yes   Mood/Affect: Normal       Dilation    Both eyes: 1.0% Mydriacyl, 2.5% Phenylephrine @ 8:25 AM        Slit Lamp and Fundus Exam    Slit Lamp Exam      Right Left   Lids/Lashes Dermatochalasis - upper lid, mild MGD Dermatochalasis - upper lid, mild MGD  Conjunctiva/Sclera white and quiet white and quiet   Cornea trace PEE, well healed temporal cataract wound trace PEE, well healed temporal cataract wound   Anterior Chamber deep and clear deep and clear   Iris round and dilated round and dilated   Lens PC IOL in good position, 2+ PCO PC IOL in good position,1- 2+ PCO   Vitreous syneresis, PVD syneresis       Fundus Exam      Right Left   Disc pink and sharp, compact pink and sharp, compact   C/D Ratio 0.1 0.1   Macula flat, blunted foveal reflex, ERM with stria inferiorly, punctate IRH flat, blunted foveal reflex, mild ERM, rare MA, no edema   Vessels attenuated, tortuous attenuated, tortuous   Periphery attached, scattered MA attached, rare MA/DBH        Refraction    Wearing Rx      Sphere Cylinder Axis Add   Right [place]   +2.50   Left -0.25 +0.50 177 +2.50       Manifest Refraction (Auto)      Sphere Cylinder Axis Dist VA   Right +0.00 +1.25 130 20/40   Left -0.50 +1.50 155 20/30-2          IMAGING AND PROCEDURES  Imaging and Procedures for 09/01/2020  OCT, Retina - OU - Both Eyes       Right Eye Quality was good. Central Foveal Thickness: 388. Progression has no prior data. Findings include abnormal foveal contour, no SRF, intraretinal fluid, epiretinal membrane, macular pucker.   Left Eye Quality was good. Central Foveal Thickness: 269. Progression has no prior data. Findings include normal foveal contour, no SRF, epiretinal membrane, intraretinal fluid (Partial PVD).   Notes *Images captured and stored on  drive  Diagnosis / Impression:  OD: ERM with inferior pucker and central cyst OS: mild ERM with early pucker, partial PVD   Clinical management:  See below  Abbreviations: NFP - Normal foveal profile. CME - cystoid macular edema. PED - pigment epithelial detachment. IRF - intraretinal fluid. SRF - subretinal fluid. EZ - ellipsoid zone. ERM - epiretinal membrane. ORA - outer retinal atrophy. ORT - outer retinal tubulation. SRHM - subretinal hyper-reflective material. IRHM - intraretinal hyper-reflective material        Yag Capsulotomy - OD - Right Eye       Procedure note: YAG Capsulotomy, Right Eye  Informed consent obtained. Pre-op dilating drops (1% Topicamide and 2.5% Phenylephrine), and topical anesthesia given. Power: 6.7 mJ Shots: 31 Posterior capsulotomy in cruciate formation performed without difficulty. Patient tolerated procedure well. No complications. Rx pred forte 4 times a day for 7 days, then stop. Pt received written and verbal post laser education. Recheck in 2 weeks w/ dilated exam.               ASSESSMENT/PLAN:    ICD-10-CM   1. Epiretinal membrane (ERM) of both eyes  H35.373   2. Retinal edema  H35.81 OCT, Retina - OU - Both Eyes  3. Moderate nonproliferative diabetic retinopathy of both eyes without macular edema associated with type 2 diabetes mellitus ([place])  C37.6283   4. Essential hypertension  I10   5. Hypertensive retinopathy of both eyes  H35.033   6. Pseudophakia of both eyes  Z96.1   7. PCO (posterior capsular opacification), bilateral  H26.493 prednisoLONE acetate (PRED FORTE) 1 % ophthalmic suspension    Yag Capsulotomy - OD - Right Eye   1,2. Epiretinal membrane OU  -  The natural history, anatomy, potential for loss of vision, and treatment options including vitrectomy techniques and the complications of endophthalmitis, retinal detachment, vitreous hemorrhage, cataract progression and permanent vision loss discussed with the  patient.  - BCVA OD 20/40, OS 20/20-2  - OCT shows ERM with macular pucker OD, mild ERM OS  - blurred vision, no metamorphopsia  - monitor for now  3. Mild nonproliferative diabetic retinopathy OU - The incidence, risk factors for progression, natural history and treatment options for diabetic retinopathy were discussed with patient.   - The need for close monitoring of blood glucose, blood pressure, and serum lipids, avoiding cigarette or any type of tobacco, and the need for long term follow up was also discussed with patient. - BCVA OD: 20/40, OS: 20/20-2 - exam shows scattered MA, no NV - OCT without diabetic macular edema OU - f/u in 2-3 mos -- DFE/OCT  4,5. Hypertensive retinopathy OU - discussed importance of tight BP control - monitor   6. Pseudophakia OU  - s/p CE/IOL OU, 2019 (Dr. Geoffry Paradise)  - IOLs in good position, doing well  - monitor  7. PCO OU  - recommend yag laser OD, guarded prognosis OD due to ERM/macular pucker  - pt wishes to proceed with YAG capsulotomy OD  - RBA of procedure discussed, questions answered  - informed consent obtained and signed  - see procedure note  - start PF qid OD x 7 days  - return in 2 weeks for yag OS  Ophthalmic Meds Ordered this visit:  Meds ordered this encounter  Medications  . prednisoLONE acetate (PRED FORTE) 1 % ophthalmic suspension    Sig: Place 1 drop into the right eye 4 (four) times daily for 7 days.    Dispense:  10 mL    Refill:  0       Return in about 2 weeks (around 09/15/2020) for yag OS.  There are no Patient Instructions on file for this visit.   Explained the diagnoses, plan, and follow up with the patient and they expressed understanding.  Patient expressed understanding of the importance of proper follow up care.   This document serves as a record of services personally performed by Gardiner Sleeper, MD, PhD. It was created on their behalf by Estill Bakes, COT an ophthalmic technician. The creation of  this record is the provider's dictation and/or activities during the visit.    Electronically signed by: Estill Bakes, COT 3.23.22 @ 1:03 PM   This document serves as a record of services personally performed by Gardiner Sleeper, MD, PhD. It was created on their behalf by San Jetty. Owens Shark, OA an ophthalmic technician. The creation of this record is the provider's dictation and/or activities during the visit.    Electronically signed by: San Jetty. [place], [place] 03.24.2022 1:03 PM  Gardiner Sleeper, M.D., Ph.D. Diseases & Surgery of the Retina and Vitreous [facility]  I have reviewed the above documentation for accuracy and completeness, and I agree with the above. Gardiner Sleeper, M.D., Ph.D. 09/01/20 1:03 PM   Abbreviations: M myopia (nearsighted); A astigmatism; H hyperopia (farsighted); P presbyopia; Mrx spectacle prescription;  CTL contact lenses; OD right eye; OS left eye; OU both eyes  XT exotropia; ET esotropia; PEK punctate epithelial keratitis; PEE punctate epithelial erosions; DES dry eye syndrome; MGD meibomian gland dysfunction; ATs artificial tears; PFAT's preservative free artificial tears; [place] nuclear sclerotic cataract; PSC posterior subcapsular cataract; ERM epi-retinal membrane; PVD posterior  vitreous detachment; RD retinal detachment; DM diabetes mellitus; DR diabetic retinopathy; NPDR non-proliferative diabetic retinopathy; PDR proliferative diabetic retinopathy; CSME clinically significant macular edema; DME diabetic macular edema; dbh dot blot hemorrhages; CWS cotton wool spot; POAG primary open angle glaucoma; C/D cup-to-disc ratio; HVF humphrey visual field; GVF goldmann visual field; OCT optical coherence tomography; IOP intraocular pressure; BRVO Branch retinal vein occlusion; CRVO central retinal vein occlusion; CRAO central retinal artery occlusion; BRAO branch retinal artery occlusion; RT retinal tear; SB scleral buckle; PPV pars plana vitrectomy; VH  Vitreous hemorrhage; PRP panretinal laser photocoagulation; IVK intravitreal kenalog; VMT vitreomacular traction; MH Macular hole;  NVD neovascularization of the disc; NVE neovascularization elsewhere; AREDS age related eye disease study; ARMD age related macular degeneration; POAG primary open angle glaucoma; EBMD epithelial/anterior basement membrane dystrophy; ACIOL anterior chamber intraocular lens; IOL intraocular lens; PCIOL posterior chamber intraocular lens; Phaco/IOL phacoemulsification with intraocular lens placement; [place] photorefractive keratectomy; LASIK laser assisted in situ keratomileusis; HTN hypertension; DM diabetes mellitus; COPD chronic obstructive pulmonary disease

## 2020-09-01 ENCOUNTER — Ambulatory Visit (INDEPENDENT_AMBULATORY_CARE_PROVIDER_SITE_OTHER): Payer: Medicare Other | Admitting: Ophthalmology

## 2020-09-01 ENCOUNTER — Other Ambulatory Visit: Payer: Self-pay

## 2020-09-01 ENCOUNTER — Encounter (INDEPENDENT_AMBULATORY_CARE_PROVIDER_SITE_OTHER): Payer: Self-pay | Admitting: Ophthalmology

## 2020-09-01 DIAGNOSIS — H35033 Hypertensive retinopathy, bilateral: Secondary | ICD-10-CM

## 2020-09-01 DIAGNOSIS — H26493 Other secondary cataract, bilateral: Secondary | ICD-10-CM | POA: Diagnosis not present

## 2020-09-01 DIAGNOSIS — H3581 Retinal edema: Secondary | ICD-10-CM

## 2020-09-01 DIAGNOSIS — E113393 Type 2 diabetes mellitus with moderate nonproliferative diabetic retinopathy without macular edema, bilateral: Secondary | ICD-10-CM

## 2020-09-01 DIAGNOSIS — I10 Essential (primary) hypertension: Secondary | ICD-10-CM

## 2020-09-01 DIAGNOSIS — H35373 Puckering of macula, bilateral: Secondary | ICD-10-CM

## 2020-09-01 DIAGNOSIS — Z961 Presence of intraocular lens: Secondary | ICD-10-CM | POA: Diagnosis not present

## 2020-09-01 HISTORY — PX: YAG LASER APPLICATION: SHX6189

## 2020-09-01 MED ORDER — PREDNISOLONE ACETATE 1 % OP SUSP: 1.0000 [drp] | Freq: Four times a day (QID) | OPHTHALMIC | 0 refills | Status: AC

## 2020-09-13 NOTE — Progress Notes (Signed)
[facility] Note  09/15/2020     CHIEF COMPLAINT Patient presents for Retina Follow Up   HISTORY OF PRESENT ILLNESS: Megan Schroeder is a 75 y.o. female who presents to the clinic today for:   HPI    Retina Follow Up    Patient presents with  Other.  In both eyes.  This started weeks ago.  Severity is moderate.  Duration of 2 weeks.  Since onset it is gradually improving.  I, the attending physician,  performed the HPI with the patient and updated documentation appropriately.          Comments    75 y/o female pt here for 2 wk f/u.  S/p yag OD 3.24.22, and here for yag OS today.  VA OD much improved s/p yag.  No change in [place] OS.  Denies pain, FOL, floaters.  No gtts.  BS 149 this a.m.  A1C 7.1.       Last edited by [name], MD on 09/15/2020  1:16 PM. (History)    pt states her vision has greatly improved since the Yag laser OD a couple weeks ago, she is ready for OS today  Referring physician: Lucia Gaskins, MD [place],  [place] [address]  HISTORICAL INFORMATION:   Selected notes from the MEDICAL RECORD NUMBER Referred by Dr. [name]   CURRENT MEDICATIONS: No current outpatient medications on file. (Ophthalmic Drugs)   No current facility-administered medications for this visit. (Ophthalmic Drugs)   Current Outpatient Medications (Other)  Medication Sig  . acetaminophen (TYLENOL) 325 MG tablet Take 2 tablets (650 mg total) by mouth every 6 (six) hours as needed for mild pain (or Fever >/= 101).  Marland Kitchen amLODipine (NORVASC) 10 MG tablet Take 1 tablet (10 mg total) by mouth daily. For BP  . Ascorbic Acid (VITAMIN C) 1000 MG tablet Take 1,000 mg by mouth daily.  Marland Kitchen aspirin EC 81 MG tablet Take 1 tablet (81 mg total) by mouth daily with breakfast.  . atorvastatin (LIPITOR) 20 MG tablet Take 1 tablet (20 mg total) by mouth every evening.  . cholecalciferol (VITAMIN D) 1000 UNITS tablet Take 1,000 Units by mouth daily.   .  Coenzyme Q10 (COQ-10) 200 MG CAPS Take 200 mg by mouth daily.   . dapagliflozin propanediol (FARXIGA) 10 MG TABS tablet Take 1 tablet (10 mg total) by mouth daily.  Marland Kitchen GNP ULTICARE PEN NEEDLES 31G X 5 MM MISC See admin instructions.  Marland Kitchen levothyroxine (SYNTHROID) 75 MCG tablet Take 1 tablet (75 mcg total) by mouth daily before breakfast.  . metFORMIN (GLUCOPHAGE) 1000 MG tablet Take 1 tablet (1,000 mg total) by mouth 2 (two) times daily with a meal.  . Omega-3 Fatty Acids (FISH OIL) 1200 MG CAPS Take 1,200 mg by mouth every other day.   Nelva Nay SOLOSTAR 300 UNIT/ML SOPN Inject 50 Units into the skin at bedtime.   . TRULICITY 3.66 YQ/0.3KV SOPN Inject 0.75 mg into the skin once a week.   No current facility-administered medications for this visit. (Other)      REVIEW OF SYSTEMS: ROS    Positive for: Gastrointestinal, Endocrine, Cardiovascular, Eyes   Negative for: Constitutional, Neurological, Skin, Genitourinary, Musculoskeletal, HENT, Respiratory, Psychiatric, Allergic/Imm, Heme/Lymph   Last edited by Matthew Folks, COA on 09/15/2020  9:06 AM. (History)       ALLERGIES Allergies  Allergen Reactions  . Penicillins Hives    Has patient had a PCN reaction causing  immediate rash, facial/tongue/throat swelling, SOB or lightheadedness with hypotension: No Has patient had a PCN reaction causing severe rash involving mucus membranes or skin necrosis: Yes Has patient had a PCN reaction that required hospitalization: No Has patient had a PCN reaction occurring within the last 10 years: No If all of the above answers are "NO", then may proceed with Cephalosporin use.    Marland Kitchen Hydrocodone Nausea And Vomiting    PAST MEDICAL HISTORY Past Medical History:  Diagnosis Date  . Anxiety   . Diabetes mellitus   . Diabetic retinopathy ([place])    NPDR OU  . Hyperlipidemia   . Hypertension   . Hypertensive retinopathy    OU  . Thyroid disease    Past Surgical History:  Procedure Laterality Date   . BREAST EXCISIONAL BIOPSY Left   . CATARACT EXTRACTION W/PHACO Left 12/16/2017   Procedure: CATARACT EXTRACTION PHACO AND INTRAOCULAR LENS PLACEMENT (IOC);  Surgeon: [name], MD;  Location: [facility];  Service: Ophthalmology;  Laterality: Left;  CDE: 11.65  . CATARACT EXTRACTION W/PHACO Right 12/30/2017   Procedure: CATARACT EXTRACTION PHACO AND INTRAOCULAR LENS PLACEMENT RIGHT EYE;  Surgeon: [name], MD;  Location: [facility];  Service: Ophthalmology;  Laterality: Right;  CDE: 9.07  . COLONOSCOPY N/A 09/15/2015   Dr. Dudley Major multiple rectal and colonic polyps removed. Hepatic flexure with 9 mm polyp, multiple 5-7 mm polyps. Multiple cecal polyps with largest 1.5 cm, one 5 mm polyp in rectum. Path for colonic polyps with sessile serrated polyp without dysplasia, no high grade dysplasia, tubular adenomas, rectal hyperplastic polyp  . COLONOSCOPY WITH PROPOFOL N/A 07/20/2019   Procedure: COLONOSCOPY WITH PROPOFOL;  Surgeon: [name], MD;  Location: [facility];  Service: Endoscopy;  Laterality: N/A;  8:30am  . EYE SURGERY Bilateral 2019   Cat Sx - Dr. [name]  . LEFT HEART CATHETERIZATION WITH CORONARY ANGIOGRAM N/A 07/23/2011   Procedure: LEFT HEART CATHETERIZATION WITH CORONARY ANGIOGRAM;  Surgeon: [name], MD;  Location: [facility] CATH LAB;  Service: Cardiovascular;  Laterality: N/A;  . POLYPECTOMY  07/20/2019   Procedure: POLYPECTOMY;  Surgeon: [name], MD;  Location: [facility];  Service: Endoscopy;;  . YAG LASER APPLICATION Right 23/36/1224   Dr. [name]  . YAG LASER APPLICATION Left 49/75/3005   Dr. [name]    FAMILY HISTORY Family History  Problem Relation Age of Onset  . Cancer Father   . Diabetes Father   . Colon cancer Neg Hx     SOCIAL HISTORY Social History   Tobacco Use  . Smoking status: Never Smoker  . Smokeless tobacco: Never Used  Substance Use Topics  . Alcohol use: No  . Drug use: No         OPHTHALMIC EXAM:  Base Eye Exam     Visual Acuity (Snellen - Linear)      Right Left   Dist cc 20/25 - 20/25 -2   Dist ph cc NI NI   Correction: Glasses       Tonometry (Tonopen, 9:10 AM)      Right Left   Pressure 18 19       Pupils      Dark Light Shape React APD   Right 3 2 Round Brisk None   Left 3 2 Round Brisk None       Visual Fields (Counting fingers)      Left Right    Full Full       Extraocular Movement  Right Left    Full, Ortho Full, Ortho       Neuro/Psych    Oriented x3: Yes   Mood/Affect: Normal       Dilation    Both eyes: 1.0% Mydriacyl, 2.5% Phenylephrine @ 9:10 AM        Slit Lamp and Fundus Exam    Slit Lamp Exam      Right Left   Lids/Lashes Dermatochalasis - upper lid, mild MGD Dermatochalasis - upper lid, mild MGD   Conjunctiva/Sclera white and quiet white and quiet   Cornea trace PEE, well healed temporal cataract wound trace PEE, well healed temporal cataract wound   Anterior Chamber deep and clear deep and clear   Iris round and dilated round and dilated   Lens PC IOL in good position, open PC PC IOL in good position,1- 2+ PCO   Vitreous syneresis, PVD syneresis       Fundus Exam      Right Left   Disc pink and sharp, compact pink and sharp, compact   C/D Ratio 0.1 0.1   Macula flat, blunted foveal reflex, ERM with stria inferiorly, punctate IRH flat, blunted foveal reflex, mild ERM, rare MA, no edema   Vessels attenuated, tortuous attenuated, tortuous   Periphery attached, scattered MA attached, rare MA/DBH          IMAGING AND PROCEDURES  Imaging and Procedures for 09/15/2020  OCT, Retina - OU - Both Eyes       Right Eye Quality was good. Central Foveal Thickness: 388. Progression has been stable. Findings include abnormal foveal contour, no SRF, intraretinal fluid, epiretinal membrane, macular pucker.   Left Eye Quality was good. Central Foveal Thickness: 268. Progression has been stable. Findings include normal foveal contour, no SRF, epiretinal  membrane, intraretinal fluid (Partial PVD).   Notes *Images captured and stored on drive  Diagnosis / Impression:  OD: ERM with inferior pucker and central cyst OS: mild ERM with early pucker, partial PVD   Clinical management:  See below  Abbreviations: NFP - Normal foveal profile. CME - cystoid macular edema. PED - pigment epithelial detachment. IRF - intraretinal fluid. SRF - subretinal fluid. EZ - ellipsoid zone. ERM - epiretinal membrane. ORA - outer retinal atrophy. ORT - outer retinal tubulation. SRHM - subretinal hyper-reflective material. IRHM - intraretinal hyper-reflective material        Yag Capsulotomy - OS - Left Eye       Procedure note: YAG Capsulotomy, LEFT Eye  Informed consent obtained. Pre-op dilating drops (1% Topicamide and 2.5% Phenylephrine), and topical anesthesia given. Power: 7.2 mJ Shots: 26 Posterior capsulotomy in cruciate formation performed without difficulty. Patient tolerated procedure well. No complications. Rx pred forte 4 times a day for 7 days, then stop. Pt received written and verbal post laser education. Recheck in 2 weeks w/ dilated exam.               ASSESSMENT/PLAN:    ICD-10-CM   1. Epiretinal membrane (ERM) of both eyes  H35.373   2. Retinal edema  H35.81 OCT, Retina - OU - Both Eyes  3. Moderate nonproliferative diabetic retinopathy of both eyes without macular edema associated with type 2 diabetes mellitus ([place])  O67.6720   4. Essential hypertension  I10   5. Hypertensive retinopathy of both eyes  H35.033   6. Pseudophakia of both eyes  Z96.1   7. PCO (posterior capsular opacification), bilateral  H26.493 Yag Capsulotomy - OS - Left Eye   1,2.  Epiretinal membrane OU  - BCVA OD 20/25 -- improved post Yag Cap OS from 20/60  - OCT shows ERM with macular pucker OD, mild ERM OS  - blurred vision, no metamorphopsia  - monitor for now  3. Mild nonproliferative diabetic retinopathy OU - The incidence, risk factors  for progression, natural history and treatment options for diabetic retinopathy were discussed with patient.   - The need for close monitoring of blood glucose, blood pressure, and serum lipids, avoiding cigarette or any type of tobacco, and the need for long term follow up was also discussed with patient. - BCVA OD: 20/25, OS: 20/20-2 - exam shows scattered MA, no NV - OCT without diabetic macular edema OU - f/u in 2-3 mos -- DFE/OCT  4,5. Hypertensive retinopathy OU - discussed importance of tight BP control - monitor   6. Pseudophakia OU  - s/p CE/IOL OU, 2019 (Dr. Geoffry Paradise)  - IOLs in good position, doing well  - monitor  7. PCO OU  - s/p YAG cap OD 3.24.22  - BCVA OD improved to 20/25 from 20/60  - recommend YAG cap OS today 04.07.22  - pt wishes to proceed with YAG capsulotomy OS  - RBA of procedure discussed, questions answered  - informed consent obtained and signed  - see procedure note  - start PF qid OS x 7 days  - return in 2 weeks for post yag check  Ophthalmic Meds Ordered this visit:  No orders of the defined types were placed in this encounter.      Return in about 2 weeks (around 09/29/2020) for POV - post YAG OU.  There are no Patient Instructions on file for this visit.   Explained the diagnoses, plan, and follow up with the patient and they expressed understanding.  Patient expressed understanding of the importance of proper follow up care.   This document serves as a record of services personally performed by Gardiner Sleeper, MD, PhD. It was created on their behalf by Estill Bakes, COT an ophthalmic technician. The creation of this record is the provider's dictation and/or activities during the visit.    Electronically signed by: Estill Bakes, COT 4.5.22 @ 1:21 PM  Gardiner Sleeper, M.D., Ph.D. Diseases & Surgery of the Retina and [place] 09/15/2020   I have reviewed the above documentation for accuracy and  completeness, and I agree with the above. Gardiner Sleeper, M.D., Ph.D. 09/15/20 1:21 PM  Abbreviations: M myopia (nearsighted); A astigmatism; H hyperopia (farsighted); P presbyopia; Mrx spectacle prescription;  CTL contact lenses; OD right eye; OS left eye; OU both eyes  XT exotropia; ET esotropia; PEK punctate epithelial keratitis; PEE punctate epithelial erosions; DES dry eye syndrome; MGD meibomian gland dysfunction; ATs artificial tears; PFAT's preservative free artificial tears; [place] nuclear sclerotic cataract; PSC posterior subcapsular cataract; ERM epi-retinal membrane; PVD posterior vitreous detachment; RD retinal detachment; DM diabetes mellitus; DR diabetic retinopathy; NPDR non-proliferative diabetic retinopathy; PDR proliferative diabetic retinopathy; CSME clinically significant macular edema; DME diabetic macular edema; dbh dot blot hemorrhages; CWS cotton wool spot; POAG primary open angle glaucoma; C/D cup-to-disc ratio; HVF humphrey visual field; GVF goldmann visual field; OCT optical coherence tomography; IOP intraocular pressure; BRVO Branch retinal vein occlusion; CRVO central retinal vein occlusion; CRAO central retinal artery occlusion; BRAO branch retinal artery occlusion; RT retinal tear; SB scleral buckle; PPV pars plana vitrectomy; VH Vitreous hemorrhage; PRP panretinal laser photocoagulation; IVK intravitreal kenalog; VMT vitreomacular traction; MH Macular hole;  NVD neovascularization of the disc; NVE neovascularization elsewhere; AREDS age related eye disease study; ARMD age related macular degeneration; POAG primary open angle glaucoma; EBMD epithelial/anterior basement membrane dystrophy; ACIOL anterior chamber intraocular lens; IOL intraocular lens; PCIOL posterior chamber intraocular lens; Phaco/IOL phacoemulsification with intraocular lens placement; [place] photorefractive keratectomy; LASIK laser assisted in situ keratomileusis; HTN hypertension; DM diabetes mellitus; COPD chronic  obstructive pulmonary disease

## 2020-09-15 ENCOUNTER — Ambulatory Visit (INDEPENDENT_AMBULATORY_CARE_PROVIDER_SITE_OTHER): Payer: Medicare Other | Admitting: Ophthalmology

## 2020-09-15 ENCOUNTER — Other Ambulatory Visit: Payer: Self-pay

## 2020-09-15 ENCOUNTER — Encounter (INDEPENDENT_AMBULATORY_CARE_PROVIDER_SITE_OTHER): Payer: Self-pay | Admitting: Ophthalmology

## 2020-09-15 DIAGNOSIS — H35373 Puckering of macula, bilateral: Secondary | ICD-10-CM

## 2020-09-15 DIAGNOSIS — Z961 Presence of intraocular lens: Secondary | ICD-10-CM

## 2020-09-15 DIAGNOSIS — H3581 Retinal edema: Secondary | ICD-10-CM | POA: Diagnosis not present

## 2020-09-15 DIAGNOSIS — H26493 Other secondary cataract, bilateral: Secondary | ICD-10-CM | POA: Diagnosis not present

## 2020-09-15 DIAGNOSIS — E113393 Type 2 diabetes mellitus with moderate nonproliferative diabetic retinopathy without macular edema, bilateral: Secondary | ICD-10-CM | POA: Diagnosis not present

## 2020-09-15 DIAGNOSIS — H35033 Hypertensive retinopathy, bilateral: Secondary | ICD-10-CM

## 2020-09-15 DIAGNOSIS — I10 Essential (primary) hypertension: Secondary | ICD-10-CM | POA: Diagnosis not present

## 2020-10-03 NOTE — Progress Notes (Signed)
[facility] Note  10/06/2020     CHIEF COMPLAINT Patient presents for Retina Follow Up   HISTORY OF PRESENT ILLNESS: Megan Schroeder is a 75 y.o. female who presents to the clinic today for:   HPI    Retina Follow Up    Patient presents with  Other.  In both eyes.  This started 3 weeks ago.  I, the attending physician,  performed the HPI with the patient and updated documentation appropriately.          Comments    Patient here for 3 weeks retina follow up for s/p yag OU. Patient states vision doing good. No eye pain.       Last edited by [name], MD on 10/06/2020 10:59 PM. (History)    pt states vision improved in the left eye after YAG, she states she had floaters for one afternoon afterwards, but they cleared up, she used PF for 7 days as directed  Referring physician: Lucia Gaskins, MD [place],  [place] [id number]  HISTORICAL INFORMATION:   Selected notes from the MEDICAL RECORD NUMBER Referred by Dr. [name]   CURRENT MEDICATIONS: No current outpatient medications on file. (Ophthalmic Drugs)   No current facility-administered medications for this visit. (Ophthalmic Drugs)   Current Outpatient Medications (Other)  Medication Sig  . acetaminophen (TYLENOL) 325 MG tablet Take 2 tablets (650 mg total) by mouth every 6 (six) hours as needed for mild pain (or Fever >/= 101).  Marland Kitchen amLODipine (NORVASC) 10 MG tablet Take 1 tablet (10 mg total) by mouth daily. For BP  . Ascorbic Acid (VITAMIN C) 1000 MG tablet Take 1,000 mg by mouth daily.  Marland Kitchen aspirin EC 81 MG tablet Take 1 tablet (81 mg total) by mouth daily with breakfast.  . atorvastatin (LIPITOR) 20 MG tablet Take 1 tablet (20 mg total) by mouth every evening.  . cholecalciferol (VITAMIN D) 1000 UNITS tablet Take 1,000 Units by mouth daily.   . Coenzyme Q10 (COQ-10) 200 MG CAPS Take 200 mg by mouth daily.   . dapagliflozin propanediol (FARXIGA) 10 MG TABS tablet Take 1  tablet (10 mg total) by mouth daily.  Marland Kitchen GNP ULTICARE PEN NEEDLES 31G X 5 MM MISC See admin instructions.  Marland Kitchen levothyroxine (SYNTHROID) 75 MCG tablet Take 1 tablet (75 mcg total) by mouth daily before breakfast.  . metFORMIN (GLUCOPHAGE) 1000 MG tablet Take 1 tablet (1,000 mg total) by mouth 2 (two) times daily with a meal.  . Omega-3 Fatty Acids (FISH OIL) 1200 MG CAPS Take 1,200 mg by mouth every other day.   Nelva Nay SOLOSTAR 300 UNIT/ML SOPN Inject 50 Units into the skin at bedtime.   . TRULICITY [id number] SOPN Inject 0.75 mg into the skin once a week.   No current facility-administered medications for this visit. (Other)      REVIEW OF SYSTEMS: ROS    Positive for: Gastrointestinal, Endocrine, Cardiovascular, Eyes   Negative for: Constitutional, Neurological, Skin, Genitourinary, Musculoskeletal, HENT, Respiratory, Psychiatric, Allergic/Imm, Heme/Lymph   Last edited by Theodore Demark, COA on 10/06/2020  8:12 AM. (History)       ALLERGIES Allergies  Allergen Reactions  . Penicillins Hives    Has patient had a PCN reaction causing immediate rash, facial/tongue/throat swelling, SOB or lightheadedness with hypotension: No Has patient had a PCN reaction causing severe rash involving mucus membranes or skin necrosis: Yes Has patient had a PCN reaction that required hospitalization: No  Has patient had a PCN reaction occurring within the last 10 years: No If all of the above answers are "NO", then may proceed with Cephalosporin use.    Marland Kitchen Hydrocodone Nausea And Vomiting    PAST MEDICAL HISTORY Past Medical History:  Diagnosis Date  . Anxiety   . Diabetes mellitus   . Diabetic retinopathy ([place])    NPDR OU  . Hyperlipidemia   . Hypertension   . Hypertensive retinopathy    OU  . Thyroid disease    Past Surgical History:  Procedure Laterality Date  . BREAST EXCISIONAL BIOPSY Left   . CATARACT EXTRACTION W/PHACO Left 12/16/2017   Procedure: CATARACT EXTRACTION PHACO AND  INTRAOCULAR LENS PLACEMENT (IOC);  Surgeon: [name], MD;  Location: [facility];  Service: Ophthalmology;  Laterality: Left;  CDE: 11.65  . CATARACT EXTRACTION W/PHACO Right 12/30/2017   Procedure: CATARACT EXTRACTION PHACO AND INTRAOCULAR LENS PLACEMENT RIGHT EYE;  Surgeon: [name], MD;  Location: [facility];  Service: Ophthalmology;  Laterality: Right;  CDE: 9.07  . COLONOSCOPY N/A 09/15/2015   Dr. Dudley Major multiple rectal and colonic polyps removed. Hepatic flexure with 9 mm polyp, multiple 5-7 mm polyps. Multiple cecal polyps with largest 1.5 cm, one 5 mm polyp in rectum. Path for colonic polyps with sessile serrated polyp without dysplasia, no high grade dysplasia, tubular adenomas, rectal hyperplastic polyp  . COLONOSCOPY WITH PROPOFOL N/A 07/20/2019   Procedure: COLONOSCOPY WITH PROPOFOL;  Surgeon: [name], MD;  Location: [facility];  Service: Endoscopy;  Laterality: N/A;  8:30am  . EYE SURGERY Bilateral 2019   Cat Sx - Dr. [name]  . LEFT HEART CATHETERIZATION WITH CORONARY ANGIOGRAM N/A 07/23/2011   Procedure: LEFT HEART CATHETERIZATION WITH CORONARY ANGIOGRAM;  Surgeon: [name], MD;  Location: [facility] CATH LAB;  Service: Cardiovascular;  Laterality: N/A;  . POLYPECTOMY  07/20/2019   Procedure: POLYPECTOMY;  Surgeon: [name], MD;  Location: [facility];  Service: Endoscopy;;  . YAG LASER APPLICATION Right 96/22/2979   Dr. [name]  . YAG LASER APPLICATION Left 89/21/1941   Dr. [name]    FAMILY HISTORY Family History  Problem Relation Age of Onset  . Cancer Father   . Diabetes Father   . Colon cancer Neg Hx     SOCIAL HISTORY Social History   Tobacco Use  . Smoking status: Never Smoker  . Smokeless tobacco: Never Used  Substance Use Topics  . Alcohol use: No  . Drug use: No         OPHTHALMIC EXAM:  Base Eye Exam    Visual Acuity (Snellen - Linear)      Right Left   Dist cc 20/25 +1 20/20 -1   Dist ph cc NI    Correction: Glasses        Tonometry (Tonopen, 8:09 AM)      Right Left   Pressure 18 16       Pupils      Dark Light Shape React APD   Right 3 2 Round Brisk None   Left 3 2 Round Brisk None       Visual Fields (Counting fingers)      Left Right    Full Full       Extraocular Movement      Right Left    Full, Ortho Full, Ortho       Neuro/Psych    Oriented x3: Yes   Mood/Affect: Normal  Dilation    Both eyes: 1.0% Mydriacyl, 2.5% Phenylephrine @ 8:09 AM        Slit Lamp and Fundus Exam    Slit Lamp Exam      Right Left   Lids/Lashes Dermatochalasis - upper lid, mild MGD Dermatochalasis - upper lid, mild MGD   Conjunctiva/Sclera white and quiet white and quiet   Cornea trace PEE, well healed temporal cataract wound 2+PEE, well healed temporal cataract wound, decreased TBUT   Anterior Chamber deep and clear deep and clear   Iris round and dilated round and dilated   Lens PC IOL in good position, open PC PC IOL in good position with open PC   Vitreous syneresis, PVD syneresis       Fundus Exam      Right Left   Disc pink and sharp, compact, mild PPA pink and sharp, compact, mild PPP   C/D Ratio 0.1 0.1   Macula flat, blunted foveal reflex, ERM with striae inferiorly, punctate IRH, +central cyst flat, blunted foveal reflex, mild ERM, rare MA, no edema   Vessels attenuated, tortuous attenuated, tortuous   Periphery attached, scattered MA attached, rare MA/DBH        Refraction    Wearing Rx      Sphere Cylinder Axis Add   Right [place]   +2.50   Left -0.25 +0.50 177 +2.50          IMAGING AND PROCEDURES  Imaging and Procedures for 10/06/2020  OCT, Retina - OU - Both Eyes       Right Eye Quality was good. Central Foveal Thickness: 392. Progression has been stable. Findings include abnormal foveal contour, no SRF, intraretinal fluid, epiretinal membrane, macular pucker.   Left Eye Quality was good. Central Foveal Thickness: 277. Progression has been stable. Findings include  normal foveal contour, no SRF, epiretinal membrane, intraretinal fluid (Partial PVD).   Notes *Images captured and stored on drive  Diagnosis / Impression:  OD: ERM with inferior pucker and central cyst -- no significant change from prior OS: mild ERM with early pucker, partial PVD   Clinical management:  See below  Abbreviations: NFP - Normal foveal profile. CME - cystoid macular edema. PED - pigment epithelial detachment. IRF - intraretinal fluid. SRF - subretinal fluid. EZ - ellipsoid zone. ERM - epiretinal membrane. ORA - outer retinal atrophy. ORT - outer retinal tubulation. SRHM - subretinal hyper-reflective material. IRHM - intraretinal hyper-reflective material               ASSESSMENT/PLAN:    ICD-10-CM   1. Epiretinal membrane (ERM) of both eyes  H35.373   2. Retinal edema  H35.81 OCT, Retina - OU - Both Eyes  3. Moderate nonproliferative diabetic retinopathy of both eyes without macular edema associated with type 2 diabetes mellitus ([place])  W73.7106   4. Essential hypertension  I10   5. Hypertensive retinopathy of both eyes  H35.033   6. Pseudophakia of both eyes  Z96.1   7. PCO (posterior capsular opacification), bilateral  H26.493    1,2. Epiretinal membrane OU  - BCVA OD 20/25 -- improved post Yag Cap from 20/60  - OCT shows ERM with macular pucker OD, mild ERM OS  - blurred vision, no metamorphopsia  - monitor for now  - f/u 3 mos -- DFE/OCT  3. Mild nonproliferative diabetic retinopathy OU - BCVA OD: 20/25, OS: 20/20-2 - exam shows scattered MA, no NV - OCT without diabetic macular edema OU - f/u in 2-3  mos -- DFE/OCT  4,5. Hypertensive retinopathy OU - discussed importance of tight BP control - monitor   6. Pseudophakia OU  - s/p CE/IOL OU, 2019 (Dr. Geoffry Paradise)  - IOLs in good position, doing well  - monitor  7. PCO OU  - s/p YAG cap OD 3.24.22  - s/p YAG cap OS 04.07.22  - BCVA OD stable at 20/25; OS improved from 20/25 to 20/20 post YAG  - return  in 3 months  Ophthalmic Meds Ordered this visit:  No orders of the defined types were placed in this encounter.      Return in about 3 months (around 01/05/2021) for f/u ERM OU, DFE, OCT.  There are no Patient Instructions on file for this visit.  This document serves as a record of services personally performed by Gardiner Sleeper, MD, PhD. It was created on their behalf by Leeann Must, [place], an ophthalmic technician. The creation of this record is the provider's dictation and/or activities during the visit.    Electronically signed by: Leeann Must, COA @TODAY @ 11:03 PM   This document serves as a record of services personally performed by Gardiner Sleeper, MD, PhD. It was created on their behalf by San Jetty. Owens Shark, OA an ophthalmic technician. The creation of this record is the provider's dictation and/or activities during the visit.    Electronically signed by: San Jetty. Owens Shark, [place] 04.28.2022 11:03 PM  Gardiner Sleeper, M.D., Ph.D. Diseases & Surgery of the Retina and [place] 10/06/2020   I have reviewed the above documentation for accuracy and completeness, and I agree with the above. Gardiner Sleeper, M.D., Ph.D. 10/06/20 11:03 PM   Abbreviations: M myopia (nearsighted); A astigmatism; H hyperopia (farsighted); P presbyopia; Mrx spectacle prescription;  CTL contact lenses; OD right eye; OS left eye; OU both eyes  XT exotropia; ET esotropia; PEK punctate epithelial keratitis; PEE punctate epithelial erosions; DES dry eye syndrome; MGD meibomian gland dysfunction; ATs artificial tears; PFAT's preservative free artificial tears; [place] nuclear sclerotic cataract; PSC posterior subcapsular cataract; ERM epi-retinal membrane; PVD posterior vitreous detachment; RD retinal detachment; DM diabetes mellitus; DR diabetic retinopathy; NPDR non-proliferative diabetic retinopathy; PDR proliferative diabetic retinopathy; CSME clinically significant macular edema;  DME diabetic macular edema; dbh dot blot hemorrhages; CWS cotton wool spot; POAG primary open angle glaucoma; C/D cup-to-disc ratio; HVF humphrey visual field; GVF goldmann visual field; OCT optical coherence tomography; IOP intraocular pressure; BRVO Branch retinal vein occlusion; CRVO central retinal vein occlusion; CRAO central retinal artery occlusion; BRAO branch retinal artery occlusion; RT retinal tear; SB scleral buckle; PPV pars plana vitrectomy; VH Vitreous hemorrhage; PRP panretinal laser photocoagulation; IVK intravitreal kenalog; VMT vitreomacular traction; MH Macular hole;  NVD neovascularization of the disc; NVE neovascularization elsewhere; AREDS age related eye disease study; ARMD age related macular degeneration; POAG primary open angle glaucoma; EBMD epithelial/anterior basement membrane dystrophy; ACIOL anterior chamber intraocular lens; IOL intraocular lens; PCIOL posterior chamber intraocular lens; Phaco/IOL phacoemulsification with intraocular lens placement; [place] photorefractive keratectomy; LASIK laser assisted in situ keratomileusis; HTN hypertension; DM diabetes mellitus; COPD chronic obstructive pulmonary disease

## 2020-10-06 ENCOUNTER — Other Ambulatory Visit: Payer: Self-pay

## 2020-10-06 ENCOUNTER — Ambulatory Visit (INDEPENDENT_AMBULATORY_CARE_PROVIDER_SITE_OTHER): Payer: Medicare Other | Admitting: Ophthalmology

## 2020-10-06 ENCOUNTER — Encounter (INDEPENDENT_AMBULATORY_CARE_PROVIDER_SITE_OTHER): Payer: Self-pay | Admitting: Ophthalmology

## 2020-10-06 DIAGNOSIS — Z961 Presence of intraocular lens: Secondary | ICD-10-CM

## 2020-10-06 DIAGNOSIS — E113393 Type 2 diabetes mellitus with moderate nonproliferative diabetic retinopathy without macular edema, bilateral: Secondary | ICD-10-CM | POA: Diagnosis not present

## 2020-10-06 DIAGNOSIS — H3581 Retinal edema: Secondary | ICD-10-CM

## 2020-10-06 DIAGNOSIS — H35373 Puckering of macula, bilateral: Secondary | ICD-10-CM

## 2020-10-06 DIAGNOSIS — H26493 Other secondary cataract, bilateral: Secondary | ICD-10-CM

## 2020-10-06 DIAGNOSIS — I10 Essential (primary) hypertension: Secondary | ICD-10-CM | POA: Diagnosis not present

## 2020-10-06 DIAGNOSIS — H35033 Hypertensive retinopathy, bilateral: Secondary | ICD-10-CM | POA: Diagnosis not present

## 2020-10-18 DIAGNOSIS — L84 Corns and callosities: Secondary | ICD-10-CM | POA: Diagnosis not present

## 2020-10-18 DIAGNOSIS — B351 Tinea unguium: Secondary | ICD-10-CM | POA: Diagnosis not present

## 2020-10-18 DIAGNOSIS — M79676 Pain in unspecified toe(s): Secondary | ICD-10-CM | POA: Diagnosis not present

## 2020-10-18 DIAGNOSIS — E1151 Type 2 diabetes mellitus with diabetic peripheral angiopathy without gangrene: Secondary | ICD-10-CM | POA: Diagnosis not present

## 2020-12-01 DIAGNOSIS — E1165 Type 2 diabetes mellitus with hyperglycemia: Secondary | ICD-10-CM | POA: Diagnosis not present

## 2020-12-01 DIAGNOSIS — R5383 Other fatigue: Secondary | ICD-10-CM | POA: Diagnosis not present

## 2020-12-01 DIAGNOSIS — R5382 Chronic fatigue, unspecified: Secondary | ICD-10-CM | POA: Diagnosis not present

## 2020-12-01 DIAGNOSIS — E7849 Other hyperlipidemia: Secondary | ICD-10-CM | POA: Diagnosis not present

## 2020-12-27 DIAGNOSIS — M79676 Pain in unspecified toe(s): Secondary | ICD-10-CM | POA: Diagnosis not present

## 2020-12-27 DIAGNOSIS — E1151 Type 2 diabetes mellitus with diabetic peripheral angiopathy without gangrene: Secondary | ICD-10-CM | POA: Diagnosis not present

## 2020-12-27 DIAGNOSIS — L84 Corns and callosities: Secondary | ICD-10-CM | POA: Diagnosis not present

## 2020-12-27 DIAGNOSIS — B351 Tinea unguium: Secondary | ICD-10-CM | POA: Diagnosis not present

## 2021-01-05 ENCOUNTER — Encounter (INDEPENDENT_AMBULATORY_CARE_PROVIDER_SITE_OTHER): Payer: Medicare Other | Admitting: Ophthalmology

## 2021-01-10 NOTE — Progress Notes (Signed)
[facility] Note  01/12/2021     CHIEF COMPLAINT Patient presents for Retina Follow Up   HISTORY OF PRESENT ILLNESS: Megan Schroeder is a 75 y.o. female who presents to the clinic today for:   HPI     Retina Follow Up   Patient presents with  Other.  In both eyes.  This started 3 months ago.  I, the attending physician,  performed the HPI with the patient and updated documentation appropriately.        Comments   Patient here for 3 months retina follow up for ERM OU. Patient states vision doing pretty good. No eye pain.       Last edited by [name], MD on 01/12/2021 10:44 AM.    pt states vision is doing well  Referring physician: Lucia Gaskins, MD [place],  [place] [id number]  HISTORICAL INFORMATION:   Selected notes from the MEDICAL RECORD NUMBER Referred by Dr. [name]   CURRENT MEDICATIONS: No current outpatient medications on file. (Ophthalmic Drugs)   No current facility-administered medications for this visit. (Ophthalmic Drugs)   Current Outpatient Medications (Other)  Medication Sig   acetaminophen (TYLENOL) 325 MG tablet Take 2 tablets (650 mg total) by mouth every 6 (six) hours as needed for mild pain (or Fever >/= 101).   amLODipine (NORVASC) 10 MG tablet Take 1 tablet (10 mg total) by mouth daily. For BP   Ascorbic Acid (VITAMIN C) 1000 MG tablet Take 1,000 mg by mouth daily.   aspirin EC 81 MG tablet Take 1 tablet (81 mg total) by mouth daily with breakfast.   atorvastatin (LIPITOR) 20 MG tablet Take 1 tablet (20 mg total) by mouth every evening.   cholecalciferol (VITAMIN D) 1000 UNITS tablet Take 1,000 Units by mouth daily.    Coenzyme Q10 (COQ-10) 200 MG CAPS Take 200 mg by mouth daily.    dapagliflozin propanediol (FARXIGA) 10 MG TABS tablet Take 1 tablet (10 mg total) by mouth daily.   GNP ULTICARE PEN NEEDLES 31G X 5 MM MISC See admin instructions.   levothyroxine (SYNTHROID) 75 MCG tablet Take  1 tablet (75 mcg total) by mouth daily before breakfast.   metFORMIN (GLUCOPHAGE) 1000 MG tablet Take 1 tablet (1,000 mg total) by mouth 2 (two) times daily with a meal.   Omega-3 Fatty Acids (FISH OIL) 1200 MG CAPS Take 1,200 mg by mouth every other day.    TOUJEO SOLOSTAR 300 UNIT/ML SOPN Inject 50 Units into the skin at bedtime.    TRULICITY [id number] [id number] SOPN Inject 0.75 mg into the skin once a week.   No current facility-administered medications for this visit. (Other)      REVIEW OF SYSTEMS: ROS   Positive for: Gastrointestinal, Endocrine, Cardiovascular, Eyes Negative for: Constitutional, Neurological, Skin, Genitourinary, Musculoskeletal, HENT, Respiratory, Psychiatric, Allergic/Imm, Heme/Lymph Last edited by Theodore Demark, COA on 01/12/2021  8:13 AM.        ALLERGIES Allergies  Allergen Reactions   Penicillins Hives    Has patient had a PCN reaction causing immediate rash, facial/tongue/throat swelling, SOB or lightheadedness with hypotension: No Has patient had a PCN reaction causing severe rash involving mucus membranes or skin necrosis: Yes Has patient had a PCN reaction that required hospitalization: No Has patient had a PCN reaction occurring within the last 10 years: No If all of the above answers are "NO", then may proceed with Cephalosporin use.     Hydrocodone Nausea And  Vomiting    PAST MEDICAL HISTORY Past Medical History:  Diagnosis Date   Anxiety    Diabetes mellitus    Diabetic retinopathy ([place])    NPDR OU   Hyperlipidemia    Hypertension    Hypertensive retinopathy    OU   Thyroid disease    Past Surgical History:  Procedure Laterality Date   BREAST EXCISIONAL BIOPSY Left    CATARACT EXTRACTION W/PHACO Left 12/16/2017   Procedure: CATARACT EXTRACTION PHACO AND INTRAOCULAR LENS PLACEMENT ([place]);  Surgeon: [name], MD;  Location: [facility];  Service: Ophthalmology;  Laterality: Left;  CDE: 11.65   CATARACT EXTRACTION W/PHACO Right 12/30/2017    Procedure: CATARACT EXTRACTION PHACO AND INTRAOCULAR LENS PLACEMENT RIGHT EYE;  Surgeon: [name], MD;  Location: [facility];  Service: Ophthalmology;  Laterality: Right;  CDE: 9.07   COLONOSCOPY N/A 09/15/2015   Dr. Dudley Major multiple rectal and colonic polyps removed. Hepatic flexure with 9 mm polyp, multiple 5-7 mm polyps. Multiple cecal polyps with largest 1.5 cm, one 5 mm polyp in rectum. Path for colonic polyps with sessile serrated polyp without dysplasia, no high grade dysplasia, tubular adenomas, rectal hyperplastic polyp   COLONOSCOPY WITH PROPOFOL N/A 07/20/2019   Procedure: COLONOSCOPY WITH PROPOFOL;  Surgeon: [name], MD;  Location: [facility];  Service: Endoscopy;  Laterality: N/A;  8:30am   EYE SURGERY Bilateral 2019   Cat Sx - Dr. [name]   LEFT HEART CATHETERIZATION WITH CORONARY ANGIOGRAM N/A 07/23/2011   Procedure: LEFT HEART CATHETERIZATION WITH CORONARY ANGIOGRAM;  Surgeon: [name], MD;  [facility] LAB;  Service: Cardiovascular;  Laterality: N/A;   POLYPECTOMY  07/20/2019   Procedure: POLYPECTOMY;  Surgeon: [name], MD;  [facility];  Service: Endoscopy;;   YAG LASER APPLICATION Right [id number]   Dr. [name]   YAG LASER APPLICATION Left [id number]   Dr. [name]    FAMILY HISTORY Family History  Problem Relation Age of Onset   Cancer Father    Diabetes Father    Colon cancer Neg Hx     SOCIAL HISTORY Social History   Tobacco Use   Smoking status: Never   Smokeless tobacco: Never  Substance Use Topics   Alcohol use: No   Drug use: No         OPHTHALMIC EXAM:  Base Eye Exam     Visual Acuity (Snellen - Linear)       Right Left   Dist cc 20/25 -2 20/20   Dist ph cc NI     Correction: Glasses         Tonometry (Tonopen, 8:11 AM)       Right Left   Pressure 18 19         Pupils       Dark Light Shape React APD   Right 3 2 Round Brisk None   Left 3 2 Round Brisk None         Visual  Fields (Counting fingers)       Left Right    Full Full         Extraocular Movement       Right Left    Full, Ortho Full, Ortho         Neuro/Psych     Oriented x3: Yes   Mood/Affect: Normal         Dilation     Both eyes: 1.0% Mydriacyl, 2.5% Phenylephrine @ 8:11 AM  Slit Lamp and Fundus Exam     Slit Lamp Exam       Right Left   Lids/Lashes Dermatochalasis - upper lid, mild MGD Dermatochalasis - upper lid, mild MGD   Conjunctiva/Sclera white and quiet white and quiet   Cornea trace PEE, well healed temporal cataract wound 2+PEE, well healed temporal cataract wound, decreased TBUT   Anterior Chamber deep and clear deep and clear   Iris round and dilated round and dilated   Lens PC IOL in good position, open PC PC IOL in good position with open PC   Vitreous syneresis, PVD syneresis         Fundus Exam       Right Left   Disc pink and sharp, compact, mild PPA, small heme at 0300 pink and sharp, compact, mild PPP   C/D Ratio 0.1 0.1   Macula flat, blunted foveal reflex, ERM with striae inferiorly, punctate IRH, +central cyst flat, blunted foveal reflex, mild ERM, rare MA, no edema   Vessels attenuated, tortuous attenuated, tortuous   Periphery attached, scattered MA attached, rare MA/DBH           Refraction     Wearing Rx       Sphere Cylinder Axis Add   Right [place]   +2.50   Left -0.25 +0.50 177 +2.50            IMAGING AND PROCEDURES  Imaging and Procedures for 01/12/2021  OCT, Retina - OU - Both Eyes       Right Eye Quality was good. Central Foveal Thickness: 394. Progression has been stable. Findings include abnormal foveal contour, no SRF, intraretinal fluid, epiretinal membrane, macular pucker (ERM with inferior pucker and central cyst -- no significant change from prior).   Left Eye Quality was good. Central Foveal Thickness: 267. Progression has been stable. Findings include normal foveal contour, no SRF,  epiretinal membrane, no IRF (Partial PVD).   Notes *Images captured and stored on drive  Diagnosis / Impression:  OD: ERM with inferior pucker and central cyst -- no significant change from prior OS: mild ERM with early pucker, partial PVD   Clinical management:  See below  Abbreviations: NFP - Normal foveal profile. CME - cystoid macular edema. PED - pigment epithelial detachment. IRF - intraretinal fluid. SRF - subretinal fluid. EZ - ellipsoid zone. ERM - epiretinal membrane. ORA - outer retinal atrophy. ORT - outer retinal tubulation. SRHM - subretinal hyper-reflective material. IRHM - intraretinal hyper-reflective material             ASSESSMENT/PLAN:    ICD-10-CM   1. Epiretinal membrane (ERM) of both eyes  H35.373     2. Retinal edema  H35.81 OCT, Retina - OU - Both Eyes    3. Moderate nonproliferative diabetic retinopathy of both eyes without macular edema associated with type 2 diabetes mellitus ([place])  [id number]     4. Essential hypertension  I10     5. Hypertensive retinopathy of both eyes  H35.033     6. Pseudophakia of both eyes  Z96.1       1,2. Epiretinal membrane OU -- stable  - BCVA OD 20/25; OS 20/20 -- stable  - OCT shows ERM with macular pucker OD, mild ERM OS -- no change from prior  - no metamorphopsia  - monitor for now   - f/u 4 mos -- DFE/OCT  3. Mild nonproliferative diabetic retinopathy OU - BCVA OD: 20/25, OS: 20/20-2 - exam shows scattered MA,  no NV - OCT without diabetic macular edema OU - f/u in 4 mos -- DFE/OCT  4,5. Hypertensive retinopathy OU - discussed importance of tight BP control - monitor   6. Pseudophakia OU  - s/p CE/IOL OU, 2019 (Dr. Geoffry Paradise)  - IOLs in good position, doing well  - s/p YAG cap OD 3.24.22  - s/p YAG cap OS 04.07.22  - monitor   Ophthalmic Meds Ordered this visit:  No orders of the defined types were placed in this encounter.      Return in about 4 months (around 05/14/2021) for f/u ERM OU, DFE,  OCT.  There are no Patient Instructions on file for this visit.  This document serves as a record of services personally performed by Gardiner Sleeper, MD, PhD. It was created on their behalf by Leonie Douglas, an ophthalmic technician. The creation of this record is the provider's dictation and/or activities during the visit.    Electronically signed by: Leonie Douglas COA, 01/12/21  10:47 AM  This document serves as a record of services personally performed by Gardiner Sleeper, MD, PhD. It was created on their behalf by San Jetty. Owens Shark, OA an ophthalmic technician. The creation of this record is the provider's dictation and/or activities during the visit.    Electronically signed by: San Jetty. Marguerita Merles 08.04.2022 10:47 AM  Gardiner Sleeper, M.D., Ph.D. Diseases & Surgery of the Retina and [place] 01/12/2021   I have reviewed the above documentation for accuracy and completeness, and I agree with the above. Gardiner Sleeper, M.D., Ph.D. 01/12/21 10:47 AM  Abbreviations: M myopia (nearsighted); A astigmatism; H hyperopia (farsighted); P presbyopia; Mrx spectacle prescription;  CTL contact lenses; OD right eye; OS left eye; OU both eyes  XT exotropia; ET esotropia; PEK punctate epithelial keratitis; PEE punctate epithelial erosions; DES dry eye syndrome; MGD meibomian gland dysfunction; ATs artificial tears; PFAT's preservative free artificial tears; [place] nuclear sclerotic cataract; PSC posterior subcapsular cataract; ERM epi-retinal membrane; PVD posterior vitreous detachment; RD retinal detachment; DM diabetes mellitus; DR diabetic retinopathy; NPDR non-proliferative diabetic retinopathy; PDR proliferative diabetic retinopathy; CSME clinically significant macular edema; DME diabetic macular edema; dbh dot blot hemorrhages; CWS cotton wool spot; POAG primary open angle glaucoma; C/D cup-to-disc ratio; HVF humphrey visual field; GVF goldmann visual field; OCT optical  coherence tomography; IOP intraocular pressure; BRVO Branch retinal vein occlusion; CRVO central retinal vein occlusion; CRAO central retinal artery occlusion; BRAO branch retinal artery occlusion; RT retinal tear; SB scleral buckle; PPV pars plana vitrectomy; VH Vitreous hemorrhage; PRP panretinal laser photocoagulation; IVK intravitreal kenalog; VMT vitreomacular traction; MH Macular hole;  NVD neovascularization of the disc; NVE neovascularization elsewhere; AREDS age related eye disease study; ARMD age related macular degeneration; POAG primary open angle glaucoma; EBMD epithelial/anterior basement membrane dystrophy; ACIOL anterior chamber intraocular lens; IOL intraocular lens; PCIOL posterior chamber intraocular lens; Phaco/IOL phacoemulsification with intraocular lens placement; [place] photorefractive keratectomy; LASIK laser assisted in situ keratomileusis; HTN hypertension; DM diabetes mellitus; COPD chronic obstructive pulmonary disease

## 2021-01-12 ENCOUNTER — Other Ambulatory Visit: Payer: Self-pay

## 2021-01-12 ENCOUNTER — Encounter (INDEPENDENT_AMBULATORY_CARE_PROVIDER_SITE_OTHER): Payer: Self-pay | Admitting: Ophthalmology

## 2021-01-12 ENCOUNTER — Ambulatory Visit (INDEPENDENT_AMBULATORY_CARE_PROVIDER_SITE_OTHER): Payer: Medicare Other | Admitting: Ophthalmology

## 2021-01-12 DIAGNOSIS — H3581 Retinal edema: Secondary | ICD-10-CM

## 2021-01-12 DIAGNOSIS — Z961 Presence of intraocular lens: Secondary | ICD-10-CM | POA: Diagnosis not present

## 2021-01-12 DIAGNOSIS — H35373 Puckering of macula, bilateral: Secondary | ICD-10-CM

## 2021-01-12 DIAGNOSIS — E113393 Type 2 diabetes mellitus with moderate nonproliferative diabetic retinopathy without macular edema, bilateral: Secondary | ICD-10-CM | POA: Diagnosis not present

## 2021-01-12 DIAGNOSIS — H26493 Other secondary cataract, bilateral: Secondary | ICD-10-CM

## 2021-01-12 DIAGNOSIS — I10 Essential (primary) hypertension: Secondary | ICD-10-CM | POA: Diagnosis not present

## 2021-01-12 DIAGNOSIS — H35033 Hypertensive retinopathy, bilateral: Secondary | ICD-10-CM | POA: Diagnosis not present

## 2021-01-24 ENCOUNTER — Other Ambulatory Visit: Payer: Self-pay

## 2021-01-24 ENCOUNTER — Ambulatory Visit: Payer: Medicare Other | Admitting: Internal Medicine

## 2021-01-24 ENCOUNTER — Encounter: Payer: Self-pay | Admitting: Internal Medicine

## 2021-01-24 ENCOUNTER — Other Ambulatory Visit: Payer: Self-pay | Admitting: Internal Medicine

## 2021-01-24 VITALS — BP 199/91 | HR 124 | Temp 97.5°F | Ht 65.0 in | Wt 152.0 lb

## 2021-01-24 DIAGNOSIS — Z860100 Personal history of colon polyps, unspecified: Secondary | ICD-10-CM

## 2021-01-24 DIAGNOSIS — R198 Other specified symptoms and signs involving the digestive system and abdomen: Secondary | ICD-10-CM | POA: Diagnosis not present

## 2021-01-24 DIAGNOSIS — K219 Gastro-esophageal reflux disease without esophagitis: Secondary | ICD-10-CM | POA: Diagnosis not present

## 2021-01-24 DIAGNOSIS — I10 Essential (primary) hypertension: Secondary | ICD-10-CM

## 2021-01-24 DIAGNOSIS — E1169 Type 2 diabetes mellitus with other specified complication: Secondary | ICD-10-CM

## 2021-01-24 MED ORDER — PANTOPRAZOLE SODIUM 40 MG PO TBEC: 40.0000 mg | DELAYED_RELEASE_TABLET | Freq: Every day | ORAL | 11 refills | Status: DC

## 2021-01-24 NOTE — Patient Instructions (Signed)
It was good to see you again today!  As discussed, we will try Protonix or pantoprazole 40 mg daily for acid reflux symptoms.  This may or may not help your abdominal "gurgling".  (Dispense 30 with 11 refills)  Trial of align probiotic 1 daily.  Samples provided.  Office visit here in 3 months.  Patient to call in 3 weeks and let us know how she is doing  Repeat colonoscopy in 2024-history of colon polyps  As discussed, you need much better control of your blood sugars.  At your request, we will refer you to a diabetologist in [place] at your request.  You will also need to work on finding a new primary care physician locally and Dr. Denita Lung absence.

## 2021-01-24 NOTE — Progress Notes (Signed)
Mb  °

## 2021-01-24 NOTE — Progress Notes (Signed)
Primary Care Physician:  Lucia Gaskins, MD Primary Gastroenterologist:  Dr. Gala Romney  Pre-Procedure History & Physical: HPI:  Megan Schroeder is a 75 y.o. female here for further evaluation of abdominal "gurgling". Abdominal noise heard before during and after meals from time to time.  Does have occasional reflux symptoms but no dysphagia.  No nausea or vomiting.  Denies abdominal pain 1 bowel movement daily.  No melena or rectal bleeding denies constipation or diarrhea.  History of multiple colonic polyps removed over time.  She is due for repeat colonoscopy for surveillance 2024.  Her sister is battling stage IV colon cancer at this time.  Incidentally, her diabetes is historically been under very poor control with hemoglobin A1c is running in the 10 range over the past decade.  Dr. Cindie Laroche is out for an unknown period of time.  She is without a primary care physician.  She has not seen an endocrinologist to date.  She wants me to refer her to an endocrinologist.  Blood pressure significantly elevated.  Past Medical History:  Diagnosis Date   Anxiety    Diabetes mellitus    Diabetic retinopathy ([place])    NPDR OU   Hyperlipidemia    Hypertension    Hypertensive retinopathy    OU   Thyroid disease     Past Surgical History:  Procedure Laterality Date   BREAST EXCISIONAL BIOPSY Left    CATARACT EXTRACTION W/PHACO Left 12/16/2017   Procedure: CATARACT EXTRACTION PHACO AND INTRAOCULAR LENS PLACEMENT ([place]);  Surgeon: [name], MD;  Location: [facility];  Service: Ophthalmology;  Laterality: Left;  CDE: 11.65   CATARACT EXTRACTION W/PHACO Right 12/30/2017   Procedure: CATARACT EXTRACTION PHACO AND INTRAOCULAR LENS PLACEMENT RIGHT EYE;  Surgeon: [name], MD;  Location: [facility];  Service: Ophthalmology;  Laterality: Right;  CDE: 9.07   COLONOSCOPY N/A 09/15/2015   Dr. Dudley Major multiple rectal and colonic polyps removed. Hepatic flexure with 9 mm polyp, multiple 5-7 mm polyps. Multiple  cecal polyps with largest 1.5 cm, one 5 mm polyp in rectum. Path for colonic polyps with sessile serrated polyp without dysplasia, no high grade dysplasia, tubular adenomas, rectal hyperplastic polyp   COLONOSCOPY WITH PROPOFOL N/A 07/20/2019   Procedure: COLONOSCOPY WITH PROPOFOL;  Surgeon: [name], MD;  [facility];  Service: Endoscopy;  Laterality: N/A;  8:30am   EYE SURGERY Bilateral 2019   Cat Sx - Dr. [name]   LEFT HEART CATHETERIZATION WITH CORONARY ANGIOGRAM N/A 07/23/2011   Procedure: LEFT HEART CATHETERIZATION WITH CORONARY ANGIOGRAM;  Surgeon: [name], MD;  Location: [facility] CATH LAB;  Service: Cardiovascular;  Laterality: N/A;   POLYPECTOMY  07/20/2019   Procedure: POLYPECTOMY;  Surgeon: [name], MD;  [facility];  Service: Endoscopy;;   YAG LASER APPLICATION Right [id number]   Dr. [name]   YAG LASER APPLICATION Left [id number]   Dr. [name]    Prior to Admission medications   Medication Sig Start Date End Date Taking? Authorizing Provider  acetaminophen (TYLENOL) 325 MG tablet Take 2 tablets (650 mg total) by mouth every 6 (six) hours as needed for mild pain (or Fever >/= 101). 05/17/20  Yes Emokpae, Courage, MD  amLODipine (NORVASC) 10 MG tablet Take 1 tablet (10 mg total) by mouth daily. For BP 05/17/20  Yes Emokpae, Courage, MD  Ascorbic Acid (VITAMIN C) 1000 MG tablet Take 1,000 mg by mouth daily.   Yes [provider]  aspirin EC 81  MG tablet Take 1 tablet (81 mg total) by mouth daily with breakfast. 05/17/20  Yes Emokpae, Courage, MD  atorvastatin (LIPITOR) 20 MG tablet Take 1 tablet (20 mg total) by mouth every evening. 05/17/20  Yes Emokpae, Courage, MD  cholecalciferol (VITAMIN D) 1000 UNITS tablet Take 1,000 Units by mouth daily.    Yes [provider]  Coenzyme Q10 (COQ-10) 200 MG CAPS Take 200 mg by mouth daily.    Yes [provider]  dapagliflozin propanediol (FARXIGA) 10 MG TABS tablet Take  1 tablet (10 mg total) by mouth daily. 05/17/20  Yes Roxan Hockey, MD  GNP ULTICARE PEN NEEDLES 31G X 5 MM MISC See admin instructions. 09/07/20  Yes [provider]  levothyroxine (SYNTHROID) 75 MCG tablet Take 1 tablet (75 mcg total) by mouth daily before breakfast. 05/17/20  Yes Emokpae, Courage, MD  metFORMIN (GLUCOPHAGE) 1000 MG tablet Take 1 tablet (1,000 mg total) by mouth 2 (two) times daily with a meal. 05/17/20  Yes Emokpae, Courage, MD  Omega-3 Fatty Acids (FISH OIL) 1200 MG CAPS Take 1,200 mg by mouth every other day.    Yes [provider]  TOUJEO SOLOSTAR 300 UNIT/ML SOPN Inject 50 Units into the skin at bedtime.  06/17/19  Yes [provider]  TRULICITY A999333 0000000 SOPN Inject 0.75 mg into the skin once a week. 05/10/20  Yes [provider]    Allergies as of 01/24/2021 - Review Complete 01/24/2021  Allergen Reaction Noted   Penicillins Hives 07/21/2011   Hydrocodone Nausea And Vomiting 12/30/2017    Family History  Problem Relation Age of Onset   Cancer Father    Diabetes Father    Colon cancer Neg Hx     Social History   Socioeconomic History   Marital status: Widowed    Spouse name: Not on file   Number of children: Not on file   Years of education: Not on file   Highest education level: Not on file  Occupational History   Not on file  Tobacco Use   Smoking status: Never   Smokeless tobacco: Never  Substance and Sexual Activity   Alcohol use: No   Drug use: No   Sexual activity: Yes    Birth control/protection: Post-menopausal  Other Topics Concern   Not on file  Social History Narrative   Not on file   Social Determinants of Health   Financial Resource Strain: Not on file  Food Insecurity: Not on file  Transportation Needs: Not on file  Physical Activity: Not on file  Stress: Not on file  Social Connections: Not on file  Intimate Partner Violence: Not on file    Review of Systems: See HPI, otherwise  negative ROS  Physical Exam: BP (!) 199/91   Pulse (!) 124   Temp (!) 97.5 F (36.4 C)   Ht '5\' 5"'$  (1.651 m)   Wt 152 lb (68.9 kg)   BMI 25.29 kg/m  General:   Alert,   pleasant and cooperative in NAD Neck:  Supple; no masses or thyromegaly. No significant cervical adenopathy. Lungs:  Clear throughout to auscultation.   No wheezes, crackles, or rhonchi. No acute distress. Heart:  Regular rate and rhythm; no murmurs, clicks, rubs,  or gallops. Abdomen: Non-distended, normal bowel sounds.  Soft and nontender without appreciable mass or hepatosplenomegaly.  No succussion splash. Pulses:  Normal pulses noted. Extremities:  Without clubbing or edema.  Impression/Plan: 75 year old lady with historically very poorly controlled diabetes mellitus presents with a  complaint of abdominal gurgling".  Sounds like borborygmi more than anything else.  She does not have any obstructing symptoms.  No abdominal pain.  She does have untreated reflux without any alarm features otherwise.  History of colonic polyps; due for surveillance colonoscopy in 2024.  Recommendations:  As discussed, we will try Protonix or pantoprazole 40 mg daily for acid reflux symptoms.  This may or may not help your abdominal "gurgling".  (Dispense 30 with 11 refills)  Trial of align probiotic 1 daily.  Samples provided.  Office visit here in 3 months.  Patient to call in 3 weeks and let us know how she is doing  Repeat colonoscopy in 2024-history of colon polyps  As discussed, you need much better control of your blood sugars.  At your request, we will refer you to a diabetologist in [place] at your request.  You will also need to work on finding a new primary care physician locally in Dr. Denita Lung absence.  Follow-up with PCP regarding elevated blood pressure noted today.     Notice: This dictation was prepared with Dragon dictation along with smaller phrase technology. Any transcriptional errors that result from  this process are unintentional and may not be corrected upon review.

## 2021-02-15 ENCOUNTER — Ambulatory Visit: Payer: Medicare Other | Admitting: Family Medicine

## 2021-03-03 DIAGNOSIS — E119 Type 2 diabetes mellitus without complications: Secondary | ICD-10-CM | POA: Diagnosis not present

## 2021-03-03 DIAGNOSIS — K219 Gastro-esophageal reflux disease without esophagitis: Secondary | ICD-10-CM | POA: Diagnosis not present

## 2021-03-03 DIAGNOSIS — E039 Hypothyroidism, unspecified: Secondary | ICD-10-CM | POA: Diagnosis not present

## 2021-03-03 DIAGNOSIS — R000 Tachycardia, unspecified: Secondary | ICD-10-CM | POA: Diagnosis not present

## 2021-03-03 DIAGNOSIS — Z23 Encounter for immunization: Secondary | ICD-10-CM | POA: Diagnosis not present

## 2021-03-03 DIAGNOSIS — Z0189 Encounter for other specified special examinations: Secondary | ICD-10-CM | POA: Diagnosis not present

## 2021-03-03 DIAGNOSIS — I10 Essential (primary) hypertension: Secondary | ICD-10-CM | POA: Diagnosis not present

## 2021-03-03 DIAGNOSIS — E785 Hyperlipidemia, unspecified: Secondary | ICD-10-CM | POA: Diagnosis not present

## 2021-03-07 DIAGNOSIS — M79676 Pain in unspecified toe(s): Secondary | ICD-10-CM | POA: Diagnosis not present

## 2021-03-07 DIAGNOSIS — L84 Corns and callosities: Secondary | ICD-10-CM | POA: Diagnosis not present

## 2021-03-07 DIAGNOSIS — B351 Tinea unguium: Secondary | ICD-10-CM | POA: Diagnosis not present

## 2021-03-07 DIAGNOSIS — E1151 Type 2 diabetes mellitus with diabetic peripheral angiopathy without gangrene: Secondary | ICD-10-CM | POA: Diagnosis not present

## 2021-03-08 NOTE — Addendum Note (Signed)
Addended by: Cheron Every on: 03/08/2021 11:16 AM   Modules accepted: Orders

## 2021-03-16 DIAGNOSIS — E039 Hypothyroidism, unspecified: Secondary | ICD-10-CM | POA: Diagnosis not present

## 2021-03-16 DIAGNOSIS — E119 Type 2 diabetes mellitus without complications: Secondary | ICD-10-CM | POA: Diagnosis not present

## 2021-03-21 ENCOUNTER — Ambulatory Visit: Payer: Medicare Other | Admitting: Cardiology

## 2021-03-23 DIAGNOSIS — E785 Hyperlipidemia, unspecified: Secondary | ICD-10-CM | POA: Diagnosis not present

## 2021-03-23 DIAGNOSIS — R000 Tachycardia, unspecified: Secondary | ICD-10-CM | POA: Diagnosis not present

## 2021-03-23 DIAGNOSIS — E119 Type 2 diabetes mellitus without complications: Secondary | ICD-10-CM | POA: Diagnosis not present

## 2021-03-23 DIAGNOSIS — I10 Essential (primary) hypertension: Secondary | ICD-10-CM | POA: Diagnosis not present

## 2021-03-23 DIAGNOSIS — E039 Hypothyroidism, unspecified: Secondary | ICD-10-CM | POA: Diagnosis not present

## 2021-03-23 DIAGNOSIS — Z13820 Encounter for screening for osteoporosis: Secondary | ICD-10-CM | POA: Diagnosis not present

## 2021-03-28 IMAGING — CR DG CHEST 2V
2 series · 2 of 2 positions shown · non-contrast
Comparison: 07/29/2012

CLINICAL DATA: Chest pain

EXAM:
CHEST - 2 VIEW

[w pa chest]
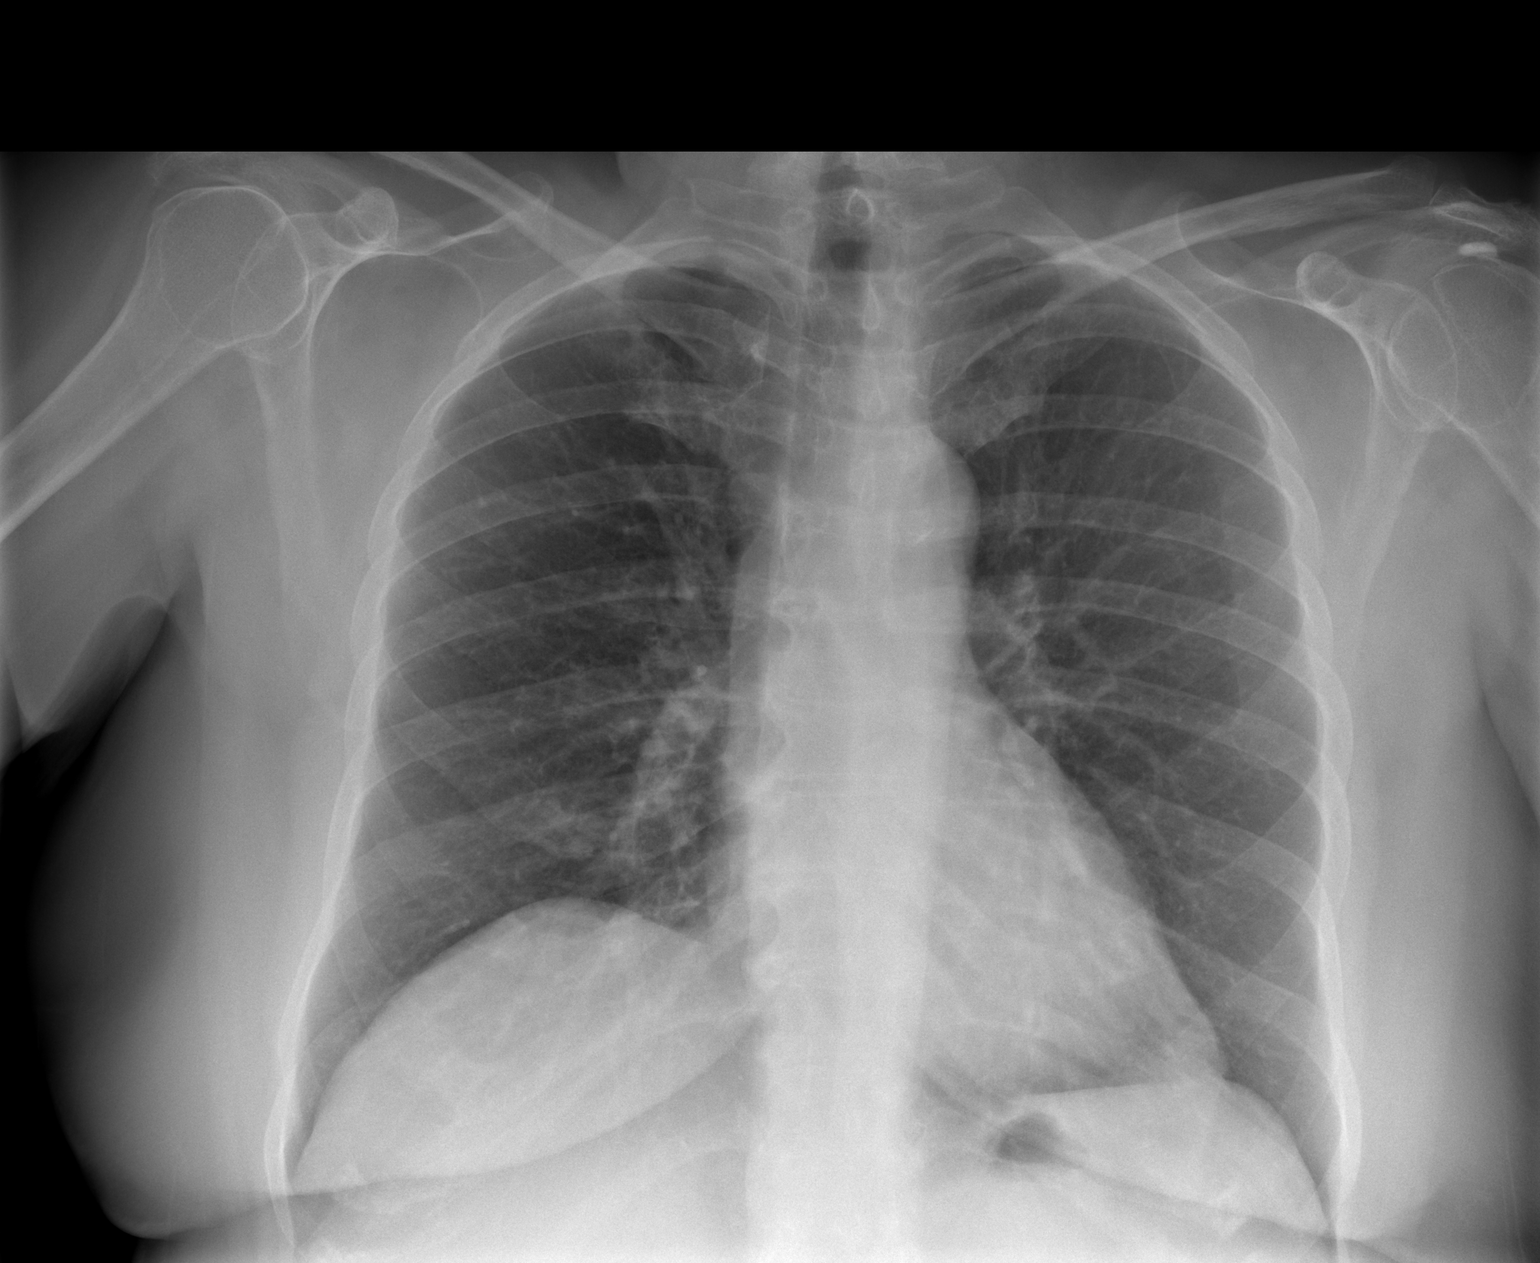

[w chest lat]
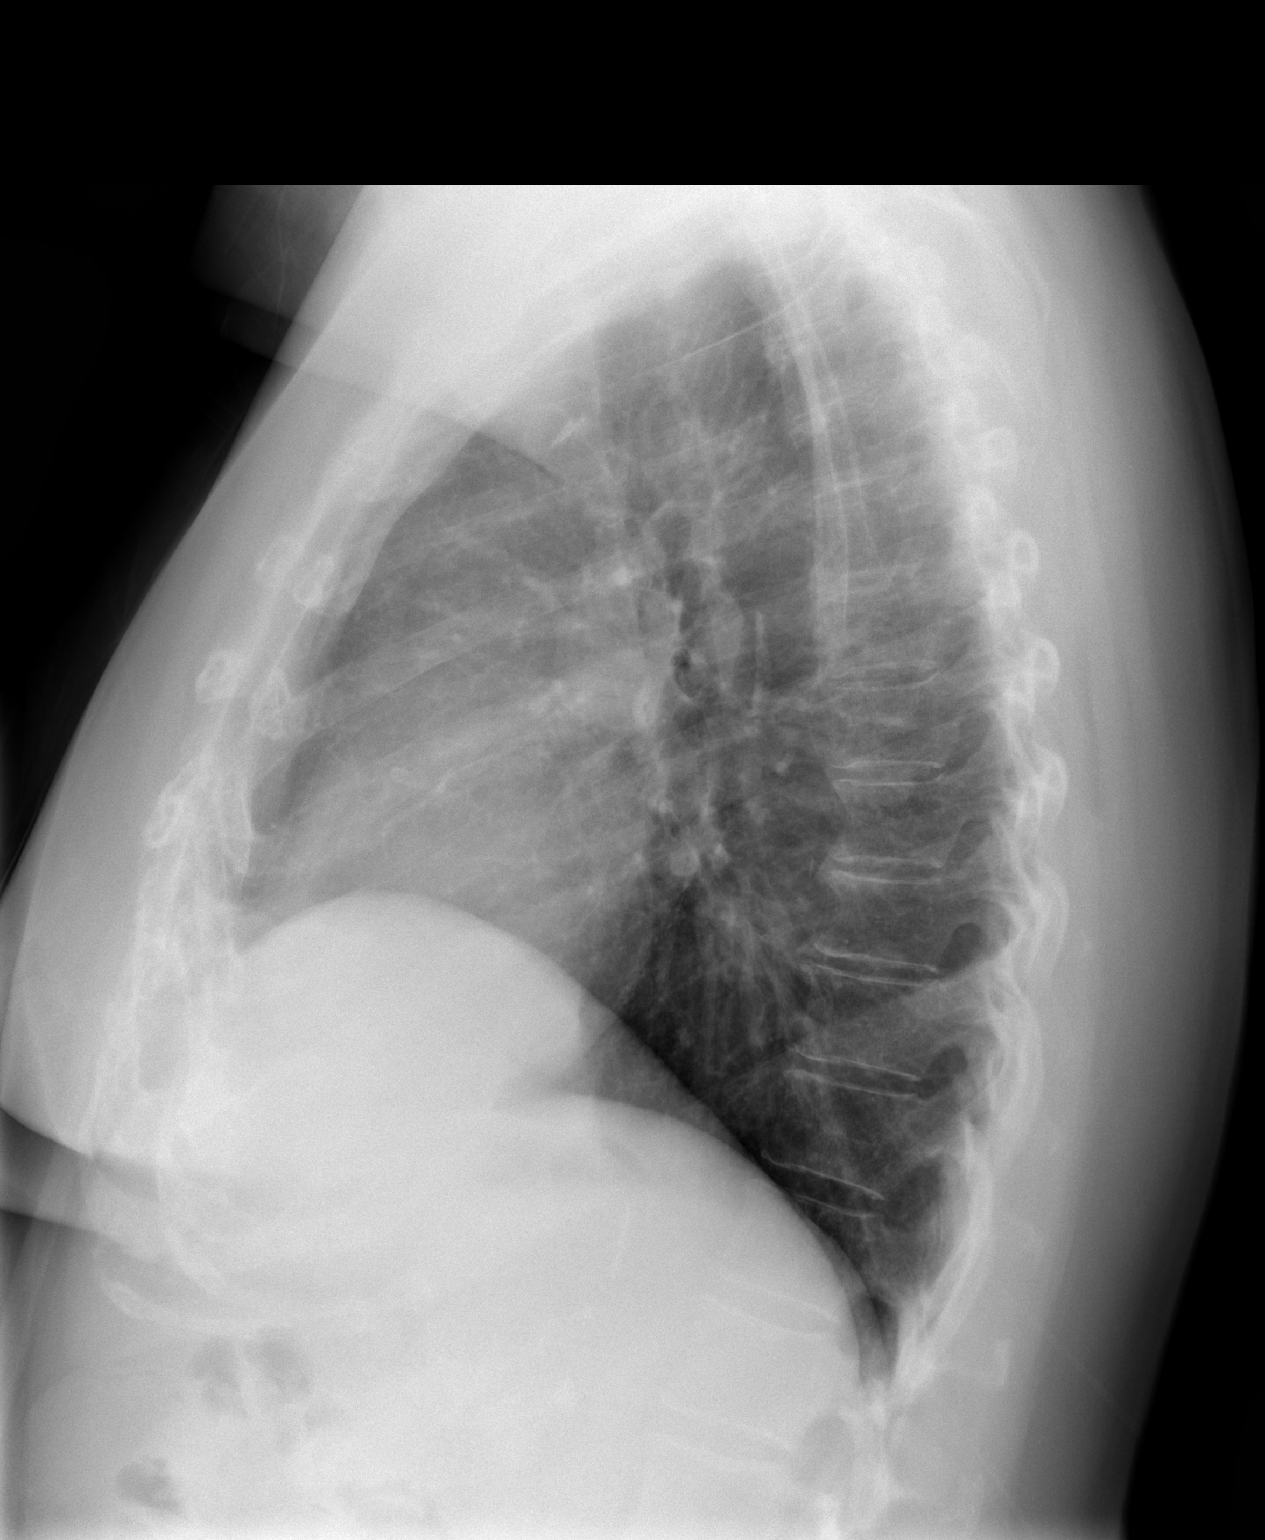

[2 of 2 positions shown; findings below may reference images not displayed]

FINDINGS: The heart size and mediastinal contours are within normal limits. No
focal airspace consolidation, pleural effusion, or pneumothorax. No
acute osseous findings. Evidence of calcific tendinopathy of the
left rotator cuff.
IMPRESSION: No active cardiopulmonary disease.

## 2021-04-06 DIAGNOSIS — I10 Essential (primary) hypertension: Secondary | ICD-10-CM | POA: Diagnosis not present

## 2021-04-06 DIAGNOSIS — E785 Hyperlipidemia, unspecified: Secondary | ICD-10-CM | POA: Diagnosis not present

## 2021-04-18 ENCOUNTER — Encounter: Payer: Self-pay | Admitting: Internal Medicine

## 2021-05-02 DIAGNOSIS — L293 Anogenital pruritus, unspecified: Secondary | ICD-10-CM | POA: Diagnosis not present

## 2021-05-02 DIAGNOSIS — I10 Essential (primary) hypertension: Secondary | ICD-10-CM | POA: Diagnosis not present

## 2021-05-12 NOTE — Progress Notes (Signed)
[facility] Note  05/18/2021     CHIEF COMPLAINT Patient presents for Retina Follow Up   HISTORY OF PRESENT ILLNESS: Megan Schroeder is a 75 y.o. female who presents to the clinic today for:   HPI     Retina Follow Up   Patient presents with  Other.  In both eyes.  Severity is moderate.  Duration of 4 months.  Since onset it is stable.  I, the attending physician,  performed the HPI with the patient and updated documentation appropriately.        Comments   Pt here for 4 mo ret f/u for ERM OU. Pt states no change in vision, no ocular pain or discomfort. Most recent A1C level was 7.1, due in Feb. Blood sugar this morning was 150.       Last edited by [name], MD on 05/18/2021 10:11 PM.     pt states vision is doing well, she got new glasses, but only has to wear them when she reads, she feels like her right eye is not as good as the left  Referring physician: Lucia Gaskins, MD [place],  [place] [address]  HISTORICAL INFORMATION:   Selected notes from the MEDICAL RECORD NUMBER Referred by Dr. [name]   CURRENT MEDICATIONS: Current Outpatient Medications (Ophthalmic Drugs)  Medication Sig   Bromfenac Sodium (PROLENSA) 0.07 % SOLN Place 1 drop into the right eye 4 (four) times daily.   prednisoLONE acetate (PRED FORTE) 1 % ophthalmic suspension Place 1 drop into the right eye 4 (four) times daily.   No current facility-administered medications for this visit. (Ophthalmic Drugs)   Current Outpatient Medications (Other)  Medication Sig   acetaminophen (TYLENOL) 325 MG tablet Take 2 tablets (650 mg total) by mouth every 6 (six) hours as needed for mild pain (or Fever >/= 101).   amLODipine (NORVASC) 10 MG tablet Take 1 tablet (10 mg total) by mouth daily. For BP   Ascorbic Acid (VITAMIN C) 1000 MG tablet Take 1,000 mg by mouth daily.   aspirin EC 81 MG tablet Take 1 tablet (81 mg total) by mouth daily with breakfast.    atorvastatin (LIPITOR) 20 MG tablet Take 1 tablet (20 mg total) by mouth every evening.   cholecalciferol (VITAMIN D) 1000 UNITS tablet Take 1,000 Units by mouth daily.    Coenzyme Q10 (COQ-10) 200 MG CAPS Take 200 mg by mouth daily.    dapagliflozin propanediol (FARXIGA) 10 MG TABS tablet Take 1 tablet (10 mg total) by mouth daily.   GNP ULTICARE PEN NEEDLES 31G X 5 MM MISC See admin instructions.   levothyroxine (SYNTHROID) 75 MCG tablet Take 1 tablet (75 mcg total) by mouth daily before breakfast.   metFORMIN (GLUCOPHAGE) 1000 MG tablet Take 1 tablet (1,000 mg total) by mouth 2 (two) times daily with a meal.   Omega-3 Fatty Acids (FISH OIL) 1200 MG CAPS Take 1,200 mg by mouth every other day.    pantoprazole (PROTONIX) 40 MG tablet Take 1 tablet (40 mg total) by mouth daily.   TOUJEO SOLOSTAR 300 UNIT/ML SOPN Inject 50 Units into the skin at bedtime.    TRULICITY 5.05 WP/7.9YI SOPN Inject 0.75 mg into the skin once a week.   No current facility-administered medications for this visit. (Other)   REVIEW OF SYSTEMS: ROS   Positive for: Gastrointestinal, Endocrine, Cardiovascular, Eyes Negative for: Constitutional, Neurological, Skin, Genitourinary, Musculoskeletal, HENT, Respiratory, Psychiatric, Allergic/Imm, Heme/Lymph Last edited by  Orvan Falconer E, COT on 05/18/2021  8:10 AM.     ALLERGIES Allergies  Allergen Reactions   Penicillins Hives    Has patient had a PCN reaction causing immediate rash, facial/tongue/throat swelling, SOB or lightheadedness with hypotension: No Has patient had a PCN reaction causing severe rash involving mucus membranes or skin necrosis: Yes Has patient had a PCN reaction that required hospitalization: No Has patient had a PCN reaction occurring within the last 10 years: No If all of the above answers are "NO", then may proceed with Cephalosporin use.     Hydrocodone Nausea And Vomiting    PAST MEDICAL HISTORY Past Medical History:  Diagnosis Date    Anxiety    Diabetes mellitus    Diabetic retinopathy ([place])    NPDR OU   Hyperlipidemia    Hypertension    Hypertensive retinopathy    OU   Thyroid disease    Past Surgical History:  Procedure Laterality Date   BREAST EXCISIONAL BIOPSY Left    CATARACT EXTRACTION W/PHACO Left 12/16/2017   Procedure: CATARACT EXTRACTION PHACO AND INTRAOCULAR LENS PLACEMENT ([place]);  Surgeon: [name], MD;  Location: [facility];  Service: Ophthalmology;  Laterality: Left;  CDE: 11.65   CATARACT EXTRACTION W/PHACO Right 12/30/2017   Procedure: CATARACT EXTRACTION PHACO AND INTRAOCULAR LENS PLACEMENT RIGHT EYE;  Surgeon: [name], MD;  Location: [facility];  Service: Ophthalmology;  Laterality: Right;  CDE: 9.07   COLONOSCOPY N/A 09/15/2015   Dr. Dudley Major multiple rectal and colonic polyps removed. Hepatic flexure with 9 mm polyp, multiple 5-7 mm polyps. Multiple cecal polyps with largest 1.5 cm, one 5 mm polyp in rectum. Path for colonic polyps with sessile serrated polyp without dysplasia, no high grade dysplasia, tubular adenomas, rectal hyperplastic polyp   COLONOSCOPY WITH PROPOFOL N/A 07/20/2019   Procedure: COLONOSCOPY WITH PROPOFOL;  Surgeon: [name], MD;  Location: [facility];  Service: Endoscopy;  Laterality: N/A;  8:30am   EYE SURGERY Bilateral 2019   Cat Sx - Dr. [name]   LEFT HEART CATHETERIZATION WITH CORONARY ANGIOGRAM N/A 07/23/2011   Procedure: LEFT HEART CATHETERIZATION WITH CORONARY ANGIOGRAM;  Surgeon: [name], MD;  Location[facility] CATH LAB;  Service: Cardiovascular;  Laterality: N/A;   POLYPECTOMY  07/20/2019   Procedure: POLYPECTOMY;  Surgeon: [name], MD;  [facility];  Service: Endoscopy;;   YAG LASER APPLICATION Right 46/27/0350   Dr. [name]   YAG LASER APPLICATION Left 09/38/1829   Dr. [name]    FAMILY HISTORY Family History  Problem Relation Age of Onset   Cancer Father    Diabetes Father    Colon cancer Neg Hx     SOCIAL  HISTORY Social History   Tobacco Use   Smoking status: Never   Smokeless tobacco: Never  Substance Use Topics   Alcohol use: No   Drug use: No       OPHTHALMIC EXAM:  Base Eye Exam     Visual Acuity (Snellen - Linear)       Right Left   Dist cc 20/40 20/20 -1   Dist ph cc 20/30 -2     Correction: Glasses         Tonometry (Tonopen, 8:20 AM)       Right Left   Pressure 20 19         Pupils       Dark Light Shape React APD   Right 3 2 Round Brisk None  Left 3 2 Round Brisk None         Visual Fields (Counting fingers)       Left Right    Full Full         Extraocular Movement       Right Left    Full, Ortho Full, Ortho         Neuro/Psych     Oriented x3: Yes   Mood/Affect: Normal         Dilation     Both eyes: 1.0% Mydriacyl, 2.5% Phenylephrine @ 8:22 AM           Slit Lamp and Fundus Exam     Slit Lamp Exam       Right Left   Lids/Lashes Dermatochalasis - upper lid, mild MGD Dermatochalasis - upper lid, mild MGD   Conjunctiva/Sclera white and quiet white and quiet   Cornea trace PEE, well healed temporal cataract wound 1-2+PEE, well healed temporal cataract wound, decreased TBUT, mild tear film debris   Anterior Chamber deep and clear deep and clear   Iris round and dilated round and dilated   Lens PC IOL in good position, open PC PC IOL in good position with open PC   Anterior Vitreous syneresis, PVD syneresis, Posterior vitreous detachment, vitreous condensations         Fundus Exam       Right Left   Disc pink and sharp, compact, mild PPA, small heme at 0300 - resolved pink and sharp, compact, mild PPP   C/D Ratio 0.1 0.1   Macula flat, blunted foveal reflex, ERM with striae inferiorly, punctate IRH, +central cyst - slightly increased flat, blunted foveal reflex, mild ERM, rare MA, no edema   Vessels attenuated, tortuous greatest IT arcades attenuated, tortuous   Periphery attached, scattered MA / DBH greatest  superior to disc attached, rare MA/DBH, no RT/RD 360           Refraction     Wearing Rx       Sphere Cylinder Axis Add   Right [place]   +2.50   Left -0.25 +0.50 177 +2.50         Manifest Refraction       Sphere Cylinder Axis Dist VA   Right +0.50 +0.25 180 25-2   Left                IMAGING AND PROCEDURES  Imaging and Procedures for 05/18/2021  OCT, Retina - OU - Both Eyes       Right Eye Quality was good. Central Foveal Thickness: 394. Progression has worsened. Findings include abnormal foveal contour, no SRF, intraretinal fluid, epiretinal membrane, macular pucker (ERM with inferior pucker, mild interval increase in IRF).   Left Eye Quality was good. Central Foveal Thickness: 261. Progression has been stable. Findings include normal foveal contour, no SRF, epiretinal membrane, no IRF (Interval release of Partial PVD).   Notes *Images captured and stored on drive  Diagnosis / Impression:  OD: ERM with inferior pucker, mild interval increase in IRF OS: mild ERM with early pucker, partial PVD   Clinical management:  See below  Abbreviations: NFP - Normal foveal profile. CME - cystoid macular edema. PED - pigment epithelial detachment. IRF - intraretinal fluid. SRF - subretinal fluid. EZ - ellipsoid zone. ERM - epiretinal membrane. ORA - outer retinal atrophy. ORT - outer retinal tubulation. SRHM - subretinal hyper-reflective material. IRHM - intraretinal hyper-reflective material  ASSESSMENT/PLAN:    ICD-10-CM   1. Epiretinal membrane (ERM) of both eyes  H35.373     2. Retinal edema  H35.81 OCT, Retina - OU - Both Eyes    3. Moderate nonproliferative diabetic retinopathy of both eyes without macular edema associated with type 2 diabetes mellitus ([place])  W97.9892     4. Essential hypertension  I10     5. Hypertensive retinopathy of both eyes  H35.033     6. Pseudophakia of both eyes  Z96.1      1,2. Epiretinal membrane OU --  stable  - BCVA OD 20/25; OS 20/20 -- stable  - OCT shows ERM with inferior pucker, mild interval increase in IRF  - IRF: ?DME vs CME vs cystic changes  - discussed possible treatment options for macular edema  - will start on PF and Prolensa QID OD only for possible CME component  - no metamorphopsia  - monitor for now   - f/u 5 weeks -- DFE/OCT  3. Mild nonproliferative diabetic retinopathy OU - BCVA OD: 20/30, OS: 20/20-2 - exam shows scattered MA, no NV - OCT without diabetic macular edema OS -- ?+DME OD - discussed possible intravitreal injection to treat possible DME component -- will hold off for now - f/u in 5 wks -- DFE/OCT  4,5. Hypertensive retinopathy OU - discussed importance of tight BP control - monitor   6. Pseudophakia OU  - s/p CE/IOL OU, 2019 (Dr. Geoffry Paradise)  - IOLs in good position, doing well  - s/p YAG cap OD 3.24.22  - s/p YAG cap OS 04.07.22  - monitor   Ophthalmic Meds Ordered this visit:  Meds ordered this encounter  Medications   prednisoLONE acetate (PRED FORTE) 1 % ophthalmic suspension    Sig: Place 1 drop into the right eye 4 (four) times daily.    Dispense:  10 mL    Refill:  0   Bromfenac Sodium (PROLENSA) 0.07 % SOLN    Sig: Place 1 drop into the right eye 4 (four) times daily.    Dispense:  3 mL    Refill:  6     Return in about 5 weeks (around 06/22/2021) for f/u ERM OU, DFE, OCT.  There are no Patient Instructions on file for this visit.  This document serves as a record of services personally performed by Gardiner Sleeper, MD, PhD. It was created on their behalf by San Jetty. Owens Shark, OA an ophthalmic technician. The creation of this record is the provider's dictation and/or activities during the visit.    Electronically signed by: San Jetty. Owens Shark, [place] 12.02.2022 10:15 PM   Gardiner Sleeper, M.D., Ph.D. Diseases & Surgery of the Retina and Vitreous [facility]  I have reviewed the above documentation for accuracy  and completeness, and I agree with the above. Gardiner Sleeper, M.D., Ph.D. 05/18/21 10:15 PM   Abbreviations: M myopia (nearsighted); A astigmatism; H hyperopia (farsighted); P presbyopia; Mrx spectacle prescription;  CTL contact lenses; OD right eye; OS left eye; OU both eyes  XT exotropia; ET esotropia; PEK punctate epithelial keratitis; PEE punctate epithelial erosions; DES dry eye syndrome; MGD meibomian gland dysfunction; ATs artificial tears; PFAT's preservative free artificial tears; [place] nuclear sclerotic cataract; PSC posterior subcapsular cataract; ERM epi-retinal membrane; PVD posterior vitreous detachment; RD retinal detachment; DM diabetes mellitus; DR diabetic retinopathy; NPDR non-proliferative diabetic retinopathy; PDR proliferative diabetic retinopathy; CSME clinically significant macular edema; DME diabetic macular edema; dbh dot blot hemorrhages;  CWS cotton wool spot; POAG primary open angle glaucoma; C/D cup-to-disc ratio; HVF humphrey visual field; GVF goldmann visual field; OCT optical coherence tomography; IOP intraocular pressure; BRVO Branch retinal vein occlusion; CRVO central retinal vein occlusion; CRAO central retinal artery occlusion; BRAO branch retinal artery occlusion; RT retinal tear; SB scleral buckle; PPV pars plana vitrectomy; VH Vitreous hemorrhage; PRP panretinal laser photocoagulation; IVK intravitreal kenalog; VMT vitreomacular traction; MH Macular hole;  NVD neovascularization of the disc; NVE neovascularization elsewhere; AREDS age related eye disease study; ARMD age related macular degeneration; POAG primary open angle glaucoma; EBMD epithelial/anterior basement membrane dystrophy; ACIOL anterior chamber intraocular lens; IOL intraocular lens; PCIOL posterior chamber intraocular lens; Phaco/IOL phacoemulsification with intraocular lens placement; [place] photorefractive keratectomy; LASIK laser assisted in situ keratomileusis; HTN hypertension; DM diabetes mellitus; COPD  chronic obstructive pulmonary disease

## 2021-05-16 ENCOUNTER — Encounter (INDEPENDENT_AMBULATORY_CARE_PROVIDER_SITE_OTHER): Payer: Medicare Other | Admitting: Ophthalmology

## 2021-05-16 DIAGNOSIS — B351 Tinea unguium: Secondary | ICD-10-CM | POA: Diagnosis not present

## 2021-05-16 DIAGNOSIS — E1142 Type 2 diabetes mellitus with diabetic polyneuropathy: Secondary | ICD-10-CM | POA: Diagnosis not present

## 2021-05-16 DIAGNOSIS — L84 Corns and callosities: Secondary | ICD-10-CM | POA: Diagnosis not present

## 2021-05-16 DIAGNOSIS — M79676 Pain in unspecified toe(s): Secondary | ICD-10-CM | POA: Diagnosis not present

## 2021-05-18 ENCOUNTER — Ambulatory Visit (INDEPENDENT_AMBULATORY_CARE_PROVIDER_SITE_OTHER): Payer: Medicare Other | Admitting: Ophthalmology

## 2021-05-18 ENCOUNTER — Other Ambulatory Visit: Payer: Self-pay

## 2021-05-18 ENCOUNTER — Encounter (INDEPENDENT_AMBULATORY_CARE_PROVIDER_SITE_OTHER): Payer: Self-pay | Admitting: Ophthalmology

## 2021-05-18 DIAGNOSIS — H3581 Retinal edema: Secondary | ICD-10-CM

## 2021-05-18 DIAGNOSIS — H26493 Other secondary cataract, bilateral: Secondary | ICD-10-CM

## 2021-05-18 DIAGNOSIS — I10 Essential (primary) hypertension: Secondary | ICD-10-CM | POA: Diagnosis not present

## 2021-05-18 DIAGNOSIS — H35373 Puckering of macula, bilateral: Secondary | ICD-10-CM

## 2021-05-18 DIAGNOSIS — E113393 Type 2 diabetes mellitus with moderate nonproliferative diabetic retinopathy without macular edema, bilateral: Secondary | ICD-10-CM

## 2021-05-18 DIAGNOSIS — Z961 Presence of intraocular lens: Secondary | ICD-10-CM | POA: Diagnosis not present

## 2021-05-18 DIAGNOSIS — H35033 Hypertensive retinopathy, bilateral: Secondary | ICD-10-CM

## 2021-05-18 MED ORDER — PROLENSA 0.07 % OP SOLN: 1.0000 [drp] | Freq: Four times a day (QID) | OPHTHALMIC | 6 refills | Status: DC

## 2021-05-18 MED ORDER — PREDNISOLONE ACETATE 1 % OP SUSP: 1.0000 [drp] | Freq: Four times a day (QID) | OPHTHALMIC | 0 refills | Status: DC

## 2021-05-30 ENCOUNTER — Encounter: Payer: Self-pay | Admitting: Cardiology

## 2021-05-30 ENCOUNTER — Ambulatory Visit: Payer: Medicare Other | Admitting: Cardiology

## 2021-05-30 ENCOUNTER — Other Ambulatory Visit: Payer: Self-pay

## 2021-05-30 VITALS — BP 142/72 | HR 102 | Ht 62.0 in | Wt 153.8 lb

## 2021-05-30 DIAGNOSIS — I10 Essential (primary) hypertension: Secondary | ICD-10-CM | POA: Diagnosis not present

## 2021-05-30 DIAGNOSIS — Z87898 Personal history of other specified conditions: Secondary | ICD-10-CM | POA: Diagnosis not present

## 2021-05-30 DIAGNOSIS — E782 Mixed hyperlipidemia: Secondary | ICD-10-CM

## 2021-05-30 NOTE — Addendum Note (Signed)
Addended by: Laurine Blazer on: 05/30/2021 02:57 PM   Modules accepted: Orders

## 2021-05-30 NOTE — Patient Instructions (Addendum)

## 2021-05-30 NOTE — Progress Notes (Signed)
Cardiology Office Note  Date: 05/30/2021   ID: Megan Schroeder, DOB [birth year], MRN [id number]  PCP:  [name], FNP  Cardiologist:  Rozann Lesches, MD Electrophysiologist:  None   Chief Complaint  Patient presents with   Cardiac follow-up    History of Present Illness: Megan Schroeder is a 75 y.o. female last seen in December 2021 by Mr. Leonides Sake NP.  She is here today with her husband for a follow-up visit.  She tells me that she has been doing very well, no exertional chest pain or shortness of breath beyond NYHA class I.  She and a friend exercise 6 evenings a week for an hour at a time, one walks on the treadmill while the other uses a stationary bicycle.  She has essential hypertension with whitecoat hypertension as well.  Reports blood pressures at home in the 130s over 70s to 80s.  I rechecked her blood pressure today at 142/72, her heart rate settled down to the 70s eventually.  Cardiac structural and ischemic testing from December 2021 outlined below.  I personally reviewed her follow-up ECG today which shows sinus rhythm with PVC.  Past Medical History:  Diagnosis Date   Anxiety    Diabetic retinopathy ([place])    NPDR OU   Hyperlipidemia    Hypertension    Hypertensive retinopathy    OU   Hypothyroidism    Type 2 diabetes mellitus ([place])     Past Surgical History:  Procedure Laterality Date   BREAST EXCISIONAL BIOPSY Left    CATARACT EXTRACTION W/PHACO Left 12/16/2017   Procedure: CATARACT EXTRACTION PHACO AND INTRAOCULAR LENS PLACEMENT ([place]);  Surgeon: [name], MD;  Location: [facility];  Service: Ophthalmology;  Laterality: Left;  CDE: 11.65   CATARACT EXTRACTION W/PHACO Right 12/30/2017   Procedure: CATARACT EXTRACTION PHACO AND INTRAOCULAR LENS PLACEMENT RIGHT EYE;  Surgeon: [name], MD;  Location: [facility];  Service: Ophthalmology;  Laterality: Right;  CDE: 9.07   COLONOSCOPY N/A 09/15/2015   Dr. Dudley Major multiple rectal and colonic polyps removed. Hepatic  flexure with 9 mm polyp, multiple 5-7 mm polyps. Multiple cecal polyps with largest 1.5 cm, one 5 mm polyp in rectum. Path for colonic polyps with sessile serrated polyp without dysplasia, no high grade dysplasia, tubular adenomas, rectal hyperplastic polyp   COLONOSCOPY WITH PROPOFOL N/A 07/20/2019   Procedure: COLONOSCOPY WITH PROPOFOL;  Surgeon: [name], MD;  [facility];  Service: Endoscopy;  Laterality: N/A;  8:30am   EYE SURGERY Bilateral 2019   Cat Sx - Dr. [name]   LEFT HEART CATHETERIZATION WITH CORONARY ANGIOGRAM N/A 07/23/2011   Procedure: LEFT HEART CATHETERIZATION WITH CORONARY ANGIOGRAM;  Surgeon: [name], MD;  Location[facility] CATH LAB;  Service: Cardiovascular;  Laterality: N/A;   POLYPECTOMY  07/20/2019   Procedure: POLYPECTOMY;  Surgeon: [name], MD;  [facility];  Service: Endoscopy;;   YAG LASER APPLICATION Right 41/96/2229   Dr. [name]   YAG LASER APPLICATION Left 79/89/2119   Dr. [name]    Current Outpatient Medications  Medication Sig Dispense Refill   acetaminophen (TYLENOL) 325 MG tablet Take 2 tablets (650 mg total) by mouth every 6 (six) hours as needed for mild pain (or Fever >/= 101). 12 tablet 0   amLODipine (NORVASC) 10 MG tablet Take 1 tablet (10 mg total) by mouth daily. For BP 90 tablet 3   Ascorbic Acid (VITAMIN C) 1000 MG tablet Take 1,000 mg  by mouth daily.     aspirin EC 81 MG tablet Take 1 tablet (81 mg total) by mouth daily with breakfast. 30 tablet 11   atorvastatin (LIPITOR) 20 MG tablet Take 1 tablet (20 mg total) by mouth every evening. 90 tablet 3   Bromfenac Sodium (PROLENSA) 0.07 % SOLN Place 1 drop into the right eye 4 (four) times daily. 3 mL 6   cholecalciferol (VITAMIN D) 1000 UNITS tablet Take 1,000 Units by mouth daily.      Coenzyme Q10 (COQ-10) 200 MG CAPS Take 200 mg by mouth daily.      dapagliflozin propanediol (FARXIGA) 10 MG TABS tablet Take 1 tablet (10 mg total) by mouth daily.  90 tablet 3   GNP ULTICARE PEN NEEDLES 31G X 5 MM MISC See admin instructions.     levothyroxine (SYNTHROID) 75 MCG tablet Take 1 tablet (75 mcg total) by mouth daily before breakfast. 90 tablet 3   lisinopril (ZESTRIL) 40 MG tablet Take 40 mg by mouth daily.     metFORMIN (GLUCOPHAGE) 1000 MG tablet Take 1 tablet (1,000 mg total) by mouth 2 (two) times daily with a meal. 180 tablet 3   Omega-3 Fatty Acids (FISH OIL) 1200 MG CAPS Take 1,200 mg by mouth every other day.      pantoprazole (PROTONIX) 40 MG tablet Take 1 tablet (40 mg total) by mouth daily. 30 tablet 11   prednisoLONE acetate (PRED FORTE) 1 % ophthalmic suspension Place 1 drop into the right eye 4 (four) times daily. 10 mL 0   TOUJEO SOLOSTAR 300 UNIT/ML SOPN Inject 50 Units into the skin at bedtime.      TRULICITY 6.54 YT/0.3TW SOPN Inject 0.75 mg into the skin once a week.     No current facility-administered medications for this visit.   Allergies:  Penicillins and Hydrocodone   ROS: No progressive palpitations, no syncope.  Physical Exam: VS:  BP (!) 142/72    Pulse (!) 102    Ht 5\' 2"  (1.575 m)    Wt 153 lb 12.8 oz (69.8 kg)    SpO2 96%    BMI 28.13 kg/m , BMI Body mass index is 28.13 kg/m.  Wt Readings from Last 3 Encounters:  05/30/21 153 lb 12.8 oz (69.8 kg)  01/24/21 152 lb (68.9 kg)  05/27/20 158 lb (71.7 kg)    General: Patient appears comfortable at rest. HEENT: Conjunctiva and lids normal, wearing a mask. Neck: Supple, no elevated JVP or carotid bruits, no thyromegaly. Lungs: Clear to auscultation, nonlabored breathing at rest. Cardiac: Regular rate and rhythm, no S3 or significant systolic murmur, no pericardial rub. Extremities: No pitting edema.  ECG:  An ECG dated 05/16/2020 was personally reviewed today and demonstrated:  Sinus rhythm.  Recent Labwork:  December 2021: Hemoglobin 13.2, platelets 181, potassium 4.0, BUN 15, creatinine 0.63, hemoglobin A1c 9.5%, TSH 1.772  Other Studies Reviewed  Today:  Echocardiogram 05/17/2020:  1. Left ventricular ejection fraction, by estimation, is 65 to 70%. The  left ventricle has normal function. The left ventricle has no regional  wall motion abnormalities. There is mild left ventricular hypertrophy.  Left ventricular diastolic parameters  are indeterminate.   2. Right ventricular systolic function is normal. The right ventricular  size is normal. Tricuspid regurgitation signal is inadequate for assessing  PA pressure.   3. The mitral valve is grossly normal. Trivial mitral valve  regurgitation.   4. The aortic valve is tricuspid. Aortic valve regurgitation is not  visualized. Mild  aortic valve sclerosis is present, with no evidence of  aortic valve stenosis.   5. The inferior vena cava is normal in size with greater than 50%  respiratory variability, suggesting right atrial pressure of 3 mmHg.   Lexiscan Myoview 05/23/2020: There was no ST segment deviation noted during stress. The study is normal. There are no perfusion defects consistent with prior infarct or current ischemia. This is a low risk study. The left ventricular ejection fraction is hyperdynamic (>65%).  Assessment and Plan:  1.  Essential hypertension and whitecoat hypertension.  She is currently on Norvasc with the recent addition of lisinopril by PCP.  She is tolerating both.  I have asked her to continue to track blood pressure with cuff at home.  2.  History of atypical chest pain without recurrence.  Ischemic testing from last year was low risk.  Follow-up ECG reviewed today.  Would focus on risk factor modification.  3.  Mixed hyperlipidemia, on Lipitor and omega-3 supplements.  She is following with Ms. Doss NP at this time.  Medication Adjustments/Labs and Tests Ordered: Current medicines are reviewed at length with the patient today.  Concerns regarding medicines are outlined above.   Tests Ordered: No orders of the defined types were placed in this  encounter.   Medication Changes: No orders of the defined types were placed in this encounter.   Disposition:  Follow up  1 year.  Signed, Satira Sark, MD, [facility] 05/30/2021 2:54 PM    [facility] [place], [place],  [address] Phone: [phone]; Fax: [phone]

## 2021-06-07 DIAGNOSIS — T7840XA Allergy, unspecified, initial encounter: Secondary | ICD-10-CM | POA: Diagnosis not present

## 2021-06-07 DIAGNOSIS — R251 Tremor, unspecified: Secondary | ICD-10-CM | POA: Diagnosis not present

## 2021-06-20 NOTE — Progress Notes (Signed)
[facility] Note  06/22/2021     CHIEF COMPLAINT Patient presents for Retina Follow Up  HISTORY OF PRESENT ILLNESS: Megan Schroeder is a 76 y.o. female who presents to the clinic today for:   HPI     Retina Follow Up   Patient presents with  Diabetic Retinopathy.  In both eyes.  Duration of 5 weeks.  Since onset it is stable.  I, the attending physician,  performed the HPI with the patient and updated documentation appropriately.        Comments   5 week follow up NPDR OU, ERM OU- Doing well.  No changes in vision.  Using Prolensa and Prednisolone QID OD. A1C 7.1 BS 140 this morning      Last edited by [name], MD on 06/22/2021  7:40 PM.      pt states   Referring physician: Lucia Gaskins, MD [place],  [place] [id number]  HISTORICAL INFORMATION:   Selected notes from the MEDICAL RECORD NUMBER Referred by Dr. [name]   CURRENT MEDICATIONS: Current Outpatient Medications (Ophthalmic Drugs)  Medication Sig   Bromfenac Sodium (PROLENSA) 0.07 % SOLN Place 1 drop into the right eye 4 (four) times daily.   prednisoLONE acetate (PRED FORTE) 1 % ophthalmic suspension Place 1 drop into the right eye 4 (four) times daily.   No current facility-administered medications for this visit. (Ophthalmic Drugs)   Current Outpatient Medications (Other)  Medication Sig   acetaminophen (TYLENOL) 325 MG tablet Take 2 tablets (650 mg total) by mouth every 6 (six) hours as needed for mild pain (or Fever >/= 101).   amLODipine (NORVASC) 10 MG tablet Take 1 tablet (10 mg total) by mouth daily. For BP   Ascorbic Acid (VITAMIN C) 1000 MG tablet Take 1,000 mg by mouth daily.   aspirin EC 81 MG tablet Take 1 tablet (81 mg total) by mouth daily with breakfast.   atorvastatin (LIPITOR) 20 MG tablet Take 1 tablet (20 mg total) by mouth every evening.   cholecalciferol (VITAMIN D) 1000 UNITS tablet Take 1,000 Units by mouth daily.    Coenzyme Q10  (COQ-10) 200 MG CAPS Take 200 mg by mouth daily.    dapagliflozin propanediol (FARXIGA) 10 MG TABS tablet Take 1 tablet (10 mg total) by mouth daily.   levothyroxine (SYNTHROID) 75 MCG tablet Take 1 tablet (75 mcg total) by mouth daily before breakfast.   lisinopril (ZESTRIL) 40 MG tablet Take 40 mg by mouth daily.   metFORMIN (GLUCOPHAGE) 1000 MG tablet Take 1 tablet (1,000 mg total) by mouth 2 (two) times daily with a meal.   Omega-3 Fatty Acids (FISH OIL) 1200 MG CAPS Take 1,200 mg by mouth every other day.    pantoprazole (PROTONIX) 40 MG tablet Take 1 tablet (40 mg total) by mouth daily.   TOUJEO SOLOSTAR 300 UNIT/ML SOPN Inject 50 Units into the skin at bedtime.    TRULICITY 1.76 HY/0.7PX SOPN Inject 0.75 mg into the skin once a week.   GNP ULTICARE PEN NEEDLES 31G X 5 MM MISC See admin instructions.   No current facility-administered medications for this visit. (Other)   REVIEW OF SYSTEMS: ROS   Positive for: Gastrointestinal, Endocrine, Cardiovascular, Eyes Negative for: Constitutional, Neurological, Skin, Genitourinary, Musculoskeletal, HENT, Respiratory, Psychiatric, Allergic/Imm, Heme/Lymph Last edited by Leonie Douglas, COA on 06/22/2021  8:10 AM.     ALLERGIES Allergies  Allergen Reactions   Penicillins Hives    Has patient had  a PCN reaction causing immediate rash, facial/tongue/throat swelling, SOB or lightheadedness with hypotension: No Has patient had a PCN reaction causing severe rash involving mucus membranes or skin necrosis: Yes Has patient had a PCN reaction that required hospitalization: No Has patient had a PCN reaction occurring within the last 10 years: No If all of the above answers are "NO", then may proceed with Cephalosporin use.     Hydrocodone Nausea And Vomiting   PAST MEDICAL HISTORY Past Medical History:  Diagnosis Date   Anxiety    Diabetic retinopathy ([place])    NPDR OU   Hyperlipidemia    Hypertension    Hypertensive retinopathy    OU    Hypothyroidism    Type 2 diabetes mellitus ([place])    Past Surgical History:  Procedure Laterality Date   BREAST EXCISIONAL BIOPSY Left    CATARACT EXTRACTION W/PHACO Left 12/16/2017   Procedure: CATARACT EXTRACTION PHACO AND INTRAOCULAR LENS PLACEMENT ([place]);  Surgeon: [name], MD;  Location: [facility];  Service: Ophthalmology;  Laterality: Left;  CDE: 11.65   CATARACT EXTRACTION W/PHACO Right 12/30/2017   Procedure: CATARACT EXTRACTION PHACO AND INTRAOCULAR LENS PLACEMENT RIGHT EYE;  Surgeon: [name], MD;  Location: [facility];  Service: Ophthalmology;  Laterality: Right;  CDE: 9.07   COLONOSCOPY N/A 09/15/2015   Dr. Dudley Major multiple rectal and colonic polyps removed. Hepatic flexure with 9 mm polyp, multiple 5-7 mm polyps. Multiple cecal polyps with largest 1.5 cm, one 5 mm polyp in rectum. Path for colonic polyps with sessile serrated polyp without dysplasia, no high grade dysplasia, tubular adenomas, rectal hyperplastic polyp   COLONOSCOPY WITH PROPOFOL N/A 07/20/2019   Procedure: COLONOSCOPY WITH PROPOFOL;  Surgeon: [name], MD;  Location: [facility];  Service: Endoscopy;  Laterality: N/A;  8:30am   EYE SURGERY Bilateral 2019   Cat Sx - Dr. [name]   LEFT HEART CATHETERIZATION WITH CORONARY ANGIOGRAM N/A 07/23/2011   Procedure: LEFT HEART CATHETERIZATION WITH CORONARY ANGIOGRAM;  Surgeon: [name], MD;  Location: [facility] CATH LAB;  Service: Cardiovascular;  Laterality: N/A;   POLYPECTOMY  07/20/2019   Procedure: POLYPECTOMY;  Surgeon: [name], MD;  Location: [facility];  Service: Endoscopy;;   YAG LASER APPLICATION Right 56/38/9373   Dr. [name]   YAG LASER APPLICATION Left 42/87/6811   Dr. [name]   FAMILY HISTORY Family History  Problem Relation Age of Onset   Cancer Father    Diabetes Father    Colon cancer Neg Hx     SOCIAL HISTORY Social History   Tobacco Use   Smoking status: Never   Smokeless tobacco: Never  Substance Use Topics   Alcohol use:  No   Drug use: No       OPHTHALMIC EXAM:  Base Eye Exam     Visual Acuity (Snellen - Linear)       Right Left   Dist cc 20/40 -2 20/25   Dist ph cc NI 20/25 +1    Correction: Glasses         Tonometry (Tonopen, 8:20 AM)       Right Left   Pressure 15 15         Pupils       Dark Light Shape React APD   Right 3 2 Round Brisk None   Left 3 2 Round Brisk None         Visual Fields (Counting fingers)       Left Right  Full Full         Extraocular Movement       Right Left    Full Full         Neuro/Psych     Oriented x3: Yes   Mood/Affect: Normal         Dilation     Both eyes: 1.0% Mydriacyl, 2.5% Phenylephrine @ 8:20 AM           Slit Lamp and Fundus Exam     Slit Lamp Exam       Right Left   Lids/Lashes Dermatochalasis - upper lid, mild MGD Dermatochalasis - upper lid, mild MGD   Conjunctiva/Sclera white and quiet white and quiet   Cornea 1-2+PEE, well healed temporal cataract wound 1-2+PEE, well healed temporal cataract wound, decreased TBUT, mild tear film debris   Anterior Chamber deep and clear deep and clear   Iris round and dilated round and dilated   Lens PC IOL in good position, open PC PC IOL in good position with open PC   Anterior Vitreous syneresis, PVD syneresis, Posterior vitreous detachment, vitreous condensations         Fundus Exam       Right Left   Disc pink and sharp, compact, mild PPA pink and sharp, compact, mild PPP   C/D Ratio 0.1 0.1   Macula flat, blunted foveal reflex, ERM with striae inferiorly, punctate IRH, +central cyst - slightly improved flat, blunted foveal reflex, mild ERM, rare MA, no edema   Vessels attenuated, tortuous greatest IT arcades attenuated, tortuous   Periphery attached, scattered MA / DBH greatest superior to disc attached, rare MA/DBH, no RT/RD 360           Refraction     Wearing Rx       Sphere Cylinder Axis Add   Right [place]   +2.50   Left -0.25 +0.50 177  +2.50         Manifest Refraction       Sphere Dist VA   Right [place] NI   Left              IMAGING AND PROCEDURES  Imaging and Procedures for 06/22/2021  OCT, Retina - OU - Both Eyes       Right Eye Quality was good. Central Foveal Thickness: 464. Progression has improved. Findings include abnormal foveal contour, no SRF, intraretinal fluid, epiretinal membrane, macular pucker (Persistent ERM with central cystic changes -- slightly improved).   Left Eye Quality was good. Central Foveal Thickness: 264. Progression has been stable. Findings include normal foveal contour, no SRF, epiretinal membrane, no IRF.   Notes *Images captured and stored on drive  Diagnosis / Impression:  OD: Persistent ERM with central cystic changes -- slightly improved OS: mild ERM with early pucker, partial PVD   Clinical management:  See below  Abbreviations: NFP - Normal foveal profile. CME - cystoid macular edema. PED - pigment epithelial detachment. IRF - intraretinal fluid. SRF - subretinal fluid. EZ - ellipsoid zone. ERM - epiretinal membrane. ORA - outer retinal atrophy. ORT - outer retinal tubulation. SRHM - subretinal hyper-reflective material. IRHM - intraretinal hyper-reflective material            ASSESSMENT/PLAN:    ICD-10-CM   1. Epiretinal membrane (ERM) of both eyes  H35.373     2. 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 Essential hypertension  I10     4. Hypertensive retinopathy of both eyes  H35.033     5. Pseudophakia of both eyes  Z96.1      1. Epiretinal membrane OU -- stable  - BCVA OD 20/40; OS 20/25 -- decreased OU  - OCT shows persistent ERM with central cystic changes -- slightly improved  - IRF: ?DME vs CME vs cystic changes  - started PF and Prolensa QID OD only for possible CME component on 12.8.22 -- continue  - no  metamorphopsia  - monitor for now   - f/u 5 weeks -- DFE/OCT  2. Mild nonproliferative diabetic retinopathy OU - BCVA OD: 20/40, OS: 20/25 -- decreased OU - exam shows scattered MA, no NV - OCT without diabetic macular edema OS -- ?+DME OD - discussed possible intravitreal injection to treat possible DME component -- will hold off for now - f/u in 5 wks -- DFE/OCT  3,4. Hypertensive retinopathy OU - discussed importance of tight BP control - monitor   5. Pseudophakia OU  - s/p CE/IOL OU, 2019 (Dr. Geoffry Paradise)  - IOLs in good position, doing well  - s/p YAG cap OD 3.24.22  - s/p YAG cap OS 04.07.22  - monitor   Ophthalmic Meds Ordered this visit:  Meds ordered this encounter  Medications   prednisoLONE acetate (PRED FORTE) 1 % ophthalmic suspension    Sig: Place 1 drop into the right eye 4 (four) times daily.    Dispense:  10 mL    Refill:  2     Return in about 5 weeks (around 07/27/2021) for f/u ERM OU, DFE, OCT.  There are no Patient Instructions on file for this visit.  This document serves as a record of services personally performed by Gardiner Sleeper, MD, PhD. It was created on their behalf by San Jetty. Owens Shark, OA an ophthalmic technician. The creation of this record is the provider's dictation and/or activities during the visit.    Electronically signed by: San Jetty. Owens Shark, [place] 01.10.2023 7:43 PM  Gardiner Sleeper, M.D., Ph.D. Diseases & Surgery of the Retina and Vitreous [facility]  I have reviewed the above documentation for accuracy and completeness, and I agree with the above. Gardiner Sleeper, M.D., Ph.D. 06/22/21 7:43 PM  Abbreviations: M myopia (nearsighted); A astigmatism; H hyperopia (farsighted); P presbyopia; Mrx spectacle prescription;  CTL contact lenses; OD right eye; OS left eye; OU both eyes  XT exotropia; ET esotropia; PEK punctate epithelial keratitis; PEE punctate epithelial erosions; DES dry eye syndrome; MGD meibomian gland  dysfunction; ATs artificial tears; PFAT's preservative free artificial tears; [place] nuclear sclerotic cataract; PSC posterior subcapsular cataract; ERM epi-retinal membrane; PVD posterior vitreous detachment; RD retinal detachment; DM diabetes mellitus; DR diabetic retinopathy; NPDR non-proliferative diabetic retinopathy; PDR proliferative diabetic retinopathy; CSME clinically significant macular edema; DME diabetic macular edema; dbh dot blot hemorrhages; CWS cotton wool spot; POAG primary open angle glaucoma; C/D cup-to-disc ratio; HVF humphrey visual field; GVF goldmann visual field; OCT optical coherence tomography; IOP intraocular pressure; BRVO Branch retinal vein occlusion; CRVO central retinal vein occlusion; CRAO central retinal artery occlusion; BRAO branch retinal artery occlusion; RT retinal tear; SB scleral buckle; PPV pars plana vitrectomy; VH Vitreous hemorrhage; PRP panretinal laser photocoagulation; IVK intravitreal kenalog; VMT vitreomacular traction; MH Macular hole;  NVD neovascularization of the disc; NVE neovascularization elsewhere; AREDS age related eye disease study; ARMD age related macular degeneration; POAG primary open  angle glaucoma; EBMD epithelial/anterior basement membrane dystrophy; ACIOL anterior chamber intraocular lens; IOL intraocular lens; PCIOL posterior chamber intraocular lens; Phaco/IOL phacoemulsification with intraocular lens placement; [place] photorefractive keratectomy; LASIK laser assisted in situ keratomileusis; HTN hypertension; DM diabetes mellitus; COPD chronic obstructive pulmonary disease

## 2021-06-22 ENCOUNTER — Other Ambulatory Visit: Payer: Self-pay

## 2021-06-22 ENCOUNTER — Ambulatory Visit (INDEPENDENT_AMBULATORY_CARE_PROVIDER_SITE_OTHER): Payer: Medicare Other | Admitting: Ophthalmology

## 2021-06-22 ENCOUNTER — Encounter (INDEPENDENT_AMBULATORY_CARE_PROVIDER_SITE_OTHER): Payer: Self-pay | Admitting: Ophthalmology

## 2021-06-22 DIAGNOSIS — Z961 Presence of intraocular lens: Secondary | ICD-10-CM | POA: Diagnosis not present

## 2021-06-22 DIAGNOSIS — E113393 Type 2 diabetes mellitus with moderate nonproliferative diabetic retinopathy without macular edema, bilateral: Secondary | ICD-10-CM

## 2021-06-22 DIAGNOSIS — H35033 Hypertensive retinopathy, bilateral: Secondary | ICD-10-CM | POA: Diagnosis not present

## 2021-06-22 DIAGNOSIS — I10 Essential (primary) hypertension: Secondary | ICD-10-CM

## 2021-06-22 DIAGNOSIS — H35373 Puckering of macula, bilateral: Secondary | ICD-10-CM | POA: Diagnosis not present

## 2021-06-22 MED ORDER — PREDNISOLONE ACETATE 1 % OP SUSP: 1.0000 [drp] | Freq: Four times a day (QID) | OPHTHALMIC | 2 refills | Status: DC

## 2021-07-20 DIAGNOSIS — E119 Type 2 diabetes mellitus without complications: Secondary | ICD-10-CM | POA: Diagnosis not present

## 2021-07-20 DIAGNOSIS — E039 Hypothyroidism, unspecified: Secondary | ICD-10-CM | POA: Diagnosis not present

## 2021-07-20 DIAGNOSIS — I10 Essential (primary) hypertension: Secondary | ICD-10-CM | POA: Diagnosis not present

## 2021-07-20 NOTE — Progress Notes (Addendum)
[facility] Note  07/27/2021     CHIEF COMPLAINT Patient presents for Retina Follow Up  HISTORY OF PRESENT ILLNESS: Megan Schroeder is a 76 y.o. female who presents to the clinic today for:   HPI     Retina Follow Up   Patient presents with  Other (ERM).  In both eyes.  Severity is moderate.  Duration of 5 weeks.  Since onset it is stable.  I, the attending physician,  performed the HPI with the patient and updated documentation appropriately.        Comments   Patient states vision the same OU. Using Pred Forte and prolensa qid OD only for possible CME component to ERM. BS was 101 this am. Last a1c was 8.0, checked this week.       Last edited by [name], MD on 07/27/2021  9:38 AM.    pt states right eye vision is not as good, she states her trulicity was increased  Referring physician: Celestia Khat, [place],  [place] [address]  HISTORICAL INFORMATION:   Selected notes from the MEDICAL RECORD NUMBER Referred by Dr. [name]   CURRENT MEDICATIONS: Current Outpatient Medications (Ophthalmic Drugs)  Medication Sig   Bromfenac Sodium (PROLENSA) 0.07 % SOLN Place 1 drop into the right eye 4 (four) times daily.   prednisoLONE acetate (PRED FORTE) 1 % ophthalmic suspension Place 1 drop into the right eye 4 (four) times daily.   No current facility-administered medications for this visit. (Ophthalmic Drugs)   Current Outpatient Medications (Other)  Medication Sig   acetaminophen (TYLENOL) 325 MG tablet Take 2 tablets (650 mg total) by mouth every 6 (six) hours as needed for mild pain (or Fever >/= 101).   amLODipine (NORVASC) 10 MG tablet Take 1 tablet (10 mg total) by mouth daily. For BP   Ascorbic Acid (VITAMIN C) 1000 MG tablet Take 1,000 mg by mouth daily.   aspirin EC 81 MG tablet Take 1 tablet (81 mg total) by mouth daily with breakfast.   atorvastatin (LIPITOR) 20 MG tablet Take 1 tablet (20 mg total) by mouth every evening.    cholecalciferol (VITAMIN D) 1000 UNITS tablet Take 1,000 Units by mouth daily.    Coenzyme Q10 (COQ-10) 200 MG CAPS Take 200 mg by mouth daily.    dapagliflozin propanediol (FARXIGA) 10 MG TABS tablet Take 1 tablet (10 mg total) by mouth daily.   GNP ULTICARE PEN NEEDLES 31G X 5 MM MISC See admin instructions.   levothyroxine (SYNTHROID) 75 MCG tablet Take 1 tablet (75 mcg total) by mouth daily before breakfast.   lisinopril (ZESTRIL) 40 MG tablet Take 40 mg by mouth daily.   metFORMIN (GLUCOPHAGE) 1000 MG tablet Take 1 tablet (1,000 mg total) by mouth 2 (two) times daily with a meal.   Omega-3 Fatty Acids (FISH OIL) 1200 MG CAPS Take 1,200 mg by mouth every other day.    pantoprazole (PROTONIX) 40 MG tablet Take 1 tablet (40 mg total) by mouth daily.   TOUJEO SOLOSTAR 300 UNIT/ML SOPN Inject 50 Units into the skin at bedtime.    TRULICITY 8.65 HQ/4.6NG SOPN Inject 0.75 mg into the skin once a week.   No current facility-administered medications for this visit. (Other)   REVIEW OF SYSTEMS: ROS   Positive for: Gastrointestinal, Endocrine, Cardiovascular, Eyes Negative for: Constitutional, Neurological, Skin, Genitourinary, Musculoskeletal, HENT, Respiratory, Psychiatric, Allergic/Imm, Heme/Lymph Last edited by Roselee Nova D, COT on 07/27/2021  8:18 AM.  ALLERGIES Allergies  Allergen Reactions   Penicillins Hives    Has patient had a PCN reaction causing immediate rash, facial/tongue/throat swelling, SOB or lightheadedness with hypotension: No Has patient had a PCN reaction causing severe rash involving mucus membranes or skin necrosis: Yes Has patient had a PCN reaction that required hospitalization: No Has patient had a PCN reaction occurring within the last 10 years: No If all of the above answers are "NO", then may proceed with Cephalosporin use.     Hydrocodone Nausea And Vomiting   PAST MEDICAL HISTORY Past Medical History:  Diagnosis Date   Anxiety    Diabetic  retinopathy ([place])    NPDR OU   Hyperlipidemia    Hypertension    Hypertensive retinopathy    OU   Hypothyroidism    Type 2 diabetes mellitus ([place])    Past Surgical History:  Procedure Laterality Date   BREAST EXCISIONAL BIOPSY Left    CATARACT EXTRACTION W/PHACO Left 12/16/2017   Procedure: CATARACT EXTRACTION PHACO AND INTRAOCULAR LENS PLACEMENT ([place]);  Surgeon: [name], MD;  Location: [facility];  Service: Ophthalmology;  Laterality: Left;  CDE: 11.65   CATARACT EXTRACTION W/PHACO Right 12/30/2017   Procedure: CATARACT EXTRACTION PHACO AND INTRAOCULAR LENS PLACEMENT RIGHT EYE;  Surgeon: [name], MD;  Location: [facility];  Service: Ophthalmology;  Laterality: Right;  CDE: 9.07   COLONOSCOPY N/A 09/15/2015   Dr. Dudley Major multiple rectal and colonic polyps removed. Hepatic flexure with 9 mm polyp, multiple 5-7 mm polyps. Multiple cecal polyps with largest 1.5 cm, one 5 mm polyp in rectum. Path for colonic polyps with sessile serrated polyp without dysplasia, no high grade dysplasia, tubular adenomas, rectal hyperplastic polyp   COLONOSCOPY WITH PROPOFOL N/A 07/20/2019   Procedure: COLONOSCOPY WITH PROPOFOL;  Surgeon: [name], MD;  Location: [facility];  Service: Endoscopy;  Laterality: N/A;  8:30am   EYE SURGERY Bilateral 2019   Cat Sx - Dr. [name]   LEFT HEART CATHETERIZATION WITH CORONARY ANGIOGRAM N/A 07/23/2011   Procedure: LEFT HEART CATHETERIZATION WITH CORONARY ANGIOGRAM;  Surgeon: [name], MD;  Location: [facility] CATH LAB;  Service: Cardiovascular;  Laterality: N/A;   POLYPECTOMY  07/20/2019   Procedure: POLYPECTOMY;  Surgeon: [name], MD;  Location: [facility];  Service: Endoscopy;;   YAG LASER APPLICATION Right 94/85/4627   Dr. [name]   YAG LASER APPLICATION Left 03/50/0938   Dr. [name]   FAMILY HISTORY Family History  Problem Relation Age of Onset   Cancer Father    Diabetes Father    Colon cancer Neg Hx    SOCIAL HISTORY Social History    Tobacco Use   Smoking status: Never   Smokeless tobacco: Never  Substance Use Topics   Alcohol use: No   Drug use: No       OPHTHALMIC EXAM:  Base Eye Exam     Visual Acuity (Snellen - Linear)       Right Left   Dist cc 20/50 +1 20/20 -2   Dist ph cc NI          Tonometry (Tonopen, 8:28 AM)       Right Left   Pressure 20 20         Pupils       Dark Light Shape React APD   Right 3 2 Round Brisk None   Left 3 2 Round Brisk None         Visual Fields (Counting fingers)  Left Right    Full Full         Extraocular Movement       Right Left    Full, Ortho Full, Ortho         Neuro/Psych     Oriented x3: Yes   Mood/Affect: Normal         Dilation     Both eyes: 1.0% Mydriacyl, 2.5% Phenylephrine @ 8:28 AM           Slit Lamp and Fundus Exam     Slit Lamp Exam       Right Left   Lids/Lashes Dermatochalasis - upper lid, mild MGD Dermatochalasis - upper lid, mild MGD   Conjunctiva/Sclera white and quiet white and quiet   Cornea 1+PEE, well healed temporal cataract wound 1+PEE, well healed temporal cataract wound, decreased TBUT, mild tear film debris   Anterior Chamber deep and clear deep and clear   Iris round and dilated, No NVI round and dilated, No NVI   Lens PC IOL in good position, open PC PC IOL in good position with open PC   Anterior Vitreous syneresis, PVD syneresis, Posterior vitreous detachment, vitreous condensations         Fundus Exam       Right Left   Disc pink and sharp, compact, mild PPA pink and sharp, compact, mild PPP   C/D Ratio 0.3 0.1   Macula flat, blunted foveal reflex, ERM with striae inferiorly, punctate IRH, +central cyst / edema - slightly increased flat, blunted foveal reflex, mild ERM, rare MA, no edema   Vessels attenuated, tortuous greatest IT arcades attenuated, tortuous   Periphery attached, scattered MA / DBH greatest superior to disc attached, rare MA/DBH, no RT/RD 360            Refraction     Wearing Rx       Sphere Cylinder Axis Add   Right [place]   +2.50   Left -0.25 +0.50 177 +2.50           IMAGING AND PROCEDURES  Imaging and Procedures for 07/27/2021  OCT, Retina - OU - Both Eyes       Right Eye Quality was good. Central Foveal Thickness: 509. Progression has worsened. Findings include abnormal foveal contour, no SRF, intraretinal fluid, epiretinal membrane, macular pucker (Persistent ERM with interval increase in cystic changes -- +DME component).   Left Eye Quality was good. Central Foveal Thickness: 263. Progression has been stable. Findings include normal foveal contour, no SRF, epiretinal membrane, no IRF.   Notes *Images captured and stored on drive  Diagnosis / Impression:  OD: Persistent ERM with interval increase in cystic changes -- +DME component OS: mild ERM with early pucker, partial PVD   Clinical management:  See below  Abbreviations: NFP - Normal foveal profile. CME - cystoid macular edema. PED - pigment epithelial detachment. IRF - intraretinal fluid. SRF - subretinal fluid. EZ - ellipsoid zone. ERM - epiretinal membrane. ORA - outer retinal atrophy. ORT - outer retinal tubulation. SRHM - subretinal hyper-reflective material. IRHM - intraretinal hyper-reflective material      Intravitreal Injection, Pharmacologic Agent - OD - Right Eye       Time Out 07/27/2021. 8:53 AM. Confirmed correct patient, procedure, site, and patient consented.   Anesthesia Topical anesthesia was used. Anesthetic medications included Lidocaine 2%, Proparacaine 0.5%.   Procedure Preparation included 5% betadine to ocular surface, eyelid speculum. A supplied needle was used.   Injection: 1.25 mg Bevacizumab  1.25mg /0.72ml   Route: Intravitreal, Site: Right Eye   NDC: H061816, Lot: [id number] , Expiration date: 08/23/2021   Post-op Post injection exam found visual acuity of at least counting fingers. The patient tolerated the procedure  well. There were no complications. The patient received written and verbal post procedure care education. Post injection medications were not given.            ASSESSMENT/PLAN:    ICD-10-CM   1. Epiretinal membrane (ERM) of both eyes  H35.373 OCT, Retina - OU - Both Eyes    2. Moderate nonproliferative diabetic retinopathy of both eyes with macular edema associated with type 2 diabetes mellitus (HCC)  E11.3313 OCT, Retina - OU - Both Eyes    Intravitreal Injection, Pharmacologic Agent - OD - Right Eye    Bevacizumab (AVASTIN) SOLN 1.25 mg    3. Essential hypertension  I10     4. Hypertensive retinopathy of both eyes  H35.033     5. Pseudophakia of both eyes  Z96.1      1. Epiretinal membrane OU -- stable  - BCVA OD 20/50 from 20/40; OS 20/20 from 20/25  - OCT shows OD persistent ERM with central cystic changes -- slightly increased  - IRF OD: ?DME vs CME vs cystic changes  - started PF and Prolensa QID OD only for possible CME component on 12.8.22 -- continue, but suspect DME may be significant component  - no metamorphopsia  - monitor for now   - f/u 4 weeks -- DFE/OCT  2. Moderate nonproliferative diabetic retinopathy OU - BCVA OD: 20/50, OS: 20/20 -- decreased OD - exam shows scattered MA, no NV - OCT without diabetic macular edema OS; OD: interval increase in cystic changes -- suspect DME component - recommend IVA OD #1 today, 02.16.23 for DME component - pt wishes to proceed - RBA of procedure discussed, questions answered - IVA informed consent obtained and signed, 02.16.23 (OD) - see procedure note - f/u in 4 wks -- DFE/OCT  3,4. Hypertensive retinopathy OU - discussed importance of tight BP control - monitor   5. Pseudophakia OU  - s/p CE/IOL OU, 2019 (Dr. 2020)  - IOLs in good position, doing well  - s/p YAG cap OD 3.24.22  - s/p YAG cap OS 04.07.22  - monitor   Ophthalmic Meds Ordered this visit:  Meds ordered this encounter  Medications    Bevacizumab (AVASTIN) SOLN 1.25 mg     Return in about 4 weeks (around 08/24/2021) for f/u NPDR OU, DFE, OCT.  There are no Patient Instructions on file for this visit.  This document serves as a record of services personally performed by 09/06/2021, MD, PhD. It was created on their behalf by Gardiner Sleeper. San Jetty, OA an ophthalmic technician. The creation of this record is the provider's dictation and/or activities during the visit.    Electronically signed by: Owens Shark. San Jetty, Owens Shark 02.09.2023 10:37 AM  15.09.2023, M.D., Ph.D. Diseases & Surgery of the Retina and Vitreous [facility]  I have reviewed the above documentation for accuracy and completeness, and I agree with the above. 3[facility], M.D., Ph.D. 07/27/21 10:37 AM   Abbreviations: M myopia (nearsighted); A astigmatism; H hyperopia (farsighted); P presbyopia; Mrx spectacle prescription;  CTL contact lenses; OD right eye; OS left eye; OU both eyes  XT exotropia; ET esotropia; PEK punctate epithelial keratitis; PEE punctate epithelial erosions; DES dry eye syndrome; MGD meibomian gland dysfunction; ATs artificial tears;  PFAT's preservative free artificial tears; [place] nuclear sclerotic cataract; PSC posterior subcapsular cataract; ERM epi-retinal membrane; PVD posterior vitreous detachment; RD retinal detachment; DM diabetes mellitus; DR diabetic retinopathy; NPDR non-proliferative diabetic retinopathy; PDR proliferative diabetic retinopathy; CSME clinically significant macular edema; DME diabetic macular edema; dbh dot blot hemorrhages; CWS cotton wool spot; POAG primary open angle glaucoma; C/D cup-to-disc ratio; HVF humphrey visual field; GVF goldmann visual field; OCT optical coherence tomography; IOP intraocular pressure; BRVO Branch retinal vein occlusion; CRVO central retinal vein occlusion; CRAO central retinal artery occlusion; BRAO branch retinal artery occlusion; RT retinal tear; SB scleral buckle; PPV  pars plana vitrectomy; VH Vitreous hemorrhage; PRP panretinal laser photocoagulation; IVK intravitreal kenalog; VMT vitreomacular traction; MH Macular hole;  NVD neovascularization of the disc; NVE neovascularization elsewhere; AREDS age related eye disease study; ARMD age related macular degeneration; POAG primary open angle glaucoma; EBMD epithelial/anterior basement membrane dystrophy; ACIOL anterior chamber intraocular lens; IOL intraocular lens; PCIOL posterior chamber intraocular lens; Phaco/IOL phacoemulsification with intraocular lens placement; [place] photorefractive keratectomy; LASIK laser assisted in situ keratomileusis; HTN hypertension; DM diabetes mellitus; COPD chronic obstructive pulmonary disease

## 2021-07-24 ENCOUNTER — Other Ambulatory Visit (HOSPITAL_COMMUNITY): Payer: Self-pay | Admitting: Family Medicine

## 2021-07-24 DIAGNOSIS — E785 Hyperlipidemia, unspecified: Secondary | ICD-10-CM | POA: Diagnosis not present

## 2021-07-24 DIAGNOSIS — R1011 Right upper quadrant pain: Secondary | ICD-10-CM

## 2021-07-24 DIAGNOSIS — E039 Hypothyroidism, unspecified: Secondary | ICD-10-CM | POA: Diagnosis not present

## 2021-07-24 DIAGNOSIS — E119 Type 2 diabetes mellitus without complications: Secondary | ICD-10-CM | POA: Diagnosis not present

## 2021-07-24 DIAGNOSIS — I10 Essential (primary) hypertension: Secondary | ICD-10-CM | POA: Diagnosis not present

## 2021-07-25 DIAGNOSIS — M79676 Pain in unspecified toe(s): Secondary | ICD-10-CM | POA: Diagnosis not present

## 2021-07-25 DIAGNOSIS — E1142 Type 2 diabetes mellitus with diabetic polyneuropathy: Secondary | ICD-10-CM | POA: Diagnosis not present

## 2021-07-25 DIAGNOSIS — L84 Corns and callosities: Secondary | ICD-10-CM | POA: Diagnosis not present

## 2021-07-25 DIAGNOSIS — B351 Tinea unguium: Secondary | ICD-10-CM | POA: Diagnosis not present

## 2021-07-27 ENCOUNTER — Other Ambulatory Visit: Payer: Self-pay

## 2021-07-27 ENCOUNTER — Encounter (INDEPENDENT_AMBULATORY_CARE_PROVIDER_SITE_OTHER): Payer: Self-pay | Admitting: Ophthalmology

## 2021-07-27 ENCOUNTER — Ambulatory Visit (INDEPENDENT_AMBULATORY_CARE_PROVIDER_SITE_OTHER): Payer: Medicare Other | Admitting: Ophthalmology

## 2021-07-27 DIAGNOSIS — I10 Essential (primary) hypertension: Secondary | ICD-10-CM

## 2021-07-27 DIAGNOSIS — E113393 Type 2 diabetes mellitus with moderate nonproliferative diabetic retinopathy without macular edema, bilateral: Secondary | ICD-10-CM

## 2021-07-27 DIAGNOSIS — H35033 Hypertensive retinopathy, bilateral: Secondary | ICD-10-CM

## 2021-07-27 DIAGNOSIS — H35373 Puckering of macula, bilateral: Secondary | ICD-10-CM

## 2021-07-27 DIAGNOSIS — Z961 Presence of intraocular lens: Secondary | ICD-10-CM

## 2021-07-27 DIAGNOSIS — E113313 Type 2 diabetes mellitus with moderate nonproliferative diabetic retinopathy with macular edema, bilateral: Secondary | ICD-10-CM | POA: Diagnosis not present

## 2021-07-27 MED ORDER — BEVACIZUMAB CHEMO INJECTION 1.25MG/0.05ML SYRINGE FOR KALEIDOSCOPE
1.2500 mg | INTRAVITREAL | Status: AC | PRN
  Administered 2021-07-27: 1.25 mg via INTRAVITREAL

## 2021-08-03 ENCOUNTER — Ambulatory Visit (HOSPITAL_COMMUNITY)
Admission: RE | Admit: 2021-08-03 | Discharge: 2021-08-03 | Disposition: A | Discharge status: Home / Self Care | Payer: Medicare Other | Source: Ambulatory Visit | Attending: Family Medicine | Admitting: Family Medicine

## 2021-08-03 ENCOUNTER — Other Ambulatory Visit: Payer: Self-pay

## 2021-08-03 DIAGNOSIS — R1011 Right upper quadrant pain: Secondary | ICD-10-CM | POA: Diagnosis not present

## 2021-08-03 DIAGNOSIS — K760 Fatty (change of) liver, not elsewhere classified: Secondary | ICD-10-CM | POA: Diagnosis not present

## 2021-08-23 NOTE — Progress Notes (Signed)
?[facility] Note ? ?08/28/2021 ? ?  ? ?CHIEF COMPLAINT ?Patient presents for Retina Follow Up ? ?HISTORY OF PRESENT ILLNESS: ?Megan Schroeder is a 76 y.o. female who presents to the clinic today for:  ? ?HPI   ? ? Retina Follow Up   ?Patient presents with  Other.  In both eyes.  This started 4 weeks ago.  I, the attending physician,  performed the HPI with the patient and updated documentation appropriately. ? ?  ?  ? ? Comments   ?Patient here for 4 weeks retina follow up for ERM OU. Patient states vision doing better. No eye pain.  ? ?  ?  ?Last edited by [name], MD on 08/28/2021  8:51 AM.  ?  ? ?pt states ? ?Referring physician: ?[name], FNP ?[address] ?[place],  [place] [address] ? ?HISTORICAL INFORMATION:  ? ?Selected notes from the [facility] ?Referred by Dr. [name]  ? ?CURRENT MEDICATIONS: ?Current Outpatient Medications (Ophthalmic Drugs)  ?Medication Sig  ? Bromfenac Sodium (PROLENSA) 0.07 % SOLN Place 1 drop into the right eye 4 (four) times daily.  ? prednisoLONE acetate (PRED FORTE) 1 % ophthalmic suspension Place 1 drop into the right eye 4 (four) times daily.  ? ?No current facility-administered medications for this visit. (Ophthalmic Drugs)  ? ?Current Outpatient Medications (Other)  ?Medication Sig  ? acetaminophen (TYLENOL) 325 MG tablet Take 2 tablets (650 mg total) by mouth every 6 (six) hours as needed for mild pain (or Fever >/= 101).  ? amLODipine (NORVASC) 10 MG tablet Take 1 tablet (10 mg total) by mouth daily. For BP  ? Ascorbic Acid (VITAMIN C) 1000 MG tablet Take 1,000 mg by mouth daily.  ? aspirin EC 81 MG tablet Take 1 tablet (81 mg total) by mouth daily with breakfast.  ? atorvastatin (LIPITOR) 20 MG tablet Take 1 tablet (20 mg total) by mouth every evening.  ? cholecalciferol (VITAMIN D) 1000 UNITS tablet Take 1,000 Units by mouth daily.   ? Coenzyme Q10 (COQ-10) 200 MG CAPS Take 200 mg by mouth daily.   ? dapagliflozin  propanediol (FARXIGA) 10 MG TABS tablet Take 1 tablet (10 mg total) by mouth daily.  ? GNP ULTICARE PEN NEEDLES 31G X 5 MM MISC See admin instructions.  ? levothyroxine (SYNTHROID) 75 MCG tablet Take 1 tablet (75 mcg total) by mouth daily before breakfast.  ? lisinopril (ZESTRIL) 40 MG tablet Take 40 mg by mouth daily.  ? metFORMIN (GLUCOPHAGE) 1000 MG tablet Take 1 tablet (1,000 mg total) by mouth 2 (two) times daily with a meal.  ? Omega-3 Fatty Acids (FISH OIL) 1200 MG CAPS Take 1,200 mg by mouth every other day.   ? pantoprazole (PROTONIX) 40 MG tablet Take 1 tablet (40 mg total) by mouth daily.  ? TOUJEO SOLOSTAR 300 UNIT/ML SOPN Inject 50 Units into the skin at bedtime.   ? TRULICITY 0.26 VZ/8.5YI SOPN Inject 0.75 mg into the skin once a week.  ? ?No current facility-administered medications for this visit. (Other)  ? ?REVIEW OF SYSTEMS: ?ROS   ?Positive for: Gastrointestinal, Endocrine, Cardiovascular, Eyes ?Negative for: Constitutional, Neurological, Skin, Genitourinary, Musculoskeletal, HENT, Respiratory, Psychiatric, Allergic/Imm, Heme/Lymph ?Last edited by Theodore Demark, COA on 08/28/2021  8:16 AM.  ?  ? ? ?ALLERGIES ?Allergies  ?Allergen Reactions  ? Penicillins Hives  ?  Has patient had a PCN reaction causing immediate rash, facial/tongue/throat swelling, SOB or lightheadedness with hypotension:  No ?Has patient had a PCN reaction causing severe rash involving mucus membranes or skin necrosis: Yes ?Has patient had a PCN reaction that required hospitalization: No ?Has patient had a PCN reaction occurring within the last 10 years: No ?If all of the above answers are "NO", then may proceed with Cephalosporin use. ? ?  ? Hydrocodone Nausea And Vomiting  ? ?PAST MEDICAL HISTORY ?Past Medical History:  ?Diagnosis Date  ? Anxiety   ? Diabetic retinopathy ([place])   ? NPDR OU  ? Hyperlipidemia   ? Hypertension   ? Hypertensive retinopathy   ? OU  ? Hypothyroidism   ? Type 2 diabetes mellitus ([place])   ? ?Past  Surgical History:  ?Procedure Laterality Date  ? BREAST EXCISIONAL BIOPSY Left   ? CATARACT EXTRACTION W/PHACO Left 12/16/2017  ? Procedure: CATARACT EXTRACTION PHACO AND INTRAOCULAR LENS PLACEMENT ([facility]);  Surgeon: [name], MD;  Location: [facility];  Service: Ophthalmology;  Laterality: Left;  CDE: 11.65  ? CATARACT EXTRACTION W/PHACO Right 12/30/2017  ? Procedure: CATARACT EXTRACTION PHACO AND INTRAOCULAR LENS PLACEMENT RIGHT EYE;  Surgeon: [name], MD;  Location: [facility];  Service: Ophthalmology;  Laterality: Right;  CDE: 9.07  ? COLONOSCOPY N/A 09/15/2015  ? Dr. Dudley Major multiple rectal and colonic polyps removed. Hepatic flexure with 9 mm polyp, multiple 5-7 mm polyps. Multiple cecal polyps with largest 1.5 cm, one 5 mm polyp in rectum. Path for colonic polyps with sessile serrated polyp without dysplasia, no high grade dysplasia, tubular adenomas, rectal hyperplastic polyp  ? COLONOSCOPY WITH PROPOFOL N/A 07/20/2019  ? Procedure: COLONOSCOPY WITH PROPOFOL;  Surgeon: [name], MD;  [facility];  Service: Endoscopy;  Laterality: N/A;  8:30am  ? EYE SURGERY Bilateral 2019  ? Cat Sx - Dr. [name]  ? LEFT HEART CATHETERIZATION WITH CORONARY ANGIOGRAM N/A 07/23/2011  ? Procedure: LEFT HEART CATHETERIZATION WITH CORONARY ANGIOGRAM;  Surgeon: [name], MD;  Location: [facility] CATH LAB;  Service: Cardiovascular;  Laterality: N/A;  ? POLYPECTOMY  07/20/2019  ? Procedure: POLYPECTOMY;  Surgeon: [name], MD;  Location[facility];  Service: Endoscopy;;  ? YAG LASER APPLICATION Right 78/46/9629  ? Dr. [name]  ? YAG LASER APPLICATION Left 52/84/1324  ? Dr. [name]  ? ?FAMILY HISTORY ?Family History  ?Problem Relation Age of Onset  ? Cancer Father   ? Diabetes Father   ? Colon cancer Neg Hx   ? ?SOCIAL HISTORY ?Social History  ? ?Tobacco Use  ? Smoking status: Never  ? Smokeless tobacco: Never  ?Vaping Use  ? Vaping Use: Never used  ?Substance Use Topics  ? Alcohol use: No  ? Drug use: No  ?   ? ?  ?OPHTHALMIC EXAM: ? ?Base Eye Exam   ? ? Visual Acuity (Snellen - Linear)   ? ?   Right Left  ? Dist cc 20/25 -2 20/25 -2  ? Dist ph cc NI NI  ? ? Correction: Glasses  ? ?  ?  ? ? Tonometry (Tonopen, 8:13 AM)   ? ?   Right Left  ? Pressure 16 14  ? ?  ?  ? ? Pupils   ? ?   Dark Light Shape React APD  ? Right 3 2 Round Brisk None  ? Left 3 2 Round Brisk None  ? ?  ?  ? ? Visual Fields (Counting fingers)   ? ?   Left Right  ?  Full Full  ? ?  ?  ? ?  Extraocular Movement   ? ?   Right Left  ?  Full, Ortho Full, Ortho  ? ?  ?  ? ? Neuro/Psych   ? ? Oriented x3: Yes  ? Mood/Affect: Normal  ? ?  ?  ? ? Dilation   ? ? Both eyes: 1.0% Mydriacyl, 2.5% Phenylephrine @ 8:13 AM  ? ?  ?  ? ?  ? ?Slit Lamp and Fundus Exam   ? ? Slit Lamp Exam   ? ?   Right Left  ? Lids/Lashes Dermatochalasis - upper lid, mild MGD Dermatochalasis - upper lid, mild MGD  ? Conjunctiva/Sclera white and quiet white and quiet  ? Cornea 1+PEE, well healed temporal cataract wound 1+PEE, well healed temporal cataract wound, decreased TBUT, mild tear film debris  ? Anterior Chamber deep and clear deep and clear  ? Iris round and dilated, No NVI round and dilated, No NVI  ? Lens PC IOL in good position, open PC PC IOL in good position with open PC  ? Anterior Vitreous syneresis, PVD syneresis, Posterior vitreous detachment, vitreous condensations  ? ?  ?  ? ? Fundus Exam   ? ?   Right Left  ? Disc pink and sharp, compact, mild PPA pink and sharp, compact, mild PPP  ? C/D Ratio 0.3 0.1  ? Macula flat, blunted foveal reflex, ERM with striae inferiorly, punctate IRH, +central cyst / edema - persistent flat, blunted foveal reflex, mild ERM, rare MA, no edema  ? Vessels attenuated, tortuous greatest IT arcades attenuated, tortuous  ? Periphery attached, scattered MA / DBH greatest superior to disc attached, rare MA/DBH, no RT/RD 360  ? ?  ?  ? ?  ? ?Refraction   ? ? Wearing Rx   ? ?   Sphere Cylinder Axis Add  ? Right [place]   +2.50  ? Left -0.25 +0.50 177  +2.50  ? ?  ?  ? ?  ? ?IMAGING AND PROCEDURES  ?Imaging and Procedures for 08/28/2021 ? ?OCT, Retina - OU - Both Eyes   ? ?   ?Right Eye ?Quality was good. Central Foveal Thickness: 492. Progression has been stable.

## 2021-08-28 ENCOUNTER — Other Ambulatory Visit: Payer: Self-pay

## 2021-08-28 ENCOUNTER — Ambulatory Visit (INDEPENDENT_AMBULATORY_CARE_PROVIDER_SITE_OTHER): Payer: Medicare Other | Admitting: Ophthalmology

## 2021-08-28 ENCOUNTER — Encounter (INDEPENDENT_AMBULATORY_CARE_PROVIDER_SITE_OTHER): Payer: Self-pay | Admitting: Ophthalmology

## 2021-08-28 DIAGNOSIS — H35373 Puckering of macula, bilateral: Secondary | ICD-10-CM

## 2021-08-28 DIAGNOSIS — I10 Essential (primary) hypertension: Secondary | ICD-10-CM | POA: Diagnosis not present

## 2021-08-28 DIAGNOSIS — E113313 Type 2 diabetes mellitus with moderate nonproliferative diabetic retinopathy with macular edema, bilateral: Secondary | ICD-10-CM

## 2021-08-28 DIAGNOSIS — Z961 Presence of intraocular lens: Secondary | ICD-10-CM

## 2021-08-28 DIAGNOSIS — H35033 Hypertensive retinopathy, bilateral: Secondary | ICD-10-CM | POA: Diagnosis not present

## 2021-08-28 MED ORDER — BEVACIZUMAB CHEMO INJECTION 1.25MG/0.05ML SYRINGE FOR KALEIDOSCOPE
1.2500 mg | INTRAVITREAL | Status: AC | PRN
  Administered 2021-08-28: 1.25 mg via INTRAVITREAL

## 2021-09-22 NOTE — Progress Notes (Signed)
?[facility] Note ? ?09/25/2021 ? ?  ? ?CHIEF COMPLAINT ?Patient presents for Retina Follow Up ? ?HISTORY OF PRESENT ILLNESS: ?Megan Schroeder is a 77 y.o. female who presents to the clinic today for:  ? ?HPI   ? ? Retina Follow Up   ?Patient presents with  Other (ERM OU ?HTN RET OU ?NPDR OU IVA #2 08/28/21).  In both eyes.  This started weeks ago.  Duration of 4 weeks.  I, the attending physician,  performed the HPI with the patient and updated documentation appropriately. ? ?  ?  ? ? Comments   ?Patient feels the vision has remained stable since her last visit 4 weeks ago. She only wears her glasses while reading. She needs a rx refill for the Prolensa. Her A1C is 7.3 and is due next month for a new check. Her blood sugar was 128 this morning.  ? ?  ?  ?Last edited by [name], MD on 09/25/2021  8:54 AM.  ?  ?pt states vision has not changed, only wears her glasses when she reads, she is using PF and Prolensa QID OD ? ?Referring physician: ?[name], FNP ?[address] ?[place],  [place] [address] ? ?HISTORICAL INFORMATION:  ? ?Selected notes from the [facility] ?Referred by Dr. [name]  ? ?CURRENT MEDICATIONS: ?Current Outpatient Medications (Ophthalmic Drugs)  ?Medication Sig  ? Bromfenac Sodium (PROLENSA) 0.07 % SOLN Place 1 drop into the right eye 4 (four) times daily.  ? prednisoLONE acetate (PRED FORTE) 1 % ophthalmic suspension Place 1 drop into the right eye 4 (four) times daily.  ? ?No current facility-administered medications for this visit. (Ophthalmic Drugs)  ? ?Current Outpatient Medications (Other)  ?Medication Sig  ? acetaminophen (TYLENOL) 325 MG tablet Take 2 tablets (650 mg total) by mouth every 6 (six) hours as needed for mild pain (or Fever >/= 101).  ? amLODipine (NORVASC) 10 MG tablet Take 1 tablet (10 mg total) by mouth daily. For BP  ? Ascorbic Acid (VITAMIN C) 1000 MG tablet Take 1,000 mg by mouth daily.  ? aspirin EC 81 MG tablet Take 1 tablet  (81 mg total) by mouth daily with breakfast.  ? atorvastatin (LIPITOR) 20 MG tablet Take 1 tablet (20 mg total) by mouth every evening.  ? cholecalciferol (VITAMIN D) 1000 UNITS tablet Take 1,000 Units by mouth daily.   ? Coenzyme Q10 (COQ-10) 200 MG CAPS Take 200 mg by mouth daily.   ? dapagliflozin propanediol (FARXIGA) 10 MG TABS tablet Take 1 tablet (10 mg total) by mouth daily.  ? GNP ULTICARE PEN NEEDLES 31G X 5 MM MISC See admin instructions.  ? levothyroxine (SYNTHROID) 75 MCG tablet Take 1 tablet (75 mcg total) by mouth daily before breakfast.  ? lisinopril (ZESTRIL) 40 MG tablet Take 40 mg by mouth daily.  ? metFORMIN (GLUCOPHAGE) 1000 MG tablet Take 1 tablet (1,000 mg total) by mouth 2 (two) times daily with a meal.  ? Omega-3 Fatty Acids (FISH OIL) 1200 MG CAPS Take 1,200 mg by mouth every other day.   ? pantoprazole (PROTONIX) 40 MG tablet Take 1 tablet (40 mg total) by mouth daily.  ? TOUJEO SOLOSTAR 300 UNIT/ML SOPN Inject 50 Units into the skin at bedtime.   ? TRULICITY 6.73 AL/9.3XT SOPN Inject 0.75 mg into the skin once a week.  ? ?No current facility-administered medications for this visit. (Other)  ? ?REVIEW OF SYSTEMS: ?ROS   ?Positive  for: Gastrointestinal, Endocrine, Cardiovascular, Eyes ?Negative for: Constitutional, Neurological, Skin, Genitourinary, Musculoskeletal, HENT, Respiratory, Psychiatric, Allergic/Imm, Heme/Lymph ?Last edited by Annie Paras, COT on 09/25/2021  8:04 AM.  ?  ? ?ALLERGIES ?Allergies  ?Allergen Reactions  ? Penicillins Hives  ?  Has patient had a PCN reaction causing immediate rash, facial/tongue/throat swelling, SOB or lightheadedness with hypotension: No ?Has patient had a PCN reaction causing severe rash involving mucus membranes or skin necrosis: Yes ?Has patient had a PCN reaction that required hospitalization: No ?Has patient had a PCN reaction occurring within the last 10 years: No ?If all of the above answers are "NO", then may proceed with  Cephalosporin use. ? ?  ? Hydrocodone Nausea And Vomiting  ? Sulfa Antibiotics   ? ?PAST MEDICAL HISTORY ?Past Medical History:  ?Diagnosis Date  ? Anxiety   ? Diabetic retinopathy ([place])   ? NPDR OU  ? Hyperlipidemia   ? Hypertension   ? Hypertensive retinopathy   ? OU  ? Hypothyroidism   ? Type 2 diabetes mellitus ([place])   ? ?Past Surgical History:  ?Procedure Laterality Date  ? BREAST EXCISIONAL BIOPSY Left   ? CATARACT EXTRACTION W/PHACO Left 12/16/2017  ? Procedure: CATARACT EXTRACTION PHACO AND INTRAOCULAR LENS PLACEMENT (IOC);  Surgeon: [name], MD;  Location: [facility];  Service: Ophthalmology;  Laterality: Left;  CDE: 11.65  ? CATARACT EXTRACTION W/PHACO Right 12/30/2017  ? Procedure: CATARACT EXTRACTION PHACO AND INTRAOCULAR LENS PLACEMENT RIGHT EYE;  Surgeon: [name], MD;  Location: [facility];  Service: Ophthalmology;  Laterality: Right;  CDE: 9.07  ? COLONOSCOPY N/A 09/15/2015  ? Dr. Dudley Major multiple rectal and colonic polyps removed. Hepatic flexure with 9 mm polyp, multiple 5-7 mm polyps. Multiple cecal polyps with largest 1.5 cm, one 5 mm polyp in rectum. Path for colonic polyps with sessile serrated polyp without dysplasia, no high grade dysplasia, tubular adenomas, rectal hyperplastic polyp  ? COLONOSCOPY WITH PROPOFOL N/A 07/20/2019  ? Procedure: COLONOSCOPY WITH PROPOFOL;  Surgeon: [name], MD;  Location: [facility];  Service: Endoscopy;  Laterality: N/A;  8:30am  ? EYE SURGERY Bilateral 2019  ? Cat Sx - Dr. [name]  ? LEFT HEART CATHETERIZATION WITH CORONARY ANGIOGRAM N/A 07/23/2011  ? Procedure: LEFT HEART CATHETERIZATION WITH CORONARY ANGIOGRAM;  Surgeon: [name], MD;  Location: [facility] CATH LAB;  Service: Cardiovascular;  Laterality: N/A;  ? POLYPECTOMY  07/20/2019  ? Procedure: POLYPECTOMY;  Surgeon: [name], MD;  Location: [facility];  Service: Endoscopy;;  ? YAG LASER APPLICATION Right 40/98/1191  ? Dr. [name]  ? YAG LASER APPLICATION Left 47/82/9562  ? Dr. [name]   ? ?FAMILY HISTORY ?Family History  ?Problem Relation Age of Onset  ? Cancer Father   ? Diabetes Father   ? Colon cancer Neg Hx   ? ?SOCIAL HISTORY ?Social History  ? ?Tobacco Use  ? Smoking status: Never  ? Smokeless tobacco: Never  ?Vaping Use  ? Vaping Use: Never used  ?Substance Use Topics  ? Alcohol use: No  ? Drug use: No  ?  ? ?  ?OPHTHALMIC EXAM: ? ?Base Eye Exam   ? ? Visual Acuity (Snellen - Linear)   ? ?   Right Left  ? Dist cc 20/25 +2 20/20 -1  ? ? Correction: Glasses  ? ?  ?  ? ? Tonometry (Tonopen, 8:09 AM)   ? ?   Right Left  ? Pressure 18 15  ? ?  ?  ? ?  Pupils   ? ?   Pupils Dark Light Shape React APD  ? Right PERRL 3 2 Round Brisk None  ? Left PERRL 3 2 Round Brisk None  ? ?  ?  ? ? Visual Fields   ? ?   Left Right  ?  Full Full  ? ?  ?  ? ? Extraocular Movement   ? ?   Right Left  ?  Full, Ortho Full, Ortho  ? ?  ?  ? ? Neuro/Psych   ? ? Oriented x3: Yes  ? Mood/Affect: Normal  ? ?  ?  ? ? Dilation   ? ? Both eyes: 1.0% Mydriacyl, 2.5% Phenylephrine @ 8:06 AM  ? ?  ?  ? ?  ? ?Slit Lamp and Fundus Exam   ? ? Slit Lamp Exam   ? ?   Right Left  ? Lids/Lashes Dermatochalasis - upper lid, mild MGD Dermatochalasis - upper lid, mild MGD  ? Conjunctiva/Sclera white and quiet white and quiet  ? Cornea 1+PEE, well healed temporal cataract wound 1+PEE, well healed temporal cataract wound, decreased TBUT, mild tear film debris  ? Anterior Chamber deep and clear deep and clear  ? Iris round and dilated, No NVI round and dilated, No NVI  ? Lens PC IOL in good position, open PC PC IOL in good position with open PC  ? Anterior Vitreous syneresis, PVD syneresis, Posterior vitreous detachment, vitreous condensations  ? ?  ?  ? ? Fundus Exam   ? ?   Right Left  ? Disc pink and sharp, compact, mild PPA pink and sharp, compact, mild PPP  ? C/D Ratio 0.3 0.1  ? Macula flat, blunted foveal reflex, ERM with striae inferiorly, punctate IRH, +central cyst / edema - persistent -- slightly improved flat, blunted foveal  reflex, mild ERM, rare MA, no edema, focal pigment clumping temporal to fovea  ? Vessels attenuated, tortuous greatest IT arcades attenuated, tortuous  ? Periphery attached, scattered MA / DBH greatest superior to d

## 2021-09-25 ENCOUNTER — Ambulatory Visit (INDEPENDENT_AMBULATORY_CARE_PROVIDER_SITE_OTHER): Payer: Medicare Other | Admitting: Ophthalmology

## 2021-09-25 ENCOUNTER — Encounter (INDEPENDENT_AMBULATORY_CARE_PROVIDER_SITE_OTHER): Payer: Self-pay | Admitting: Ophthalmology

## 2021-09-25 DIAGNOSIS — Z961 Presence of intraocular lens: Secondary | ICD-10-CM | POA: Diagnosis not present

## 2021-09-25 DIAGNOSIS — E113313 Type 2 diabetes mellitus with moderate nonproliferative diabetic retinopathy with macular edema, bilateral: Secondary | ICD-10-CM

## 2021-09-25 DIAGNOSIS — H35033 Hypertensive retinopathy, bilateral: Secondary | ICD-10-CM | POA: Diagnosis not present

## 2021-09-25 DIAGNOSIS — H35373 Puckering of macula, bilateral: Secondary | ICD-10-CM

## 2021-09-25 DIAGNOSIS — I10 Essential (primary) hypertension: Secondary | ICD-10-CM

## 2021-09-25 MED ORDER — BEVACIZUMAB CHEMO INJECTION 1.25MG/0.05ML SYRINGE FOR KALEIDOSCOPE
1.2500 mg | INTRAVITREAL | Status: AC | PRN
  Administered 2021-09-25: 1.25 mg via INTRAVITREAL

## 2021-10-03 DIAGNOSIS — L84 Corns and callosities: Secondary | ICD-10-CM | POA: Diagnosis not present

## 2021-10-03 DIAGNOSIS — B351 Tinea unguium: Secondary | ICD-10-CM | POA: Diagnosis not present

## 2021-10-03 DIAGNOSIS — E1142 Type 2 diabetes mellitus with diabetic polyneuropathy: Secondary | ICD-10-CM | POA: Diagnosis not present

## 2021-10-03 DIAGNOSIS — M79676 Pain in unspecified toe(s): Secondary | ICD-10-CM | POA: Diagnosis not present

## 2021-10-24 NOTE — Progress Notes (Signed)
[facility] Note  10/30/2021     CHIEF COMPLAINT Patient presents for Retina Follow Up  HISTORY OF PRESENT ILLNESS: Megan Schroeder is a 76 y.o. female who presents to the clinic today for:   HPI     Retina Follow Up   Patient presents with  Diabetic Retinopathy.  In both eyes.  Duration of 5 weeks.  Since onset it is stable.  I, the attending physician,  performed the HPI with the patient and updated documentation appropriately.        Comments   5 week follow up NPDR OU-  Doing well, no changes since last visit.  BS 140 this morning.  Will have A1C Wednesday.       Last edited by [name], MD on 10/30/2021 12:25 PM.     pt states no change in vision  Referring physician: [name], FNP [place],  [place] [address]  HISTORICAL INFORMATION:   Selected notes from the MEDICAL RECORD NUMBER Referred by Dr. [name]   CURRENT MEDICATIONS: Current Outpatient Medications (Ophthalmic Drugs)  Medication Sig   Bromfenac Sodium (PROLENSA) 0.07 % SOLN Place 1 drop into the right eye 4 (four) times daily.   prednisoLONE acetate (PRED FORTE) 1 % ophthalmic suspension Place 1 drop into the right eye 4 (four) times daily.   No current facility-administered medications for this visit. (Ophthalmic Drugs)   Current Outpatient Medications (Other)  Medication Sig   acetaminophen (TYLENOL) 325 MG tablet Take 2 tablets (650 mg total) by mouth every 6 (six) hours as needed for mild pain (or Fever >/= 101).   amLODipine (NORVASC) 10 MG tablet Take 1 tablet (10 mg total) by mouth daily. For BP   Ascorbic Acid (VITAMIN C) 1000 MG tablet Take 1,000 mg by mouth daily.   aspirin EC 81 MG tablet Take 1 tablet (81 mg total) by mouth daily with breakfast.   atorvastatin (LIPITOR) 20 MG tablet Take 1 tablet (20 mg total) by mouth every evening.   cholecalciferol (VITAMIN D) 1000 UNITS tablet Take 1,000 Units by mouth daily.    Coenzyme Q10  (COQ-10) 200 MG CAPS Take 200 mg by mouth daily.    dapagliflozin propanediol (FARXIGA) 10 MG TABS tablet Take 1 tablet (10 mg total) by mouth daily.   GNP ULTICARE PEN NEEDLES 31G X 5 MM MISC See admin instructions.   levothyroxine (SYNTHROID) 75 MCG tablet Take 1 tablet (75 mcg total) by mouth daily before breakfast.   lisinopril (ZESTRIL) 40 MG tablet Take 40 mg by mouth daily.   metFORMIN (GLUCOPHAGE) 1000 MG tablet Take 1 tablet (1,000 mg total) by mouth 2 (two) times daily with a meal.   Omega-3 Fatty Acids (FISH OIL) 1200 MG CAPS Take 1,200 mg by mouth every other day.    pantoprazole (PROTONIX) 40 MG tablet Take 1 tablet (40 mg total) by mouth daily.   TOUJEO SOLOSTAR 300 UNIT/ML SOPN Inject 50 Units into the skin at bedtime.    TRULICITY 3.22 GU/5.4YH SOPN Inject 0.75 mg into the skin once a week.   No current facility-administered medications for this visit. (Other)   REVIEW OF SYSTEMS: ROS   Positive for: Gastrointestinal, Endocrine, Cardiovascular, Eyes Negative for: Constitutional, Neurological, Skin, Genitourinary, Musculoskeletal, HENT, Respiratory, Psychiatric, Allergic/Imm, Heme/Lymph Last edited by Leonie Douglas, COA on 10/30/2021  8:40 AM.      ALLERGIES Allergies  Allergen Reactions   Penicillins Hives    Has patient had  a PCN reaction causing immediate rash, facial/tongue/throat swelling, SOB or lightheadedness with hypotension: No Has patient had a PCN reaction causing severe rash involving mucus membranes or skin necrosis: Yes Has patient had a PCN reaction that required hospitalization: No Has patient had a PCN reaction occurring within the last 10 years: No If all of the above answers are "NO", then may proceed with Cephalosporin use.     Hydrocodone Nausea And Vomiting   Sulfa Antibiotics    PAST MEDICAL HISTORY Past Medical History:  Diagnosis Date   Anxiety    Diabetic retinopathy ([place])    NPDR OU   Hyperlipidemia    Hypertension    Hypertensive  retinopathy    OU   Hypothyroidism    Type 2 diabetes mellitus ([place])    Past Surgical History:  Procedure Laterality Date   BREAST EXCISIONAL BIOPSY Left    CATARACT EXTRACTION W/PHACO Left 12/16/2017   Procedure: CATARACT EXTRACTION PHACO AND INTRAOCULAR LENS PLACEMENT ([place]);  Surgeon: [name], MD;  Location: [facility];  Service: Ophthalmology;  Laterality: Left;  CDE: 11.65   CATARACT EXTRACTION W/PHACO Right 12/30/2017   Procedure: CATARACT EXTRACTION PHACO AND INTRAOCULAR LENS PLACEMENT RIGHT EYE;  Surgeon: [name], MD;  Location: [facility];  Service: Ophthalmology;  Laterality: Right;  CDE: 9.07   COLONOSCOPY N/A 09/15/2015   Dr. Dudley Major multiple rectal and colonic polyps removed. Hepatic flexure with 9 mm polyp, multiple 5-7 mm polyps. Multiple cecal polyps with largest 1.5 cm, one 5 mm polyp in rectum. Path for colonic polyps with sessile serrated polyp without dysplasia, no high grade dysplasia, tubular adenomas, rectal hyperplastic polyp   COLONOSCOPY WITH PROPOFOL N/A 07/20/2019   Procedure: COLONOSCOPY WITH PROPOFOL;  Surgeon: [name], MD;  [facility];  Service: Endoscopy;  Laterality: N/A;  8:30am   EYE SURGERY Bilateral 2019   Cat Sx - Dr. [name]   LEFT HEART CATHETERIZATION WITH CORONARY ANGIOGRAM N/A 07/23/2011   Procedure: LEFT HEART CATHETERIZATION WITH CORONARY ANGIOGRAM;  Surgeon: [name], MD;  Location[facility] CATH LAB;  Service: Cardiovascular;  Laterality: N/A;   POLYPECTOMY  07/20/2019   Procedure: POLYPECTOMY;  Surgeon: [name], MD;  [facility];  Service: Endoscopy;;   YAG LASER APPLICATION Right 23/53/6144   Dr. [name]   YAG LASER APPLICATION Left 31/54/0086   Dr. [name]   FAMILY HISTORY Family History  Problem Relation Age of Onset   Cancer Father    Diabetes Father    Colon cancer Neg Hx    SOCIAL HISTORY Social History   Tobacco Use   Smoking status: Never   Smokeless tobacco: Never  Vaping Use    Vaping Use: Never used  Substance Use Topics   Alcohol use: No   Drug use: No       OPHTHALMIC EXAM:  Base Eye Exam     Visual Acuity (Snellen - Linear)       Right Left   Dist cc 20/30 20/20   Dist ph cc NI          Tonometry (Tonopen, 8:47 AM)       Right Left   Pressure 19 17         Pupils       Dark Light Shape React APD   Right 3 2 Round Brisk None   Left 3 2 Round Brisk None         Visual Fields (Counting fingers)  Left Right    Full Full         Extraocular Movement       Right Left    Full Full         Neuro/Psych     Oriented x3: Yes   Mood/Affect: Normal         Dilation     Both eyes: 1.0% Mydriacyl, 2.5% Phenylephrine @ 8:48 AM           Slit Lamp and Fundus Exam     Slit Lamp Exam       Right Left   Lids/Lashes Dermatochalasis - upper lid, mild MGD Dermatochalasis - upper lid, mild MGD   Conjunctiva/Sclera white and quiet white and quiet   Cornea 1+PEE, well healed temporal cataract wound 1+PEE, well healed temporal cataract wound, decreased TBUT, mild tear film debris   Anterior Chamber deep and clear deep and clear   Iris round and dilated, No NVI round and dilated, No NVI   Lens PC IOL in good position, open PC PC IOL in good position with open PC   Anterior Vitreous syneresis, PVD syneresis, Posterior vitreous detachment, vitreous condensations         Fundus Exam       Right Left   Disc pink and sharp, compact, mild PPA pink and sharp, compact, mild PPP   C/D Ratio 0.3 0.1   Macula flat, blunted foveal reflex, ERM with striae inferiorly, punctate IRH, +central cyst / edema -- slightly improved flat, blunted foveal reflex, mild ERM, rare MA, no edema, focal pigment clumping temporal to fovea   Vessels attenuated, tortuous greatest IT arcades attenuated, tortuous   Periphery attached, scattered MA / DBH greatest superior to disc - improving attached, rare MA/DBH, no RT/RD 360            Refraction     Wearing Rx       Sphere Cylinder Axis Add   Right [place]   +2.50   Left -0.25 +0.50 177 +2.50         Manifest Refraction       Sphere Cylinder Dist VA   Right +0.50 Sphere NI   Left              IMAGING AND PROCEDURES  Imaging and Procedures for 10/30/2021  OCT, Retina - OU - Both Eyes       Right Eye Quality was good. Central Foveal Thickness: 475. Progression has improved. Findings include abnormal foveal contour, no SRF, intraretinal fluid, epiretinal membrane, macular pucker (ERM with pucker and prominent central cyst; mild interval improvement in IRF and central cyst).   Left Eye Quality was good. Central Foveal Thickness: 264. Progression has been stable. Findings include normal foveal contour, no SRF, epiretinal membrane, no IRF, macular pucker.   Notes *Images captured and stored on drive  Diagnosis / Impression:  OD: ERM with pucker and prominent central cyst; mild interval improvement in IRF and central cyst OS: mild ERM with early pucker, partial PVD   Clinical management:  See below  Abbreviations: NFP - Normal foveal profile. CME - cystoid macular edema. PED - pigment epithelial detachment. IRF - intraretinal fluid. SRF - subretinal fluid. EZ - ellipsoid zone. ERM - epiretinal membrane. ORA - outer retinal atrophy. ORT - outer retinal tubulation. SRHM - subretinal hyper-reflective material. IRHM - intraretinal hyper-reflective material      Intravitreal Injection, Pharmacologic Agent - OD - Right Eye       Time Out  10/30/2021. 8:57 AM. Confirmed correct patient, procedure, site, and patient consented.   Anesthesia Topical anesthesia was used. Anesthetic medications included Lidocaine 2%, Proparacaine 0.5%.   Procedure Preparation included 5% betadine to ocular surface, eyelid speculum. A (32g) needle was used.   Injection: 1.25 mg Bevacizumab 1.'25mg'$ /0.41m   Route: Intravitreal, Site: Right Eye   NDC: [id number] Lot::  [id number] Expiration date: 12/24/2021   Post-op Post injection exam found visual acuity of at least counting fingers. The patient tolerated the procedure well. There were no complications. The patient received written and verbal post procedure care education. Post injection medications were not given.              ASSESSMENT/PLAN:    ICD-10-CM   1. Epiretinal membrane (ERM) of both eyes  H35.373 OCT, Retina - OU - Both Eyes    2. Moderate nonproliferative diabetic retinopathy of both eyes with macular edema associated with type 2 diabetes mellitus (HCC)  EV37.1062Intravitreal Injection, Pharmacologic Agent - OD - Right Eye    Bevacizumab (AVASTIN) SOLN 1.25 mg    3. Essential hypertension  I10     4. Hypertensive retinopathy of both eyes  H35.033     5. Pseudophakia of both eyes  Z96.1       1. Epiretinal membrane OU -- stable  - BCVA OD 20/30; OS 20/20  - OCT shows OD: ERM with pucker and prominent central cyst; mild interval improvement in IRF and central cyst  - IRF OD: ?DME vs CME vs cystic changes  - started PF and Prolensa QID OD only for possible CME component on 12.8.22 -- continue, but suspect DME may be significant component  - no metamorphopsia  - monitor for now  - f/u June 22 -- DFE/OCT  2. Moderate nonproliferative diabetic retinopathy OU  - s/p IVA OD #1 (02.16.23), #2 (03.20.23), #3 (04.17.23) - BCVA OD: 20/25 from 20/50, OS: 20/20 -- decreased OD - exam shows scattered MA, no NV - OCT without diabetic macular edema OS; OD: mild interval improvement in IRF and central cyst -- DME component at 5 wks - recommend IVA OD #4 today, 05.22.23 for DME component w/ ext to 6 wks - pt wishes to proceed - RBA of procedure discussed, questions answered - IVA informed consent obtained and signed, 02.16.23 (OD) - see procedure note - f/u in 6 wks -- pt wishes to f/u on June 22 to coordinate w/ sister's appt -- DFE/OCT  3,4. Hypertensive retinopathy OU - discussed  importance of tight BP control - monitor   5. Pseudophakia OU  - s/p CE/IOL OU, 2019 (Dr. HGeoffry Paradise  - IOLs in good position, doing well  - s/p YAG cap OD 3.24.22  - s/p YAG cap OS 04.07.22  - monitor  Ophthalmic Meds Ordered this visit:  Meds ordered this encounter  Medications   Bevacizumab (AVASTIN) SOLN 1.25 mg     Return for f/u June 22 NPDR OU, DFE, OCT.  There are no Patient Instructions on file for this visit.  This document serves as a record of services personally performed by BGardiner Sleeper MD, PhD. It was created on their behalf by DRoselee Nova COMT. The creation of this record is the provider's dictation and/or activities during the visit.  Electronically signed by: DRoselee Nova COMT 10/30/21 12:30 PM  This document serves as a record of services personally performed by BGardiner Sleeper MD, PhD. It was created on their behalf by ASan Jetty BOwens Shark OA an ophthalmic technician. The  creation of this record is the provider's dictation and/or activities during the visit.    Electronically signed by: San Jetty. Owens Shark, [place] 05.22.2023 12:30 PM  Gardiner Sleeper, M.D., Ph.D. Diseases & Surgery of the Retina and Vitreous [facility]  I have reviewed the above documentation for accuracy and completeness, and I agree with the above. Gardiner Sleeper, M.D., Ph.D. 10/30/21 12:30 PM   Abbreviations: M myopia (nearsighted); A astigmatism; H hyperopia (farsighted); P presbyopia; Mrx spectacle prescription;  CTL contact lenses; OD right eye; OS left eye; OU both eyes  XT exotropia; ET esotropia; PEK punctate epithelial keratitis; PEE punctate epithelial erosions; DES dry eye syndrome; MGD meibomian gland dysfunction; ATs artificial tears; PFAT's preservative free artificial tears; [place] nuclear sclerotic cataract; PSC posterior subcapsular cataract; ERM epi-retinal membrane; PVD posterior vitreous detachment; RD retinal detachment; DM diabetes mellitus; DR diabetic  retinopathy; NPDR non-proliferative diabetic retinopathy; PDR proliferative diabetic retinopathy; CSME clinically significant macular edema; DME diabetic macular edema; dbh dot blot hemorrhages; CWS cotton wool spot; POAG primary open angle glaucoma; C/D cup-to-disc ratio; HVF humphrey visual field; GVF goldmann visual field; OCT optical coherence tomography; IOP intraocular pressure; BRVO Branch retinal vein occlusion; CRVO central retinal vein occlusion; CRAO central retinal artery occlusion; BRAO branch retinal artery occlusion; RT retinal tear; SB scleral buckle; PPV pars plana vitrectomy; VH Vitreous hemorrhage; PRP panretinal laser photocoagulation; IVK intravitreal kenalog; VMT vitreomacular traction; MH Macular hole;  NVD neovascularization of the disc; NVE neovascularization elsewhere; AREDS age related eye disease study; ARMD age related macular degeneration; POAG primary open angle glaucoma; EBMD epithelial/anterior basement membrane dystrophy; ACIOL anterior chamber intraocular lens; IOL intraocular lens; PCIOL posterior chamber intraocular lens; Phaco/IOL phacoemulsification with intraocular lens placement; [place] photorefractive keratectomy; LASIK laser assisted in situ keratomileusis; HTN hypertension; DM diabetes mellitus; COPD chronic obstructive pulmonary disease

## 2021-10-25 DIAGNOSIS — E119 Type 2 diabetes mellitus without complications: Secondary | ICD-10-CM | POA: Diagnosis not present

## 2021-10-25 DIAGNOSIS — I10 Essential (primary) hypertension: Secondary | ICD-10-CM | POA: Diagnosis not present

## 2021-10-25 DIAGNOSIS — E039 Hypothyroidism, unspecified: Secondary | ICD-10-CM | POA: Diagnosis not present

## 2021-10-30 ENCOUNTER — Encounter (INDEPENDENT_AMBULATORY_CARE_PROVIDER_SITE_OTHER): Payer: Self-pay | Admitting: Ophthalmology

## 2021-10-30 ENCOUNTER — Ambulatory Visit (INDEPENDENT_AMBULATORY_CARE_PROVIDER_SITE_OTHER): Payer: Medicare Other | Admitting: Ophthalmology

## 2021-10-30 DIAGNOSIS — H35033 Hypertensive retinopathy, bilateral: Secondary | ICD-10-CM | POA: Diagnosis not present

## 2021-10-30 DIAGNOSIS — Z961 Presence of intraocular lens: Secondary | ICD-10-CM | POA: Diagnosis not present

## 2021-10-30 DIAGNOSIS — H35373 Puckering of macula, bilateral: Secondary | ICD-10-CM

## 2021-10-30 DIAGNOSIS — E113313 Type 2 diabetes mellitus with moderate nonproliferative diabetic retinopathy with macular edema, bilateral: Secondary | ICD-10-CM

## 2021-10-30 DIAGNOSIS — I10 Essential (primary) hypertension: Secondary | ICD-10-CM

## 2021-10-30 MED ORDER — BEVACIZUMAB CHEMO INJECTION 1.25MG/0.05ML SYRINGE FOR KALEIDOSCOPE
1.2500 mg | INTRAVITREAL | Status: AC | PRN
  Administered 2021-10-30: 1.25 mg via INTRAVITREAL

## 2021-11-01 ENCOUNTER — Other Ambulatory Visit (HOSPITAL_COMMUNITY): Payer: Self-pay | Admitting: Family Medicine

## 2021-11-01 DIAGNOSIS — Z13820 Encounter for screening for osteoporosis: Secondary | ICD-10-CM

## 2021-11-01 DIAGNOSIS — E039 Hypothyroidism, unspecified: Secondary | ICD-10-CM | POA: Diagnosis not present

## 2021-11-01 DIAGNOSIS — E785 Hyperlipidemia, unspecified: Secondary | ICD-10-CM | POA: Diagnosis not present

## 2021-11-01 DIAGNOSIS — I10 Essential (primary) hypertension: Secondary | ICD-10-CM | POA: Diagnosis not present

## 2021-11-01 DIAGNOSIS — E119 Type 2 diabetes mellitus without complications: Secondary | ICD-10-CM | POA: Diagnosis not present

## 2021-11-17 NOTE — Progress Notes (Shared)
[facility] Note  11/30/2021     CHIEF COMPLAINT Patient presents for Retina Follow Up  HISTORY OF PRESENT ILLNESS: Megan Schroeder is a 76 y.o. female who presents to the clinic today for:   HPI     Retina Follow Up           Diagnosis: Other   Laterality: both eyes   Severity: moderate   Duration: 5 weeks   Course: stable         Comments   Pt here for 5 wk f/u fro ERM OU. Pt states VA the same, no change.       Last edited by Kingsley Spittle, COT on 11/30/2021  7:58 AM.      pt states vision seems stable, she is still using the drops as directed, pt states BP and blood sugar have both been high recently  Referring physician: [name], FNP [place],  [place] [id number]  HISTORICAL INFORMATION:   Selected notes from the MEDICAL RECORD NUMBER Referred by Dr. [name]   CURRENT MEDICATIONS: Current Outpatient Medications (Ophthalmic Drugs)  Medication Sig   Bromfenac Sodium (PROLENSA) 0.07 % SOLN Place 1 drop into the right eye 4 (four) times daily.   prednisoLONE acetate (PRED FORTE) 1 % ophthalmic suspension Place 1 drop into the right eye 4 (four) times daily.   No current facility-administered medications for this visit. (Ophthalmic Drugs)   Current Outpatient Medications (Other)  Medication Sig   acetaminophen (TYLENOL) 325 MG tablet Take 2 tablets (650 mg total) by mouth every 6 (six) hours as needed for mild pain (or Fever >/= 101).   amLODipine (NORVASC) 10 MG tablet Take 1 tablet (10 mg total) by mouth daily. For BP   Ascorbic Acid (VITAMIN C) 1000 MG tablet Take 1,000 mg by mouth daily.   aspirin EC 81 MG tablet Take 1 tablet (81 mg total) by mouth daily with breakfast.   atorvastatin (LIPITOR) 20 MG tablet Take 1 tablet (20 mg total) by mouth every evening.   cholecalciferol (VITAMIN D) 1000 UNITS tablet Take 1,000 Units by mouth daily.    Coenzyme Q10 (COQ-10) 200 MG CAPS Take 200 mg by mouth  daily.    dapagliflozin propanediol (FARXIGA) 10 MG TABS tablet Take 1 tablet (10 mg total) by mouth daily.   GNP ULTICARE PEN NEEDLES 31G X 5 MM MISC See admin instructions.   levothyroxine (SYNTHROID) 75 MCG tablet Take 1 tablet (75 mcg total) by mouth daily before breakfast.   lisinopril (ZESTRIL) 40 MG tablet Take 40 mg by mouth daily.   metFORMIN (GLUCOPHAGE) 1000 MG tablet Take 1 tablet (1,000 mg total) by mouth 2 (two) times daily with a meal.   Omega-3 Fatty Acids (FISH OIL) 1200 MG CAPS Take 1,200 mg by mouth every other day.    pantoprazole (PROTONIX) 40 MG tablet Take 1 tablet (40 mg total) by mouth daily.   TOUJEO SOLOSTAR 300 UNIT/ML SOPN Inject 50 Units into the skin at bedtime.    TRULICITY 0.96 GE/3.6OQ SOPN Inject 0.75 mg into the skin once a week.   No current facility-administered medications for this visit. (Other)   REVIEW OF SYSTEMS: ROS   Positive for: Gastrointestinal, Endocrine, Cardiovascular, Eyes Negative for: Constitutional, Neurological, Skin, Genitourinary, Musculoskeletal, HENT, Respiratory, Psychiatric, Allergic/Imm, Heme/Lymph Last edited by Kingsley Spittle, COT on 11/30/2021  7:58 AM.       ALLERGIES Allergies  Allergen Reactions   Penicillins  Hives    Has patient had a PCN reaction causing immediate rash, facial/tongue/throat swelling, SOB or lightheadedness with hypotension: No Has patient had a PCN reaction causing severe rash involving mucus membranes or skin necrosis: Yes Has patient had a PCN reaction that required hospitalization: No Has patient had a PCN reaction occurring within the last 10 years: No If all of the above answers are "NO", then may proceed with Cephalosporin use.     Hydrocodone Nausea And Vomiting   Sulfa Antibiotics    PAST MEDICAL HISTORY Past Medical History:  Diagnosis Date   Anxiety    Diabetic retinopathy ([place])    NPDR OU   Hyperlipidemia    Hypertension    Hypertensive retinopathy    OU    Hypothyroidism    Type 2 diabetes mellitus ([place])    Past Surgical History:  Procedure Laterality Date   BREAST EXCISIONAL BIOPSY Left    CATARACT EXTRACTION W/PHACO Left 12/16/2017   Procedure: CATARACT EXTRACTION PHACO AND INTRAOCULAR LENS PLACEMENT ([place]);  Surgeon: [name], MD;  Location: [facility];  Service: Ophthalmology;  Laterality: Left;  CDE: 11.65   CATARACT EXTRACTION W/PHACO Right 12/30/2017   Procedure: CATARACT EXTRACTION PHACO AND INTRAOCULAR LENS PLACEMENT RIGHT EYE;  Surgeon: [name], MD;  Location: [facility];  Service: Ophthalmology;  Laterality: Right;  CDE: 9.07   COLONOSCOPY N/A 09/15/2015   Dr. Dudley Major multiple rectal and colonic polyps removed. Hepatic flexure with 9 mm polyp, multiple 5-7 mm polyps. Multiple cecal polyps with largest 1.5 cm, one 5 mm polyp in rectum. Path for colonic polyps with sessile serrated polyp without dysplasia, no high grade dysplasia, tubular adenomas, rectal hyperplastic polyp   COLONOSCOPY WITH PROPOFOL N/A 07/20/2019   Procedure: COLONOSCOPY WITH PROPOFOL;  Surgeon: [name], MD;  Location: [facility];  Service: Endoscopy;  Laterality: N/A;  8:30am   EYE SURGERY Bilateral 2019   Cat Sx - Dr. [name]   LEFT HEART CATHETERIZATION WITH CORONARY ANGIOGRAM N/A 07/23/2011   Procedure: LEFT HEART CATHETERIZATION WITH CORONARY ANGIOGRAM;  Surgeon: [name], MD;  Location: [facility] CATH LAB;  Service: Cardiovascular;  Laterality: N/A;   POLYPECTOMY  07/20/2019   Procedure: POLYPECTOMY;  Surgeon: [name], MD;  Location: [facility];  Service: Endoscopy;;   YAG LASER APPLICATION Right 56/21/3086   Dr. [name]   YAG LASER APPLICATION Left 57/84/6962   Dr. [name]   FAMILY HISTORY Family History  Problem Relation Age of Onset   Cancer Father    Diabetes Father    Colon cancer Neg Hx    SOCIAL HISTORY Social History   Tobacco Use   Smoking status: Never   Smokeless tobacco: Never  Vaping Use   Vaping Use: Never used   Substance Use Topics   Alcohol use: No   Drug use: No       OPHTHALMIC EXAM:  Base Eye Exam     Visual Acuity (Snellen - Linear)       Right Left   Dist cc 20/30 20/20   Dist ph cc NI     Correction: Glasses         Tonometry (Tonopen, 8:03 AM)       Right Left   Pressure 18 16         Pupils       Dark Light Shape React APD   Right 4 3 Round Brisk None   Left 3 2 Round Brisk None  Visual Fields (Counting fingers)       Left Right    Full Full         Extraocular Movement       Right Left    Full, Ortho Full, Ortho         Neuro/Psych     Oriented x3: Yes   Mood/Affect: Normal         Dilation     Both eyes: 1.0% Mydriacyl, 2.5% Phenylephrine @ 8:04 AM           Refraction     Wearing Rx       Sphere Cylinder Axis Add   Right [place]   +2.50   Left -0.25 +0.50 177 +2.50           IMAGING AND PROCEDURES  Imaging and Procedures for 11/30/2021            ASSESSMENT/PLAN:    ICD-10-CM   1. Epiretinal membrane (ERM) of both eyes  H35.373 OCT, Retina - OU - Both Eyes    2. Moderate nonproliferative diabetic retinopathy of both eyes with macular edema associated with type 2 diabetes mellitus (HCC)  E11.3313 OCT, Retina - OU - Both Eyes    3. Essential hypertension  I10     4. Hypertensive retinopathy of both eyes  H35.033     5. Pseudophakia of both eyes  Z96.1       1. Epiretinal membrane OU -- stable  - BCVA OD 20/30; OS 20/20  - OCT shows ERM with pucker and prominent central cyst; mild interval increase in IRF and central cyst  - IRF OD: ?DME vs CME vs cystic changes  - started PF and Prolensa QID OD only for possible CME component on 12.8.22 -- continue, but suspect DME may be significant component  - no metamorphopsia  - monitor for now  - f/u 5 weels -- DFE/OCT  2. Moderate nonproliferative diabetic retinopathy OU  - s/p IVA OD #1 (02.16.23), #2 (03.20.23), #3 (04.17.23), #4 (05.22.23) - BCVA  OD: stable at 20/30, OS: 20/20  - exam shows scattered MA, no NV - OCT without diabetic macular edema OS; OD: ERM with pucker and prominent central cyst; mild interval increase in IRF and central cyst - recommend IVA OD #5 today, 06.22.23 for DME component w/ f/u at 5 wks - pt wishes to proceed - RBA of procedure discussed, questions answered - IVA informed consent obtained and signed, 02.16.23 (OD) - see procedure note - f/u in 5 wks -- pt wishes to f/u on June 22 to coordinate w/ sister's appt -- DFE/OCT  3,4. Hypertensive retinopathy OU - discussed importance of tight BP control - monitor   5. Pseudophakia OU  - s/p CE/IOL OU, 2019 (Dr. Geoffry Paradise)  - IOLs in good position, doing well  - s/p YAG cap OD 3.24.22  - s/p YAG cap OS 04.07.22  - monitor  Ophthalmic Meds Ordered this visit:  No orders of the defined types were placed in this encounter.    No follow-ups on file.  There are no Patient Instructions on file for this visit.  This document serves as a record of services personally performed by Gardiner Sleeper, MD, PhD. It was created on their behalf by San Jetty. Owens Shark, OA an ophthalmic technician. The creation of this record is the provider's dictation and/or activities during the visit.    Electronically signed by: San Jetty. Owens Shark, [place] 06.09.2023 8:25 AM   Gardiner Sleeper, M.D., Ph.D.  Diseases & Surgery of the Retina and Vitreous [facility]    Abbreviations: M myopia (nearsighted); A astigmatism; H hyperopia (farsighted); P presbyopia; Mrx spectacle prescription;  CTL contact lenses; OD right eye; OS left eye; OU both eyes  XT exotropia; ET esotropia; PEK punctate epithelial keratitis; PEE punctate epithelial erosions; DES dry eye syndrome; MGD meibomian gland dysfunction; ATs artificial tears; PFAT's preservative free artificial tears; [place] nuclear sclerotic cataract; PSC posterior subcapsular cataract; ERM epi-retinal membrane; PVD posterior vitreous  detachment; RD retinal detachment; DM diabetes mellitus; DR diabetic retinopathy; NPDR non-proliferative diabetic retinopathy; PDR proliferative diabetic retinopathy; CSME clinically significant macular edema; DME diabetic macular edema; dbh dot blot hemorrhages; CWS cotton wool spot; POAG primary open angle glaucoma; C/D cup-to-disc ratio; HVF humphrey visual field; GVF goldmann visual field; OCT optical coherence tomography; IOP intraocular pressure; BRVO Branch retinal vein occlusion; CRVO central retinal vein occlusion; CRAO central retinal artery occlusion; BRAO branch retinal artery occlusion; RT retinal tear; SB scleral buckle; PPV pars plana vitrectomy; VH Vitreous hemorrhage; PRP panretinal laser photocoagulation; IVK intravitreal kenalog; VMT vitreomacular traction; MH Macular hole;  NVD neovascularization of the disc; NVE neovascularization elsewhere; AREDS age related eye disease study; ARMD age related macular degeneration; POAG primary open angle glaucoma; EBMD epithelial/anterior basement membrane dystrophy; ACIOL anterior chamber intraocular lens; IOL intraocular lens; PCIOL posterior chamber intraocular lens; Phaco/IOL phacoemulsification with intraocular lens placement; [place] photorefractive keratectomy; LASIK laser assisted in situ keratomileusis; HTN hypertension; DM diabetes mellitus; COPD chronic obstructive pulmonary disease

## 2021-11-30 ENCOUNTER — Ambulatory Visit (INDEPENDENT_AMBULATORY_CARE_PROVIDER_SITE_OTHER): Payer: Medicare Other | Admitting: Ophthalmology

## 2021-11-30 ENCOUNTER — Encounter (INDEPENDENT_AMBULATORY_CARE_PROVIDER_SITE_OTHER): Payer: Self-pay | Admitting: Ophthalmology

## 2021-11-30 DIAGNOSIS — H35033 Hypertensive retinopathy, bilateral: Secondary | ICD-10-CM | POA: Diagnosis not present

## 2021-11-30 DIAGNOSIS — I10 Essential (primary) hypertension: Secondary | ICD-10-CM

## 2021-11-30 DIAGNOSIS — Z961 Presence of intraocular lens: Secondary | ICD-10-CM

## 2021-11-30 DIAGNOSIS — E113313 Type 2 diabetes mellitus with moderate nonproliferative diabetic retinopathy with macular edema, bilateral: Secondary | ICD-10-CM | POA: Diagnosis not present

## 2021-11-30 DIAGNOSIS — H35373 Puckering of macula, bilateral: Secondary | ICD-10-CM | POA: Diagnosis not present

## 2021-12-01 ENCOUNTER — Encounter (INDEPENDENT_AMBULATORY_CARE_PROVIDER_SITE_OTHER): Payer: Self-pay | Admitting: Ophthalmology

## 2021-12-01 MED ORDER — BEVACIZUMAB CHEMO INJECTION 1.25MG/0.05ML SYRINGE FOR KALEIDOSCOPE
1.2500 mg | INTRAVITREAL | Status: AC | PRN
  Administered 2021-12-01: 1.25 mg via INTRAVITREAL

## 2021-12-08 ENCOUNTER — Other Ambulatory Visit (INDEPENDENT_AMBULATORY_CARE_PROVIDER_SITE_OTHER): Payer: Self-pay | Admitting: Ophthalmology

## 2021-12-19 DIAGNOSIS — B351 Tinea unguium: Secondary | ICD-10-CM | POA: Diagnosis not present

## 2021-12-19 DIAGNOSIS — L84 Corns and callosities: Secondary | ICD-10-CM | POA: Diagnosis not present

## 2021-12-19 DIAGNOSIS — E1142 Type 2 diabetes mellitus with diabetic polyneuropathy: Secondary | ICD-10-CM | POA: Diagnosis not present

## 2021-12-19 DIAGNOSIS — M79676 Pain in unspecified toe(s): Secondary | ICD-10-CM | POA: Diagnosis not present

## 2021-12-22 NOTE — Progress Notes (Signed)
[facility] Note  01/04/2022     CHIEF COMPLAINT Patient presents for Retina Follow Up  HISTORY OF PRESENT ILLNESS: Megan Schroeder is a 76 y.o. female who presents to the clinic today for:   HPI     Retina Follow Up   Patient presents with  Other (IVA OD #5 06.22.23).  In both eyes.  Severity is moderate.  Duration of 5 weeks.  Since onset it is stable.  I, the attending physician,  performed the HPI with the patient and updated documentation appropriately.        Comments   Patient feels that the vision is the same. She admits to seeing floaters.       Last edited by [name], MD on 01/04/2022  1:55 PM.    Pt states VA seems the same. Pt now on trulicity.   Referring physician: [name], FNP [place],   [id number]  HISTORICAL INFORMATION:   Selected notes from the MEDICAL RECORD NUMBER Referred by Dr. [name]   CURRENT MEDICATIONS: Current Outpatient Medications (Ophthalmic Drugs)  Medication Sig   prednisoLONE acetate (PRED FORTE) 1 % ophthalmic suspension Place 1 drop into the right eye 4 (four) times daily.   PROLENSA 0.07 % SOLN PLACE 1 DROP IN THE RIGHT EYE FOUR TIMES DAILY   No current facility-administered medications for this visit. (Ophthalmic Drugs)   Current Outpatient Medications (Other)  Medication Sig   acetaminophen (TYLENOL) 325 MG tablet Take 2 tablets (650 mg total) by mouth every 6 (six) hours as needed for mild pain (or Fever >/= 101).   amLODipine (NORVASC) 10 MG tablet Take 1 tablet (10 mg total) by mouth daily. For BP   Ascorbic Acid (VITAMIN C) 1000 MG tablet Take 1,000 mg by mouth daily.   aspirin EC 81 MG tablet Take 1 tablet (81 mg total) by mouth daily with breakfast.   atorvastatin (LIPITOR) 20 MG tablet Take 1 tablet (20 mg total) by mouth every evening.   cholecalciferol (VITAMIN D) 1000 UNITS tablet Take 1,000 Units by mouth daily.    Coenzyme Q10 (COQ-10) 200 MG CAPS Take  200 mg by mouth daily.    dapagliflozin propanediol (FARXIGA) 10 MG TABS tablet Take 1 tablet (10 mg total) by mouth daily.   GNP ULTICARE PEN NEEDLES 31G X 5 MM MISC See admin instructions.   levothyroxine (SYNTHROID) 75 MCG tablet Take 1 tablet (75 mcg total) by mouth daily before breakfast.   lisinopril (ZESTRIL) 40 MG tablet Take 40 mg by mouth daily.   metFORMIN (GLUCOPHAGE) 1000 MG tablet Take 1 tablet (1,000 mg total) by mouth 2 (two) times daily with a meal.   Omega-3 Fatty Acids (FISH OIL) 1200 MG CAPS Take 1,200 mg by mouth every other day.    pantoprazole (PROTONIX) 40 MG tablet Take 1 tablet (40 mg total) by mouth daily.   TOUJEO SOLOSTAR 300 UNIT/ML SOPN Inject 50 Units into the skin at bedtime.    TRULICITY 7.65 YY/5.0PT SOPN Inject 0.75 mg into the skin once a week.   No current facility-administered medications for this visit. (Other)   REVIEW OF SYSTEMS: ROS   Positive for: Gastrointestinal, Endocrine, Cardiovascular, Eyes Negative for: Constitutional, Neurological, Skin, Genitourinary, Musculoskeletal, HENT, Respiratory, Psychiatric, Allergic/Imm, Heme/Lymph Last edited by Annie Paras, COT on 01/04/2022 12:17 PM.     ALLERGIES Allergies  Allergen Reactions   Penicillins Hives    Has patient had a PCN reaction  causing immediate rash, facial/tongue/throat swelling, SOB or lightheadedness with hypotension: No Has patient had a PCN reaction causing severe rash involving mucus membranes or skin necrosis: Yes Has patient had a PCN reaction that required hospitalization: No Has patient had a PCN reaction occurring within the last 10 years: No If all of the above answers are "NO", then may proceed with Cephalosporin use.     Hydrocodone Nausea And Vomiting   Sulfa Antibiotics    PAST MEDICAL HISTORY Past Medical History:  Diagnosis Date   Anxiety    Diabetic retinopathy ([place])    NPDR OU   Hyperlipidemia    Hypertension    Hypertensive retinopathy    OU    Hypothyroidism    Type 2 diabetes mellitus ([place])    Past Surgical History:  Procedure Laterality Date   BREAST EXCISIONAL BIOPSY Left    CATARACT EXTRACTION W/PHACO Left 12/16/2017   Procedure: CATARACT EXTRACTION PHACO AND INTRAOCULAR LENS PLACEMENT ([place]);  Surgeon: [name], MD;  Location: [facility];  Service: Ophthalmology;  Laterality: Left;  CDE: 11.65   CATARACT EXTRACTION W/PHACO Right 12/30/2017   Procedure: CATARACT EXTRACTION PHACO AND INTRAOCULAR LENS PLACEMENT RIGHT EYE;  Surgeon: [name], MD;  Location: [facility];  Service: Ophthalmology;  Laterality: Right;  CDE: 9.07   COLONOSCOPY N/A 09/15/2015   Dr. Dudley Major multiple rectal and colonic polyps removed. Hepatic flexure with 9 mm polyp, multiple 5-7 mm polyps. Multiple cecal polyps with largest 1.5 cm, one 5 mm polyp in rectum. Path for colonic polyps with sessile serrated polyp without dysplasia, no high grade dysplasia, tubular adenomas, rectal hyperplastic polyp   COLONOSCOPY WITH PROPOFOL N/A 07/20/2019   Procedure: COLONOSCOPY WITH PROPOFOL;  Surgeon: [name], MD;  Location: [facility];  Service: Endoscopy;  Laterality: N/A;  8:30am   EYE SURGERY Bilateral 2019   Cat Sx - Dr. [name]   LEFT HEART CATHETERIZATION WITH CORONARY ANGIOGRAM N/A 07/23/2011   Procedure: LEFT HEART CATHETERIZATION WITH CORONARY ANGIOGRAM;  Surgeon: [name], MD;  Location: [facility] CATH LAB;  Service: Cardiovascular;  Laterality: N/A;   POLYPECTOMY  07/20/2019   Procedure: POLYPECTOMY;  Surgeon: [name], MD;  Location: [facility];  Service: Endoscopy;;   YAG LASER APPLICATION Right 23/55/7322   Dr. [name]   YAG LASER APPLICATION Left 02/54/2706   Dr. [name]   FAMILY HISTORY Family History  Problem Relation Age of Onset   Cancer Father    Diabetes Father    Colon cancer Neg Hx    SOCIAL HISTORY Social History   Tobacco Use   Smoking status: Never   Smokeless tobacco: Never  Vaping Use   Vaping Use: Never used   Substance Use Topics   Alcohol use: No   Drug use: No       OPHTHALMIC EXAM:  Base Eye Exam     Visual Acuity (Snellen - Linear)       Right Left   Dist cc 20/30 20/20   Dist ph cc NI     Correction: Glasses         Tonometry (Tonopen, 12:21 PM)       Right Left   Pressure 16 18         Pupils       Dark Light Shape React APD   Right 4 3 Round Brisk None   Left 4 3 Round Brisk None         Visual Fields  Left Right    Full Full         Extraocular Movement       Right Left    Full, Ortho Full, Ortho         Neuro/Psych     Oriented x3: Yes   Mood/Affect: Normal         Dilation     Both eyes: 2.5% Phenylephrine, 1.0% Mydriacyl @ 12:18 PM           Slit Lamp and Fundus Exam     Slit Lamp Exam       Right Left   Lids/Lashes Dermatochalasis - upper lid, mild MGD Dermatochalasis - upper lid, mild MGD   Conjunctiva/Sclera white and quiet white and quiet   Cornea 1+PEE, well healed temporal cataract wound 1+PEE, well healed temporal cataract wound, decreased TBUT, mild tear film debris   Anterior Chamber deep and clear deep and clear   Iris round and dilated, No NVI round and dilated, No NVI   Lens PC IOL in good position, open PC PC IOL in good position with open PC   Anterior Vitreous syneresis, PVD syneresis, Posterior vitreous detachment, vitreous condensations         Fundus Exam       Right Left   Disc pink and sharp, compact, mild PPA pink and sharp, compact, mild PPP   C/D Ratio 0.3 0.1   Macula flat, blunted foveal reflex, ERM with striae inferiorly, punctate IRH, +central cyst / edema -- slightly improved, focal exudate superior macula flat, blunted foveal reflex, mild ERM, rare MA, no edema, focal pigment clumping temporal to fovea   Vessels attenuated, tortuous greatest IT arcades attenuated, tortuous   Periphery attached, scattered MA / DBH greatest superior to disc - improving attached, rare MA/DBH, no RT/RD  360           Refraction     Wearing Rx       Sphere Cylinder Axis Add   Right [place]   +2.50   Left -0.25 +0.50 177 +2.50           IMAGING AND PROCEDURES  Imaging and Procedures for 01/04/2022  OCT, Retina - OU - Both Eyes       Right Eye Quality was good. Central Foveal Thickness: 518. Progression has improved. Findings include no SRF, abnormal foveal contour, epiretinal membrane, intraretinal fluid, macular pucker (ERM with pucker and prominent central cyst; mild interval improvement in IRF ).   Left Eye Quality was good. Central Foveal Thickness: 266. Progression has been stable. Findings include normal foveal contour, no IRF, no SRF, epiretinal membrane, macular pucker.   Notes *Images captured and stored on drive  Diagnosis / Impression:  OD: ERM with pucker and prominent central cyst; mild interval improvement in IRF  OS: mild ERM with early pucker, partial PVD   Clinical management:  See below  Abbreviations: NFP - Normal foveal profile. CME - cystoid macular edema. PED - pigment epithelial detachment. IRF - intraretinal fluid. SRF - subretinal fluid. EZ - ellipsoid zone. ERM - epiretinal membrane. ORA - outer retinal atrophy. ORT - outer retinal tubulation. SRHM - subretinal hyper-reflective material. IRHM - intraretinal hyper-reflective material      Intravitreal Injection, Pharmacologic Agent - OD - Right Eye       Time Out 01/04/2022. 1:10 PM. Confirmed correct patient, procedure, site, and patient consented.   Anesthesia Topical anesthesia was used. Anesthetic medications included Lidocaine 2%, Proparacaine 0.5%.   Procedure Preparation included  5% betadine to ocular surface, eyelid speculum. A supplied (32g) needle was used.   Injection: 1.25 mg Bevacizumab 1.'25mg'$ /0.63m   Route: Intravitreal, Site: Right Eye   NDC: [id number] Lot:: [id number] Expiration date: 02/12/2022   Post-op Post injection exam found visual acuity of at least counting  fingers. The patient tolerated the procedure well. There were no complications. The patient received written and verbal post procedure care education. Post injection medications were not given.            ASSESSMENT/PLAN:    ICD-10-CM   1. Epiretinal membrane (ERM) of both eyes  H35.373 OCT, Retina - OU - Both Eyes    2. Moderate nonproliferative diabetic retinopathy of both eyes with macular edema associated with type 2 diabetes mellitus (HCC)  EH20.9470Intravitreal Injection, Pharmacologic Agent - OD - Right Eye    Bevacizumab (AVASTIN) SOLN 1.25 mg    3. Essential hypertension  I10     4. Hypertensive retinopathy of both eyes  H35.033     5. Pseudophakia of both eyes  Z96.1      1. Epiretinal membrane OU -- stable  - BCVA OD 20/30; OS 20/20  - OCT shows ERM with pucker and prominent central cyst; mild interval improvement in IRF  - IRF OD: ?DME vs CME vs cystic changes  - started PF and Prolensa QID OD only for possible CME component on 12.8.22 -- continue, but suspect DME may be significant component  - no metamorphopsia  - monitor for now  - f/u 5 weeks -- DFE/OCT  2. Moderate nonproliferative diabetic retinopathy OU  - s/p IVA OD #1 (02.16.23), #2 (03.20.23), #3 (04.17.23), #4 (05.22.23), #5 (06.22.23) - BCVA OD: stable at 20/30, OS: 20/20  - exam shows scattered MA, no NV - OCT without diabetic macular edema OS; OD: ERM with pucker and prominent central cyst; mild interval improvement in IRF at 5 wks - recommend IVA OD #6 today, 07.27.23 for DME component w/ f/u at 5 wks - pt wishes to proceed - RBA of procedure discussed, questions answered - IVA informed consent obtained and signed, 02.16.23 (OD) - see procedure note - f/u in 5 wks - DFE/OCT, possible injxn  3,4. Hypertensive retinopathy OU - discussed importance of tight BP control - monitor   5. Pseudophakia OU  - s/p CE/IOL OU, 2019 (Dr. HGeoffry [place]  - IOLs in good position, doing well  - s/p YAG cap OD  3.24.22  - s/p YAG cap OS 04.07.22  - monitor  Ophthalmic Meds Ordered this visit:  Meds ordered this encounter  Medications   Bevacizumab (AVASTIN) SOLN 1.25 mg     Return in about 5 weeks (around 02/08/2022) for DME OD, DFE, OCT, possible injection.  There are no Patient Instructions on file for this visit.  This document serves as a record of services personally performed by BGardiner Sleeper MD, PhD. It was created on their behalf by ASan Jetty BOwens Shark OA an ophthalmic technician. The creation of this record is the provider's dictation and/or activities during the visit.    Electronically signed by: ASan Jetty BOwens Shark O[place]07.14.2023 1:58 PM  This document serves as a record of services personally performed by BGardiner Sleeper MD, PhD. It was created on their behalf by MOrvan Falconer an ophthalmic technician. The creation of this record is the provider's dictation and/or activities during the visit.    Electronically signed by: MOrvan Falconer OA, 01/04/22  1:58 PM  BGardiner Sleeper M.D., Ph.D. Diseases &  Surgery of the Retina and Vitreous [facility]  I have reviewed the above documentation for accuracy and completeness, and I agree with the above. Gardiner Sleeper, M.D., Ph.D. 01/04/22 1:59 PM   Abbreviations: M myopia (nearsighted); A astigmatism; H hyperopia (farsighted); P presbyopia; Mrx spectacle prescription;  CTL contact lenses; OD right eye; OS left eye; OU both eyes  XT exotropia; ET esotropia; PEK punctate epithelial keratitis; PEE punctate epithelial erosions; DES dry eye syndrome; MGD meibomian gland dysfunction; ATs artificial tears; PFAT's preservative free artificial tears; [place] nuclear sclerotic cataract; PSC posterior subcapsular cataract; ERM epi-retinal membrane; PVD posterior vitreous detachment; RD retinal detachment; DM diabetes mellitus; DR diabetic retinopathy; NPDR non-proliferative diabetic retinopathy; PDR proliferative diabetic  retinopathy; CSME clinically significant macular edema; DME diabetic macular edema; dbh dot blot hemorrhages; CWS cotton wool spot; POAG primary open angle glaucoma; C/D cup-to-disc ratio; HVF humphrey visual field; GVF goldmann visual field; OCT optical coherence tomography; IOP intraocular pressure; BRVO Branch retinal vein occlusion; CRVO central retinal vein occlusion; CRAO central retinal artery occlusion; BRAO branch retinal artery occlusion; RT retinal tear; SB scleral buckle; PPV pars plana vitrectomy; VH Vitreous hemorrhage; PRP panretinal laser photocoagulation; IVK intravitreal kenalog; VMT vitreomacular traction; MH Macular hole;  NVD neovascularization of the disc; NVE neovascularization elsewhere; AREDS age related eye disease study; ARMD age related macular degeneration; POAG primary open angle glaucoma; EBMD epithelial/anterior basement membrane dystrophy; ACIOL anterior chamber intraocular lens; IOL intraocular lens; PCIOL posterior chamber intraocular lens; Phaco/IOL phacoemulsification with intraocular lens placement; [place] photorefractive keratectomy; LASIK laser assisted in situ keratomileusis; HTN hypertension; DM diabetes mellitus; COPD chronic obstructive pulmonary disease

## 2022-01-04 ENCOUNTER — Encounter (INDEPENDENT_AMBULATORY_CARE_PROVIDER_SITE_OTHER): Payer: Self-pay | Admitting: Ophthalmology

## 2022-01-04 ENCOUNTER — Encounter (INDEPENDENT_AMBULATORY_CARE_PROVIDER_SITE_OTHER): Payer: Medicare Other | Admitting: Ophthalmology

## 2022-01-04 ENCOUNTER — Ambulatory Visit (INDEPENDENT_AMBULATORY_CARE_PROVIDER_SITE_OTHER): Payer: Medicare Other | Admitting: Ophthalmology

## 2022-01-04 DIAGNOSIS — I10 Essential (primary) hypertension: Secondary | ICD-10-CM

## 2022-01-04 DIAGNOSIS — H35033 Hypertensive retinopathy, bilateral: Secondary | ICD-10-CM

## 2022-01-04 DIAGNOSIS — H35373 Puckering of macula, bilateral: Secondary | ICD-10-CM

## 2022-01-04 DIAGNOSIS — E113313 Type 2 diabetes mellitus with moderate nonproliferative diabetic retinopathy with macular edema, bilateral: Secondary | ICD-10-CM

## 2022-01-04 DIAGNOSIS — Z961 Presence of intraocular lens: Secondary | ICD-10-CM | POA: Diagnosis not present

## 2022-01-04 MED ORDER — BEVACIZUMAB CHEMO INJECTION 1.25MG/0.05ML SYRINGE FOR KALEIDOSCOPE
1.2500 mg | INTRAVITREAL | Status: AC | PRN
  Administered 2022-01-04: 1.25 mg via INTRAVITREAL

## 2022-02-05 NOTE — Progress Notes (Signed)
[facility] Note  02/09/2022     CHIEF COMPLAINT Patient presents for Retina Follow Up  HISTORY OF PRESENT ILLNESS: Megan Schroeder is a 76 y.o. female who presents to the clinic today for:   HPI     Retina Follow Up   Patient presents with  Other (IVA OD 07.27.23).  In both eyes.  Severity is moderate.  Duration of 5 weeks.  Since onset it is stable.  I, the attending physician,  performed the HPI with the patient and updated documentation appropriately.        Comments   Patient feels that the vision is the same as it was 5 weeks ago. She is wanting to know if we have any Prolensa. Her blood sugar was 98 and her A1C is 7.0.      Last edited by [name], MD on 02/09/2022 12:00 PM.    Pt states her vision is not as good as it was last time she was here, she is on a new medication for diabetes, but has not been feeling well  Referring physician: [name], FNP [place],  [place] [id number]  HISTORICAL INFORMATION:   Selected notes from the MEDICAL RECORD NUMBER Referred by Dr. [name]   CURRENT MEDICATIONS: Current Outpatient Medications (Ophthalmic Drugs)  Medication Sig   Bromfenac Sodium (PROLENSA) 0.07 % SOLN PLACE 1 DROP IN THE RIGHT EYE FOUR TIMES DAILY   prednisoLONE acetate (PRED FORTE) 1 % ophthalmic suspension Place 1 drop into the right eye 4 (four) times daily.   No current facility-administered medications for this visit. (Ophthalmic Drugs)   Current Outpatient Medications (Other)  Medication Sig   acetaminophen (TYLENOL) 325 MG tablet Take 2 tablets (650 mg total) by mouth every 6 (six) hours as needed for mild pain (or Fever >/= 101).   amLODipine (NORVASC) 10 MG tablet Take 1 tablet (10 mg total) by mouth daily. For BP   Ascorbic Acid (VITAMIN C) 1000 MG tablet Take 1,000 mg by mouth daily.   aspirin EC 81 MG tablet Take 1 tablet (81 mg total) by mouth daily with breakfast.   atorvastatin (LIPITOR) 20 MG  tablet Take 1 tablet (20 mg total) by mouth every evening.   cholecalciferol (VITAMIN D) 1000 UNITS tablet Take 1,000 Units by mouth daily.    Coenzyme Q10 (COQ-10) 200 MG CAPS Take 200 mg by mouth daily.    dapagliflozin propanediol (FARXIGA) 10 MG TABS tablet Take 1 tablet (10 mg total) by mouth daily.   GNP ULTICARE PEN NEEDLES 31G X 5 MM MISC See admin instructions.   levothyroxine (SYNTHROID) 75 MCG tablet Take 1 tablet (75 mcg total) by mouth daily before breakfast.   lisinopril (ZESTRIL) 40 MG tablet Take 40 mg by mouth daily.   metFORMIN (GLUCOPHAGE) 1000 MG tablet Take 1 tablet (1,000 mg total) by mouth 2 (two) times daily with a meal.   Omega-3 Fatty Acids (FISH OIL) 1200 MG CAPS Take 1,200 mg by mouth every other day.    pantoprazole (PROTONIX) 40 MG tablet Take 1 tablet (40 mg total) by mouth daily.   TOUJEO SOLOSTAR 300 UNIT/ML SOPN Inject 50 Units into the skin at bedtime.    TRULICITY 7.84 ON/6.2XB SOPN Inject 0.75 mg into the skin once a week.   No current facility-administered medications for this visit. (Other)   REVIEW OF SYSTEMS: ROS   Positive for: Gastrointestinal, Endocrine, Cardiovascular, Eyes Negative for: Constitutional, Neurological, Skin, Genitourinary,  Musculoskeletal, HENT, Respiratory, Psychiatric, Allergic/Imm, Heme/Lymph Last edited by Annie Paras, COT on 02/09/2022  8:16 AM.     ALLERGIES Allergies  Allergen Reactions   Penicillins Hives    Has patient had a PCN reaction causing immediate rash, facial/tongue/throat swelling, SOB or lightheadedness with hypotension: No Has patient had a PCN reaction causing severe rash involving mucus membranes or skin necrosis: Yes Has patient had a PCN reaction that required hospitalization: No Has patient had a PCN reaction occurring within the last 10 years: No If all of the above answers are "NO", then may proceed with Cephalosporin use.     Hydrocodone Nausea And Vomiting   Sulfa Antibiotics    PAST  MEDICAL HISTORY Past Medical History:  Diagnosis Date   Anxiety    Diabetic retinopathy ([name])    NPDR OU   Hyperlipidemia    Hypertension    Hypertensive retinopathy    OU   Hypothyroidism    Type 2 diabetes mellitus ([name])    Past Surgical History:  Procedure Laterality Date   BREAST EXCISIONAL BIOPSY Left    CATARACT EXTRACTION W/PHACO Left 12/16/2017   Procedure: CATARACT EXTRACTION PHACO AND INTRAOCULAR LENS PLACEMENT ([place]);  Surgeon: [name], MD;  Location: [facility];  Service: Ophthalmology;  Laterality: Left;  CDE: 11.65   CATARACT EXTRACTION W/PHACO Right 12/30/2017   Procedure: CATARACT EXTRACTION PHACO AND INTRAOCULAR LENS PLACEMENT RIGHT EYE;  Surgeon: [name], MD;  Location: [facility];  Service: Ophthalmology;  Laterality: Right;  CDE: 9.07   COLONOSCOPY N/A 09/15/2015   Dr. Dudley Major multiple rectal and colonic polyps removed. Hepatic flexure with 9 mm polyp, multiple 5-7 mm polyps. Multiple cecal polyps with largest 1.5 cm, one 5 mm polyp in rectum. Path for colonic polyps with sessile serrated polyp without dysplasia, no high grade dysplasia, tubular adenomas, rectal hyperplastic polyp   COLONOSCOPY WITH PROPOFOL N/A 07/20/2019   Procedure: COLONOSCOPY WITH PROPOFOL;  Surgeon: [name], MD;  Location: [facility];  Service: Endoscopy;  Laterality: N/A;  8:30am   EYE SURGERY Bilateral 2019   Cat Sx - Dr. [name]   LEFT HEART CATHETERIZATION WITH CORONARY ANGIOGRAM N/A 07/23/2011   Procedure: LEFT HEART CATHETERIZATION WITH CORONARY ANGIOGRAM;  Surgeon: [name], MD;  Location: [facility] CATH LAB;  Service: Cardiovascular;  Laterality: N/A;   POLYPECTOMY  07/20/2019   Procedure: POLYPECTOMY;  Surgeon: [name], MD;  Location: [facility];  Service: Endoscopy;;   YAG LASER APPLICATION Right 52/84/1324   Dr. [name]   YAG LASER APPLICATION Left 40/03/2724   Dr. [name]   FAMILY HISTORY Family History  Problem Relation Age of Onset   Cancer Father     Diabetes Father    Colon cancer Neg Hx    SOCIAL HISTORY Social History   Tobacco Use   Smoking status: Never   Smokeless tobacco: Never  Vaping Use   Vaping Use: Never used  Substance Use Topics   Alcohol use: No   Drug use: No       OPHTHALMIC EXAM:  Base Eye Exam     Visual Acuity (Snellen - Linear)       Right Left   Dist cc 20/30 20/20 +2   Dist ph cc NI     Correction: Glasses         Tonometry (Tonopen, 8:19 AM)       Right Left   Pressure 19 19  Pupils       Dark Light Shape React APD   Right 4 3 Round Brisk None   Left 4 3 Round Brisk None         Visual Fields       Left Right    Full Full         Extraocular Movement       Right Left    Full, Ortho Full, Ortho         Neuro/Psych     Oriented x3: Yes   Mood/Affect: Normal         Dilation     Both eyes: 1.0% Mydriacyl, 2.5% Phenylephrine @ 8:16 AM           Slit Lamp and Fundus Exam     Slit Lamp Exam       Right Left   Lids/Lashes Dermatochalasis - upper lid, mild MGD Dermatochalasis - upper lid, mild MGD   Conjunctiva/Sclera white and quiet white and quiet   Cornea 1+PEE, well healed temporal cataract wound 1+PEE, well healed temporal cataract wound, decreased TBUT, mild tear film debris   Anterior Chamber deep and clear deep and clear   [place]ris round and dilated, No NVI round and dilated, No NVI   Lens PC IOL in good position, open PC PC IOL in good position with open PC   Anterior Vitreous syneresis, PVD syneresis, Posterior vitreous detachment, vitreous condensations         Fundus Exam       Right Left   Disc pink and sharp, compact, mild PPA pink and sharp, compact, mild PPP   C/D Ratio 0.3 0.1   Macula flat, blunted foveal reflex, ERM with striae inferiorly, punctate IRH, +central cyst / edema -- persistent, focal exudate superior macula flat, blunted foveal reflex, mild ERM, rare MA, no edema, focal pigment clumping temporal to fovea    Vessels attenuated, tortuous greatest IT arcades attenuated, tortuous   Periphery attached, scattered MA / DBH greatest superior to disc - improving attached, rare MA/DBH, no RT/RD 360           Refraction     Wearing Rx       Sphere Cylinder Axis Add   Right [place]   +2.50   Left -0.25 +0.50 177 +2.50           IMAGING AND PROCEDURES  Imaging and Procedures for 02/09/2022  OCT, Retina - OU - Both Eyes       Right Eye Quality was good. Central Foveal Thickness: 535. Progression has improved. Findings include no SRF, abnormal foveal contour, epiretinal membrane, intraretinal fluid, macular pucker (ERM with pucker and prominent central cyst; persistent IRF -- slight increase).   Left Eye Quality was good. Central Foveal Thickness: 266. Progression has been stable. Findings include normal foveal contour, no IRF, no SRF, epiretinal membrane, macular pucker.   Notes *Images captured and stored on drive  Diagnosis / Impression:  OD: ERM with pucker and prominent central cyst; persistent IRF -- slight increase OS: mild ERM with early pucker, partial PVD   Clinical management:  See below  Abbreviations: NFP - Normal foveal profile. CME - cystoid macular edema. PED - pigment epithelial detachment. IRF - intraretinal fluid. SRF - subretinal fluid. EZ - ellipsoid zone. ERM - epiretinal membrane. ORA - outer retinal atrophy. ORT - outer retinal tubulation. SRHM - subretinal hyper-reflective material. IRHM - intraretinal hyper-reflective material      Intravitreal Injection, Pharmacologic Agent - OD -  Right Eye       Time Out 02/09/2022. 9:30 AM. Confirmed correct patient, procedure, site, and patient consented.   Anesthesia Topical anesthesia was used. Anesthetic medications included Lidocaine 2%, Proparacaine 0.5%.   Procedure Preparation included 5% betadine to ocular surface, eyelid speculum. A (32g) needle was used.   Injection: 1.25 mg Bevacizumab 1.'25mg'$ /0.75m    Route: Intravitreal, Site: Right Eye   NDC: [id number] Lot:: [id number] Expiration date: 03/20/2022   Post-op Post injection exam found visual acuity of at least counting fingers. The patient tolerated the procedure well. There were no complications. The patient received written and verbal post procedure care education. Post injection medications were not given.            ASSESSMENT/PLAN:    ICD-10-CM   1. Epiretinal membrane (ERM) of both eyes  H35.373 OCT, Retina - OU - Both Eyes    2. Moderate nonproliferative diabetic retinopathy of both eyes with macular edema associated with type 2 diabetes mellitus (HCC)  E11.3313 OCT, Retina - OU - Both Eyes    Intravitreal Injection, Pharmacologic Agent - OD - Right Eye    Bevacizumab (AVASTIN) SOLN 1.25 mg    3. Essential hypertension  I10     4. Hypertensive retinopathy of both eyes  H35.033     5. Pseudophakia of both eyes  Z96.1      1. Epiretinal membrane OU -- stable  - BCVA OD 20/30; OS 20/20  - OCT shows ERM with pucker and prominent central cyst; mild interval improvement in IRF  - IRF OD: ?DME vs CME vs cystic changes  - started PF and Prolensa QID OD only for possible CME component on 12.8.22 -- continue, but suspect DME may be significant component  - no metamorphopsia  - monitor for now  - f/u 5 weeks -- DFE/OCT  2. Moderate nonproliferative diabetic retinopathy OU  - s/p IVA OD #1 (02.16.23), #2 (03.20.23), #3 (04.17.23), #4 (05.22.23), #5 (06.22.23), #6 (07.27.23) - BCVA OD: stable at 20/30, OS: 20/20  - exam shows scattered MA, no NV - OCT shows OD: ERM with pucker and prominent central cyst; persistent IRF -- slight increase OS: mild ERM with early pucker, partial PVD  - recommend IVA OD #7 today, 09.01.23 for DME component w/ f/u at 5 wks - pt wishes to proceed - RBA of procedure discussed, questions answered - IVA informed consent obtained and signed, 02.16.23 (OD) - see procedure note - f/u in 5 wks -  DFE/OCT, possible injxn  3,4. Hypertensive retinopathy OU - discussed importance of tight BP control - monitor   5. Pseudophakia OU  - s/p CE/IOL OU, 2019 (Dr. HGeoffry Paradise  - IOLs in good position, doing well  - s/p YAG cap OD 3.24.22  - s/p YAG cap OS 04.07.22  - monitor  Ophthalmic Meds Ordered this visit:  Meds ordered this encounter  Medications   Bromfenac Sodium (PROLENSA) 0.07 % SOLN    Sig: PLACE 1 DROP IN THE RIGHT EYE FOUR TIMES DAILY    Dispense:  3 mL    Refill:  6   Bevacizumab (AVASTIN) SOLN 1.25 mg     Return in about 5 weeks (around 03/16/2022) for f/u NPDR OU, DFE, OCT.  There are no Patient Instructions on file for this visit.  This document serves as a record of services personally performed by BGardiner Sleeper MD, PhD. It was created on their behalf by ASan Jetty BOwens Shark OA an ophthalmic technician. The creation of this  record is the provider's dictation and/or activities during the visit.    Electronically signed by: San Jetty. Owens Shark, [place] 08.28.2023 12:18 PM  Gardiner Sleeper, M.D., Ph.D. Diseases & Surgery of the Retina and Vitreous [facility]  I have reviewed the above documentation for accuracy and completeness, and I agree with the above. Gardiner Sleeper, M.D., Ph.D. 02/09/22 12:18 PM  Abbreviations: M myopia (nearsighted); A astigmatism; H hyperopia (farsighted); P presbyopia; Mrx spectacle prescription;  CTL contact lenses; OD right eye; OS left eye; OU both eyes  XT exotropia; ET esotropia; PEK punctate epithelial keratitis; PEE punctate epithelial erosions; DES dry eye syndrome; MGD meibomian gland dysfunction; ATs artificial tears; PFAT's preservative free artificial tears; Page nuclear sclerotic cataract; PSC posterior subcapsular cataract; ERM epi-retinal membrane; PVD posterior vitreous detachment; RD retinal detachment; DM diabetes mellitus; DR diabetic retinopathy; NPDR non-proliferative diabetic retinopathy; PDR proliferative  diabetic retinopathy; CSME clinically significant macular edema; DME diabetic macular edema; dbh dot blot hemorrhages; CWS cotton wool spot; POAG primary open angle glaucoma; C/D cup-to-disc ratio; HVF humphrey visual field; GVF goldmann visual field; OCT optical coherence tomography; IOP intraocular pressure; BRVO Branch retinal vein occlusion; CRVO central retinal vein occlusion; CRAO central retinal artery occlusion; BRAO branch retinal artery occlusion; RT retinal tear; SB scleral buckle; PPV pars plana vitrectomy; VH Vitreous hemorrhage; PRP panretinal laser photocoagulation; IVK intravitreal kenalog; VMT vitreomacular traction; MH Macular hole;  NVD neovascularization of the disc; NVE neovascularization elsewhere; AREDS age related eye disease study; ARMD age related macular degeneration; POAG primary open angle glaucoma; EBMD epithelial/anterior basement membrane dystrophy; ACIOL anterior chamber intraocular lens; IOL intraocular lens; PCIOL posterior chamber intraocular lens; Phaco/IOL phacoemulsification with intraocular lens placement; Riva photorefractive keratectomy; LASIK laser assisted in situ keratomileusis; HTN hypertension; DM diabetes mellitus; COPD chronic obstructive pulmonary disease

## 2022-02-08 DIAGNOSIS — E039 Hypothyroidism, unspecified: Secondary | ICD-10-CM | POA: Diagnosis not present

## 2022-02-08 DIAGNOSIS — E785 Hyperlipidemia, unspecified: Secondary | ICD-10-CM | POA: Diagnosis not present

## 2022-02-08 DIAGNOSIS — E119 Type 2 diabetes mellitus without complications: Secondary | ICD-10-CM | POA: Diagnosis not present

## 2022-02-09 ENCOUNTER — Encounter (INDEPENDENT_AMBULATORY_CARE_PROVIDER_SITE_OTHER): Payer: Self-pay | Admitting: Ophthalmology

## 2022-02-09 ENCOUNTER — Ambulatory Visit (INDEPENDENT_AMBULATORY_CARE_PROVIDER_SITE_OTHER): Payer: Medicare Other | Admitting: Ophthalmology

## 2022-02-09 DIAGNOSIS — H35033 Hypertensive retinopathy, bilateral: Secondary | ICD-10-CM

## 2022-02-09 DIAGNOSIS — Z961 Presence of intraocular lens: Secondary | ICD-10-CM

## 2022-02-09 DIAGNOSIS — E113313 Type 2 diabetes mellitus with moderate nonproliferative diabetic retinopathy with macular edema, bilateral: Secondary | ICD-10-CM | POA: Diagnosis not present

## 2022-02-09 DIAGNOSIS — H35373 Puckering of macula, bilateral: Secondary | ICD-10-CM | POA: Diagnosis not present

## 2022-02-09 DIAGNOSIS — I10 Essential (primary) hypertension: Secondary | ICD-10-CM | POA: Diagnosis not present

## 2022-02-09 MED ORDER — BEVACIZUMAB CHEMO INJECTION 1.25MG/0.05ML SYRINGE FOR KALEIDOSCOPE
1.2500 mg | INTRAVITREAL | Status: AC | PRN
  Administered 2022-02-09: 1.25 mg via INTRAVITREAL

## 2022-02-09 MED ORDER — PROLENSA 0.07 % OP SOLN: OPHTHALMIC | 6 refills | Status: DC

## 2022-02-13 DIAGNOSIS — E039 Hypothyroidism, unspecified: Secondary | ICD-10-CM | POA: Diagnosis not present

## 2022-02-13 DIAGNOSIS — Z6824 Body mass index (BMI) 24.0-24.9, adult: Secondary | ICD-10-CM | POA: Diagnosis not present

## 2022-02-13 DIAGNOSIS — Z23 Encounter for immunization: Secondary | ICD-10-CM | POA: Diagnosis not present

## 2022-02-13 DIAGNOSIS — E785 Hyperlipidemia, unspecified: Secondary | ICD-10-CM | POA: Diagnosis not present

## 2022-02-13 DIAGNOSIS — E1165 Type 2 diabetes mellitus with hyperglycemia: Secondary | ICD-10-CM | POA: Diagnosis not present

## 2022-02-13 DIAGNOSIS — Z0001 Encounter for general adult medical examination with abnormal findings: Secondary | ICD-10-CM | POA: Diagnosis not present

## 2022-02-13 DIAGNOSIS — Z13820 Encounter for screening for osteoporosis: Secondary | ICD-10-CM | POA: Diagnosis not present

## 2022-02-13 DIAGNOSIS — I10 Essential (primary) hypertension: Secondary | ICD-10-CM | POA: Diagnosis not present

## 2022-02-27 ENCOUNTER — Ambulatory Visit (HOSPITAL_COMMUNITY)
Admission: RE | Admit: 2022-02-27 | Discharge: 2022-02-27 | Disposition: A | Discharge status: Home / Self Care | Payer: Medicare Other | Source: Ambulatory Visit | Attending: Family Medicine | Admitting: Family Medicine

## 2022-02-27 DIAGNOSIS — B351 Tinea unguium: Secondary | ICD-10-CM | POA: Diagnosis not present

## 2022-02-27 DIAGNOSIS — Z13820 Encounter for screening for osteoporosis: Secondary | ICD-10-CM | POA: Insufficient documentation

## 2022-02-27 DIAGNOSIS — E1142 Type 2 diabetes mellitus with diabetic polyneuropathy: Secondary | ICD-10-CM | POA: Diagnosis not present

## 2022-02-27 DIAGNOSIS — M810 Age-related osteoporosis without current pathological fracture: Secondary | ICD-10-CM | POA: Insufficient documentation

## 2022-02-27 DIAGNOSIS — Z780 Asymptomatic menopausal state: Secondary | ICD-10-CM | POA: Diagnosis not present

## 2022-02-27 DIAGNOSIS — Z794 Long term (current) use of insulin: Secondary | ICD-10-CM | POA: Diagnosis not present

## 2022-02-27 DIAGNOSIS — E119 Type 2 diabetes mellitus without complications: Secondary | ICD-10-CM | POA: Diagnosis not present

## 2022-02-27 DIAGNOSIS — M79676 Pain in unspecified toe(s): Secondary | ICD-10-CM | POA: Diagnosis not present

## 2022-02-27 DIAGNOSIS — L84 Corns and callosities: Secondary | ICD-10-CM | POA: Diagnosis not present

## 2022-02-27 DIAGNOSIS — M85832 Other specified disorders of bone density and structure, left forearm: Secondary | ICD-10-CM | POA: Diagnosis not present

## 2022-03-06 NOTE — Progress Notes (Signed)
[facility] Note  03/16/2022     CHIEF COMPLAINT Patient presents for Retina Follow Up  HISTORY OF PRESENT ILLNESS: Megan Schroeder is a 76 y.o. female who presents to the clinic today for:   HPI     Retina Follow Up   Patient presents with  Other.  In both eyes.  Severity is moderate.  Duration of 5 weeks.  Since onset it is stable.  I, the attending physician,  performed the HPI with the patient and updated documentation appropriately.        Comments   Pt here for 5 wk ret f/u ERM OU. Pt states VA the same, no changes. BS this am was 140, last A1C was 7.2.      Last edited by [name], MD on 03/16/2022 12:09 PM.    Pt states   Referring physician: [name], FNP [place],   [id number]  HISTORICAL INFORMATION:   Selected notes from the MEDICAL RECORD NUMBER Referred by Dr. [name]   CURRENT MEDICATIONS: Current Outpatient Medications (Ophthalmic Drugs)  Medication Sig   Bromfenac Sodium (PROLENSA) 0.07 % SOLN PLACE 1 DROP IN THE RIGHT EYE FOUR TIMES DAILY   prednisoLONE acetate (PRED FORTE) 1 % ophthalmic suspension Place 1 drop into the right eye 4 (four) times daily.   No current facility-administered medications for this visit. (Ophthalmic Drugs)   Current Outpatient Medications (Other)  Medication Sig   acetaminophen (TYLENOL) 325 MG tablet Take 2 tablets (650 mg total) by mouth every 6 (six) hours as needed for mild pain (or Fever >/= 101).   amLODipine (NORVASC) 10 MG tablet Take 1 tablet (10 mg total) by mouth daily. For BP   Ascorbic Acid (VITAMIN C) 1000 MG tablet Take 1,000 mg by mouth daily.   aspirin EC 81 MG tablet Take 1 tablet (81 mg total) by mouth daily with breakfast.   atorvastatin (LIPITOR) 20 MG tablet Take 1 tablet (20 mg total) by mouth every evening.   cholecalciferol (VITAMIN D) 1000 UNITS tablet Take 1,000 Units by mouth daily.    Coenzyme Q10 (COQ-10) 200 MG CAPS Take 200 mg by  mouth daily.    dapagliflozin propanediol (FARXIGA) 10 MG TABS tablet Take 1 tablet (10 mg total) by mouth daily.   GNP ULTICARE PEN NEEDLES 31G X 5 MM MISC See admin instructions.   levothyroxine (SYNTHROID) 75 MCG tablet Take 1 tablet (75 mcg total) by mouth daily before breakfast.   lisinopril (ZESTRIL) 40 MG tablet Take 40 mg by mouth daily.   metFORMIN (GLUCOPHAGE) 1000 MG tablet Take 1 tablet (1,000 mg total) by mouth 2 (two) times daily with a meal.   Omega-3 Fatty Acids (FISH OIL) 1200 MG CAPS Take 1,200 mg by mouth every other day.    pantoprazole (PROTONIX) 40 MG tablet Take 1 tablet (40 mg total) by mouth daily.   TOUJEO SOLOSTAR 300 UNIT/ML SOPN Inject 50 Units into the skin at bedtime.    TRULICITY 4.03 KV/4.2VZ SOPN Inject 0.75 mg into the skin once a week.   No current facility-administered medications for this visit. (Other)   REVIEW OF SYSTEMS: ROS   Positive for: Gastrointestinal, Endocrine, Cardiovascular, Eyes Negative for: Constitutional, Neurological, Skin, Genitourinary, Musculoskeletal, HENT, Respiratory, Psychiatric, Allergic/Imm, Heme/Lymph Last edited by Kingsley Spittle, COT on 03/16/2022  8:27 AM.     ALLERGIES Allergies  Allergen Reactions   Penicillins Hives    Has patient had a PCN  reaction causing immediate rash, facial/tongue/throat swelling, SOB or lightheadedness with hypotension: No Has patient had a PCN reaction causing severe rash involving mucus membranes or skin necrosis: Yes Has patient had a PCN reaction that required hospitalization: No Has patient had a PCN reaction occurring within the last 10 years: No If all of the above answers are "NO", then may proceed with Cephalosporin use.     Hydrocodone Nausea And Vomiting   Sulfa Antibiotics    PAST MEDICAL HISTORY Past Medical History:  Diagnosis Date   Anxiety    Diabetic retinopathy ([place])    NPDR OU   Hyperlipidemia    Hypertension    Hypertensive retinopathy    OU    Hypothyroidism    Type 2 diabetes mellitus ([place])    Past Surgical History:  Procedure Laterality Date   BREAST EXCISIONAL BIOPSY Left    CATARACT EXTRACTION W/PHACO Left 12/16/2017   Procedure: CATARACT EXTRACTION PHACO AND INTRAOCULAR LENS PLACEMENT ([place]);  Surgeon: [name], MD;  Location: [facility];  Service: Ophthalmology;  Laterality: Left;  CDE: 11.65   CATARACT EXTRACTION W/PHACO Right 12/30/2017   Procedure: CATARACT EXTRACTION PHACO AND INTRAOCULAR LENS PLACEMENT RIGHT EYE;  Surgeon: [name], MD;  Location: [facility];  Service: Ophthalmology;  Laterality: Right;  CDE: 9.07   COLONOSCOPY N/A 09/15/2015   Dr. Dudley Major multiple rectal and colonic polyps removed. Hepatic flexure with 9 mm polyp, multiple 5-7 mm polyps. Multiple cecal polyps with largest 1.5 cm, one 5 mm polyp in rectum. Path for colonic polyps with sessile serrated polyp without dysplasia, no high grade dysplasia, tubular adenomas, rectal hyperplastic polyp   COLONOSCOPY WITH PROPOFOL N/A 07/20/2019   Procedure: COLONOSCOPY WITH PROPOFOL;  Surgeon: [name], MD;  Location: [facility];  Service: Endoscopy;  Laterality: N/A;  8:30am   EYE SURGERY Bilateral 2019   Cat Sx - Dr. [name]   LEFT HEART CATHETERIZATION WITH CORONARY ANGIOGRAM N/A 07/23/2011   Procedure: LEFT HEART CATHETERIZATION WITH CORONARY ANGIOGRAM;  Surgeon: [name], MD;  Location: [facility] CATH LAB;  Service: Cardiovascular;  Laterality: N/A;   POLYPECTOMY  07/20/2019   Procedure: POLYPECTOMY;  Surgeon: [name], MD;  Location: [facility];  Service: Endoscopy;;   YAG LASER APPLICATION Right 93/26/7124   Dr. [name]   YAG LASER APPLICATION Left 58/02/9832   Dr. [name]   FAMILY HISTORY Family History  Problem Relation Age of Onset   Cancer Father    Diabetes Father    Colon cancer Neg Hx    SOCIAL HISTORY Social History   Tobacco Use   Smoking status: Never   Smokeless tobacco: Never  Vaping Use   Vaping Use: Never used   Substance Use Topics   Alcohol use: No   Drug use: No       OPHTHALMIC EXAM:  Base Eye Exam     Visual Acuity (Snellen - Linear)       Right Left   Dist cc 20/25 -2 20/20   Dist ph cc NI     Correction: Glasses         Tonometry (Tonopen, 8:32 AM)       Right Left   Pressure 18 15         Pupils       Dark Light Shape React APD   Right 3 2 Round Minimal None   Left 3 2 Round Minimal None         Visual Fields (Counting  fingers)       Left Right    Full Full         Extraocular Movement       Right Left    Full, Ortho Full, Ortho         Neuro/Psych     Oriented x3: Yes   Mood/Affect: Normal         Dilation     Both eyes: 1.0% Mydriacyl, 2.5% Phenylephrine @ 8:34 AM           Slit Lamp and Fundus Exam     Slit Lamp Exam       Right Left   Lids/Lashes Dermatochalasis - upper lid, mild MGD Dermatochalasis - upper lid, mild MGD   Conjunctiva/Sclera white and quiet white and quiet   Cornea 1+PEE, well healed temporal cataract wound 1+PEE, well healed temporal cataract wound, decreased TBUT, mild tear film debris   Anterior Chamber deep and clear deep and clear   Iris round and dilated, No NVI round and dilated, No NVI   Lens PC IOL in good position, open PC PC IOL in good position with open PC   Anterior Vitreous syneresis, PVD syneresis, Posterior vitreous detachment, vitreous condensations         Fundus Exam       Right Left   Disc pink and sharp, compact, mild PPA pink and sharp, compact, mild PPP   C/D Ratio 0.3 0.1   Macula flat, blunted foveal reflex, ERM with striae inferiorly, punctate IRH, +central cyst / edema -- slightly improved, focal exudate superior macula, linear heme superior macula flat, blunted foveal reflex, mild ERM, rare MA, no edema, focal pigment clumping temporal to fovea   Vessels attenuated, tortuous greatest IT arcades attenuated, tortuous   Periphery attached, scattered MA / DBH greatest superior  to disc - improving attached, rare MA/DBH, no RT/RD 360           Refraction     Wearing Rx       Sphere Cylinder Axis Add   Right [place]   +2.50   Left -0.25 +0.50 177 +2.50           IMAGING AND PROCEDURES  Imaging and Procedures for 03/16/2022  OCT, Retina - OU - Both Eyes       Right Eye Quality was good. Central Foveal Thickness: 516. Progression has improved. Findings include no SRF, abnormal foveal contour, epiretinal membrane, intraretinal fluid, macular pucker (ERM with pucker and prominent central cyst; IRF slightly improved).   Left Eye Quality was good. Central Foveal Thickness: 265. Progression has been stable. Findings include normal foveal contour, no IRF, no SRF, epiretinal membrane, macular pucker.   Notes *Images captured and stored on drive  Diagnosis / Impression:  OD: ERM with pucker and prominent central cyst; IRF slightly improved OS: mild ERM with early pucker, partial PVD   Clinical management:  See below  Abbreviations: NFP - Normal foveal profile. CME - cystoid macular edema. PED - pigment epithelial detachment. IRF - intraretinal fluid. SRF - subretinal fluid. EZ - ellipsoid zone. ERM - epiretinal membrane. ORA - outer retinal atrophy. ORT - outer retinal tubulation. SRHM - subretinal hyper-reflective material. IRHM - intraretinal hyper-reflective material      Intravitreal Injection, Pharmacologic Agent - OD - Right Eye       Time Out 03/16/2022. 9:07 AM. Confirmed correct patient, procedure, site, and patient consented.   Anesthesia Topical anesthesia was used. Anesthetic medications included Lidocaine 2%, Proparacaine 0.5%.  Procedure Preparation included 5% betadine to ocular surface, eyelid speculum. A (32g) needle was used.   Injection: 1.25 mg Bevacizumab 1.68m/0.05ml   Route: Intravitreal, Site: Right Eye   NDC: 50242-060-01, Lot:: [id number] Expiration date: 04/25/2022   Post-op Post injection exam found visual acuity  of at least counting fingers. The patient tolerated the procedure well. There were no complications. The patient received written and verbal post procedure care education. Post injection medications were not given.            ASSESSMENT/PLAN:    ICD-10-CM   1. Epiretinal membrane (ERM) of both eyes  H35.373 OCT, Retina - OU - Both Eyes    2. Moderate nonproliferative diabetic retinopathy of both eyes with macular edema associated with type 2 diabetes mellitus (HCC)  EY70.6237Intravitreal Injection, Pharmacologic Agent - OD - Right Eye    Bevacizumab (AVASTIN) SOLN 1.25 mg    3. Essential hypertension  I10     4. Hypertensive retinopathy of both eyes  H35.033     5. Pseudophakia of both eyes  Z96.1      1. Epiretinal membrane OU -- stable  - BCVA OD 20/30; OS 20/20  - OCT shows ERM with pucker and prominent central cyst; mild interval improvement in IRF  - IRF OD: ?DME vs CME vs cystic changes  - started PF and Prolensa QID OD only for possible CME component on 12.8.22 -- continue, but suspect DME may be significant component  - no metamorphopsia  - monitor for now  - f/u 5 weeks -- DFE/OCT  2. Moderate nonproliferative diabetic retinopathy OU  - s/p IVA OD #1 (02.16.23), #2 (03.20.23), #3 (04.17.23), #4 (05.22.23), #5 (06.22.23), #6 (07.27.23),  #7 (09.01.23) - BCVA OD: stable at 20/30, OS: 20/20  - exam shows scattered MA, no NV - OCT shows OD: ERM with pucker and prominent central cyst; IRF slightly improved; OS: mild ERM with early pucker, partial PVD  - recommend IVA OD #8 today, 10.06.23 for DME component w/ f/u at 5 wks - pt wishes to proceed - RBA of procedure discussed, questions answered - IVA informed consent obtained and signed, 02.16.23 (OD) - have obtained Eyle[employer] approval for 2023 - see procedure note - f/u in 5 wks - DFE/OCT, possible injxn  3,4. Hypertensive retinopathy OU - discussed importance of tight BP control - monitor   5. Pseudophakia OU  - s/p  CE/IOL OU, 2019 (Dr. HGeoffry [place]  - IOLs in good position, doing well  - s/p YAG cap OD 3.24.22  - s/p YAG cap OS 04.07.22  - monitor  Ophthalmic Meds Ordered this visit:  Meds ordered this encounter  Medications   Bevacizumab (AVASTIN) SOLN 1.25 mg     Return in about 5 weeks (around 04/20/2022) for f/u NPDR OU, DFE, OCT.  There are no Patient Instructions on file for this visit.  This document serves as a record of services personally performed by BGardiner Sleeper MD, PhD. It was created on their behalf by ASan Jetty BOwens Shark OA an ophthalmic technician. The creation of this record is the provider's dictation and/or activities during the visit.    Electronically signed by: ASan Jetty BOwens Shark O[place]09.26.2023 12:19 PM  BGardiner Sleeper M.D., Ph.D. Diseases & Surgery of the Retina and Vitreous [place] I have reviewed the above documentation for accuracy and completeness, and I agree with the above. BGardiner Sleeper M.D., Ph.D. 03/16/22 12:19 PM  Abbreviations: M myopia (nearsighted); A  astigmatism; H hyperopia (farsighted); P presbyopia; Mrx spectacle prescription;  CTL contact lenses; OD right eye; OS left eye; OU both eyes  XT exotropia; ET esotropia; PEK punctate epithelial keratitis; PEE punctate epithelial erosions; DES dry eye syndrome; MGD meibomian gland dysfunction; ATs artificial tears; PFAT's preservative free artificial tears; [place] nuclear sclerotic cataract; PSC posterior subcapsular cataract; ERM epi-retinal membrane; PVD posterior vitreous detachment; RD retinal detachment; DM diabetes mellitus; DR diabetic retinopathy; NPDR non-proliferative diabetic retinopathy; PDR proliferative diabetic retinopathy; CSME clinically significant macular edema; DME diabetic macular edema; dbh dot blot hemorrhages; CWS cotton wool spot; POAG primary open angle glaucoma; C/D cup-to-disc ratio; HVF humphrey visual field; GVF goldmann visual field; OCT optical coherence tomography;  IOP intraocular pressure; BRVO Branch retinal vein occlusion; CRVO central retinal vein occlusion; CRAO central retinal artery occlusion; BRAO branch retinal artery occlusion; RT retinal tear; SB scleral buckle; PPV pars plana vitrectomy; VH Vitreous hemorrhage; PRP panretinal laser photocoagulation; IVK intravitreal kenalog; VMT vitreomacular traction; MH Macular hole;  NVD neovascularization of the disc; NVE neovascularization elsewhere; AREDS age related eye disease study; ARMD age related macular degeneration; POAG primary open angle glaucoma; EBMD epithelial/anterior basement membrane dystrophy; ACIOL anterior chamber intraocular lens; IOL intraocular lens; PCIOL posterior chamber intraocular lens; Phaco/IOL phacoemulsification with intraocular lens placement; [place] photorefractive keratectomy; LASIK laser assisted in situ keratomileusis; HTN hypertension; DM diabetes mellitus; COPD chronic obstructive pulmonary disease

## 2022-03-16 ENCOUNTER — Ambulatory Visit (INDEPENDENT_AMBULATORY_CARE_PROVIDER_SITE_OTHER): Payer: Medicare Other | Admitting: Ophthalmology

## 2022-03-16 ENCOUNTER — Encounter (INDEPENDENT_AMBULATORY_CARE_PROVIDER_SITE_OTHER): Payer: Self-pay | Admitting: Ophthalmology

## 2022-03-16 DIAGNOSIS — Z961 Presence of intraocular lens: Secondary | ICD-10-CM

## 2022-03-16 DIAGNOSIS — I10 Essential (primary) hypertension: Secondary | ICD-10-CM

## 2022-03-16 DIAGNOSIS — E113313 Type 2 diabetes mellitus with moderate nonproliferative diabetic retinopathy with macular edema, bilateral: Secondary | ICD-10-CM

## 2022-03-16 DIAGNOSIS — H35033 Hypertensive retinopathy, bilateral: Secondary | ICD-10-CM | POA: Diagnosis not present

## 2022-03-16 DIAGNOSIS — H35373 Puckering of macula, bilateral: Secondary | ICD-10-CM

## 2022-03-16 MED ORDER — BEVACIZUMAB CHEMO INJECTION 1.25MG/0.05ML SYRINGE FOR KALEIDOSCOPE
1.2500 mg | INTRAVITREAL | Status: AC | PRN
  Administered 2022-03-16: 1.25 mg via INTRAVITREAL

## 2022-04-03 ENCOUNTER — Other Ambulatory Visit (HOSPITAL_COMMUNITY): Payer: Self-pay | Admitting: Internal Medicine

## 2022-04-03 DIAGNOSIS — Z1231 Encounter for screening mammogram for malignant neoplasm of breast: Secondary | ICD-10-CM

## 2022-04-11 ENCOUNTER — Ambulatory Visit (HOSPITAL_COMMUNITY)
Admission: RE | Admit: 2022-04-11 | Discharge: 2022-04-11 | Disposition: A | Discharge status: Home / Self Care | Payer: Medicare Other | Source: Ambulatory Visit | Attending: Internal Medicine | Admitting: Internal Medicine

## 2022-04-11 DIAGNOSIS — Z1231 Encounter for screening mammogram for malignant neoplasm of breast: Secondary | ICD-10-CM | POA: Insufficient documentation

## 2022-04-13 ENCOUNTER — Ambulatory Visit (HOSPITAL_COMMUNITY): Payer: Medicare Other

## 2022-04-16 ENCOUNTER — Other Ambulatory Visit (HOSPITAL_COMMUNITY): Payer: Self-pay | Admitting: Internal Medicine

## 2022-04-16 DIAGNOSIS — R928 Other abnormal and inconclusive findings on diagnostic imaging of breast: Secondary | ICD-10-CM

## 2022-04-17 ENCOUNTER — Ambulatory Visit (HOSPITAL_COMMUNITY)
Admission: RE | Admit: 2022-04-17 | Discharge: 2022-04-17 | Disposition: A | Discharge status: Home / Self Care | Payer: Medicare Other | Source: Ambulatory Visit | Attending: Internal Medicine | Admitting: Internal Medicine

## 2022-04-17 DIAGNOSIS — R928 Other abnormal and inconclusive findings on diagnostic imaging of breast: Secondary | ICD-10-CM | POA: Diagnosis not present

## 2022-04-17 DIAGNOSIS — R922 Inconclusive mammogram: Secondary | ICD-10-CM | POA: Diagnosis not present

## 2022-04-19 NOTE — Progress Notes (Signed)
. [facility] Note  04/20/2022     CHIEF COMPLAINT Patient presents for Retina Follow Up  HISTORY OF PRESENT ILLNESS: Megan Schroeder is a 76 y.o. female who presents to the clinic today for:   HPI     Retina Follow Up   Patient presents with  Other.  In both eyes.  This started 5.  Severity is moderate.  Duration of 5 weeks.  Since onset it is stable.  I, the attending physician,  performed the HPI with the patient and updated documentation appropriately.        Comments   5 week Retina follow up NPDR IVA OD pt states no vision changes noticed she is reporting blood sugar was 127 this am last A1C 7.1 3 months ago       Last edited by [name], MD on 04/20/2022 12:39 PM.    Pt states she is using PF and Prolensa and they are burning her eyes  Referring physician: [name], FNP [place],  [place] [address]  HISTORICAL INFORMATION:   Selected notes from the MEDICAL RECORD NUMBER Referred by Dr. [name]   CURRENT MEDICATIONS: Current Outpatient Medications (Ophthalmic Drugs)  Medication Sig   Bromfenac Sodium (PROLENSA) 0.07 % SOLN PLACE 1 DROP IN THE RIGHT EYE FOUR TIMES DAILY   prednisoLONE acetate (PRED FORTE) 1 % ophthalmic suspension Place 1 drop into the right eye 4 (four) times daily.   No current facility-administered medications for this visit. (Ophthalmic Drugs)   Current Outpatient Medications (Other)  Medication Sig   acetaminophen (TYLENOL) 325 MG tablet Take 2 tablets (650 mg total) by mouth every 6 (six) hours as needed for mild pain (or Fever >/= 101).   amLODipine (NORVASC) 10 MG tablet Take 1 tablet (10 mg total) by mouth daily. For BP   Ascorbic Acid (VITAMIN C) 1000 MG tablet Take 1,000 mg by mouth daily.   aspirin EC 81 MG tablet Take 1 tablet (81 mg total) by mouth daily with breakfast.   atorvastatin (LIPITOR) 20 MG tablet Take 1 tablet (20 mg total) by mouth every evening.   cholecalciferol  (VITAMIN D) 1000 UNITS tablet Take 1,000 Units by mouth daily.    Coenzyme Q10 (COQ-10) 200 MG CAPS Take 200 mg by mouth daily.    dapagliflozin propanediol (FARXIGA) 10 MG TABS tablet Take 1 tablet (10 mg total) by mouth daily.   GNP ULTICARE PEN NEEDLES 31G X 5 MM MISC See admin instructions.   levothyroxine (SYNTHROID) 75 MCG tablet Take 1 tablet (75 mcg total) by mouth daily before breakfast.   lisinopril (ZESTRIL) 40 MG tablet Take 40 mg by mouth daily.   metFORMIN (GLUCOPHAGE) 1000 MG tablet Take 1 tablet (1,000 mg total) by mouth 2 (two) times daily with a meal.   Omega-3 Fatty Acids (FISH OIL) 1200 MG CAPS Take 1,200 mg by mouth every other day.    pantoprazole (PROTONIX) 40 MG tablet Take 1 tablet (40 mg total) by mouth daily.   TOUJEO SOLOSTAR 300 UNIT/ML SOPN Inject 50 Units into the skin at bedtime.    TRULICITY 8.11 BJ/4.7WG SOPN Inject 0.75 mg into the skin once a week.   No current facility-administered medications for this visit. (Other)   REVIEW OF SYSTEMS:   ALLERGIES Allergies  Allergen Reactions   Penicillins Hives    Has patient had a PCN reaction causing immediate rash, facial/tongue/throat swelling, SOB or lightheadedness with hypotension: No Has patient  had a PCN reaction causing severe rash involving mucus membranes or skin necrosis: Yes Has patient had a PCN reaction that required hospitalization: No Has patient had a PCN reaction occurring within the last 10 years: No If all of the above answers are "NO", then may proceed with Cephalosporin use.     Hydrocodone Nausea And Vomiting   Sulfa Antibiotics    PAST MEDICAL HISTORY Past Medical History:  Diagnosis Date   Anxiety    Diabetic retinopathy ([place])    NPDR OU   Hyperlipidemia    Hypertension    Hypertensive retinopathy    OU   Hypothyroidism    Type 2 diabetes mellitus ([place])    Past Surgical History:  Procedure Laterality Date   BREAST EXCISIONAL BIOPSY Left    CATARACT EXTRACTION W/PHACO  Left 12/16/2017   Procedure: CATARACT EXTRACTION PHACO AND INTRAOCULAR LENS PLACEMENT ([place]);  Surgeon: [name], MD;  Location: [facility];  Service: Ophthalmology;  Laterality: Left;  CDE: 11.65   CATARACT EXTRACTION W/PHACO Right 12/30/2017   Procedure: CATARACT EXTRACTION PHACO AND INTRAOCULAR LENS PLACEMENT RIGHT EYE;  Surgeon: [name], MD;  Location: [facility];  Service: Ophthalmology;  Laterality: Right;  CDE: 9.07   COLONOSCOPY N/A 09/15/2015   Dr. Dudley Major multiple rectal and colonic polyps removed. Hepatic flexure with 9 mm polyp, multiple 5-7 mm polyps. Multiple cecal polyps with largest 1.5 cm, one 5 mm polyp in rectum. Path for colonic polyps with sessile serrated polyp without dysplasia, no high grade dysplasia, tubular adenomas, rectal hyperplastic polyp   COLONOSCOPY WITH PROPOFOL N/A 07/20/2019   Procedure: COLONOSCOPY WITH PROPOFOL;  Surgeon: [name], MD;  Location: [facility];  Service: Endoscopy;  Laterality: N/A;  8:30am   EYE SURGERY Bilateral 2019   Cat Sx - Dr. [name]   LEFT HEART CATHETERIZATION WITH CORONARY ANGIOGRAM N/A 07/23/2011   Procedure: LEFT HEART CATHETERIZATION WITH CORONARY ANGIOGRAM;  Surgeon: [name], MD;  Location: [facility] CATH LAB;  Service: Cardiovascular;  Laterality: N/A;   POLYPECTOMY  07/20/2019   Procedure: POLYPECTOMY;  Surgeon: [name], MD;  Location: [facility];  Service: Endoscopy;;   YAG LASER APPLICATION Right 33/00/7622   Dr. [name]   YAG LASER APPLICATION Left 63/33/5456   Dr. [name]   FAMILY HISTORY Family History  Problem Relation Age of Onset   Cancer Father    Diabetes Father    Colon cancer Neg Hx    SOCIAL HISTORY Social History   Tobacco Use   Smoking status: Never   Smokeless tobacco: Never  Vaping Use   Vaping Use: Never used  Substance Use Topics   Alcohol use: No   Drug use: No       OPHTHALMIC EXAM:  Base Eye Exam     Visual Acuity (Snellen - Linear)       Right Left   Dist cc  20/25 -2 20/20    Correction: Glasses         Tonometry (Tonopen, 8:17 AM)       Right Left   Pressure 17 15         Pupils       Pupils Dark Light Shape React APD   Right PERRL 3 2 Round Brisk None   Left PERRL 3 2 Round Brisk None         Visual Fields       Left Right    Full Full  Extraocular Movement       Right Left    Full, Ortho Full, Ortho         Neuro/Psych     Oriented x3: Yes   Mood/Affect: Normal         Dilation     Both eyes: 2.5% Phenylephrine @ 8:17 AM           Slit Lamp and Fundus Exam     Slit Lamp Exam       Right Left   Lids/Lashes Dermatochalasis - upper lid, mild MGD Dermatochalasis - upper lid, mild MGD   Conjunctiva/Sclera white and quiet white and quiet   Cornea 1-2+PEE, well healed temporal cataract wound 1+PEE, well healed temporal cataract wound   Anterior Chamber deep, clear, narrow temporal angle deep and clear   Iris round and dilated, No NVI round and dilated, No NVI   Lens PC IOL in good position, open PC PC IOL in good position with open PC   Anterior Vitreous syneresis, PVD syneresis, Posterior vitreous detachment, vitreous condensations         Fundus Exam       Right Left   Disc pink and sharp, compact, mild PPA pink and sharp, compact, mild PPP   C/D Ratio 0.3 0.1   Macula flat, blunted foveal reflex, ERM with striae inferiorly, punctate IRH, +central cyst / edema -- slightly improved, focal exudate superior macula, linear heme superior macula -- improving flat, blunted foveal reflex, mild ERM, rare MA, no edema, focal pigment clumping temporal to fovea   Vessels attenuated, tortuous greatest IT arcades attenuated, tortuous   Periphery attached, scattered MA / DBH greatest superior to disc - improving attached, rare MA/DBH, no RT/RD 360           Refraction     Wearing Rx       Sphere Cylinder Axis Add   Right [place]   +2.50   Left -0.25 +0.50 177 +2.50           IMAGING  AND PROCEDURES  Imaging and Procedures for 04/20/2022  OCT, Retina - OU - Both Eyes       Right Eye Quality was good. Central Foveal Thickness: 477. Progression has improved. Findings include no SRF, abnormal foveal contour, epiretinal membrane, intraretinal fluid, macular pucker (ERM with pucker and prominent central cyst; interval improvement in IRF ).   Left Eye Quality was good. Central Foveal Thickness: 266. Progression has been stable. Findings include normal foveal contour, no IRF, no SRF, epiretinal membrane, macular pucker.   Notes *Images captured and stored on drive  Diagnosis / Impression:  OD: ERM with pucker and prominent central cyst; interval improvement in IRF  OS: mild ERM with early pucker, partial PVD   Clinical management:  See below  Abbreviations: NFP - Normal foveal profile. CME - cystoid macular edema. PED - pigment epithelial detachment. IRF - intraretinal fluid. SRF - subretinal fluid. EZ - ellipsoid zone. ERM - epiretinal membrane. ORA - outer retinal atrophy. ORT - outer retinal tubulation. SRHM - subretinal hyper-reflective material. IRHM - intraretinal hyper-reflective material      Intravitreal Injection, Pharmacologic Agent - OD - Right Eye       Time Out 04/20/2022. 9:24 AM. Confirmed correct patient, procedure, site, and patient consented.   Anesthesia Topical anesthesia was used. Anesthetic medications included Lidocaine 2%, Proparacaine 0.5%.   Procedure Preparation included 5% betadine to ocular surface, eyelid speculum. A (32g) needle was used.   Injection: 1.25 mg  Bevacizumab 1.'25mg'$ /0.42m   Route: Intravitreal, Site: Right Eye   NDC: [id number] Lot:: [id number] Expiration date: 05/03/2022   Post-op Post injection exam found visual acuity of at least counting fingers. The patient tolerated the procedure well. There were no complications. The patient received written and verbal post procedure care education. Post injection  medications were not given.            ASSESSMENT/PLAN:    ICD-10-CM   1. Epiretinal membrane (ERM) of both eyes  H35.373 OCT, Retina - OU - Both Eyes    2. Moderate nonproliferative diabetic retinopathy of both eyes with macular edema associated with type 2 diabetes mellitus (HCC)  E11.3313 OCT, Retina - OU - Both Eyes    Intravitreal Injection, Pharmacologic Agent - OD - Right Eye    Bevacizumab (AVASTIN) SOLN 1.25 mg    3. Essential hypertension  I10     4. Hypertensive retinopathy of both eyes  H35.033     5. Pseudophakia of both eyes  Z96.1      1. Epiretinal membrane OU -- stable  - BCVA OD 20/30; OS 20/20  - OCT shows ERM with pucker and prominent central cyst; mild interval improvement in IRF  - IRF OD: ?DME vs CME vs cystic changes  - started PF and Prolensa QID OD only for possible CME component on 12.8.22 -- continue, but suspect DME may be significant component  - no metamorphopsia  - monitor for now  - f/u 5 weeks -- DFE/OCT  2. Moderate nonproliferative diabetic retinopathy OU  - s/p IVA OD #1 (02.16.23), #2 (03.20.23), #3 (04.17.23), #4 (05.22.23), #5 (06.22.23), #6 (07.27.23),  #7 (09.01.23) #8 (10.06.23) - BCVA OD: stable at 20/30, OS: 20/20  - exam shows scattered MA, no NV - OCT shows OD: ERM with pucker and prominent central cyst; IRF slightly improved; OS: mild ERM with early pucker, partial PVD  - recommend IVA OD #9 today, 11.09.23 for DME component w/ f/u at 5 wks - pt wishes to proceed - RBA of procedure discussed, questions answered - IVA informed consent obtained and signed, 02.16.23 (OD) - have obtained Eylea approval for 2023, but continuing with Avastin for now due to good response - see procedure note  - f/u in 5 wks - DFE/OCT, possible injxn  3,4. Hypertensive retinopathy OU - discussed importance of tight BP control - monitor   5. Pseudophakia OU  - s/p CE/IOL OU, 2019 (Dr. HGeoffry Paradise  - IOLs in good position, doing well  - s/p YAG cap  OD 3.24.22  - s/p YAG cap OS 04.07.22  - monitor  Ophthalmic Meds Ordered this visit:  Meds ordered this encounter  Medications   Bevacizumab (AVASTIN) SOLN 1.25 mg     Return in about 5 weeks (around 05/25/2022) for f/u NPDR OU, DFE, OCT.  There are no Patient Instructions on file for this visit.  This document serves as a record of services personally performed by BGardiner Sleeper MD, PhD. It was created on their behalf by ASan Jetty BOwens Shark OA an ophthalmic technician. The creation of this record is the provider's dictation and/or activities during the visit.    Electronically signed by: ASan Jetty BOwens Shark O[place]11.10.2023 12:40 PM  BGardiner Sleeper M.D., Ph.D. Diseases & Surgery of the Retina and Vitreous [place] [place] I have reviewed the above documentation for accuracy and completeness, and I agree with the above. BGardiner Sleeper M.D., Ph.D. 04/20/22 12:41 PM  Abbreviations: MJerilynn Mages  myopia (nearsighted); A astigmatism; H hyperopia (farsighted); P presbyopia; Mrx spectacle prescription;  CTL contact lenses; OD right eye; OS left eye; OU both eyes  XT exotropia; ET esotropia; PEK punctate epithelial keratitis; PEE punctate epithelial erosions; DES dry eye syndrome; MGD meibomian gland dysfunction; ATs artificial tears; PFAT's preservative free artificial tears; [place] nuclear sclerotic cataract; PSC posterior subcapsular cataract; ERM epi-retinal membrane; PVD posterior vitreous detachment; RD retinal detachment; DM diabetes mellitus; DR diabetic retinopathy; NPDR non-proliferative diabetic retinopathy; PDR proliferative diabetic retinopathy; CSME clinically significant macular edema; DME diabetic macular edema; dbh dot blot hemorrhages; CWS cotton wool spot; POAG primary open angle glaucoma; C/D cup-to-disc ratio; HVF humphrey visual field; GVF goldmann visual field; OCT optical coherence tomography; IOP intraocular pressure; BRVO Branch retinal vein occlusion; CRVO central  retinal vein occlusion; CRAO central retinal artery occlusion; BRAO branch retinal artery occlusion; RT retinal tear; SB scleral buckle; PPV pars plana vitrectomy; VH Vitreous hemorrhage; PRP panretinal laser photocoagulation; IVK intravitreal kenalog; VMT vitreomacular traction; MH Macular hole;  NVD neovascularization of the disc; NVE neovascularization elsewhere; AREDS age related eye disease study; ARMD age related macular degeneration; POAG primary open angle glaucoma; EBMD epithelial/anterior basement membrane dystrophy; ACIOL anterior chamber intraocular lens; IOL intraocular lens; PCIOL posterior chamber intraocular lens; Phaco/IOL phacoemulsification with intraocular lens placement; [place] photorefractive keratectomy; LASIK laser assisted in situ keratomileusis; HTN hypertension; DM diabetes mellitus; COPD chronic obstructive pulmonary disease

## 2022-04-20 ENCOUNTER — Ambulatory Visit (INDEPENDENT_AMBULATORY_CARE_PROVIDER_SITE_OTHER): Payer: Medicare Other | Admitting: Ophthalmology

## 2022-04-20 ENCOUNTER — Encounter (INDEPENDENT_AMBULATORY_CARE_PROVIDER_SITE_OTHER): Payer: Self-pay | Admitting: Ophthalmology

## 2022-04-20 DIAGNOSIS — E113313 Type 2 diabetes mellitus with moderate nonproliferative diabetic retinopathy with macular edema, bilateral: Secondary | ICD-10-CM

## 2022-04-20 DIAGNOSIS — H35033 Hypertensive retinopathy, bilateral: Secondary | ICD-10-CM | POA: Diagnosis not present

## 2022-04-20 DIAGNOSIS — Z961 Presence of intraocular lens: Secondary | ICD-10-CM

## 2022-04-20 DIAGNOSIS — I10 Essential (primary) hypertension: Secondary | ICD-10-CM

## 2022-04-20 DIAGNOSIS — H35373 Puckering of macula, bilateral: Secondary | ICD-10-CM

## 2022-04-20 MED ORDER — BEVACIZUMAB CHEMO INJECTION 1.25MG/0.05ML SYRINGE FOR KALEIDOSCOPE
1.2500 mg | INTRAVITREAL | Status: AC | PRN
  Administered 2022-04-20: 1.25 mg via INTRAVITREAL

## 2022-05-14 DIAGNOSIS — J019 Acute sinusitis, unspecified: Secondary | ICD-10-CM | POA: Diagnosis not present

## 2022-05-18 DIAGNOSIS — E785 Hyperlipidemia, unspecified: Secondary | ICD-10-CM | POA: Diagnosis not present

## 2022-05-18 DIAGNOSIS — E1165 Type 2 diabetes mellitus with hyperglycemia: Secondary | ICD-10-CM | POA: Diagnosis not present

## 2022-05-18 DIAGNOSIS — E039 Hypothyroidism, unspecified: Secondary | ICD-10-CM | POA: Diagnosis not present

## 2022-05-22 ENCOUNTER — Other Ambulatory Visit (HOSPITAL_COMMUNITY): Payer: Self-pay | Admitting: Family Medicine

## 2022-05-22 ENCOUNTER — Ambulatory Visit (HOSPITAL_COMMUNITY)
Admission: RE | Admit: 2022-05-22 | Discharge: 2022-05-22 | Disposition: A | Discharge status: Home / Self Care | Payer: Medicare Other | Source: Ambulatory Visit | Attending: Family Medicine | Admitting: Family Medicine

## 2022-05-22 DIAGNOSIS — I10 Essential (primary) hypertension: Secondary | ICD-10-CM | POA: Diagnosis not present

## 2022-05-22 DIAGNOSIS — R809 Proteinuria, unspecified: Secondary | ICD-10-CM | POA: Diagnosis not present

## 2022-05-22 DIAGNOSIS — J019 Acute sinusitis, unspecified: Secondary | ICD-10-CM | POA: Diagnosis not present

## 2022-05-22 DIAGNOSIS — E785 Hyperlipidemia, unspecified: Secondary | ICD-10-CM | POA: Diagnosis not present

## 2022-05-22 DIAGNOSIS — I700 Atherosclerosis of aorta: Secondary | ICD-10-CM | POA: Diagnosis not present

## 2022-05-22 DIAGNOSIS — R059 Cough, unspecified: Secondary | ICD-10-CM | POA: Insufficient documentation

## 2022-05-22 DIAGNOSIS — E1165 Type 2 diabetes mellitus with hyperglycemia: Secondary | ICD-10-CM | POA: Diagnosis not present

## 2022-05-22 DIAGNOSIS — E039 Hypothyroidism, unspecified: Secondary | ICD-10-CM | POA: Diagnosis not present

## 2022-05-22 NOTE — Progress Notes (Signed)
. [facility] Note  05/25/2022     CHIEF COMPLAINT Patient presents for Retina Follow Up  HISTORY OF PRESENT ILLNESS: Megan Schroeder is a 76 y.o. female who presents to the clinic today for:   HPI     Retina Follow Up   Patient presents with  Other.  In both eyes.  Severity is moderate.  Duration of 5 weeks.  Since onset it is stable.  I, the attending physician,  performed the HPI with the patient and updated documentation appropriately.        Comments   Pt here or 5 wk ret f/u ERM OU. Pt states [facility] is doing well. She has been on antibiotics for the flu for 2 weeks, feeling better. Pts recent A1C was 7.1, received report Monday 05/21/22.      Last edited by [name], MD on 05/25/2022  9:33 AM.    Pt states she has been sick for 2 weeks, she is on doxycycline and tessalon pearls  Referring physician: [name], FNP [place],  [place] [id number]  HISTORICAL INFORMATION:   Selected notes from the MEDICAL RECORD NUMBER Referred by Dr. [name]   CURRENT MEDICATIONS: Current Outpatient Medications (Ophthalmic Drugs)  Medication Sig   Bromfenac Sodium (PROLENSA) 0.07 % SOLN PLACE 1 DROP IN THE RIGHT EYE FOUR TIMES DAILY   prednisoLONE acetate (PRED FORTE) 1 % ophthalmic suspension Place 1 drop into the right eye 4 (four) times daily.   No current facility-administered medications for this visit. (Ophthalmic Drugs)   Current Outpatient Medications (Other)  Medication Sig   acetaminophen (TYLENOL) 325 MG tablet Take 2 tablets (650 mg total) by mouth every 6 (six) hours as needed for mild pain (or Fever >/= 101).   amLODipine (NORVASC) 10 MG tablet Take 1 tablet (10 mg total) by mouth daily. For BP   Ascorbic Acid (VITAMIN C) 1000 MG tablet Take 1,000 mg by mouth daily.   aspirin EC 81 MG tablet Take 1 tablet (81 mg total) by mouth daily with breakfast.   atorvastatin (LIPITOR) 20 MG tablet Take 1 tablet (20 mg total) by  mouth every evening.   benzonatate (TESSALON) 100 MG capsule Take by mouth 3 (three) times daily.   cholecalciferol (VITAMIN D) 1000 UNITS tablet Take 1,000 Units by mouth daily.    Coenzyme Q10 (COQ-10) 200 MG CAPS Take 200 mg by mouth daily.    dapagliflozin propanediol (FARXIGA) 10 MG TABS tablet Take 1 tablet (10 mg total) by mouth daily.   doxycycline (ADOXA) 100 MG tablet Take 100 mg by mouth 2 (two) times daily. 10 days ending 05/31/22.   GNP ULTICARE PEN NEEDLES 31G X 5 MM MISC See admin instructions.   levothyroxine (SYNTHROID) 75 MCG tablet Take 1 tablet (75 mcg total) by mouth daily before breakfast.   lisinopril (ZESTRIL) 40 MG tablet Take 40 mg by mouth daily.   metFORMIN (GLUCOPHAGE) 1000 MG tablet Take 1 tablet (1,000 mg total) by mouth 2 (two) times daily with a meal.   Omega-3 Fatty Acids (FISH OIL) 1200 MG CAPS Take 1,200 mg by mouth every other day.    pantoprazole (PROTONIX) 40 MG tablet Take 1 tablet (40 mg total) by mouth daily.   TOUJEO SOLOSTAR 300 UNIT/ML SOPN Inject 50 Units into the skin at bedtime.    TRULICITY 0.86 PY/1.9JK SOPN Inject 0.75 mg into the skin once a week.   No current facility-administered medications for this  visit. (Other)   REVIEW OF SYSTEMS: ROS   Positive for: Gastrointestinal, Endocrine, Cardiovascular, Eyes Negative for: Constitutional, Neurological, Skin, Genitourinary, Musculoskeletal, HENT, Respiratory, Psychiatric, Allergic/Imm, Heme/Lymph Last edited by Kingsley Spittle, COT on 05/25/2022  8:18 AM.     ALLERGIES Allergies  Allergen Reactions   Penicillins Hives    Has patient had a PCN reaction causing immediate rash, facial/tongue/throat swelling, SOB or lightheadedness with hypotension: No Has patient had a PCN reaction causing severe rash involving mucus membranes or skin necrosis: Yes Has patient had a PCN reaction that required hospitalization: No Has patient had a PCN reaction occurring within the last 10 years: No If  all of the above answers are "NO", then may proceed with Cephalosporin use.     Hydrocodone Nausea And Vomiting   Sulfa Antibiotics    PAST MEDICAL HISTORY Past Medical History:  Diagnosis Date   Anxiety    Diabetic retinopathy ([employer])    NPDR OU   Hyperlipidemia    Hypertension    Hypertensive retinopathy    OU   Hypothyroidism    Type 2 diabetes mellitus ([place])    Past Surgical History:  Procedure Laterality Date   BREAST EXCISIONAL BIOPSY Left    CATARACT EXTRACTION W/PHACO Left 12/16/2017   Procedure: CATARACT EXTRACTION PHACO AND INTRAOCULAR LENS PLACEMENT ([place]);  Surgeon: [name], MD;  Location: [facility];  Service: Ophthalmology;  Laterality: Left;  CDE: 11.65   CATARACT EXTRACTION W/PHACO Right 12/30/2017   Procedure: CATARACT EXTRACTION PHACO AND INTRAOCULAR LENS PLACEMENT RIGHT EYE;  Surgeon: [name], MD;  Location: [facility];  Service: Ophthalmology;  Laterality: Right;  CDE: 9.07   COLONOSCOPY N/A 09/15/2015   Dr. Dudley Major multiple rectal and colonic polyps removed. Hepatic flexure with 9 mm polyp, multiple 5-7 mm polyps. Multiple cecal polyps with largest 1.5 cm, one 5 mm polyp in rectum. Path for colonic polyps with sessile serrated polyp without dysplasia, no high grade dysplasia, tubular adenomas, rectal hyperplastic polyp   COLONOSCOPY WITH PROPOFOL N/A 07/20/2019   Procedure: COLONOSCOPY WITH PROPOFOL;  Surgeon: [name], MD;  Location: [facility];  Service: Endoscopy;  Laterality: N/A;  8:30am   EYE SURGERY Bilateral 2019   Cat Sx - Dr. [name]   LEFT HEART CATHETERIZATION WITH CORONARY ANGIOGRAM N/A 07/23/2011   Procedure: LEFT HEART CATHETERIZATION WITH CORONARY ANGIOGRAM;  Surgeon: [name], MD;  Location[facility] CATH LAB;  Service: Cardiovascular;  Laterality: N/A;   POLYPECTOMY  07/20/2019   Procedure: POLYPECTOMY;  Surgeon: [name], MD;  Location: [facility];  Service: Endoscopy;;   YAG LASER APPLICATION Right 81/19/1478   Dr. [name]    YAG LASER APPLICATION Left 29/56/2130   Dr. [name]   FAMILY HISTORY Family History  Problem Relation Age of Onset   Cancer Father    Diabetes Father    Colon cancer Neg Hx    SOCIAL HISTORY Social History   Tobacco Use   Smoking status: Never   Smokeless tobacco: Never  Vaping Use   Vaping Use: Never used  Substance Use Topics   Alcohol use: No   Drug use: No       OPHTHALMIC EXAM:  Base Eye Exam     Visual Acuity (Snellen - Linear)       Right Left   Dist cc 20/30 -2 20/20   Dist ph cc NI     Correction: Glasses         Tonometry (Tonopen, 8:27  AM)       Right Left   Pressure 17 18         Pupils       Pupils Dark Light Shape React APD   Right PERRL 3 2 Round Brisk None   Left PERRL 3 2 Round Brisk None         Visual Fields (Counting fingers)       Left Right    Full Full         Extraocular Movement       Right Left    Full, Ortho Full, Ortho         Neuro/Psych     Oriented x3: Yes   Mood/Affect: Normal           Slit Lamp and Fundus Exam     Slit Lamp Exam       Right Left   Lids/Lashes Dermatochalasis - upper lid, mild MGD Dermatochalasis - upper lid, mild MGD   Conjunctiva/Sclera white and quiet white and quiet   Cornea 1-2+PEE, well healed temporal cataract wound 1+PEE, well healed temporal cataract wound   Anterior Chamber deep, clear, narrow temporal angle deep and clear   Iris round and dilated, No NVI round and dilated, No NVI   Lens PC IOL in good position, open PC PC IOL in good position with open PC   Anterior Vitreous syneresis, PVD syneresis, Posterior vitreous detachment, vitreous condensations         Fundus Exam       Right Left   Disc pink and sharp, compact, mild PPA pink and sharp, compact, mild PPP   C/D Ratio 0.3 0.1   Macula flat, blunted foveal reflex, ERM with striae inferiorly, punctate IRH, +central cyst / edema -- slightly increased, focal exudate superior to fovea flat, blunted  foveal reflex, mild ERM, rare MA, no edema, focal pigment clumping temporal to fovea   Vessels attenuated, tortuous greatest IT arcades attenuated, tortuous   Periphery attached, scattered MA / DBH greatest superior to disc - improving attached, rare MA/DBH, no RT/RD 360           Refraction     Wearing Rx       Sphere Cylinder Axis Add   Right [place]   +2.50   Left -0.25 +0.50 177 +2.50         Manifest Refraction       Sphere Cylinder Axis Dist [facility]   Right [place] +0.25 180 20/25-2   Left               IMAGING AND PROCEDURES  Imaging and Procedures for 05/25/2022  OCT, Retina - OU - Both Eyes       Right Eye Quality was good. Central Foveal Thickness: 508. Progression has worsened. Findings include no SRF, abnormal foveal contour, epiretinal membrane, intraretinal fluid, macular pucker (ERM with pucker and prominent central cyst; interval increase in IRF and central cyst).   Left Eye Quality was good. Central Foveal Thickness: 265. Progression has been stable. Findings include normal foveal contour, no IRF, no SRF, epiretinal membrane, macular pucker.   Notes *Images captured and stored on drive  Diagnosis / Impression:  OD: ERM with pucker and prominent central cyst; interval increase in IRF and central cyst OS: mild ERM with early pucker, partial PVD   Clinical management:  See below  Abbreviations: NFP - Normal foveal profile. CME - cystoid macular edema. PED - pigment epithelial detachment. IRF - intraretinal fluid. SRF -  subretinal fluid. EZ - ellipsoid zone. ERM - epiretinal membrane. ORA - outer retinal atrophy. ORT - outer retinal tubulation. SRHM - subretinal hyper-reflective material. IRHM - intraretinal hyper-reflective material      Intravitreal Injection, Pharmacologic Agent - OD - Right Eye       Time Out 05/25/2022. 9:13 AM. Confirmed correct patient, procedure, site, and patient consented.   Anesthesia Topical anesthesia was used.  Anesthetic medications included Lidocaine 2%, Proparacaine 0.5%.   Procedure Preparation included 5% betadine to ocular surface, eyelid speculum. A (32g) needle was used.   Injection: 1.25 mg Bevacizumab 1.74m/0.05ml   Route: Intravitreal, Site: Right Eye   NDC: 50242-060-01, Lot: [id number] , Expiration date: 06/18/2022   Post-op Post injection exam found visual acuity of at least counting fingers. The patient tolerated the procedure well. There were no complications. The patient received written and verbal post procedure care education. Post injection medications were not given.            ASSESSMENT/PLAN:    ICD-10-CM   1. Epiretinal membrane (ERM) of both eyes  H35.373 OCT, Retina - OU - Both Eyes    2. Moderate nonproliferative diabetic retinopathy of both eyes with macular edema associated with type 2 diabetes mellitus (HCC)  E11.3313 OCT, Retina - OU - Both Eyes    Intravitreal Injection, Pharmacologic Agent - OD - Right Eye    Bevacizumab (AVASTIN) SOLN 1.25 mg    3. Essential hypertension  I10     4. Hypertensive retinopathy of both eyes  H35.033     5. Pseudophakia of both eyes  Z96.1      1. Epiretinal membrane OU -- stable  - BCVA OD 20/30; OS 20/20  - OCT shows ERM with pucker and prominent central cyst; interval increase in IRF and central cyst   - IRF OD: ?DME vs CME vs cystic changes  - started PF and Prolensa QID OD only for possible CME component on 12.8.22 -- continue, but suspect DME may be significant component  - no metamorphopsia  - monitor for now  - f/u 5 weeks -- DFE/OCT  2. Moderate nonproliferative diabetic retinopathy OU  - s/p IVA OD #1 (02.16.23), #2 (03.20.23), #3 (04.17.23), #4 (05.22.23), #5 (06.22.23), #6 (07.27.23),  #7 (09.01.23) #8 (10.06.23), #9 (11.09.23) - BCVA OD: stable at 20/30, OS: 20/20  - exam shows scattered MA, no NV - OCT shows OD: ERM with pucker and prominent central cyst; interval increase in IRF and central cyst; OS:  mild ERM with early pucker, partial PVD at 5 wks  - recommend IVA OD #10 today, 12.15.23 for DME component w/ f/u in 4 wks - pt wishes to proceed - RBA of procedure discussed, questions answered - IVA informed consent obtained and signed, 02.16.23 (OD) - have obtained Eylea approval for 2023, but continuing with Avastin for now due to good response - see procedure note  - f/u in 4 wks - DFE/OCT, possible injxn  3,4. Hypertensive retinopathy OU - discussed importance of tight BP control - monitor   5. Pseudophakia OU  - s/p CE/IOL OU, 2019 (Dr. HGeoffry Paradise  - IOLs in good position, doing well  - s/p YAG cap OD 3.24.22  - s/p YAG cap OS 04.07.22  - monitor  Ophthalmic Meds Ordered this visit:  Meds ordered this encounter  Medications   Bevacizumab (AVASTIN) SOLN 1.25 mg     Return in about 4 weeks (around 06/22/2022) for f/u NPDR OU, DFE, OCT.  There are no Patient Instructions  on file for this visit.  This document serves as a record of services personally performed by Gardiner Sleeper, MD, PhD. It was created on their behalf by San Jetty. Owens Shark, OA an ophthalmic technician. The creation of this record is the provider's dictation and/or activities during the visit.    Electronically signed by: San Jetty. Owens Shark, [place] 12.12.2023 1:45 AM  Gardiner Sleeper, M.D., Ph.D. Diseases & Surgery of the Retina and Vitreous [facility]  I have reviewed the above documentation for accuracy and completeness, and I agree with the above. Gardiner Sleeper, M.D., Ph.D. 05/26/22 1:48 AM  Abbreviations: M myopia (nearsighted); A astigmatism; H hyperopia (farsighted); P presbyopia; Mrx spectacle prescription;  CTL contact lenses; OD right eye; OS left eye; OU both eyes  XT exotropia; ET esotropia; PEK punctate epithelial keratitis; PEE punctate epithelial erosions; DES dry eye syndrome; MGD meibomian gland dysfunction; ATs artificial tears; PFAT's preservative free artificial tears; [place]  nuclear sclerotic cataract; PSC posterior subcapsular cataract; ERM epi-retinal membrane; PVD posterior vitreous detachment; RD retinal detachment; DM diabetes mellitus; DR diabetic retinopathy; NPDR non-proliferative diabetic retinopathy; PDR proliferative diabetic retinopathy; CSME clinically significant macular edema; DME diabetic macular edema; dbh dot blot hemorrhages; CWS cotton wool spot; POAG primary open angle glaucoma; C/D cup-to-disc ratio; HVF humphrey visual field; GVF goldmann visual field; OCT optical coherence tomography; IOP intraocular pressure; BRVO Branch retinal vein occlusion; CRVO central retinal vein occlusion; CRAO central retinal artery occlusion; BRAO branch retinal artery occlusion; RT retinal tear; SB scleral buckle; PPV pars plana vitrectomy; VH Vitreous hemorrhage; PRP panretinal laser photocoagulation; IVK intravitreal kenalog; VMT vitreomacular traction; MH Macular hole;  NVD neovascularization of the disc; NVE neovascularization elsewhere; AREDS age related eye disease study; ARMD age related macular degeneration; POAG primary open angle glaucoma; EBMD epithelial/anterior basement membrane dystrophy; ACIOL anterior chamber intraocular lens; IOL intraocular lens; PCIOL posterior chamber intraocular lens; Phaco/IOL phacoemulsification with intraocular lens placement; [place] photorefractive keratectomy; LASIK laser assisted in situ keratomileusis; HTN hypertension; DM diabetes mellitus; COPD chronic obstructive pulmonary disease

## 2022-05-25 ENCOUNTER — Encounter (INDEPENDENT_AMBULATORY_CARE_PROVIDER_SITE_OTHER): Payer: Self-pay | Admitting: Ophthalmology

## 2022-05-25 ENCOUNTER — Ambulatory Visit (INDEPENDENT_AMBULATORY_CARE_PROVIDER_SITE_OTHER): Payer: Medicare Other | Admitting: Ophthalmology

## 2022-05-25 DIAGNOSIS — H35373 Puckering of macula, bilateral: Secondary | ICD-10-CM

## 2022-05-25 DIAGNOSIS — H35033 Hypertensive retinopathy, bilateral: Secondary | ICD-10-CM

## 2022-05-25 DIAGNOSIS — Z961 Presence of intraocular lens: Secondary | ICD-10-CM

## 2022-05-25 DIAGNOSIS — E113313 Type 2 diabetes mellitus with moderate nonproliferative diabetic retinopathy with macular edema, bilateral: Secondary | ICD-10-CM | POA: Diagnosis not present

## 2022-05-25 DIAGNOSIS — I10 Essential (primary) hypertension: Secondary | ICD-10-CM | POA: Diagnosis not present

## 2022-05-25 MED ORDER — BEVACIZUMAB CHEMO INJECTION 1.25MG/0.05ML SYRINGE FOR KALEIDOSCOPE
1.2500 mg | INTRAVITREAL | Status: AC | PRN
  Administered 2022-05-25: 1.25 mg via INTRAVITREAL

## 2022-05-29 DIAGNOSIS — L84 Corns and callosities: Secondary | ICD-10-CM | POA: Diagnosis not present

## 2022-05-29 DIAGNOSIS — E1142 Type 2 diabetes mellitus with diabetic polyneuropathy: Secondary | ICD-10-CM | POA: Diagnosis not present

## 2022-05-29 DIAGNOSIS — B351 Tinea unguium: Secondary | ICD-10-CM | POA: Diagnosis not present

## 2022-05-29 DIAGNOSIS — M79676 Pain in unspecified toe(s): Secondary | ICD-10-CM | POA: Diagnosis not present

## 2022-06-06 ENCOUNTER — Other Ambulatory Visit (INDEPENDENT_AMBULATORY_CARE_PROVIDER_SITE_OTHER): Payer: Self-pay | Admitting: Ophthalmology

## 2022-06-06 DIAGNOSIS — I10 Essential (primary) hypertension: Secondary | ICD-10-CM | POA: Diagnosis not present

## 2022-06-06 DIAGNOSIS — Z79899 Other long term (current) drug therapy: Secondary | ICD-10-CM | POA: Diagnosis not present

## 2022-06-07 ENCOUNTER — Ambulatory Visit: Payer: Medicare Other | Admitting: Nurse Practitioner

## 2022-06-07 NOTE — Progress Notes (Deleted)
Cardiology Office Note:    Date:  06/07/2022   ID:  Megan Schroeder, DOB [birth year], MRN [id number]  PCP:  [name], [place] Prov[facility] Cardiologist:  Rozann Lesches, MD { Click to update primary MD,subspecialty MD or APP then REFRESH:1}    Referring MD: [name], FNP   No chief complaint on file. ***  History of Present Illness:    Megan Schroeder is a 76 y.o. female with a hx of ***   CAD HTN T2DM HLD Hypothyroidism  Patient is a 76 year old female with past medical history as mentioned above.  In 2021 she presented to [facility] for chest pain with associated symptoms of dizziness, palpitations, and nausea.  She denied any diaphoresis or radiation of chest pain, as well as shortness of breath.  She also noted palpitations that were bothersome.  Troponins were flat.  EKG did not show any acute ischemic changes.  NST was negative for ischemia, low risk.  Denied any recurrent chest pain at follow-up.   Last seen by Dr. Domenic Polite on May 30, 2021.  Was doing very well and denied any chest pain or shortness of breath.  She was very active and exercises 6 evenings per week, reported walking on treadmill and using stationary bicycle.  She was instructed to closely monitor her blood pressure at home.  Instructed to follow-up in 1 year.  Today she presents for 1 year follow-up.  She states.  Past Medical History:  Diagnosis Date   Anxiety    Diabetic retinopathy ([place])    NPDR OU   Hyperlipidemia    Hypertension    Hypertensive retinopathy    OU   Hypothyroidism    Type 2 diabetes mellitus ([place])     Past Surgical History:  Procedure Laterality Date   BREAST EXCISIONAL BIOPSY Left    CATARACT EXTRACTION W/PHACO Left 12/16/2017   Procedure: CATARACT EXTRACTION PHACO AND INTRAOCULAR LENS PLACEMENT ([place]);  Surgeon: [name], MD;  Location: [facility];  Service: Ophthalmology;  Laterality: Left;  CDE: 11.65   CATARACT EXTRACTION  W/PHACO Right 12/30/2017   Procedure: CATARACT EXTRACTION PHACO AND INTRAOCULAR LENS PLACEMENT RIGHT EYE;  Surgeon: [name], MD;  Location: [facility];  Service: Ophthalmology;  Laterality: Right;  CDE: 9.07   COLONOSCOPY N/A 09/15/2015   Dr. Dudley Major multiple rectal and colonic polyps removed. Hepatic flexure with 9 mm polyp, multiple 5-7 mm polyps. Multiple cecal polyps with largest 1.5 cm, one 5 mm polyp in rectum. Path for colonic polyps with sessile serrated polyp without dysplasia, no high grade dysplasia, tubular adenomas, rectal hyperplastic polyp   COLONOSCOPY WITH PROPOFOL N/A 07/20/2019   Procedure: COLONOSCOPY WITH PROPOFOL;  Surgeon: [name], MD;  Location[facility];  Service: Endoscopy;  Laterality: N/A;  8:30am   EYE SURGERY Bilateral 2019   Cat Sx - Dr. [name]   LEFT HEART CATHETERIZATION WITH CORONARY ANGIOGRAM N/A 07/23/2011   Procedure: LEFT HEART CATHETERIZATION WITH CORONARY ANGIOGRAM;  Surgeon: [name], MD;  Location: [facility] CATH LAB;  Service: Cardiovascular;  Laterality: N/A;   POLYPECTOMY  07/20/2019   Procedure: POLYPECTOMY;  Surgeon: [name], MD;  [facility];  Service: Endoscopy;;   YAG LASER APPLICATION Right 17/79/3903   Dr. [name]   YAG LASER APPLICATION Left 00/92/3300   Dr. [name]    Current Medications: No outpatient medications have been marked as taking for the 06/07/22 encounter (Appointment) with [place] Calix,  Benjamine Mola, NP.     Allergies:   Penicillins, Hydrocodone, and Sulfa antibiotics   Social History   Socioeconomic History   Marital status: Widowed    Spouse name: Not on file   Number of children: Not on file   Years of education: Not on file   Highest education level: Not on file  Occupational History   Not on file  Tobacco Use   Smoking status: Never   Smokeless tobacco: Never  Vaping Use   Vaping Use: Never used  Substance and Sexual Activity   Alcohol use: No   Drug use: No   Sexual activity: Yes     Birth control/protection: Post-menopausal  Other Topics Concern   Not on file  Social History Narrative   Not on file   Social Determinants of Health   Financial Resource Strain: Not on file  Food Insecurity: Not on file  Transportation Needs: Not on file  Physical Activity: Not on file  Stress: Not on file  Social Connections: Not on file     Family History: The patient's ***family history includes Cancer in her father; Diabetes in her father. There is no history of Colon cancer.  ROS:   Please see the history of present illness.    *** All other systems reviewed and are negative.  EKGs/Labs/Other Studies Reviewed:    The following studies were reviewed today: ***  EKG:  EKG is *** ordered today.  The ekg ordered today demonstrates ***  Recent Labs: No results found for requested labs within last 365 days.  Recent Lipid Panel    Component Value Date/Time   CHOL 147 07/24/2011 0516   TRIG 147 07/24/2011 0516   HDL 42 07/24/2011 0516   CHOLHDL 3.5 07/24/2011 0516   VLDL 29 07/24/2011 0516   LDLCALC 76 07/24/2011 0516     Risk Assessment/Calculations:   {Does this patient have ATRIAL FIBRILLATION?:[phone]}  No BP recorded.  {Refresh Note OR Click here to enter BP  :1}***         Physical Exam:    VS:  There were no vitals taken for this visit.    Wt Readings from Last 3 Encounters:  05/30/21 153 lb 12.8 oz (69.8 kg)  01/24/21 152 lb (68.9 kg)  05/27/20 158 lb (71.7 kg)     GEN: *** Well nourished, well developed in no acute distress HEENT: Normal NECK: No JVD; No carotid bruits LYMPHATICS: No lymphadenopathy CARDIAC: ***RRR, no murmurs, rubs, gallops RESPIRATORY:  Clear to auscultation without rales, wheezing or rhonchi  ABDOMEN: Soft, non-tender, non-distended MUSCULOSKELETAL:  No edema; No deformity  SKIN: Warm and dry NEUROLOGIC:  Alert and oriented x 3 PSYCHIATRIC:  Normal affect   ASSESSMENT:    No diagnosis found. PLAN:    In  order of problems listed above:  ***      {Are you ordering a CV Procedure (e.g. stress test, cath, DCCV, TEE, etc)?   Press F2        :[id number]}    Medication Adjustments/Labs and Tests Ordered: Current medicines are reviewed at length with the patient today.  Concerns regarding medicines are outlined above.  No orders of the defined types were placed in this encounter.  No orders of the defined types were placed in this encounter.   There are no Patient Instructions on file for this visit.   Signed, Finis Bud, NP  06/07/2022 5:20 AM    [place]

## 2022-06-13 DIAGNOSIS — I10 Essential (primary) hypertension: Secondary | ICD-10-CM | POA: Diagnosis not present

## 2022-06-13 DIAGNOSIS — Z79899 Other long term (current) drug therapy: Secondary | ICD-10-CM | POA: Diagnosis not present

## 2022-06-15 IMAGING — US US ABDOMEN LIMITED
1 series · 14 of 25 positions shown · non-contrast
Comparison: Abdominal ultrasound 08/11/2018

CLINICAL DATA: Right upper quadrant pain

EXAM:
ULTRASOUND ABDOMEN LIMITED RIGHT UPPER QUADRANT

[Series 1: us abdomen limited ruq (liver/gb) · 14 of 43 slices shown]
[im 1/43]
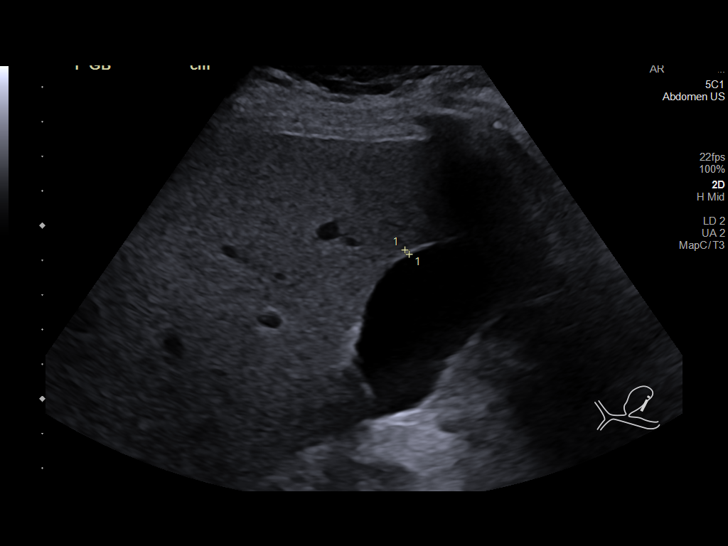
[im 4/43]
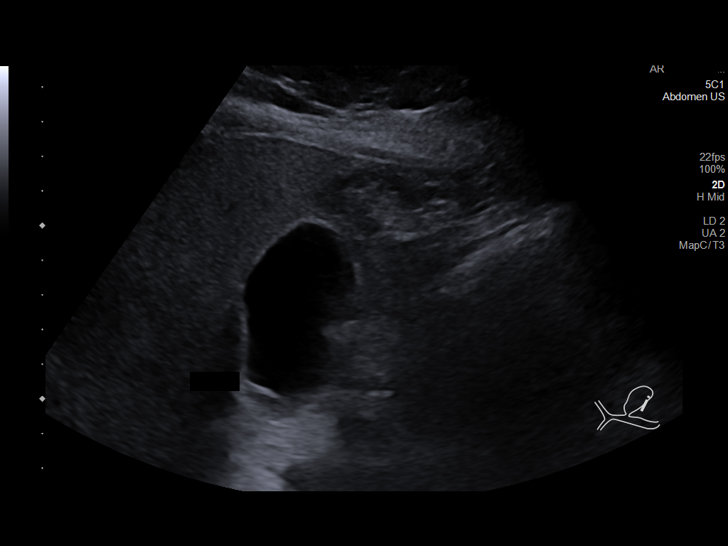
[im 8/43]
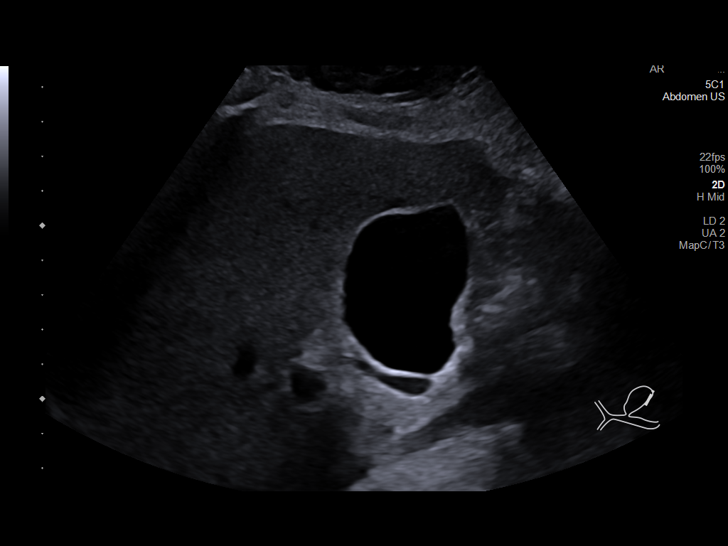
[im 11/43]
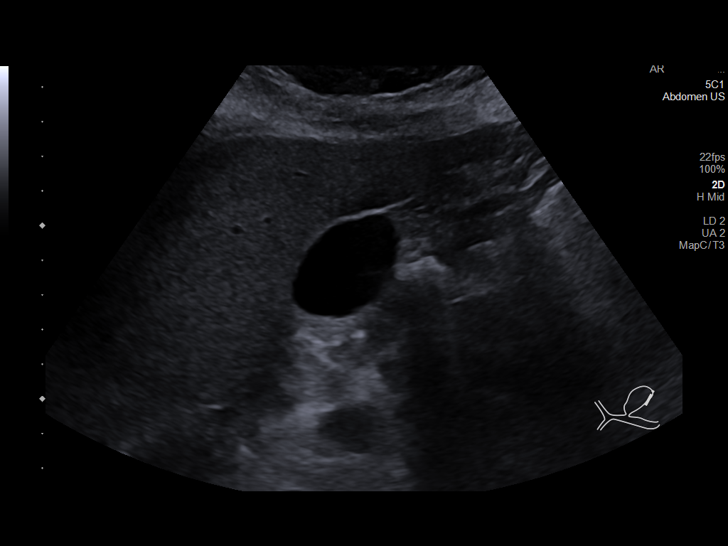
[im 15/43]
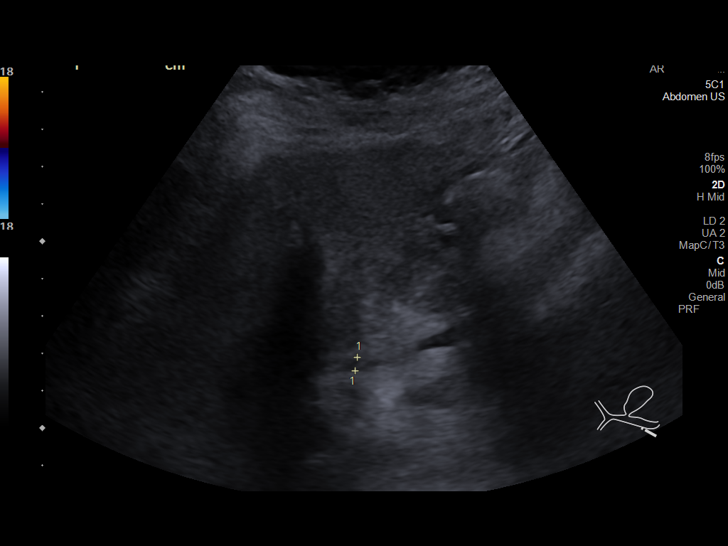
[im 16/43]
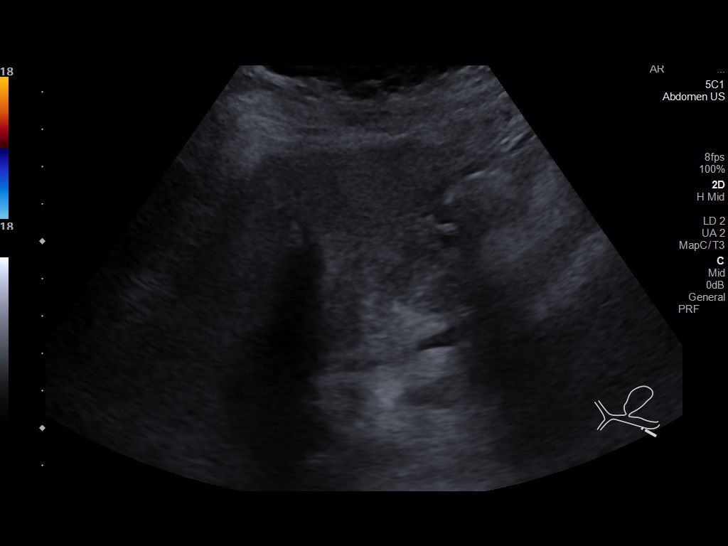
[im 20/43]
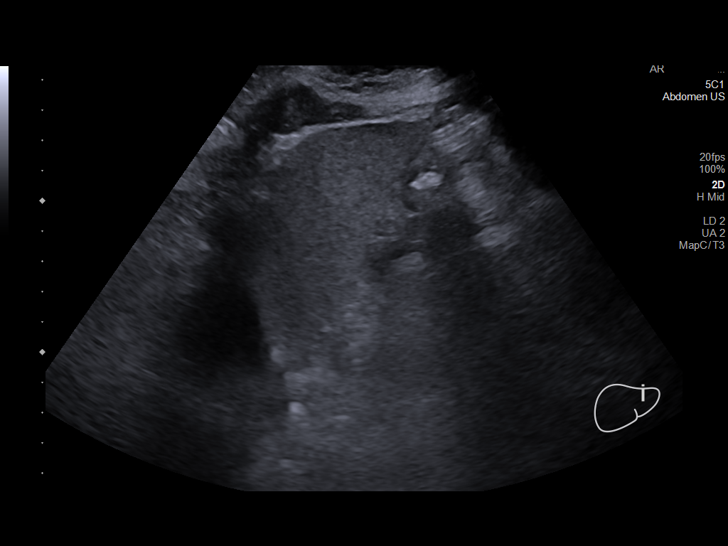
[im 23/43]
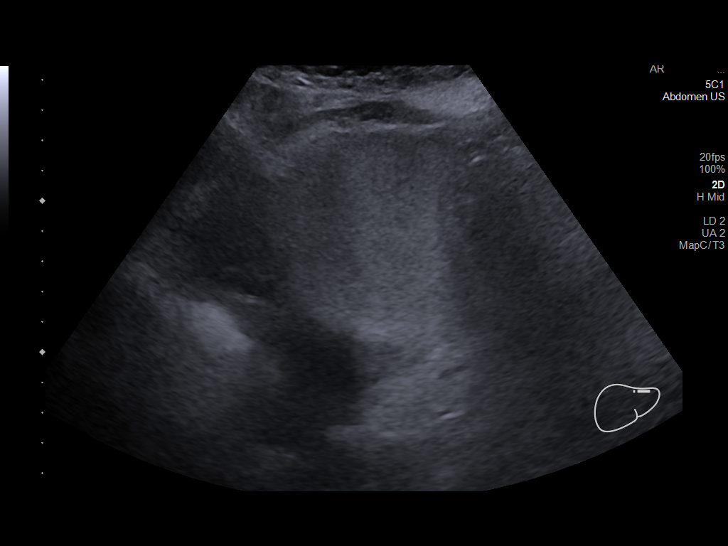
[im 27/43]
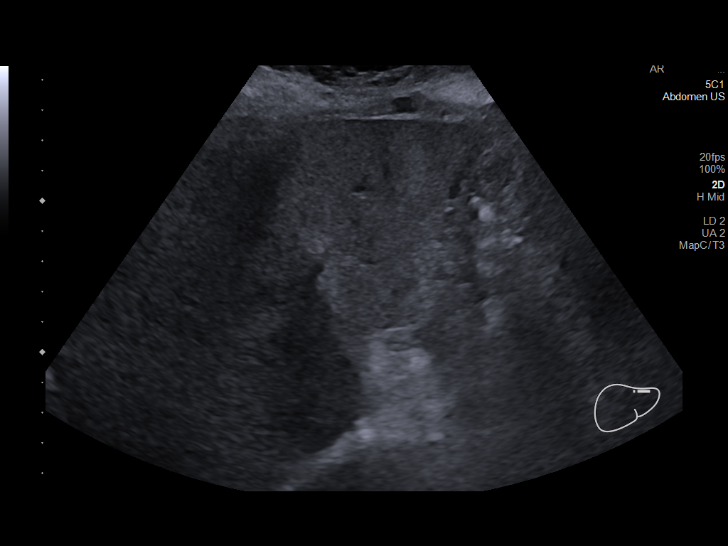
[im 29/43]
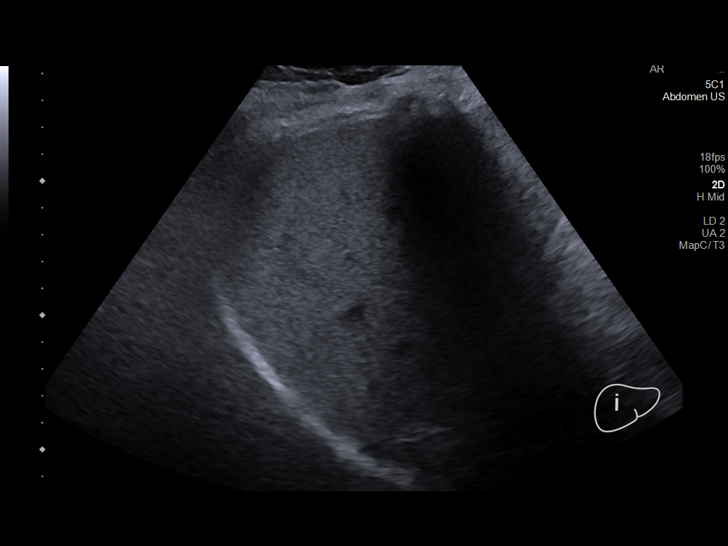
[im 32/43]
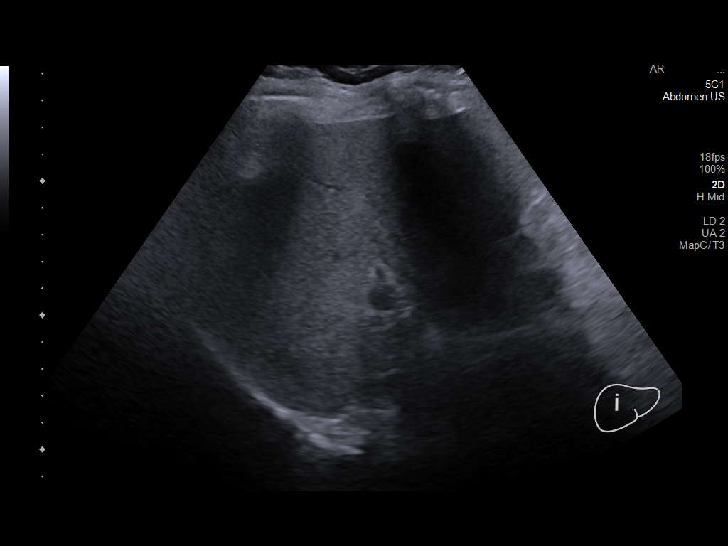
[im 36/43]
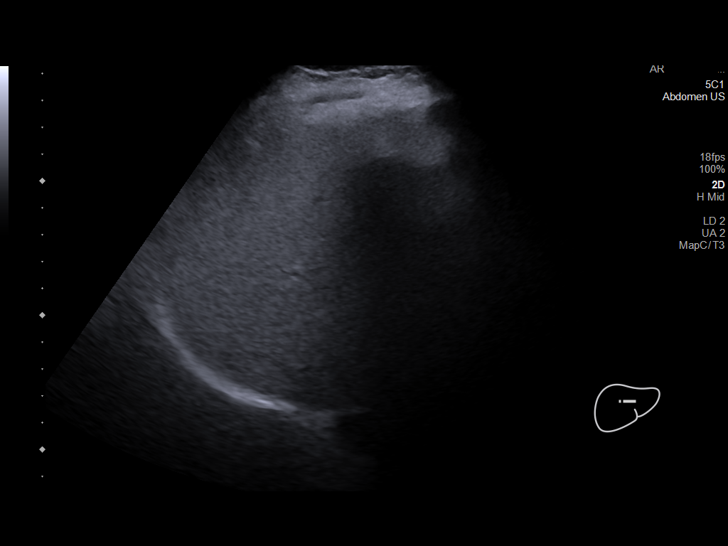
[im 39/43]
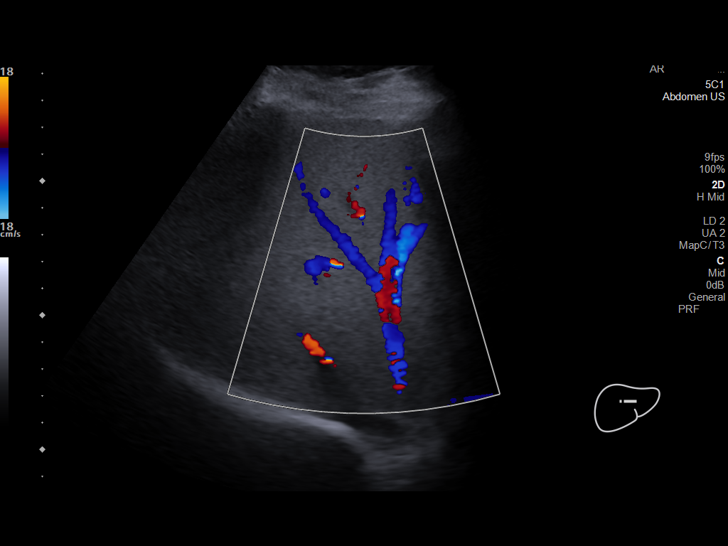
[im 43/43]
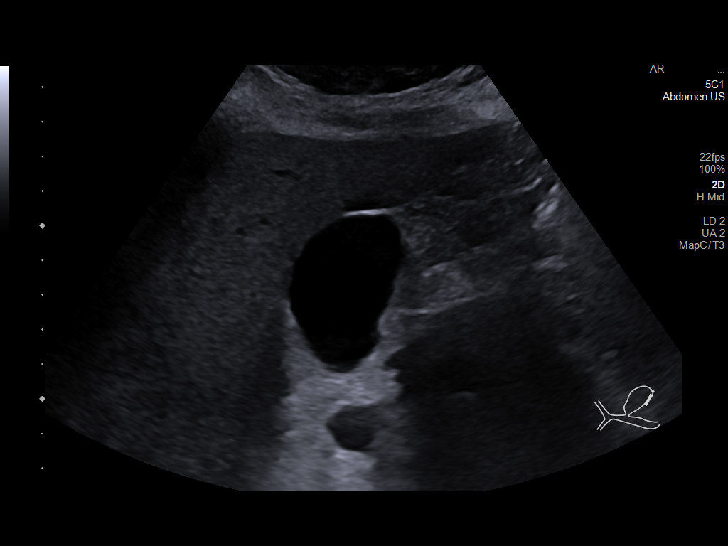

[14 of 25 positions shown; findings below may reference images not displayed]

FINDINGS: Gallbladder:

No gallstones or wall thickening visualized. No sonographic Murphy
sign noted by sonographer.

Common bile duct:

Diameter: 0.4 cm, within normal limits

Liver:

No focal lesion identified. Mildly increased liver parenchymal
echogenicity. Mildly increased portal vein is patent on color
Doppler imaging with normal direction of blood flow towards the
liver.

Other: None.
IMPRESSION: Liver parenchymal echogenicity which is nonspecific but most
commonly seen with hepatic steatosis. No other finding to explain
the patient's abdominal pain.

## 2022-06-19 ENCOUNTER — Encounter: Payer: Self-pay | Admitting: Internal Medicine

## 2022-06-20 NOTE — Progress Notes (Signed)
. [facility] Note  06/22/2022     CHIEF COMPLAINT Patient presents for Retina Follow Up  HISTORY OF PRESENT ILLNESS: Megan Schroeder is a 77 y.o. female who presents to the clinic today for:   HPI     Retina Follow Up   Patient presents with  Other.  In both eyes.  Severity is moderate.  Duration of 4 weeks.  Since onset it is stable.  I, the attending physician,  performed the HPI with the patient and updated documentation appropriately.        Comments   Patient states that she is not seeing much of a change in her vision. She is using Prolensa OD TID and Pred OD TID. Her blood sugar was 140.      Last edited by [name], MD on 06/22/2022 12:13 PM.    Pt states vision OD doesn't seem as sharp as it should be  Referring physician: [name], [place] [address]  HISTORICAL INFORMATION:   Selected notes from the MEDICAL RECORD NUMBER Referred by Dr. [name]   CURRENT MEDICATIONS: Current Outpatient Medications (Ophthalmic Drugs)  Medication Sig   Bromfenac Sodium (PROLENSA) 0.07 % SOLN PLACE 1 DROP IN THE RIGHT EYE FOUR TIMES DAILY   prednisoLONE acetate (PRED FORTE) 1 % ophthalmic suspension INSTILL 1 DROP IN THE RIGHT EYE FOUR TIMES DAILY   No current facility-administered medications for this visit. (Ophthalmic Drugs)   Current Outpatient Medications (Other)  Medication Sig   acetaminophen (TYLENOL) 325 MG tablet Take 2 tablets (650 mg total) by mouth every 6 (six) hours as needed for mild pain (or Fever >/= 101).   amLODipine (NORVASC) 10 MG tablet Take 1 tablet (10 mg total) by mouth daily. For BP   Ascorbic Acid (VITAMIN C) 1000 MG tablet Take 1,000 mg by mouth daily.   aspirin EC 81 MG tablet Take 1 tablet (81 mg total) by mouth daily with breakfast.   atorvastatin (LIPITOR) 20 MG tablet Take 1 tablet (20 mg total) by mouth every evening.   benzonatate (TESSALON) 100 MG capsule Take by mouth  3 (three) times daily.   cholecalciferol (VITAMIN D) 1000 UNITS tablet Take 1,000 Units by mouth daily.    Coenzyme Q10 (COQ-10) 200 MG CAPS Take 200 mg by mouth daily.    dapagliflozin propanediol (FARXIGA) 10 MG TABS tablet Take 1 tablet (10 mg total) by mouth daily.   doxycycline (ADOXA) 100 MG tablet Take 100 mg by mouth 2 (two) times daily. 10 days ending 05/31/22.   GNP ULTICARE PEN NEEDLES 31G X 5 MM MISC See admin instructions.   levothyroxine (SYNTHROID) 75 MCG tablet Take 1 tablet (75 mcg total) by mouth daily before breakfast.   lisinopril (ZESTRIL) 40 MG tablet Take 40 mg by mouth daily.   metFORMIN (GLUCOPHAGE) 1000 MG tablet Take 1 tablet (1,000 mg total) by mouth 2 (two) times daily with a meal.   Omega-3 Fatty Acids (FISH OIL) 1200 MG CAPS Take 1,200 mg by mouth every other day.    pantoprazole (PROTONIX) 40 MG tablet Take 1 tablet (40 mg total) by mouth daily.   TOUJEO SOLOSTAR 300 UNIT/ML SOPN Inject 50 Units into the skin at bedtime.    TRULICITY 7.35 HG/9.9ME SOPN Inject 0.75 mg into the skin once a week.   No current facility-administered medications for this visit. (Other)   REVIEW OF SYSTEMS: ROS   Positive for: Gastrointestinal, Endocrine, Cardiovascular,  Eyes Negative for: Constitutional, Neurological, Skin, Genitourinary, Musculoskeletal, HENT, Respiratory, Psychiatric, Allergic/Imm, Heme/Lymph Last edited by Annie Paras, COT on 06/22/2022  7:56 AM.      ALLERGIES Allergies  Allergen Reactions   Penicillins Hives    Has patient had a PCN reaction causing immediate rash, facial/tongue/throat swelling, SOB or lightheadedness with hypotension: No Has patient had a PCN reaction causing severe rash involving mucus membranes or skin necrosis: Yes Has patient had a PCN reaction that required hospitalization: No Has patient had a PCN reaction occurring within the last 10 years: No If all of the above answers are "NO", then may proceed with Cephalosporin  use.     Hydrocodone Nausea And Vomiting   Sulfa Antibiotics    PAST MEDICAL HISTORY Past Medical History:  Diagnosis Date   Anxiety    Diabetic retinopathy ([place])    NPDR OU   Hyperlipidemia    Hypertension    Hypertensive retinopathy    OU   Hypothyroidism    Type 2 diabetes mellitus ([place])    Past Surgical History:  Procedure Laterality Date   BREAST EXCISIONAL BIOPSY Left    CATARACT EXTRACTION W/PHACO Left 12/16/2017   Procedure: CATARACT EXTRACTION PHACO AND INTRAOCULAR LENS PLACEMENT ([place]);  Surgeon: [name], MD;  Location: [facility];  Service: Ophthalmology;  Laterality: Left;  CDE: 11.65   CATARACT EXTRACTION W/PHACO Right 12/30/2017   Procedure: CATARACT EXTRACTION PHACO AND INTRAOCULAR LENS PLACEMENT RIGHT EYE;  Surgeon: [name], MD;  Location: [facility];  Service: Ophthalmology;  Laterality: Right;  CDE: 9.07   COLONOSCOPY N/A 09/15/2015   Dr. Dudley Major multiple rectal and colonic polyps removed. Hepatic flexure with 9 mm polyp, multiple 5-7 mm polyps. Multiple cecal polyps with largest 1.5 cm, one 5 mm polyp in rectum. Path for colonic polyps with sessile serrated polyp without dysplasia, no high grade dysplasia, tubular adenomas, rectal hyperplastic polyp   COLONOSCOPY WITH PROPOFOL N/A 07/20/2019   Procedure: COLONOSCOPY WITH PROPOFOL;  Surgeon: [name], MD;  Location: [facility];  Service: Endoscopy;  Laterality: N/A;  8:30am   EYE SURGERY Bilateral 2019   Cat Sx - Dr. [name]   LEFT HEART CATHETERIZATION WITH CORONARY ANGIOGRAM N/A 07/23/2011   Procedure: LEFT HEART CATHETERIZATION WITH CORONARY ANGIOGRAM;  Surgeon: [name], MD;  Location: [facility] LAB;  Service: Cardiovascular;  Laterality: N/A;   POLYPECTOMY  07/20/2019   Procedure: POLYPECTOMY;  Surgeon: [name], MD;  [facility];  Service: Endoscopy;;   YAG LASER APPLICATION Right 69/48/5462   Dr. [name]   YAG LASER APPLICATION Left 70/35/0093   Dr. [name]   FAMILY  HISTORY Family History  Problem Relation Age of Onset   Cancer Father    Diabetes Father    Colon cancer Neg Hx    SOCIAL HISTORY Social History   Tobacco Use   Smoking status: Never   Smokeless tobacco: Never  Vaping Use   Vaping Use: Never used  Substance Use Topics   Alcohol use: No   Drug use: No       OPHTHALMIC EXAM:  Base Eye Exam     Visual Acuity (Snellen - Linear)       Right Left   Dist cc 20/30 +1 20/20   Dist ph cc NI     Correction: Glasses         Tonometry (Tonopen, 7:59 AM)       Right Left   Pressure 20 19  Pupils       Dark Light Shape React APD   Right 3 2 Round Brisk None   Left 3 2 Round Brisk None         Visual Fields       Left Right    Full Full         Extraocular Movement       Right Left    Full, Ortho Full, Ortho         Neuro/Psych     Oriented x3: Yes   Mood/Affect: Normal         Dilation     Both eyes: 1.0% Mydriacyl, 2.5% Phenylephrine @ 7:57 AM           Slit Lamp and Fundus Exam     Slit Lamp Exam       Right Left   Lids/Lashes Dermatochalasis - upper lid, mild MGD Dermatochalasis - upper lid, mild MGD   Conjunctiva/Sclera white and quiet white and quiet   Cornea 1-2+PEE, well healed temporal cataract wound 1+PEE, well healed temporal cataract wound   Anterior Chamber deep, clear, narrow temporal angle deep and clear   Iris round and dilated, No NVI round and dilated, No NVI   Lens PC IOL in good position, open PC PC IOL in good position with open PC   Anterior Vitreous syneresis, PVD syneresis, Posterior vitreous detachment, vitreous condensations         Fundus Exam       Right Left   Disc pink and sharp, compact, mild PPA pink and sharp, compact, mild PPP   C/D Ratio 0.3 0.1   Macula flat, blunted foveal reflex, ERM with striae inferiorly, punctate IRH, +central cyst / edema -- improved, focal exudate superior to fovea flat, blunted foveal reflex, mild ERM, rare  MA, no edema, focal pigment clumping temporal to fovea   Vessels attenuated, tortuous greatest IT arcades attenuated, tortuous   Periphery attached, scattered MA / DBH greatest superior to disc - improving attached, rare MA/DBH, no RT/RD 360           Refraction     Wearing Rx       Sphere Cylinder Axis Add   Right [place]   +2.50   Left -0.25 +0.50 177 +2.50           IMAGING AND PROCEDURES  Imaging and Procedures for 06/22/2022  OCT, Retina - OU - Both Eyes       Right Eye Quality was good. Central Foveal Thickness: 458. Progression has improved. Findings include no SRF, abnormal foveal contour, epiretinal membrane, intraretinal fluid, macular pucker (ERM with pucker and prominent central cyst; mild interval improvement in IRF and central cyst).   Left Eye Quality was good. Central Foveal Thickness: 263. Progression has been stable. Findings include normal foveal contour, no IRF, no SRF, epiretinal membrane, macular pucker.   Notes *Images captured and stored on drive  Diagnosis / Impression:  OD: ERM with pucker and prominent central cyst; mild interval improvement in IRF and central cyst OS: mild ERM with early pucker, partial PVD   Clinical management:  See below  Abbreviations: NFP - Normal foveal profile. CME - cystoid macular edema. PED - pigment epithelial detachment. IRF - intraretinal fluid. SRF - subretinal fluid. EZ - ellipsoid zone. ERM - epiretinal membrane. ORA - outer retinal atrophy. ORT - outer retinal tubulation. SRHM - subretinal hyper-reflective material. IRHM - intraretinal hyper-reflective material      Intravitreal Injection, Pharmacologic  Agent - OD - Right Eye       Time Out 06/22/2022. 8:47 AM. Confirmed correct patient, procedure, site, and patient consented.   Anesthesia Topical anesthesia was used. Anesthetic medications included Lidocaine 2%, Proparacaine 0.5%.   Procedure Preparation included 5% betadine to ocular surface, eyelid  speculum. A (32g) needle was used.   Injection: 1.25 mg Bevacizumab 1.'25mg'$ /0.45m   Route: Intravitreal, Site: Right Eye   NDC: [id number] Lot:: [id number] Expiration date: 08/10/2022   Post-op Post injection exam found visual acuity of at least counting fingers. The patient tolerated the procedure well. There were no complications. The patient received written and verbal post procedure care education. Post injection medications were not given.             ASSESSMENT/PLAN:    ICD-10-CM   1. Epiretinal membrane (ERM) of both eyes  H35.373 OCT, Retina - OU - Both Eyes    2. Moderate nonproliferative diabetic retinopathy of both eyes with macular edema associated with type 2 diabetes mellitus (HCC)  EG01.7494Intravitreal Injection, Pharmacologic Agent - OD - Right Eye    Bevacizumab (AVASTIN) SOLN 1.25 mg    3. Essential hypertension  I10     4. Hypertensive retinopathy of both eyes  H35.033     5. Pseudophakia of both eyes  Z96.1       1. Epiretinal membrane OU -- stable  - BCVA OD 20/30; OS 20/20  - OCT shows ERM with pucker and prominent central cyst; interval improvement in IRF and central cyst   - IRF OD: ?DME vs CME vs cystic changes  - started PF and Prolensa QID OD only for possible CME component on 12.8.22 -- continue, but suspect DME may be significant component  - no metamorphopsia  - monitor for now  - f/u 5 weeks -- DFE/OCT  2. Moderate nonproliferative diabetic retinopathy OU  - s/p IVA OD #1 (02.16.23), #2 (03.20.23), #3 (04.17.23), #4 (05.22.23), #5 (06.22.23), #6 (07.27.23),  #7 (09.01.23) #8 (10.06.23), #9 (11.09.23), #10 (12.15.23) - BCVA OD: stable at 20/30, OS: 20/20  - exam shows scattered MA, no NV - OCT shows OD: ERM with pucker and prominent central cyst; mild interval improvement in IRF and central cyst; OS: mild ERM with early pucker, partial PVD at 4 wks  - recommend IVA OD #11 today, 01.12.24 for DME component w/ f/u in 4 wks - pt wishes to  proceed - RBA of procedure discussed, questions answered - IVA informed consent obtained and signed, 02.16.23 (OD) - have obtained Eylea approval for 2023, but continuing with Avastin for now due to good response - see procedure note  - f/u in 4 wks - DFE/OCT, possible injxn  3,4. Hypertensive retinopathy OU - discussed importance of tight BP control - monitor   5. Pseudophakia OU  - s/p CE/IOL OU, 2019 (Dr. HGeoffry Paradise  - IOLs in good position, doing well  - s/p YAG cap OD 3.24.22  - s/p YAG cap OS 04.07.22  - monitor  Ophthalmic Meds Ordered this visit:  Meds ordered this encounter  Medications   Bevacizumab (AVASTIN) SOLN 1.25 mg     Return in about 4 weeks (around 07/20/2022) for f/u NPDR OU, DFE, OCT.  There are no Patient Instructions on file for this visit.  This document serves as a record of services personally performed by BGardiner Sleeper MD, PhD. It was created on their behalf by DRoselee Nova COMT. The creation of this record is the provider's dictation and/or activities  during the visit.  Electronically signed by: Roselee Nova, COMT 06/22/22 12:13 PM  This document serves as a record of services personally performed by Gardiner Sleeper, MD, PhD. It was created on their behalf by San Jetty. Owens Shark, OA an ophthalmic technician. The creation of this record is the provider's dictation and/or activities during the visit.    Electronically signed by: San Jetty. Marguerita Merles 01.12.2024 12:13 PM  Gardiner Sleeper, M.D., Ph.D. Diseases & Surgery of the Retina and Vitreous [facility]  I have reviewed the above documentation for accuracy and completeness, and I agree with the above. Gardiner Sleeper, M.D., Ph.D. 06/22/22 12:14 PM  Abbreviations: M myopia (nearsighted); A astigmatism; H hyperopia (farsighted); P presbyopia; Mrx spectacle prescription;  CTL contact lenses; OD right eye; OS left eye; OU both eyes  XT exotropia; ET esotropia; PEK punctate epithelial  keratitis; PEE punctate epithelial erosions; DES dry eye syndrome; MGD meibomian gland dysfunction; ATs artificial tears; PFAT's preservative free artificial tears; [place] nuclear sclerotic cataract; PSC posterior subcapsular cataract; ERM epi-retinal membrane; PVD posterior vitreous detachment; RD retinal detachment; DM diabetes mellitus; DR diabetic retinopathy; NPDR non-proliferative diabetic retinopathy; PDR proliferative diabetic retinopathy; CSME clinically significant macular edema; DME diabetic macular edema; dbh dot blot hemorrhages; CWS cotton wool spot; POAG primary open angle glaucoma; C/D cup-to-disc ratio; HVF humphrey visual field; GVF goldmann visual field; OCT optical coherence tomography; IOP intraocular pressure; BRVO Branch retinal vein occlusion; CRVO central retinal vein occlusion; CRAO central retinal artery occlusion; BRAO branch retinal artery occlusion; RT retinal tear; SB scleral buckle; PPV pars plana vitrectomy; VH Vitreous hemorrhage; PRP panretinal laser photocoagulation; IVK intravitreal kenalog; VMT vitreomacular traction; MH Macular hole;  NVD neovascularization of the disc; NVE neovascularization elsewhere; AREDS age related eye disease study; ARMD age related macular degeneration; POAG primary open angle glaucoma; EBMD epithelial/anterior basement membrane dystrophy; ACIOL anterior chamber intraocular lens; IOL intraocular lens; PCIOL posterior chamber intraocular lens; Phaco/IOL phacoemulsification with intraocular lens placement; [name] photorefractive keratectomy; LASIK laser assisted in situ keratomileusis; HTN hypertension; DM diabetes mellitus; COPD chronic obstructive pulmonary disease

## 2022-06-22 ENCOUNTER — Ambulatory Visit (INDEPENDENT_AMBULATORY_CARE_PROVIDER_SITE_OTHER): Payer: Medicare Other | Admitting: Ophthalmology

## 2022-06-22 ENCOUNTER — Encounter (INDEPENDENT_AMBULATORY_CARE_PROVIDER_SITE_OTHER): Payer: Self-pay | Admitting: Ophthalmology

## 2022-06-22 DIAGNOSIS — H35033 Hypertensive retinopathy, bilateral: Secondary | ICD-10-CM | POA: Diagnosis not present

## 2022-06-22 DIAGNOSIS — E113313 Type 2 diabetes mellitus with moderate nonproliferative diabetic retinopathy with macular edema, bilateral: Secondary | ICD-10-CM

## 2022-06-22 DIAGNOSIS — Z961 Presence of intraocular lens: Secondary | ICD-10-CM

## 2022-06-22 DIAGNOSIS — H35373 Puckering of macula, bilateral: Secondary | ICD-10-CM | POA: Diagnosis not present

## 2022-06-22 DIAGNOSIS — I10 Essential (primary) hypertension: Secondary | ICD-10-CM

## 2022-06-22 MED ORDER — BEVACIZUMAB CHEMO INJECTION 1.25MG/0.05ML SYRINGE FOR KALEIDOSCOPE
1.2500 mg | INTRAVITREAL | Status: AC | PRN
  Administered 2022-06-22: 1.25 mg via INTRAVITREAL

## 2022-06-29 ENCOUNTER — Ambulatory Visit: Payer: Medicare Other | Admitting: Nurse Practitioner

## 2022-06-29 NOTE — Progress Notes (Deleted)
Cardiology Office Note:    Date:  06/29/2022   ID:  Megan Schroeder, DOB 06-09-1946, MRN [id number]  PCP:  [name], [place] Prov[facility] Cardiologist:  Rozann Lesches, MD { Click to update primary MD,subspecialty MD or APP then REFRESH:1}    Referring MD: [name], FNP   No chief complaint on file. ***  History of Present Illness:    Megan Schroeder is a 77 y.o. female with a hx of ***   CAD HTN T2DM HLD Hypothyroidism  Patient is a 77 year old female with past medical history as mentioned above.  In 2021 she presented to [facility] for chest pain with associated symptoms of dizziness, palpitations, and nausea.  She denied any diaphoresis or radiation of chest pain, as well as shortness of breath.  She also noted palpitations that were bothersome.  Troponins were flat.  EKG did not show any acute ischemic changes.  NST was negative for ischemia, low risk.  Denied any recurrent chest pain at follow-up.   Last seen by Dr. Domenic Polite on May 30, 2021.  Was doing very well and denied any chest pain or shortness of breath.  She was very active and exercises 6 evenings per week, reported walking on treadmill and using stationary bicycle.  She was instructed to closely monitor her blood pressure at home.  Instructed to follow-up in 1 year.  Today she presents for 1 year follow-up.  She states.  Past Medical History:  Diagnosis Date   Anxiety    Diabetic retinopathy ([place])    NPDR OU   Hyperlipidemia    Hypertension    Hypertensive retinopathy    OU   Hypothyroidism    Type 2 diabetes mellitus ([place])     Past Surgical History:  Procedure Laterality Date   BREAST EXCISIONAL BIOPSY Left    CATARACT EXTRACTION W/PHACO Left 12/16/2017   Procedure: CATARACT EXTRACTION PHACO AND INTRAOCULAR LENS PLACEMENT ([place]);  Surgeon: [name], MD;  Location: [facility];  Service: Ophthalmology;  Laterality: Left;  CDE: 11.65   CATARACT EXTRACTION W/PHACO  Right 12/30/2017   Procedure: CATARACT EXTRACTION PHACO AND INTRAOCULAR LENS PLACEMENT RIGHT EYE;  Surgeon: [name], MD;  Location: [facility];  Service: Ophthalmology;  Laterality: Right;  CDE: 9.07   COLONOSCOPY N/A 09/15/2015   Dr. Dudley Major multiple rectal and colonic polyps removed. Hepatic flexure with 9 mm polyp, multiple 5-7 mm polyps. Multiple cecal polyps with largest 1.5 cm, one 5 mm polyp in rectum. Path for colonic polyps with sessile serrated polyp without dysplasia, no high grade dysplasia, tubular adenomas, rectal hyperplastic polyp   COLONOSCOPY WITH PROPOFOL N/A 07/20/2019   Procedure: COLONOSCOPY WITH PROPOFOL;  Surgeon: [name], MD;  [facility];  Service: Endoscopy;  Laterality: N/A;  8:30am   EYE SURGERY Bilateral 2019   Cat Sx - Dr. [name]   LEFT HEART CATHETERIZATION WITH CORONARY ANGIOGRAM N/A 07/23/2011   Procedure: LEFT HEART CATHETERIZATION WITH CORONARY ANGIOGRAM;  Surgeon: [name], MD;  Location: [facility] CATH LAB;  Service: Cardiovascular;  Laterality: N/A;   POLYPECTOMY  07/20/2019   Procedure: POLYPECTOMY;  Surgeon: [name], MD;  [facility];  Service: Endoscopy;;   YAG LASER APPLICATION Right [id number]   Dr. [name]   YAG LASER APPLICATION Left [id number]   Dr. [name]    Current Medications: No outpatient medications have been marked as taking for the 06/29/22 encounter (Appointment) with [place] Calix,  Benjamine Mola, NP.     Allergies:   Penicillins, Hydrocodone, and Sulfa antibiotics   Social History   Socioeconomic History   Marital status: Widowed    Spouse name: Not on file   Number of children: Not on file   Years of education: Not on file   Highest education level: Not on file  Occupational History   Not on file  Tobacco Use   Smoking status: Never   Smokeless tobacco: Never  Vaping Use   Vaping Use: Never used  Substance and Sexual Activity   Alcohol use: No   Drug use: No   Sexual activity: Yes     Birth control/protection: Post-menopausal  Other Topics Concern   Not on file  Social History Narrative   Not on file   Social Determinants of Health   Financial Resource Strain: Not on file  Food Insecurity: Not on file  Transportation Needs: Not on file  Physical Activity: Not on file  Stress: Not on file  Social Connections: Not on file     Family History: The patient's ***family history includes Cancer in her father; Diabetes in her father. There is no history of Colon cancer.  ROS:   Please see the history of present illness.    *** All other systems reviewed and are negative.  EKGs/Labs/Other Studies Reviewed:    The following studies were reviewed today: ***  EKG:  EKG is *** ordered today.  The ekg ordered today demonstrates ***  Recent Labs: No results found for requested labs within last 365 days.  Recent Lipid Panel    Component Value Date/Time   CHOL 147 07/24/2011 0516   TRIG 147 07/24/2011 0516   HDL 42 07/24/2011 0516   CHOLHDL 3.5 07/24/2011 0516   VLDL 29 07/24/2011 0516   LDLCALC 76 07/24/2011 0516     Risk Assessment/Calculations:   {Does this patient have ATRIAL FIBRILLATION?:[phone]}  No BP recorded.  {Refresh Note OR Click here to enter BP  :1}***         Physical Exam:    VS:  There were no vitals taken for this visit.    Wt Readings from Last 3 Encounters:  05/30/21 153 lb 12.8 oz (69.8 kg)  01/24/21 152 lb (68.9 kg)  05/27/20 158 lb (71.7 kg)     GEN: *** Well nourished, well developed in no acute distress HEENT: Normal NECK: No JVD; No carotid bruits LYMPHATICS: No lymphadenopathy CARDIAC: ***RRR, no murmurs, rubs, gallops RESPIRATORY:  Clear to auscultation without rales, wheezing or rhonchi  ABDOMEN: Soft, non-tender, non-distended MUSCULOSKELETAL:  No edema; No deformity  SKIN: Warm and dry NEUROLOGIC:  Alert and oriented x 3 PSYCHIATRIC:  Normal affect   ASSESSMENT:    No diagnosis found. PLAN:    In order  of problems listed above:  ***      {Are you ordering a CV Procedure (e.g. stress test, cath, DCCV, TEE, etc)?   Press F2        :[id number]    Medication Adjustments/Labs and Tests Ordered: Current medicines are reviewed at length with the patient today.  Concerns regarding medicines are outlined above.  No orders of the defined types were placed in this encounter.  No orders of the defined types were placed in this encounter.   There are no Patient Instructions on file for this visit.   Signed, Finis Bud, NP  06/29/2022 8:08 AM    [place]

## 2022-07-13 NOTE — Progress Notes (Signed)
. [facility] Note  07/20/2022     CHIEF COMPLAINT Patient presents for Retina Follow Up  HISTORY OF PRESENT ILLNESS: Megan Schroeder is a 77 y.o. female who presents to the clinic today for:   HPI     Retina Follow Up   Patient presents with  Diabetic Retinopathy.  In right eye.  This started 4 weeks ago.  Duration of 4 weeks.  Since onset it is stable.  I, the attending physician,  performed the HPI with the patient and updated documentation appropriately.        Comments   4 week retina follow up NPDR OU and IVA OD pt states no vision change noticed her last reading was 121 last A1C 7 3 months ago       Last edited by [name], MD on 07/20/2022  8:07 AM.     Pt states vision is stable  Referring physician: [name], [place] [id number]  HISTORICAL INFORMATION:   Selected notes from the MEDICAL RECORD NUMBER Referred by Dr. [name]   CURRENT MEDICATIONS: Current Outpatient Medications (Ophthalmic Drugs)  Medication Sig   Bromfenac Sodium (PROLENSA) 0.07 % SOLN PLACE 1 DROP IN THE RIGHT EYE FOUR TIMES DAILY   prednisoLONE acetate (PRED FORTE) 1 % ophthalmic suspension INSTILL 1 DROP IN THE RIGHT EYE FOUR TIMES DAILY   No current facility-administered medications for this visit. (Ophthalmic Drugs)   Current Outpatient Medications (Other)  Medication Sig   acetaminophen (TYLENOL) 325 MG tablet Take 2 tablets (650 mg total) by mouth every 6 (six) hours as needed for mild pain (or Fever >/= 101).   amLODipine (NORVASC) 10 MG tablet Take 1 tablet (10 mg total) by mouth daily. For BP   Ascorbic Acid (VITAMIN C) 1000 MG tablet Take 1,000 mg by mouth daily.   aspirin EC 81 MG tablet Take 1 tablet (81 mg total) by mouth daily with breakfast.   atorvastatin (LIPITOR) 20 MG tablet Take 1 tablet (20 mg total) by mouth every evening.   benzonatate (TESSALON) 100 MG capsule Take by mouth 3 (three) times daily.    cholecalciferol (VITAMIN D) 1000 UNITS tablet Take 1,000 Units by mouth daily.    Coenzyme Q10 (COQ-10) 200 MG CAPS Take 200 mg by mouth daily.    dapagliflozin propanediol (FARXIGA) 10 MG TABS tablet Take 1 tablet (10 mg total) by mouth daily.   doxycycline (ADOXA) 100 MG tablet Take 100 mg by mouth 2 (two) times daily. 10 days ending 05/31/22.   GNP ULTICARE PEN NEEDLES 31G X 5 MM MISC See admin instructions.   levothyroxine (SYNTHROID) 75 MCG tablet Take 1 tablet (75 mcg total) by mouth daily before breakfast.   lisinopril (ZESTRIL) 40 MG tablet Take 40 mg by mouth daily.   metFORMIN (GLUCOPHAGE) 1000 MG tablet Take 1 tablet (1,000 mg total) by mouth 2 (two) times daily with a meal.   Omega-3 Fatty Acids (FISH OIL) 1200 MG CAPS Take 1,200 mg by mouth every other day.    pantoprazole (PROTONIX) 40 MG tablet Take 1 tablet (40 mg total) by mouth daily.   TOUJEO SOLOSTAR 300 UNIT/ML SOPN Inject 50 Units into the skin at bedtime.    TRULICITY [id number] SOPN Inject 0.75 mg into the skin once a week.   No current facility-administered medications for this visit. (Other)   REVIEW OF SYSTEMS: ROS   Positive for: Gastrointestinal, Endocrine, Cardiovascular, Eyes Negative for:  Constitutional, Neurological, Skin, Genitourinary, Musculoskeletal, HENT, Respiratory, Psychiatric, Allergic/Imm, Heme/Lymph Last edited by Parthenia Ames, COT on 07/20/2022  7:52 AM.     ALLERGIES Allergies  Allergen Reactions   Penicillins Hives    Has patient had a PCN reaction causing immediate rash, facial/tongue/throat swelling, SOB or lightheadedness with hypotension: No Has patient had a PCN reaction causing severe rash involving mucus membranes or skin necrosis: Yes Has patient had a PCN reaction that required hospitalization: No Has patient had a PCN reaction occurring within the last 10 years: No If all of the above answers are "NO", then may proceed with Cephalosporin use.     Hydrocodone  Nausea And Vomiting   Sulfa Antibiotics    PAST MEDICAL HISTORY Past Medical History:  Diagnosis Date   Anxiety    Diabetic retinopathy ([place])    NPDR OU   Hyperlipidemia    Hypertension    Hypertensive retinopathy    OU   Hypothyroidism    Type 2 diabetes mellitus ([place])    Past Surgical History:  Procedure Laterality Date   BREAST EXCISIONAL BIOPSY Left    CATARACT EXTRACTION W/PHACO Left 12/16/2017   Procedure: CATARACT EXTRACTION PHACO AND INTRAOCULAR LENS PLACEMENT ([place]);  Surgeon: [name], MD;  Location: [facility];  Service: Ophthalmology;  Laterality: Left;  CDE: 11.65   CATARACT EXTRACTION W/PHACO Right 12/30/2017   Procedure: CATARACT EXTRACTION PHACO AND INTRAOCULAR LENS PLACEMENT RIGHT EYE;  Surgeon: [name], MD;  Location: [facility];  Service: Ophthalmology;  Laterality: Right;  CDE: 9.07   COLONOSCOPY N/A 09/15/2015   Dr. Dudley Major multiple rectal and colonic polyps removed. Hepatic flexure with 9 mm polyp, multiple 5-7 mm polyps. Multiple cecal polyps with largest 1.5 cm, one 5 mm polyp in rectum. Path for colonic polyps with sessile serrated polyp without dysplasia, no high grade dysplasia, tubular adenomas, rectal hyperplastic polyp   COLONOSCOPY WITH PROPOFOL N/A 07/20/2019   Procedure: COLONOSCOPY WITH PROPOFOL;  Surgeon: [name], MD;  Location: [facility];  Service: Endoscopy;  Laterality: N/A;  8:30am   EYE SURGERY Bilateral 2019   Cat Sx - Dr. [name]   LEFT HEART CATHETERIZATION WITH CORONARY ANGIOGRAM N/A 07/23/2011   Procedure: LEFT HEART CATHETERIZATION WITH CORONARY ANGIOGRAM;  Surgeon: [name], MD;  Location: [facility] C[facility] LAB;  Service: Cardiovascular;  Laterality: N/A;   POLYPECTOMY  07/20/2019   Procedure: POLYPECTOMY;  Surgeon: [name], MD;  Location: [facility];  Service: Endoscopy;;   YAG LASER APPLICATION Right [id number]   Dr. [name]   YAG LASER APPLICATION Left [id number]   Dr. [name]   FAMILY HISTORY Family History   Problem Relation Age of Onset   Cancer Father    Diabetes Father    Colon cancer Neg Hx    SOCIAL HISTORY Social History   Tobacco Use   Smoking status: Never   Smokeless tobacco: Never  Vaping Use   Vaping Use: Never used  Substance Use Topics   Alcohol use: No   Drug use: No       OPHTHALMIC EXAM:  Base Eye Exam     Visual Acuity (Snellen - Linear)       Right Left   Dist cc 20/30 -1 20/20   Dist ph cc NI     Correction: Glasses         Tonometry (Tonopen, 7:54 AM)       Right Left   Pressure 19 16  Pupils       Pupils Dark Light Shape React APD   Right PERRL 3 2 Round Brisk None   Left PERRL 3 2 Round Brisk None         Visual Fields       Left Right    Full Full         Extraocular Movement       Right Left    Full, Ortho Full, Ortho         Neuro/Psych     Oriented x3: Yes   Mood/Affect: Normal         Dilation     Both eyes: 2.5% Phenylephrine @ 7:55 AM           Slit Lamp and Fundus Exam     Slit Lamp Exam       Right Left   Lids/Lashes Dermatochalasis - upper lid, mild MGD Dermatochalasis - upper lid, mild MGD   Conjunctiva/Sclera white and quiet white and quiet   Cornea 1-2+PEE, well healed temporal cataract wound 1+PEE, well healed temporal cataract wound   Anterior Chamber deep, clear, narrow temporal angle deep and clear   Iris round and dilated, No NVI round and dilated, No NVI   Lens PC IOL in good position, open PC PC IOL in good position with open PC   Anterior Vitreous syneresis, PVD syneresis, Posterior vitreous detachment, vitreous condensations         Fundus Exam       Right Left   Disc pink and sharp, compact, mild PPA pink and sharp, compact, mild PPP   C/D Ratio 0.3 0.1   Macula flat, blunted foveal reflex, ERM with striae inferiorly, punctate IRH, +central cyst / edema -- improved, focal exudate superior to fovea -- improving flat, blunted foveal reflex, mild ERM, rare MA, no  edema, focal pigment clumping temporal to fovea   Vessels attenuated, tortuous greatest IT arcades attenuated, tortuous   Periphery attached, scattered MA / DBH greatest superior to disc - improving attached, rare MA/DBH, no RT/RD 360           Refraction     Wearing Rx       Sphere Cylinder Axis Add   Right [place]   +2.50   Left -0.25 +0.50 177 +2.50           IMAGING AND PROCEDURES  Imaging and Procedures for 07/20/2022  OCT, Retina - OU - Both Eyes       Right Eye Quality was good. Central Foveal Thickness: 445. Progression has improved. Findings include no SRF, abnormal foveal contour, epiretinal membrane, intraretinal fluid, macular pucker (ERM with pucker and prominent central cyst; mild interval improvement in IRF and central cyst).   Left Eye Quality was good. Central Foveal Thickness: 269. Progression has been stable. Findings include normal foveal contour, no IRF, no SRF, epiretinal membrane, macular pucker.   Notes *Images captured and stored on drive  Diagnosis / Impression:  OD: ERM with pucker and prominent central cyst; mild interval improvement in IRF and central cyst OS: mild ERM with early pucker, partial PVD   Clinical management:  See below  Abbreviations: NFP - Normal foveal profile. CME - cystoid macular edema. PED - pigment epithelial detachment. IRF - intraretinal fluid. SRF - subretinal fluid. EZ - ellipsoid zone. ERM - epiretinal membrane. ORA - outer retinal atrophy. ORT - outer retinal tubulation. SRHM - subretinal hyper-reflective material. IRHM - intraretinal hyper-reflective material  Intravitreal Injection, Pharmacologic Agent - OD - Right Eye       Time Out 07/20/2022. 7:53 AM. Confirmed correct patient, procedure, site, and patient consented.   Anesthesia Topical anesthesia was used. Anesthetic medications included Lidocaine 2%, Proparacaine 0.5%.   Procedure Preparation included 5% betadine to ocular surface, eyelid  speculum. A supplied (32g) needle was used.   Injection: 1.25 mg Bevacizumab 1.93m/0.05ml   Route: Intravitreal, Site: Right Eye   NDC:[id number] Lot: [id number]$ , Expiration date: 08/24/2022   Post-op Post injection exam found visual acuity of at least counting fingers. The patient tolerated the procedure well. There were no complications. The patient received written and verbal post procedure care education. Post injection medications were not given.            ASSESSMENT/PLAN:    ICD-10-CM   1. Epiretinal membrane (ERM) of both eyes  H35.373 OCT, Retina - OU - Both Eyes    2. Moderate nonproliferative diabetic retinopathy of both eyes with macular edema associated with type 2 diabetes mellitus (HCC)  E11.3313 OCT, Retina - OU - Both Eyes    Intravitreal Injection, Pharmacologic Agent - OD - Right Eye    Bevacizumab (AVASTIN) SOLN 1.25 mg    3. Essential hypertension  I10     4. Hypertensive retinopathy of both eyes  H35.033     5. Pseudophakia of both eyes  Z96.1      1. Epiretinal membrane OU -- stable  - BCVA OD 20/30; OS 20/20  - OCT shows OD - ERM with pucker and prominent central cyst; mild interval improvement in IRF and central cyst; OS - mild ERM w/ early pucker  - IRF OD: ?DME vs CME vs cystic changes  - started PF and Prolensa QID OD only for possible CME component on 12.8.22 -- continue, but suspect DME may be significant component  - no metamorphopsia  - monitor for now  - f/u 5 weeks -- DFE/OCT  2. Moderate nonproliferative diabetic retinopathy OU  - s/p IVA OD #1 (02.16.23), #2 (03.20.23), #3 (04.17.23), #4 (05.22.23), #5 (06.22.23), #6 (07.27.23),  #7 (09.01.23) #8 (10.06.23), #9 (11.09.23), #10 (12.15.23) #11(01.12.24) - BCVA OD: stable at 20/30, OS: 20/20  - exam shows scattered MA, no NV - OCT shows OD: ERM with pucker and prominent central cyst; mild interval improvement in IRF and central cyst; OS: mild ERM with early pucker, partial PVD at 5 wks   - recommend IVA OD #12 today, 02.09.24 for DME component w/ f/u in 5 wks - pt wishes to proceed - RBA of procedure discussed, questions answered - IVA informed consent obtained and signed, 02.16.23 (OD) - have obtained Eylea approval for 2023, but continuing with Avastin for now due to good response - see procedure note  - f/u in 5 wks - DFE/OCT, possible injxn  3,4. Hypertensive retinopathy OU - discussed importance of tight BP control - monitor   5. Pseudophakia OU  - s/p CE/IOL OU, 2019 (Dr. H2020  - IOLs in good position, doing well  - s/p YAG cap OD 3.24.22  - s/p YAG cap OS 04.07.22  - monitor  Ophthalmic Meds Ordered this visit:  Meds ordered this encounter  Medications   Bevacizumab (AVASTIN) SOLN 1.25 mg     Return in about 5 weeks (around 08/24/2022) for f/u NPDR OU, DFE, OCT.  There are no Patient Instructions on file for this visit.   Electronically signed by: [id number] 07/13/2022 2:44 AM  This document serves as  a record of services personally performed by Gardiner Sleeper, MD, PhD. It was created on their behalf by San Jetty. Owens Shark, OA an ophthalmic technician. The creation of this record is the provider's dictation and/or activities during the visit.    Electronically signed by: San Jetty. Owens Shark, [place] 02.09.2024 2:44 AM  Gardiner Sleeper, M.D., Ph.D. Diseases & Surgery of the Retina and [place] 07/20/2022   I have reviewed the above documentation for accuracy and completeness, and I agree with the above. Gardiner Sleeper, M.D., Ph.D. 07/21/22 2:46 AM   Abbreviations: M myopia (nearsighted); A astigmatism; H hyperopia (farsighted); P presbyopia; Mrx spectacle prescription;  CTL contact lenses; OD right eye; OS left eye; OU both eyes  XT exotropia; ET esotropia; PEK punctate epithelial keratitis; PEE punctate epithelial erosions; DES dry eye syndrome; MGD meibomian gland dysfunction; ATs artificial tears; PFAT's  preservative free artificial tears; [place] nuclear sclerotic cataract; PSC posterior subcapsular cataract; ERM epi-retinal membrane; PVD posterior vitreous detachment; RD retinal detachment; DM diabetes mellitus; DR diabetic retinopathy; NPDR non-proliferative diabetic retinopathy; PDR proliferative diabetic retinopathy; CSME clinically significant macular edema; DME diabetic macular edema; dbh dot blot hemorrhages; CWS cotton wool spot; POAG primary open angle glaucoma; C/D cup-to-disc ratio; HVF humphrey visual field; GVF goldmann visual field; OCT optical coherence tomography; IOP intraocular pressure; BRVO Branch retinal vein occlusion; CRVO central retinal vein occlusion; CRAO central retinal artery occlusion; BRAO branch retinal artery occlusion; RT retinal tear; SB scleral buckle; PPV pars plana vitrectomy; VH Vitreous hemorrhage; PRP panretinal laser photocoagulation; IVK intravitreal kenalog; VMT vitreomacular traction; MH Macular hole;  NVD neovascularization of the disc; NVE neovascularization elsewhere; AREDS age related eye disease study; ARMD age related macular degeneration; POAG primary open angle glaucoma; EBMD epithelial/anterior basement membrane dystrophy; ACIOL anterior chamber intraocular lens; IOL intraocular lens; PCIOL posterior chamber intraocular lens; Phaco/IOL phacoemulsification with intraocular lens placement; [place] photorefractive keratectomy; LASIK laser assisted in situ keratomileusis; HTN hypertension; DM diabetes mellitus; COPD chronic obstructive pulmonary disease

## 2022-07-20 ENCOUNTER — Encounter (INDEPENDENT_AMBULATORY_CARE_PROVIDER_SITE_OTHER): Payer: Self-pay | Admitting: Ophthalmology

## 2022-07-20 ENCOUNTER — Ambulatory Visit (INDEPENDENT_AMBULATORY_CARE_PROVIDER_SITE_OTHER): Payer: Medicare Other | Admitting: Ophthalmology

## 2022-07-20 DIAGNOSIS — H35373 Puckering of macula, bilateral: Secondary | ICD-10-CM | POA: Diagnosis not present

## 2022-07-20 DIAGNOSIS — I10 Essential (primary) hypertension: Secondary | ICD-10-CM | POA: Diagnosis not present

## 2022-07-20 DIAGNOSIS — E113313 Type 2 diabetes mellitus with moderate nonproliferative diabetic retinopathy with macular edema, bilateral: Secondary | ICD-10-CM

## 2022-07-20 DIAGNOSIS — Z961 Presence of intraocular lens: Secondary | ICD-10-CM | POA: Diagnosis not present

## 2022-07-20 DIAGNOSIS — H35033 Hypertensive retinopathy, bilateral: Secondary | ICD-10-CM

## 2022-07-20 DIAGNOSIS — H26493 Other secondary cataract, bilateral: Secondary | ICD-10-CM

## 2022-07-20 DIAGNOSIS — E113393 Type 2 diabetes mellitus with moderate nonproliferative diabetic retinopathy without macular edema, bilateral: Secondary | ICD-10-CM

## 2022-07-20 MED ORDER — BEVACIZUMAB CHEMO INJECTION 1.25MG/0.05ML SYRINGE FOR KALEIDOSCOPE
1.2500 mg | INTRAVITREAL | Status: AC | PRN
  Administered 2022-07-20: 1.25 mg via INTRAVITREAL

## 2022-07-27 ENCOUNTER — Encounter: Payer: Self-pay | Admitting: Nurse Practitioner

## 2022-07-27 ENCOUNTER — Ambulatory Visit: Payer: Medicare Other | Attending: Nurse Practitioner | Admitting: Nurse Practitioner

## 2022-07-27 VITALS — BP 156/80 | HR 89 | Ht 63.0 in | Wt 154.4 lb

## 2022-07-27 DIAGNOSIS — I2510 Atherosclerotic heart disease of native coronary artery without angina pectoris: Secondary | ICD-10-CM | POA: Diagnosis not present

## 2022-07-27 DIAGNOSIS — I10 Essential (primary) hypertension: Secondary | ICD-10-CM

## 2022-07-27 DIAGNOSIS — E782 Mixed hyperlipidemia: Secondary | ICD-10-CM

## 2022-07-27 NOTE — Patient Instructions (Signed)

## 2022-07-27 NOTE — Progress Notes (Signed)
Cardiology Office Note:    Date:  07/27/2022   ID:  Megan Schroeder, DOB 01-21-1946, MRN [id number]  PCP:  [name], [facility] Cardiologist:  Rozann Lesches, MD     Referring MD: [name], FNP   CC: Here for 1 year follow-up  History of Present Illness:    Megan Schroeder is a 77 y.o. female with a hx of the following:   CAD HTN T2DM HLD Hypothyroidism  Patient is a very delightful 77 year old female with past medical history as mentioned above. She has known hx of CAD. LHC in 2013 revealed 70% dLAD stenosis.   In 2021 she presented to [facility] for chest pain with associated symptoms of dizziness, palpitations, and nausea.  She denied any diaphoresis or radiation of chest pain, as well as shortness of breath.  She also noted palpitations that were bothersome.  Troponins were flat.  EKG did not show any acute ischemic changes.  NST was negative for ischemia, low risk.  Denied any recurrent chest pain at follow-up.   Last seen by Dr. Domenic Polite on May 30, 2021.  Was doing very well and denied any chest pain or shortness of breath.  She was very active and exercises 6 evenings per week, reported walking on treadmill and using stationary bicycle.  She was instructed to closely monitor her blood pressure at home.  Instructed to follow-up in 1 year.  Today she presents for 1 year follow-up.  She states she is doing very well. Denies any chest pain, shortness of breath, palpitations, syncope, presyncope, dizziness, orthopnea, PND, swelling or significant weight changes, acute bleeding, or claudication. Exercises every day and eats healthy.   SH: Enjoys reading, spending time with her grandchildren, and attending [employer]  Past Medical History:  Diagnosis Date   Anxiety    Diabetic retinopathy ([place])    NPDR OU   Hyperlipidemia    Hypertension    Hypertensive retinopathy    OU   Hypothyroidism    Type 2  diabetes mellitus ([place])     Past Surgical History:  Procedure Laterality Date   BREAST EXCISIONAL BIOPSY Left    CATARACT EXTRACTION W/PHACO Left 12/16/2017   Procedure: CATARACT EXTRACTION PHACO AND INTRAOCULAR LENS PLACEMENT ([place]);  Surgeon: [name], MD;  Location: [facility];  Service: Ophthalmology;  Laterality: Left;  CDE: 11.65   CATARACT EXTRACTION W/PHACO Right 12/30/2017   Procedure: CATARACT EXTRACTION PHACO AND INTRAOCULAR LENS PLACEMENT RIGHT EYE;  Surgeon: [name], MD;  Location: [facility];  Service: Ophthalmology;  Laterality: Right;  CDE: 9.07   COLONOSCOPY N/A 09/15/2015   Dr. Dudley Major multiple rectal and colonic polyps removed. Hepatic flexure with 9 mm polyp, multiple 5-7 mm polyps. Multiple cecal polyps with largest 1.5 cm, one 5 mm polyp in rectum. Path for colonic polyps with sessile serrated polyp without dysplasia, no high grade dysplasia, tubular adenomas, rectal hyperplastic polyp   COLONOSCOPY WITH PROPOFOL N/A 07/20/2019   Procedure: COLONOSCOPY WITH PROPOFOL;  Surgeon: [name], MD;  Location: [facility];  Service: Endoscopy;  Laterality: N/A;  8:30am   EYE SURGERY Bilateral 2019   Cat Sx - Dr. [name]   LEFT HEART CATHETERIZATION WITH CORONARY ANGIOGRAM N/A 07/23/2011   Procedure: LEFT HEART CATHETERIZATION WITH CORONARY ANGIOGRAM;  Surgeon: [name], MD;  Location: [facility] CATH LAB;  Service: Cardiovascular;  Laterality: N/A;   POLYPECTOMY  07/20/2019   Procedure: POLYPECTOMY;  Surgeon:  [name], MD;  Location: [facility];  Service: Endoscopy;;   YAG LASER APPLICATION Right [id number]   Dr. [name]   YAG LASER APPLICATION Left [id number]   Dr. [name]    Current Medications: Current Meds  Medication Sig   acetaminophen (TYLENOL) 325 MG tablet Take 2 tablets (650 mg total) by mouth every 6 (six) hours as needed for mild pain (or Fever >/= 101).   amLODipine (NORVASC) 10 MG tablet Take 1 tablet (10 mg total) by mouth daily. For BP    Ascorbic Acid (VITAMIN C) 1000 MG tablet Take 1,000 mg by mouth daily.   aspirin EC 81 MG tablet Take 1 tablet (81 mg total) by mouth daily with breakfast.   atorvastatin (LIPITOR) 20 MG tablet Take 1 tablet (20 mg total) by mouth every evening.   benzonatate (TESSALON) 100 MG capsule Take by mouth 3 (three) times daily.   Bromfenac Sodium (PROLENSA) 0.07 % SOLN PLACE 1 DROP IN THE RIGHT EYE FOUR TIMES DAILY   cholecalciferol (VITAMIN D) 1000 UNITS tablet Take 1,000 Units by mouth daily.    Coenzyme Q10 (COQ-10) 200 MG CAPS Take 200 mg by mouth daily.    dapagliflozin propanediol (FARXIGA) 10 MG TABS tablet Take 1 tablet (10 mg total) by mouth daily.   doxycycline (ADOXA) 100 MG tablet Take 100 mg by mouth 2 (two) times daily. 10 days ending 05/31/22.   GNP ULTICARE PEN NEEDLES 31G X 5 MM MISC See admin instructions.   hydrochlorothiazide (HYDRODIURIL) 12.5 MG tablet Take 12.5 mg by mouth daily.   levothyroxine (SYNTHROID) 75 MCG tablet Take 1 tablet (75 mcg total) by mouth daily before breakfast.   lisinopril (ZESTRIL) 40 MG tablet Take 40 mg by mouth daily.   metFORMIN (GLUCOPHAGE) 1000 MG tablet Take 1 tablet (1,000 mg total) by mouth 2 (two) times daily with a meal.   Omega-3 Fatty Acids (FISH OIL) 1200 MG CAPS Take 1,200 mg by mouth every other day.    pantoprazole (PROTONIX) 40 MG tablet Take 1 tablet (40 mg total) by mouth daily.   prednisoLONE acetate (PRED FORTE) 1 % ophthalmic suspension INSTILL 1 DROP IN THE RIGHT EYE FOUR TIMES DAILY   TOUJEO SOLOSTAR 300 UNIT/ML SOPN Inject 50 Units into the skin at bedtime.    TRULICITY [id number] [id number] SOPN Inject 0.75 mg into the skin once a week.     Allergies:   Penicillins, Hydrocodone, and Sulfa antibiotics   Social History   Socioeconomic History   Marital status: Widowed    Spouse name: Not on file   Number of children: Not on file   Years of education: Not on file   Highest education level: Not on file  Occupational History   Not  on file  Tobacco Use   Smoking status: Never   Smokeless tobacco: Never  Vaping Use   Vaping Use: Never used  Substance and Sexual Activity   Alcohol use: No   Drug use: No   Sexual activity: Yes    Birth control/protection: Post-menopausal  Other Topics Concern   Not on file  Social History Narrative   Not on file   Social Determinants of Health   Financial Resource Strain: Not on file  Food Insecurity: Not on file  Transportation Needs: Not on file  Physical Activity: Not on file  Stress: Not on file  Social Connections: Not on file     Family History: The patient's family history includes Cancer in her father; Diabetes  in her father. There is no history of Colon cancer.  ROS:   Please see the history of present illness.     All other systems reviewed and are negative.  EKGs/Labs/Other Studies Reviewed:    The following studies were reviewed today:   EKG:  EKG is  ordered today.  The ekg ordered today demonstrates NSR, 85 bpm, nothing acute.    Lexiscan on 05/23/2020:  There was no ST segment deviation noted during stress. The study is normal. There are no perfusion defects consistent with prior infarct or current ischemia. This is a low risk study. The left ventricular ejection fraction is hyperdynamic (>65%).  Echocardiogram on 05/17/2020:  1. Left ventricular ejection fraction, by estimation, is 65 to 70%. The  left ventricle has normal function. The left ventricle has no regional  wall motion abnormalities. There is mild left ventricular hypertrophy.  Left ventricular diastolic parameters  are indeterminate.   2. Right ventricular systolic function is normal. The right ventricular  size is normal. Tricuspid regurgitation signal is inadequate for assessing  PA pressure.   3. The mitral valve is grossly normal. Trivial mitral valve  regurgitation.   4. The aortic valve is tricuspid. Aortic valve regurgitation is not  visualized. Mild aortic valve  sclerosis is present, with no evidence of  aortic valve stenosis.   5. The inferior vena cava is normal in size with greater than 50%  respiratory variability, suggesting right atrial pressure of 3 mmHg.  Recent Labs: No results found for requested labs within last 365 days.  Recent Lipid Panel    Component Value Date/Time   CHOL 147 07/24/2011 0516   TRIG 147 07/24/2011 0516   HDL 42 07/24/2011 0516   CHOLHDL 3.5 07/24/2011 0516   VLDL 29 07/24/2011 0516   LDLCALC 76 07/24/2011 0516     Risk Assessment/Calculations:    HYPERTENSION CONTROL Vitals:   07/27/22 0925 07/27/22 0941  BP: 130/70 (!) 156/80    The patient's blood pressure is elevated above target today.  In order to address the patient's elevated BP: Blood pressure will be monitored at home to determine if medication changes need to be made.; Follow up with general cardiology has been recommended.            Physical Exam:    VS:  BP (!) 156/80   Pulse 89   Ht 5' 3"$  (1.6 m)   Wt 154 lb 6.4 oz (70 kg)   SpO2 99%   BMI 27.35 kg/m     Wt Readings from Last 3 Encounters:  07/27/22 154 lb 6.4 oz (70 kg)  05/30/21 153 lb 12.8 oz (69.8 kg)  01/24/21 152 lb (68.9 kg)     GEN: Well nourished, well developed in no acute distress HEENT: Normal NECK: No JVD; No carotid bruits CARDIAC: S1/S2, RRR, no murmurs, rubs, gallops; 2+ pulses RESPIRATORY:  Clear to auscultation without rales, wheezing or rhonchi  MUSCULOSKELETAL:  No edema; No deformity  SKIN: Warm and dry NEUROLOGIC:  Alert and oriented x 3 PSYCHIATRIC:  Normal affect   ASSESSMENT:    1. Coronary artery disease involving native coronary artery of native heart without angina pectoris   2. Essential hypertension   3. Mixed hyperlipidemia    PLAN:    In order of problems listed above:  CAD Stable with no anginal symptoms. No indication for ischemic evaluation.  Continue aspirin, atorvastatin, and lisinopril. Heart healthy diet and regular  cardiovascular exercise encouraged.   HTN Hx of  [place]. Improved repeat BP to 130/70. Continue current medication regimen. Discussed to monitor BP at home at least 2 hours after medications and sitting for 5-10 minutes. Heart healthy diet and regular cardiovascular exercise encouraged.   HLD LDL 84 in 2023. Continue current medication regimen. Heart healthy diet and regular cardiovascular exercise encouraged.     4. Disposition: Follow-up with Dr. Domenic Polite or me in 1 year or sooner if anything changes.   Medication Adjustments/Labs and Tests Ordered: Current medicines are reviewed at length with the patient today.  Concerns regarding medicines are outlined above.  Orders Placed This Encounter  Procedures   EKG 12-Lead   No orders of the defined types were placed in this encounter.   Patient Instructions  Medication Instructions:  Continue all current medications.   Labwork: none  Testing/Procedures: none  Follow-Up: Your physician wants you to follow up in:  1 year.  You should receive a call from the office when due.  If you don't receive this call, please call our office to schedule the follow up appointment    Any Other Special Instructions Will Be Listed Below (If Applicable).   If you need a refill on your cardiac medications before your next appointment, please call your pharmacy.    Signed, Finis Bud, NP  07/27/2022 2:25 PM    [place]

## 2022-08-07 DIAGNOSIS — B351 Tinea unguium: Secondary | ICD-10-CM | POA: Diagnosis not present

## 2022-08-07 DIAGNOSIS — L84 Corns and callosities: Secondary | ICD-10-CM | POA: Diagnosis not present

## 2022-08-07 DIAGNOSIS — E1142 Type 2 diabetes mellitus with diabetic polyneuropathy: Secondary | ICD-10-CM | POA: Diagnosis not present

## 2022-08-07 DIAGNOSIS — M79676 Pain in unspecified toe(s): Secondary | ICD-10-CM | POA: Diagnosis not present

## 2022-08-16 NOTE — Progress Notes (Signed)
. [facility] Note  08/24/2022     CHIEF COMPLAINT Patient presents for Retina Follow Up  HISTORY OF PRESENT ILLNESS: Megan Schroeder is a 77 y.o. female who presents to the clinic today for:   HPI     Retina Follow Up   Patient presents with  Other.  In both eyes.  Severity is moderate.  Duration of 5 weeks.  Since onset it is stable.  I, the attending physician,  performed the HPI with the patient and updated documentation appropriately.        Comments   Pt here for 5 wk ret f/u for ERM OU. Pt states VA the same, maybe a bit better OD.       Last edited by [name], MD on 08/24/2022 12:53 PM.      Patient feels that the vision in the right eye is not as good as the left.   Referring physician: [name], [place] [address]  HISTORICAL INFORMATION:   Selected notes from the MEDICAL RECORD NUMBER Referred by Dr. [name]   CURRENT MEDICATIONS: Current Outpatient Medications (Ophthalmic Drugs)  Medication Sig   Bromfenac Sodium (PROLENSA) 0.07 % SOLN PLACE 1 DROP IN THE RIGHT EYE FOUR TIMES DAILY   prednisoLONE acetate (PRED FORTE) 1 % ophthalmic suspension INSTILL 1 DROP IN THE RIGHT EYE FOUR TIMES DAILY   No current facility-administered medications for this visit. (Ophthalmic Drugs)   Current Outpatient Medications (Other)  Medication Sig   acetaminophen (TYLENOL) 325 MG tablet Take 2 tablets (650 mg total) by mouth every 6 (six) hours as needed for mild pain (or Fever >/= 101).   amLODipine (NORVASC) 10 MG tablet Take 1 tablet (10 mg total) by mouth daily. For BP   Ascorbic Acid (VITAMIN C) 1000 MG tablet Take 1,000 mg by mouth daily.   aspirin EC 81 MG tablet Take 1 tablet (81 mg total) by mouth daily with breakfast.   atorvastatin (LIPITOR) 20 MG tablet Take 1 tablet (20 mg total) by mouth every evening.   benzonatate (TESSALON) 100 MG capsule Take by mouth 3 (three) times daily.    cholecalciferol (VITAMIN D) 1000 UNITS tablet Take 1,000 Units by mouth daily.    Coenzyme Q10 (COQ-10) 200 MG CAPS Take 200 mg by mouth daily.    dapagliflozin propanediol (FARXIGA) 10 MG TABS tablet Take 1 tablet (10 mg total) by mouth daily.   doxycycline (ADOXA) 100 MG tablet Take 100 mg by mouth 2 (two) times daily. 10 days ending 05/31/22.   GNP ULTICARE PEN NEEDLES 31G X 5 MM MISC See admin instructions.   hydrochlorothiazide (HYDRODIURIL) 12.5 MG tablet Take 12.5 mg by mouth daily.   levothyroxine (SYNTHROID) 75 MCG tablet Take 1 tablet (75 mcg total) by mouth daily before breakfast.   lisinopril (ZESTRIL) 40 MG tablet Take 40 mg by mouth daily.   metFORMIN (GLUCOPHAGE) 1000 MG tablet Take 1 tablet (1,000 mg total) by mouth 2 (two) times daily with a meal.   Omega-3 Fatty Acids (FISH OIL) 1200 MG CAPS Take 1,200 mg by mouth every other day.    pantoprazole (PROTONIX) 40 MG tablet Take 1 tablet (40 mg total) by mouth daily.   TOUJEO SOLOSTAR 300 UNIT/ML SOPN Inject 50 Units into the skin at bedtime.    TRULICITY 3.38 SN/0.5LZ SOPN Inject 0.75 mg into the skin once a week.   No current facility-administered medications for this visit. (Other)  REVIEW OF SYSTEMS: ROS   Positive for: Gastrointestinal, Endocrine, Cardiovascular, Eyes Negative for: Constitutional, Neurological, Skin, Genitourinary, Musculoskeletal, HENT, Respiratory, Psychiatric, Allergic/Imm, Heme/Lymph Last edited by Kingsley Spittle, COT on 08/24/2022  8:17 AM.      ALLERGIES Allergies  Allergen Reactions   Penicillins Hives    Has patient had a PCN reaction causing immediate rash, facial/tongue/throat swelling, SOB or lightheadedness with hypotension: No Has patient had a PCN reaction causing severe rash involving mucus membranes or skin necrosis: Yes Has patient had a PCN reaction that required hospitalization: No Has patient had a PCN reaction occurring within the last 10 years: No If all of the above  answers are "NO", then may proceed with Cephalosporin use.     Hydrocodone Nausea And Vomiting   Sulfa Antibiotics    PAST MEDICAL HISTORY Past Medical History:  Diagnosis Date   Anxiety    Diabetic retinopathy ([place])    NPDR OU   Hyperlipidemia    Hypertension    Hypertensive retinopathy    OU   Hypothyroidism    Type 2 diabetes mellitus ([place])    Past Surgical History:  Procedure Laterality Date   BREAST EXCISIONAL BIOPSY Left    CATARACT EXTRACTION W/PHACO Left 12/16/2017   Procedure: CATARACT EXTRACTION PHACO AND INTRAOCULAR LENS PLACEMENT ([place]);  Surgeon: [name], MD;  Location: [facility];  Service: Ophthalmology;  Laterality: Left;  CDE: 11.65   CATARACT EXTRACTION W/PHACO Right 12/30/2017   Procedure: CATARACT EXTRACTION PHACO AND INTRAOCULAR LENS PLACEMENT RIGHT EYE;  Surgeon: [name], MD;  Location: [facility];  Service: Ophthalmology;  Laterality: Right;  CDE: 9.07   COLONOSCOPY N/A 09/15/2015   Dr. Dudley Major multiple rectal and colonic polyps removed. Hepatic flexure with 9 mm polyp, multiple 5-7 mm polyps. Multiple cecal polyps with largest 1.5 cm, one 5 mm polyp in rectum. Path for colonic polyps with sessile serrated polyp without dysplasia, no high grade dysplasia, tubular adenomas, rectal hyperplastic polyp   COLONOSCOPY WITH PROPOFOL N/A 07/20/2019   Procedure: COLONOSCOPY WITH PROPOFOL;  Surgeon: [name], MD;  Location: [facility];  Service: Endoscopy;  Laterality: N/A;  8:30am   EYE SURGERY Bilateral 2019   Cat Sx - Dr. [name]   LEFT HEART CATHETERIZATION WITH CORONARY ANGIOGRAM N/A 07/23/2011   Procedure: LEFT HEART CATHETERIZATION WITH CORONARY ANGIOGRAM;  Surgeon: [name], MD;  Location: [facility] CATH LAB;  Service: Cardiovascular;  Laterality: N/A;   POLYPECTOMY  07/20/2019   Procedure: POLYPECTOMY;  Surgeon: [name], MD;  Location: [facility];  Service: Endoscopy;;   YAG LASER APPLICATION Right 32/95/1884   Dr. [name]   YAG LASER  APPLICATION Left 16/60/6301   Dr. [name]   FAMILY HISTORY Family History  Problem Relation Age of Onset   Cancer Father    Diabetes Father    Colon cancer Neg Hx    SOCIAL HISTORY Social History   Tobacco Use   Smoking status: Never   Smokeless tobacco: Never  Vaping Use   Vaping Use: Never used  Substance Use Topics   Alcohol use: No   Drug use: No       OPHTHALMIC EXAM:  Base Eye Exam     Visual Acuity (Snellen - Linear)       Right Left   Dist [place] 20/30 -2 20/20   Dist ph [place] NI     Correction: Glasses         Tonometry (Tonopen, 8:20 AM)  Right Left   Pressure 18 18         Pupils       Pupils Dark Light Shape React APD   Right PERRL 3 2 Round Brisk None   Left PERRL 3 2 Round Brisk None         Visual Fields (Counting fingers)       Left Right    Full Full         Extraocular Movement       Right Left    Full, Ortho Full, Ortho         Neuro/Psych     Oriented x3: Yes   Mood/Affect: Normal         Dilation     Both eyes: 1.0% Mydriacyl, 2.5% Phenylephrine @ 8:21 AM           Slit Lamp and Fundus Exam     Slit Lamp Exam       Right Left   Lids/Lashes Dermatochalasis - upper lid, mild MGD Dermatochalasis - upper lid, mild MGD   Conjunctiva/Sclera white and quiet white and quiet   Cornea 1-2+PEE, well healed temporal cataract wound 1+PEE, well healed temporal cataract wound   Anterior Chamber deep, clear, narrow temporal angle deep and clear   Iris round and dilated, No NVI round and dilated, No NVI   Lens PC IOL in good position, open PC PC IOL in good position with open PC   Anterior Vitreous syneresis, PVD syneresis, Posterior vitreous detachment, vitreous condensations         Fundus Exam       Right Left   Disc pink and sharp, compact, mild PPA pink and sharp, compact, mild PPP   C/D Ratio 0.3 0.1   Macula flat, blunted foveal reflex, ERM with striae inferiorly, punctate IRH, +central cyst /  edema, no exudates flat, blunted foveal reflex, mild ERM, rare MA, no edema, focal pigment clumping temporal to fovea   Vessels attenuated, tortuous greatest IT arcades attenuated, tortuous   Periphery attached, scattered MA / DBH greatest inferior to disc - improving attached, rare MA/DBH, no RT/RD 360           Refraction     Wearing Rx       Sphere Cylinder Axis Add   Right [place]   +2.50   Left -0.25 +0.50 177 +2.50           IMAGING AND PROCEDURES  Imaging and Procedures for 08/24/2022  OCT, Retina - OU - Both Eyes       Right Eye Quality was good. Central Foveal Thickness: 423. Progression has been stable. Findings include no SRF, abnormal foveal contour, epiretinal membrane, intraretinal fluid, macular pucker (ERM with pucker and prominent central cyst; persistent IRF and central cyst).   Left Eye Quality was good. Central Foveal Thickness: 269. Progression has been stable. Findings include normal foveal contour, no IRF, no SRF, epiretinal membrane, macular pucker.   Notes *Images captured and stored on drive  Diagnosis / Impression:  OD: ERM with pucker and prominent central cyst; persistent IRF and central cyst OS: mild ERM with early pucker, partial PVD   Clinical management:  See below  Abbreviations: NFP - Normal foveal profile. CME - cystoid macular edema. PED - pigment epithelial detachment. IRF - intraretinal fluid. SRF - subretinal fluid. EZ - ellipsoid zone. ERM - epiretinal membrane. ORA - outer retinal atrophy. ORT - outer retinal tubulation. SRHM - subretinal hyper-reflective material. IRHM - intraretinal  hyper-reflective material      Intravitreal Injection, Pharmacologic Agent - OD - Right Eye       Time Out 08/24/2022. 8:48 AM. Confirmed correct patient, procedure, site, and patient consented.   Anesthesia Topical anesthesia was used. Anesthetic medications included Lidocaine 2%, Proparacaine 0.5%.   Procedure Preparation included 5%  betadine to ocular surface, eyelid speculum. A supplied (32g) needle was used.   Injection: 1.25 mg Bevacizumab 1.25mg /0.75ml   Route: Intravitreal, Site: Right Eye   NDC: H061816, Lot: [id number], Expiration date: 10/06/2022   Post-op Post injection exam found visual acuity of at least counting fingers. The patient tolerated the procedure well. There were no complications. The patient received written and verbal post procedure care education. Post injection medications were not given.            ASSESSMENT/PLAN:    ICD-10-CM   1. Epiretinal membrane (ERM) of both eyes  H35.373 OCT, Retina - OU - Both Eyes    2. Moderate nonproliferative diabetic retinopathy of both eyes with macular edema associated with type 2 diabetes mellitus (HCC)  E11.3313 OCT, Retina - OU - Both Eyes    Intravitreal Injection, Pharmacologic Agent - OD - Right Eye    Bevacizumab (AVASTIN) SOLN 1.25 mg    3. Essential hypertension  I10     4. Hypertensive retinopathy of both eyes  H35.033     5. Pseudophakia of both eyes  Z96.1      1. Epiretinal membrane OU -- stable  - BCVA OD 20/30; OS 20/20 - OCT shows OD - ERM with pucker and prominent central cyst; mild interval improvement in IRF and central cyst; OS - mild ERM w/ early pucker  - IRF OD: ?DME vs CME vs cystic changes - started PF and Prolensa QID OD only for possible CME component on 12.8.22 -- continue, but suspect DME may be significant component  - no metamorphopsia  - monitor for now  - f/u 5 weeks -- DFE/OCT  2. Moderate nonproliferative diabetic retinopathy OU - s/p IVA OD #1 (02.16.23), #2 (03.20.23), #3 (04.17.23), #4 (05.22.23), #5 (06.22.23), #6 (07.27.23),  #7 (09.01.23) #8 (10.06.23), #9 (11.09.23), #10 (12.15.23) #11 (01.12.24), #12 (02.09.24) - BCVA OD: stable at 20/30, OS: 20/20  - exam shows scattered MA, no NV - OCT shows OD: ERM with pucker and prominent central cyst; persistent  IRF and central cyst OS: mild ERM with early  pucker, partial PVD - recommend IVA OD #13 today, 03.15.24 for DME component w/ f/u in 5 wks - pt wishes to proceed - RBA of procedure discussed, questions answered - IVA informed consent obtained and signed, 02.16.23 (OD) - have obtained Eylea approval for 2023, but continuing with Avastin for now due to good response - see procedure note  - f/u in 5 wks - DFE/OCT, possible injxn  3,4. Hypertensive retinopathy OU - discussed importance of tight BP control - monitor   5. Pseudophakia OU  - s/p CE/IOL OU, 2019 (Dr. Geoffry Paradise)  - IOLs in good position, doing well  - s/p YAG cap OD 3.24.22  - s/p YAG cap OS 04.07.22  - monitor  Ophthalmic Meds Ordered this visit:  Meds ordered this encounter  Medications   Bevacizumab (AVASTIN) SOLN 1.25 mg     Return in about 5 weeks (around 09/28/2022) for f/u NPDE OU , DFE, OCT, Possible, IVA, OD.  There are no Patient Instructions on file for this visit.  This document serves as a record of services personally performed  by Gardiner Sleeper, MD, PhD. It was created on their behalf by Joetta Manners COT, an ophthalmic technician. The creation of this record is the provider's dictation and/or activities during the visit.    Electronically signed by: Joetta Manners COT 03.07.2024 3:26 AM  This document serves as a record of services personally performed by Gardiner Sleeper, MD, PhD. It was created on their behalf by Renaldo Reel, [place] an ophthalmic technician. The creation of this record is the provider's dictation and/or activities during the visit.    Electronically signed by:  Renaldo Reel, COT  03.15.24 3:26 AM  Gardiner Sleeper, M.D., Ph.D. Diseases & Surgery of the Retina and [place] 08/24/2022   I have reviewed the above documentation for accuracy and completeness, and I agree with the above. Gardiner Sleeper, M.D., Ph.D. 08/25/22 3:28 AM   Abbreviations: M myopia (nearsighted); A  astigmatism; H hyperopia (farsighted); P presbyopia; Mrx spectacle prescription;  CTL contact lenses; OD right eye; OS left eye; OU both eyes  XT exotropia; ET esotropia; PEK punctate epithelial keratitis; PEE punctate epithelial erosions; DES dry eye syndrome; MGD meibomian gland dysfunction; ATs artificial tears; PFAT's preservative free artificial tears; [place] nuclear sclerotic cataract; PSC posterior subcapsular cataract; ERM epi-retinal membrane; PVD posterior vitreous detachment; RD retinal detachment; DM diabetes mellitus; DR diabetic retinopathy; NPDR non-proliferative diabetic retinopathy; PDR proliferative diabetic retinopathy; CSME clinically significant macular edema; DME diabetic macular edema; dbh dot blot hemorrhages; CWS cotton wool spot; POAG primary open angle glaucoma; C/D cup-to-disc ratio; HVF humphrey visual field; GVF goldmann visual field; OCT optical coherence tomography; IOP intraocular pressure; BRVO Branch retinal vein occlusion; CRVO central retinal vein occlusion; CRAO central retinal artery occlusion; BRAO branch retinal artery occlusion; RT retinal tear; SB scleral buckle; PPV pars plana vitrectomy; VH Vitreous hemorrhage; PRP panretinal laser photocoagulation; IVK intravitreal kenalog; VMT vitreomacular traction; MH Macular hole;  NVD neovascularization of the disc; NVE neovascularization elsewhere; AREDS age related eye disease study; ARMD age related macular degeneration; POAG primary open angle glaucoma; EBMD epithelial/anterior basement membrane dystrophy; ACIOL anterior chamber intraocular lens; IOL intraocular lens; PCIOL posterior chamber intraocular lens; Phaco/IOL phacoemulsification with intraocular lens placement; [place] photorefractive keratectomy; LASIK laser assisted in situ keratomileusis; HTN hypertension; DM diabetes mellitus; COPD chronic obstructive pulmonary disease

## 2022-08-24 ENCOUNTER — Ambulatory Visit (INDEPENDENT_AMBULATORY_CARE_PROVIDER_SITE_OTHER): Payer: Medicare Other | Admitting: Ophthalmology

## 2022-08-24 ENCOUNTER — Encounter (INDEPENDENT_AMBULATORY_CARE_PROVIDER_SITE_OTHER): Payer: Self-pay | Admitting: Ophthalmology

## 2022-08-24 DIAGNOSIS — E113313 Type 2 diabetes mellitus with moderate nonproliferative diabetic retinopathy with macular edema, bilateral: Secondary | ICD-10-CM

## 2022-08-24 DIAGNOSIS — I10 Essential (primary) hypertension: Secondary | ICD-10-CM

## 2022-08-24 DIAGNOSIS — Z961 Presence of intraocular lens: Secondary | ICD-10-CM

## 2022-08-24 DIAGNOSIS — H35373 Puckering of macula, bilateral: Secondary | ICD-10-CM | POA: Diagnosis not present

## 2022-08-24 DIAGNOSIS — H35033 Hypertensive retinopathy, bilateral: Secondary | ICD-10-CM

## 2022-08-24 MED ORDER — BEVACIZUMAB CHEMO INJECTION 1.25MG/0.05ML SYRINGE FOR KALEIDOSCOPE
1.2500 mg | INTRAVITREAL | Status: AC | PRN
  Administered 2022-08-24: 1.25 mg via INTRAVITREAL

## 2022-09-05 DIAGNOSIS — E785 Hyperlipidemia, unspecified: Secondary | ICD-10-CM | POA: Diagnosis not present

## 2022-09-05 DIAGNOSIS — E039 Hypothyroidism, unspecified: Secondary | ICD-10-CM | POA: Diagnosis not present

## 2022-09-05 DIAGNOSIS — E1165 Type 2 diabetes mellitus with hyperglycemia: Secondary | ICD-10-CM | POA: Diagnosis not present

## 2022-09-12 DIAGNOSIS — Z0000 Encounter for general adult medical examination without abnormal findings: Secondary | ICD-10-CM | POA: Diagnosis not present

## 2022-09-13 DIAGNOSIS — I700 Atherosclerosis of aorta: Secondary | ICD-10-CM | POA: Diagnosis not present

## 2022-09-13 DIAGNOSIS — I10 Essential (primary) hypertension: Secondary | ICD-10-CM | POA: Diagnosis not present

## 2022-09-13 DIAGNOSIS — Z7985 Long-term (current) use of injectable non-insulin antidiabetic drugs: Secondary | ICD-10-CM | POA: Diagnosis not present

## 2022-09-13 DIAGNOSIS — Z79899 Other long term (current) drug therapy: Secondary | ICD-10-CM | POA: Diagnosis not present

## 2022-09-13 DIAGNOSIS — E785 Hyperlipidemia, unspecified: Secondary | ICD-10-CM | POA: Diagnosis not present

## 2022-09-13 DIAGNOSIS — E039 Hypothyroidism, unspecified: Secondary | ICD-10-CM | POA: Diagnosis not present

## 2022-09-13 DIAGNOSIS — R809 Proteinuria, unspecified: Secondary | ICD-10-CM | POA: Diagnosis not present

## 2022-09-13 DIAGNOSIS — Z7984 Long term (current) use of oral hypoglycemic drugs: Secondary | ICD-10-CM | POA: Diagnosis not present

## 2022-09-13 DIAGNOSIS — Z713 Dietary counseling and surveillance: Secondary | ICD-10-CM | POA: Diagnosis not present

## 2022-09-13 DIAGNOSIS — E1165 Type 2 diabetes mellitus with hyperglycemia: Secondary | ICD-10-CM | POA: Diagnosis not present

## 2022-09-17 ENCOUNTER — Encounter: Payer: Self-pay | Admitting: *Deleted

## 2022-09-17 ENCOUNTER — Encounter (INDEPENDENT_AMBULATORY_CARE_PROVIDER_SITE_OTHER): Payer: Self-pay | Admitting: *Deleted

## 2022-09-20 DIAGNOSIS — H35341 Macular cyst, hole, or pseudohole, right eye: Secondary | ICD-10-CM | POA: Diagnosis not present

## 2022-09-20 DIAGNOSIS — H35373 Puckering of macula, bilateral: Secondary | ICD-10-CM | POA: Diagnosis not present

## 2022-09-20 DIAGNOSIS — E113292 Type 2 diabetes mellitus with mild nonproliferative diabetic retinopathy without macular edema, left eye: Secondary | ICD-10-CM | POA: Diagnosis not present

## 2022-09-20 DIAGNOSIS — H5203 Hypermetropia, bilateral: Secondary | ICD-10-CM | POA: Diagnosis not present

## 2022-09-20 DIAGNOSIS — H52223 Regular astigmatism, bilateral: Secondary | ICD-10-CM | POA: Diagnosis not present

## 2022-09-27 NOTE — Progress Notes (Signed)
. [facility] - Clinic Note  09/28/2022     CHIEF COMPLAINT Patient presents for Retina Follow Up  HISTORY OF PRESENT ILLNESS: Megan Schroeder is a 77 y.o. female who presents to the clinic today for:   HPI     Retina Follow Up   Patient presents with  Diabetic Retinopathy (ERM).  In both eyes.  Severity is moderate.  Duration of 5 weeks.  Since onset it is stable.  I, the attending physician,  performed the HPI with the patient and updated documentation appropriately.        Comments   Patient states vision the same OU. Using Pred Forte and prolensa tid OD. Difficult for patient to remember 4th dose of drops. BS was 130 this am. Last A1c was 7.1, checked last month.      Last edited by Rennis Chris, MD on 09/28/2022  3:37 PM.     Patient states vision is stable, her A1c is 7.1, she had an eye exam recently and did not have to get new glasses  Referring physician: Gwyndolyn Kaufman, FNP [address] [place],  [place] [address]  HISTORICAL INFORMATION:   Selected notes from the MEDICAL RECORD NUMBER Referred by Dr. Laural Benes   CURRENT MEDICATIONS: Current Outpatient Medications (Ophthalmic Drugs)  Medication Sig   Bromfenac Sodium (PROLENSA) 0.07 % SOLN PLACE 1 DROP IN THE RIGHT EYE FOUR TIMES DAILY (Patient taking differently: Place 1 drop into the right eye 3 (three) times daily. PLACE 1 DROP IN THE RIGHT EYE FOUR TIMES DAILY)   prednisoLONE acetate (PRED FORTE) 1 % ophthalmic suspension INSTILL 1 DROP IN THE RIGHT EYE FOUR TIMES DAILY (Patient taking differently: Place 1 drop into the right eye 3 (three) times daily.)   No current facility-administered medications for this visit. (Ophthalmic Drugs)   Current Outpatient Medications (Other)  Medication Sig   acetaminophen (TYLENOL) 325 MG tablet Take 2 tablets (650 mg total) by mouth every 6 (six) hours as needed for mild pain (or Fever >/= 101).   amLODipine (NORVASC) 10 MG tablet Take 1 tablet (10  mg total) by mouth daily. For BP   Ascorbic Acid (VITAMIN C) 1000 MG tablet Take 1,000 mg by mouth daily.   aspirin EC 81 MG tablet Take 1 tablet (81 mg total) by mouth daily with breakfast.   atorvastatin (LIPITOR) 20 MG tablet Take 1 tablet (20 mg total) by mouth every evening.   benzonatate (TESSALON) 100 MG capsule Take by mouth 3 (three) times daily.   cholecalciferol (VITAMIN D) 1000 UNITS tablet Take 1,000 Units by mouth daily.    Coenzyme Q10 (COQ-10) 200 MG CAPS Take 200 mg by mouth daily.    dapagliflozin propanediol (FARXIGA) 10 MG TABS tablet Take 1 tablet (10 mg total) by mouth daily.   doxycycline (ADOXA) 100 MG tablet Take 100 mg by mouth 2 (two) times daily. 10 days ending 05/31/22.   GNP ULTICARE PEN NEEDLES 31G X 5 MM MISC See admin instructions.   hydrochlorothiazide (HYDRODIURIL) 12.5 MG tablet Take 12.5 mg by mouth daily.   levothyroxine (SYNTHROID) 75 MCG tablet Take 1 tablet (75 mcg total) by mouth daily before breakfast.   lisinopril (ZESTRIL) 40 MG tablet Take 40 mg by mouth daily.   metFORMIN (GLUCOPHAGE) 1000 MG tablet Take 1 tablet (1,000 mg total) by mouth 2 (two) times daily with a meal.   Omega-3 Fatty Acids (FISH OIL) 1200 MG CAPS Take 1,200 mg by mouth every other day.  pantoprazole (PROTONIX) 40 MG tablet Take 1 tablet (40 mg total) by mouth daily.   TOUJEO SOLOSTAR 300 UNIT/ML SOPN Inject 50 Units into the skin at bedtime.    TRULICITY 0.75 MG/0.5ML SOPN Inject 0.75 mg into the skin once a week.   No current facility-administered medications for this visit. (Other)   REVIEW OF SYSTEMS: ROS   Positive for: Gastrointestinal, Endocrine, Cardiovascular, Eyes Negative for: Constitutional, Neurological, Skin, Genitourinary, Musculoskeletal, HENT, Respiratory, Psychiatric, Allergic/Imm, Heme/Lymph Last edited by Doreene Nest, COT on 09/28/2022  8:15 AM.     ALLERGIES Allergies  Allergen Reactions   Penicillins Hives    Has patient had a PCN reaction  causing immediate rash, facial/tongue/throat swelling, SOB or lightheadedness with hypotension: No Has patient had a PCN reaction causing severe rash involving mucus membranes or skin necrosis: Yes Has patient had a PCN reaction that required hospitalization: No Has patient had a PCN reaction occurring within the last 10 years: No If all of the above answers are "NO", then may proceed with Cephalosporin use.     Hydrocodone Nausea And Vomiting   Sulfa Antibiotics    PAST MEDICAL HISTORY Past Medical History:  Diagnosis Date   Anxiety    Diabetic retinopathy    NPDR OU   Hyperlipidemia    Hypertension    Hypertensive retinopathy    OU   Hypothyroidism    Type 2 diabetes mellitus    Past Surgical History:  Procedure Laterality Date   BREAST EXCISIONAL BIOPSY Left    CATARACT EXTRACTION W/PHACO Left 12/16/2017   Procedure: CATARACT EXTRACTION PHACO AND INTRAOCULAR LENS PLACEMENT (IOC);  Surgeon: Gemma Payor, MD;  Location: [facility];  Service: Ophthalmology;  Laterality: Left;  CDE: 11.65   CATARACT EXTRACTION W/PHACO Right 12/30/2017   Procedure: CATARACT EXTRACTION PHACO AND INTRAOCULAR LENS PLACEMENT RIGHT EYE;  Surgeon: Gemma Payor, MD;  Location: [facility];  Service: Ophthalmology;  Laterality: Right;  CDE: 9.07   COLONOSCOPY N/A 09/15/2015   Dr. Geni Bers multiple rectal and colonic polyps removed. Hepatic flexure with 9 mm polyp, multiple 5-7 mm polyps. Multiple cecal polyps with largest 1.5 cm, one 5 mm polyp in rectum. Path for colonic polyps with sessile serrated polyp without dysplasia, no high grade dysplasia, tubular adenomas, rectal hyperplastic polyp   COLONOSCOPY WITH PROPOFOL N/A 07/20/2019   Procedure: COLONOSCOPY WITH PROPOFOL;  Surgeon: Corbin Ade, MD;  Location: [facility];  Service: Endoscopy;  Laterality: N/A;  8:30am   EYE SURGERY Bilateral 2019   Cat Sx - Dr. Gemma Payor   LEFT HEART CATHETERIZATION WITH CORONARY ANGIOGRAM N/A 07/23/2011   Procedure: LEFT HEART  CATHETERIZATION WITH CORONARY ANGIOGRAM;  Surgeon: Wendall Stade, MD;  Location: [facility] CATH LAB;  Service: Cardiovascular;  Laterality: N/A;   POLYPECTOMY  07/20/2019   Procedure: POLYPECTOMY;  Surgeon: Corbin Ade, MD;  Location: [facility];  Service: Endoscopy;;   YAG LASER APPLICATION Right 09/01/2020   Dr. Rennis Chris   YAG LASER APPLICATION Left 09/01/2020   Dr. Rennis Chris   FAMILY HISTORY Family History  Problem Relation Age of Onset   Cancer Father    Diabetes Father    Colon cancer Neg Hx    SOCIAL HISTORY Social History   Tobacco Use   Smoking status: Never   Smokeless tobacco: Never  Vaping Use   Vaping Use: Never used  Substance Use Topics   Alcohol use: No   Drug use: No       OPHTHALMIC EXAM:  Base Eye Exam     Visual Acuity (Snellen - Linear)       Right Left   Dist cc 20/30 20/20 -1   Dist ph cc NI          Tonometry (Tonopen, 8:22 AM)       Right Left   Pressure 21 17         Pupils       Dark Light Shape React APD   Right 3 2 Round Brisk None   Left 3 2 Round Brisk None         Visual Fields (Counting fingers)       Left Right    Full Full         Extraocular Movement       Right Left    Full, Ortho Full, Ortho         Neuro/Psych     Oriented x3: Yes   Mood/Affect: Normal         Dilation     Both eyes: 1.0% Mydriacyl, 2.5% Phenylephrine @ 8:22 AM           Slit Lamp and Fundus Exam     Slit Lamp Exam       Right Left   Lids/Lashes Dermatochalasis - upper lid, mild MGD Dermatochalasis - upper lid, mild MGD   Conjunctiva/Sclera white and quiet white and quiet   Cornea 1-2+PEE, well healed temporal cataract wound 1+PEE, well healed temporal cataract wound   Anterior Chamber deep, clear, narrow temporal angle deep and clear   Iris round and dilated, No NVI round and dilated, No NVI   Lens PC IOL in good position, open PC PC IOL in good position with open PC   Anterior Vitreous syneresis, PVD  syneresis, Posterior vitreous detachment, vitreous condensations         Fundus Exam       Right Left   Disc pink and sharp, compact, mild PPA pink and sharp, compact, mild PPP   C/D Ratio 0.3 0.1   Macula flat, blunted foveal reflex, ERM with striae inferiorly, punctate IRH, +central cyst / edema, no exudates flat, blunted foveal reflex, mild ERM, rare MA, no edema, focal pigment clumping temporal to fovea   Vessels attenuated, tortuous greatest IT arcades attenuated, tortuous   Periphery attached, scattered MA / DBH attached, rare MA/DBH, no RT/RD 360           Refraction     Wearing Rx       Sphere Cylinder Axis Add   Right [place]   +2.50   Left -0.25 +0.50 177 +2.50           IMAGING AND PROCEDURES  Imaging and Procedures for 09/28/2022  OCT, Retina - OU - Both Eyes       Right Eye Quality was good. Central Foveal Thickness: 477. Progression has worsened. Findings include no SRF, abnormal foveal contour, epiretinal membrane, intraretinal fluid, macular pucker (ERM with pucker stable; persistent IRF and central cyst -- slightly increased).   Left Eye Quality was good. Central Foveal Thickness: 269. Progression has been stable. Findings include normal foveal contour, no IRF, no SRF, epiretinal membrane, macular pucker.   Notes *Images captured and stored on drive  Diagnosis / Impression:  OD: ERM with pucker stable; persistent IRF and central cyst -- slightly increased OS: mild ERM with early pucker, partial PVD  Clinical management:  See below  Abbreviations: NFP - Normal foveal profile. CME - cystoid  macular edema. PED - pigment epithelial detachment. IRF - intraretinal fluid. SRF - subretinal fluid. EZ - ellipsoid zone. ERM - epiretinal membrane. ORA - outer retinal atrophy. ORT - outer retinal tubulation. SRHM - subretinal hyper-reflective material. IRHM - intraretinal hyper-reflective material      Intravitreal Injection, Pharmacologic Agent - OD -  Right Eye       Time Out 09/28/2022. 8:11 AM. Confirmed correct patient, procedure, site, and patient consented.   Anesthesia Topical anesthesia was used. Anesthetic medications included Lidocaine 2%, Proparacaine 0.5%.   Procedure Preparation included 5% betadine to ocular surface, eyelid speculum. A (32g) needle was used.   Injection: 2 mg aflibercept 2 MG/0.05ML   Route: Intravitreal, Site: Right Eye   NDC: L6038910, Lot: [id number], Expiration date: 10/09/2023, Waste: 0 mL   Post-op Post injection exam found visual acuity of at least counting fingers. The patient tolerated the procedure well. There were no complications. The patient received written and verbal post procedure care education. Post injection medications were not given.            ASSESSMENT/PLAN:    ICD-10-CM   1. Epiretinal membrane (ERM) of both eyes  H35.373 OCT, Retina - OU - Both Eyes    2. Moderate nonproliferative diabetic retinopathy of both eyes with macular edema associated with type 2 diabetes mellitus  E11.3313 OCT, Retina - OU - Both Eyes    Intravitreal Injection, Pharmacologic Agent - OD - Right Eye    aflibercept (EYLEA) SOLN 2 mg    3. Essential hypertension  I10     4. Hypertensive retinopathy of both eyes  H35.033     5. Pseudophakia of both eyes  Z96.1       1. Epiretinal membrane OU -- stable  - BCVA OD 20/30; OS 20/20 - OCT shows OD: ERM with pucker and prominent central cyst; persistent IRF and central cyst; OS: mild ERM with early pucker, partial PVD  -- slightly increased  - IRF OD: ?DME vs CME vs cystic changes - started PF and Prolensa QID OD only for possible CME component on 12.8.22 -- continue, but suspect DME may be significant component  - no metamorphopsia  - monitor for now  - f/u 5 weeks -- DFE/OCT  2. Moderate nonproliferative diabetic retinopathy OU - s/p IVA OD #1 (02.16.23), #2 (03.20.23), #3 (04.17.23), #4 (05.22.23), #5 (06.22.23), #6 (07.27.23),  #7  (09.01.23) #8 (10.06.23), #9 (11.09.23), #10 (12.15.23) #11 (01.12.24), #12 (02.09.24) #13 (03.15.24) - BCVA OD: stable at 20/30, OS: 20/20  - exam shows scattered MA, no NV - OCT shows OD: ERM with pucker and prominent central cyst; persistent IRF and central cyst; OS: mild ERM with early pucker, partial PVD  -- slightly increased at 5 wks - discussed IVA resistance and potential benefit with switching medication - recommend switching to IVE OD #1 today, 04.18.24 for DME component w/ f/u in 5 wks - pt wishes to proceed - RBA of procedure discussed, questions answered - IVA informed consent obtained and signed, 02.16.23 (OD) - IVE informed consent obtained and signed, 04.19.24 (OD)  - have obtained Eylea approval for 2024 - see procedure note  - f/u in 5 wks - DFE/OCT, possible injxn  3,4. Hypertensive retinopathy OU - discussed importance of tight BP control - monitor   5. Pseudophakia OU  - s/p CE/IOL OU, 2019 (Dr. Alto Denver)  - IOLs in good position, doing well  - s/p YAG cap OD 3.24.22  - s/p YAG cap OS 04.07.22  -  monitor  Ophthalmic Meds Ordered this visit:  Meds ordered this encounter  Medications   aflibercept (EYLEA) SOLN 2 mg     Return in about 5 weeks (around 11/02/2022) for f/u NPDR OU, DFE, OCT.  There are no Patient Instructions on file for this visit.  This document serves as a record of services personally performed by Karie Chimera, MD, PhD. It was created on their behalf by Berlin Hun COT, The creation of this record is the provider's dictation and/or activities during the visit.    Electronically signed by: Berlin Hun COT 09/27/2022 2:08 AM  This document serves as a record of services personally performed by Karie Chimera, MD, PhD. It was created on their behalf by Glee Arvin. Manson Passey, OA an ophthalmic technician. The creation of this record is the provider's dictation and/or activities during the visit.    Electronically signed by: Glee Arvin.  Manson Passey, [place] 04.19.2024 2:08 AM  Karie Chimera, M.D., Ph.D. Diseases & Surgery of the Retina and Vitreous [facility] 09/28/2022   I have reviewed the above documentation for accuracy and completeness, and I agree with the above. Karie Chimera, M.D., Ph.D. 10/01/22 2:10 AM  Abbreviations: M myopia (nearsighted); A astigmatism; H hyperopia (farsighted); P presbyopia; Mrx spectacle prescription;  CTL contact lenses; OD right eye; OS left eye; OU both eyes  XT exotropia; ET esotropia; PEK punctate epithelial keratitis; PEE punctate epithelial erosions; DES dry eye syndrome; MGD meibomian gland dysfunction; ATs artificial tears; PFAT's preservative free artificial tears; NSC nuclear sclerotic cataract; PSC posterior subcapsular cataract; ERM epi-retinal membrane; PVD posterior vitreous detachment; RD retinal detachment; DM diabetes mellitus; DR diabetic retinopathy; NPDR non-proliferative diabetic retinopathy; PDR proliferative diabetic retinopathy; CSME clinically significant macular edema; DME diabetic macular edema; dbh dot blot hemorrhages; CWS cotton wool spot; POAG primary open angle glaucoma; C/D cup-to-disc ratio; HVF humphrey visual field; GVF goldmann visual field; OCT optical coherence tomography; IOP intraocular pressure; BRVO Branch retinal vein occlusion; CRVO central retinal vein occlusion; CRAO central retinal artery occlusion; BRAO branch retinal artery occlusion; RT retinal tear; SB scleral buckle; PPV pars plana vitrectomy; VH Vitreous hemorrhage; PRP panretinal laser photocoagulation; IVK intravitreal kenalog; VMT vitreomacular traction; MH Macular hole;  NVD neovascularization of the disc; NVE neovascularization elsewhere; AREDS age related eye disease study; ARMD age related macular degeneration; POAG primary open angle glaucoma; EBMD epithelial/anterior basement membrane dystrophy; ACIOL anterior chamber intraocular lens; IOL intraocular lens; PCIOL posterior  chamber intraocular lens; Phaco/IOL phacoemulsification with intraocular lens placement; PRK photorefractive keratectomy; LASIK laser assisted in situ keratomileusis; HTN hypertension; DM diabetes mellitus; COPD chronic obstructive pulmonary disease

## 2022-09-28 ENCOUNTER — Encounter (INDEPENDENT_AMBULATORY_CARE_PROVIDER_SITE_OTHER): Payer: Self-pay | Admitting: Ophthalmology

## 2022-09-28 ENCOUNTER — Ambulatory Visit (INDEPENDENT_AMBULATORY_CARE_PROVIDER_SITE_OTHER): Payer: Medicare Other | Admitting: Ophthalmology

## 2022-09-28 DIAGNOSIS — E113313 Type 2 diabetes mellitus with moderate nonproliferative diabetic retinopathy with macular edema, bilateral: Secondary | ICD-10-CM

## 2022-09-28 DIAGNOSIS — I10 Essential (primary) hypertension: Secondary | ICD-10-CM

## 2022-09-28 DIAGNOSIS — H35033 Hypertensive retinopathy, bilateral: Secondary | ICD-10-CM | POA: Diagnosis not present

## 2022-09-28 DIAGNOSIS — Z961 Presence of intraocular lens: Secondary | ICD-10-CM

## 2022-09-28 DIAGNOSIS — H35373 Puckering of macula, bilateral: Secondary | ICD-10-CM

## 2022-09-28 MED ORDER — AFLIBERCEPT 2MG/0.05ML IZ SOLN FOR KALEIDOSCOPE
2.0000 mg | INTRAVITREAL | Status: AC | PRN
  Administered 2022-09-28: 2 mg via INTRAVITREAL

## 2022-10-01 ENCOUNTER — Telehealth: Payer: Self-pay | Admitting: *Deleted

## 2022-10-01 NOTE — Telephone Encounter (Signed)
Procedure: Colonoscopy  Height: 5'3 Weight: 145lbs    Have you had a colonoscopy before?  07/19/22, Dr. Jena Gauss  Do you have family history of colon cancer?  no  Do you have a family history of polyps? no  Previous colonoscopy with polyps removed? yes  Do you have a history colorectal cancer?   no  Are you diabetic?  Yes, type 2  Do you have a prosthetic or mechanical heart valve? no  Do you have a pacemaker/defibrillator?   no  Have you had endocarditis/atrial fibrillation?  no  Do you use supplemental oxygen/CPAP?  no  Have you had joint replacement within the last 12 months?  no  Do you tend to be constipated or have to use laxatives?  no   Do you have history of alcohol use? If yes, how much and how often.  no  Do you have history or are you using drugs? If yes, what do are you  using?  no  Have you ever had a stroke/heart attack?  no  Have you ever had a heart or other vascular stent placed,?no  Do you take weight loss medication? no  female patients,: have you had a hysterectomy? no                              are you post menopausal?                                do you still have your menstrual cycle? no    Date of last menstrual period?   Do you take any blood-thinning medications such as: (Plavix, aspirin, Coumadin, Aggrenox, Brilinta, Xarelto, Eliquis, Pradaxa, Savaysa or Effient)? no  If yes we need the name, milligram, dosage and who is prescribing doctor:               Current Outpatient Medications  Medication Sig Dispense Refill   acetaminophen (TYLENOL) 325 MG tablet Take 2 tablets (650 mg total) by mouth every 6 (six) hours as needed for mild pain (or Fever >/= 101). 12 tablet 0   amLODipine (NORVASC) 10 MG tablet Take 1 tablet (10 mg total) by mouth daily. For BP 90 tablet 3   Ascorbic Acid (VITAMIN C) 1000 MG tablet Take 1,000 mg by mouth daily.     aspirin EC 81 MG tablet Take 1 tablet (81 mg total) by mouth daily with breakfast. 30 tablet 11    atorvastatin (LIPITOR) 20 MG tablet Take 1 tablet (20 mg total) by mouth every evening. 90 tablet 3   benzonatate (TESSALON) 100 MG capsule Take by mouth 3 (three) times daily.     Bromfenac Sodium (PROLENSA) 0.07 % SOLN PLACE 1 DROP IN THE RIGHT EYE FOUR TIMES DAILY (Patient taking differently: Place 1 drop into the right eye 3 (three) times daily. PLACE 1 DROP IN THE RIGHT EYE FOUR TIMES DAILY) 3 mL 6   cholecalciferol (VITAMIN D) 1000 UNITS tablet Take 1,000 Units by mouth daily.      Coenzyme Q10 (COQ-10) 200 MG CAPS Take 200 mg by mouth daily.      dapagliflozin propanediol (FARXIGA) 10 MG TABS tablet Take 1 tablet (10 mg total) by mouth daily. 90 tablet 3   doxycycline (ADOXA) 100 MG tablet Take 100 mg by mouth 2 (two) times daily. 10 days ending 05/31/22.     GNP ULTICARE PEN NEEDLES  31G X 5 MM MISC See admin instructions.     hydrochlorothiazide (HYDRODIURIL) 12.5 MG tablet Take 12.5 mg by mouth daily.     levothyroxine (SYNTHROID) 75 MCG tablet Take 1 tablet (75 mcg total) by mouth daily before breakfast. 90 tablet 3   lisinopril (ZESTRIL) 40 MG tablet Take 40 mg by mouth daily.     metFORMIN (GLUCOPHAGE) 1000 MG tablet Take 1 tablet (1,000 mg total) by mouth 2 (two) times daily with a meal. 180 tablet 3   Omega-3 Fatty Acids (FISH OIL) 1200 MG CAPS Take 1,200 mg by mouth every other day.      pantoprazole (PROTONIX) 40 MG tablet Take 1 tablet (40 mg total) by mouth daily. 30 tablet 11   prednisoLONE acetate (PRED FORTE) 1 % ophthalmic suspension INSTILL 1 DROP IN THE RIGHT EYE FOUR TIMES DAILY (Patient taking differently: Place 1 drop into the right eye 3 (three) times daily.) 10 mL 2   TOUJEO SOLOSTAR 300 UNIT/ML SOPN Inject 50 Units into the skin at bedtime.      TRULICITY 0.75 MG/0.5ML SOPN Inject 0.75 mg into the skin once a week.     No current facility-administered medications for this visit.    Allergies  Allergen Reactions   Penicillins Hives    Has patient had a PCN  reaction causing immediate rash, facial/tongue/throat swelling, SOB or lightheadedness with hypotension: No Has patient had a PCN reaction causing severe rash involving mucus membranes or skin necrosis: Yes Has patient had a PCN reaction that required hospitalization: No Has patient had a PCN reaction occurring within the last 10 years: No If all of the above answers are "NO", then may proceed with Cephalosporin use.     Hydrocodone Nausea And Vomiting   Sulfa Antibiotics

## 2022-10-10 DIAGNOSIS — J069 Acute upper respiratory infection, unspecified: Secondary | ICD-10-CM | POA: Diagnosis not present

## 2022-10-14 NOTE — Telephone Encounter (Signed)
ASA 3, needs OV

## 2022-10-23 DIAGNOSIS — L84 Corns and callosities: Secondary | ICD-10-CM | POA: Diagnosis not present

## 2022-10-23 DIAGNOSIS — E1142 Type 2 diabetes mellitus with diabetic polyneuropathy: Secondary | ICD-10-CM | POA: Diagnosis not present

## 2022-10-23 DIAGNOSIS — B351 Tinea unguium: Secondary | ICD-10-CM | POA: Diagnosis not present

## 2022-10-23 DIAGNOSIS — M79676 Pain in unspecified toe(s): Secondary | ICD-10-CM | POA: Diagnosis not present

## 2022-10-25 ENCOUNTER — Ambulatory Visit (INDEPENDENT_AMBULATORY_CARE_PROVIDER_SITE_OTHER): Payer: Medicare Other | Admitting: Gastroenterology

## 2022-10-25 ENCOUNTER — Encounter: Payer: Self-pay | Admitting: Gastroenterology

## 2022-10-25 VITALS — BP 144/79 | HR 92 | Temp 97.5°F | Ht 63.0 in | Wt 152.0 lb

## 2022-10-25 DIAGNOSIS — Z86010 Personal history of colonic polyps: Secondary | ICD-10-CM | POA: Diagnosis not present

## 2022-10-25 NOTE — Patient Instructions (Signed)
We are arranging a colonoscopy with Dr. Jena Gauss in the near future.  You will stop Farxiga 72 hours before the procedure. Only take 1/2 dose of evening insulin the night before. You will hold Trulicity one week before the procedure.   Further recommendations to follow!   I enjoyed seeing you again today! I value our relationship and want to provide genuine, compassionate, and quality care. You may receive a survey regarding your visit with me, and I welcome your feedback! Thanks so much for taking the time to complete this. I look forward to seeing you again.      Gelene Mink, PhD, ANP-BC [facility]

## 2022-10-25 NOTE — Progress Notes (Signed)
Gastroenterology Office Note     Primary Care Physician:  Lupita Raider, NP  Primary Gastroenterologist: Dr. Jena Gauss    Chief Complaint   Chief Complaint  Patient presents with   Follow-up    Patient here today for a follow up office visit prior to next tcs. Patient last tcs was done 07/20/2019 done by Doctor Rourk. Patient denies any current gi issues she says pcp advised she needed to have another tcs.     History of Present Illness   Megan Schroeder is a 77 y.o. female presenting today in follow-up with a history of multiple colonic polyps over the years with last colonoscopy in 2021 noting multiple tubular adenomas. FH of colon cancer in sister.   No abdominal pain, N/V, changes in bowel habits, constipation, diarrhea, overt GI bleeding, GERD, dysphagia, unexplained weight loss, lack of appetite, unexplained weight gain.   She has no GI concerns today. She is due for surveillance colonoscopy.       Colonoscopy 2021: Diverticulosis in the entire examined colon.                           - Multiple polyps in the entire colon. Status post                            removal                           - The examination was otherwise normal on direct                            and retroflexion views. (Tubular adenomas)   Past Medical History:  Diagnosis Date   Anxiety    CAD (coronary artery disease)    Diabetic retinopathy (HCC)    NPDR OU   Hyperlipidemia    Hypertension    Hypertensive retinopathy    OU   Hypothyroidism    Type 2 diabetes mellitus (HCC)     Past Surgical History:  Procedure Laterality Date   BREAST EXCISIONAL BIOPSY Left    CATARACT EXTRACTION W/PHACO Left 12/16/2017   Procedure: CATARACT EXTRACTION PHACO AND INTRAOCULAR LENS PLACEMENT (IOC);  Surgeon: Gemma Payor, MD;  Location: [facility];  Service: Ophthalmology;  Laterality: Left;  CDE: 11.65   CATARACT EXTRACTION W/PHACO Right 12/30/2017   Procedure: CATARACT EXTRACTION PHACO  AND INTRAOCULAR LENS PLACEMENT RIGHT EYE;  Surgeon: Gemma Payor, MD;  Location: [facility];  Service: Ophthalmology;  Laterality: Right;  CDE: 9.07   COLONOSCOPY N/A 09/15/2015   Dr. Geni Bers multiple rectal and colonic polyps removed. Hepatic flexure with 9 mm polyp, multiple 5-7 mm polyps. Multiple cecal polyps with largest 1.5 cm, one 5 mm polyp in rectum. Path for colonic polyps with sessile serrated polyp without dysplasia, no high grade dysplasia, tubular adenomas, rectal hyperplastic polyp   COLONOSCOPY WITH PROPOFOL N/A 07/20/2019   multiple tubular adenomas. diverticulosis. 3 year surveillance   EYE SURGERY Bilateral 2019   Cat Sx - Dr. Gemma Payor   LEFT HEART CATHETERIZATION WITH CORONARY ANGIOGRAM N/A 07/23/2011   Procedure: LEFT HEART CATHETERIZATION WITH CORONARY ANGIOGRAM;  Surgeon: Wendall Stade, MD;  Location: [facility] CATH [facility];  Service: Cardiovascular;  Laterality: N/A;   POLYPECTOMY  07/20/2019   Procedure: POLYPECTOMY;  Surgeon: Eula Listen  M, MD;  Location: [facility];  Service: Endoscopy;;   YAG LASER APPLICATION Right 09/01/2020   Dr. Rennis Chris   YAG LASER APPLICATION Left 09/01/2020   Dr. Rennis Chris    Current Outpatient Medications  Medication Sig Dispense Refill   acetaminophen (TYLENOL) 325 MG tablet Take 2 tablets (650 mg total) by mouth every 6 (six) hours as needed for mild pain (or Fever >/= 101). 12 tablet 0   amLODipine (NORVASC) 10 MG tablet Take 1 tablet (10 mg total) by mouth daily. For BP 90 tablet 3   Ascorbic Acid (VITAMIN C) 1000 MG tablet Take 1,000 mg by mouth daily.     aspirin EC 81 MG tablet Take 1 tablet (81 mg total) by mouth daily with breakfast. 30 tablet 11   atorvastatin (LIPITOR) 20 MG tablet Take 1 tablet (20 mg total) by mouth every evening. (Patient taking differently: Take 40 mg by mouth every evening.) 90 tablet 3   benzonatate (TESSALON) 100 MG capsule Take by mouth 3 (three) times daily.     Bromfenac Sodium (PROLENSA) 0.07 % SOLN PLACE  1 DROP IN THE RIGHT EYE FOUR TIMES DAILY (Patient taking differently: Place 1 drop into the right eye 3 (three) times daily. PLACE 1 DROP IN THE RIGHT EYE FOUR TIMES DAILY) 3 mL 6   cholecalciferol (VITAMIN D) 1000 UNITS tablet Take 1,000 Units by mouth daily.      Coenzyme Q10 (COQ-10) 200 MG CAPS Take 200 mg by mouth daily.      dapagliflozin propanediol (FARXIGA) 10 MG TABS tablet Take 1 tablet (10 mg total) by mouth daily. 90 tablet 3   GNP ULTICARE PEN NEEDLES 31G X 5 MM MISC See admin instructions.     hydrochlorothiazide (HYDRODIURIL) 12.5 MG tablet Take 12.5 mg by mouth daily.     levothyroxine (SYNTHROID) 75 MCG tablet Take 1 tablet (75 mcg total) by mouth daily before breakfast. 90 tablet 3   lisinopril (ZESTRIL) 40 MG tablet Take 40 mg by mouth daily.     Omega-3 Fatty Acids (FISH OIL) 1200 MG CAPS Take 1,200 mg by mouth every other day.      prednisoLONE acetate (PRED FORTE) 1 % ophthalmic suspension INSTILL 1 DROP IN THE RIGHT EYE FOUR TIMES DAILY (Patient taking differently: Place 1 drop into the right eye 3 (three) times daily.) 10 mL 2   TOUJEO SOLOSTAR 300 UNIT/ML SOPN Inject 50 Units into the skin at bedtime.      TRULICITY 0.75 MG/0.5ML SOPN Inject 0.75 mg into the skin once a week.     No current facility-administered medications for this visit.    Allergies as of 10/25/2022 - Review Complete 10/25/2022  Allergen Reaction Noted   Penicillins Hives 07/21/2011   Hydrocodone Nausea And Vomiting 12/30/2017   Sulfa antibiotics  09/25/2021    Family History  Problem Relation Age of Onset   Cancer Father    Diabetes Father    Colon cancer Sister     Social History   Socioeconomic History   Marital status: Widowed    Spouse name: Not on file   Number of children: Not on file   Years of education: Not on file   Highest education level: Not on file  Occupational History   Not on file  Tobacco Use   Smoking status: Never   Smokeless tobacco: Never  Vaping Use    Vaping Use: Never used  Substance and Sexual Activity   Alcohol use: No  Drug use: No   Sexual activity: Yes    Birth control/protection: Post-menopausal  Other Topics Concern   Not on file  Social History Narrative   Not on file   Social Determinants of Health   Financial Resource Strain: Not on file  Food Insecurity: Not on file  Transportation Needs: Not on file  Physical Activity: Not on file  Stress: Not on file  Social Connections: Not on file  Intimate Partner Violence: Not on file     Review of Systems   Gen: Denies any fever, chills, fatigue, weight loss, lack of appetite.  CV: Denies chest pain, heart palpitations, peripheral edema, syncope.  Resp: Denies shortness of breath at rest or with exertion. Denies wheezing or cough.  GI: Denies dysphagia or odynophagia. Denies jaundice, hematemesis, fecal incontinence. GU : Denies urinary burning, urinary frequency, urinary hesitancy MS: Denies joint pain, muscle weakness, cramps, or limitation of movement.  Derm: Denies rash, itching, dry skin Psych: Denies depression, anxiety, memory loss, and confusion Heme: Denies bruising, bleeding, and enlarged lymph nodes.   Physical Exam   BP (!) 144/79 (BP Location: Right Arm, Patient Position: Sitting, Cuff Size: Normal)   Pulse 92   Temp (!) 97.5 F (36.4 C) (Temporal)   Ht 5\' 3"  (1.6 m)   Wt 152 lb (68.9 kg)   BMI 26.93 kg/m  General:   Alert and oriented. Pleasant and cooperative. Well-nourished and well-developed.  Head:  Normocephalic and atraumatic. Eyes:  Without icterus Lungs: clear bilaterally Cardiac: S1 S2 present without murmurs Abdomen:  +BS, soft, non-tender and non-distended. No HSM noted. No guarding or rebound. No masses appreciated.  Rectal:  Deferred  Msk:  Symmetrical without gross deformities. Normal posture. Extremities:  Without edema. Neurologic:  Alert and  oriented x4;  grossly normal neurologically. Skin:  Intact without significant  lesions or rashes. Psych:  Alert and cooperative. Normal mood and affect.   Assessment   Megan Schroeder is a 77 y.o. female presenting today in follow-up with a history of  multiple colonic polyps over the years with last colonoscopy in 2021 noting multiple tubular adenomas. FH of colon cancer in sister. Due for 3-year-surveillance now.  She has no concerning lower or upper GI signs/symptoms. No longer needs PPI as no GERD symptoms.      PLAN    Proceed with colonoscopy by Dr. Jena Gauss in near future: the risks, benefits, and alternatives have been discussed with the patient in detail. The patient states understanding and desires to proceed. ASA 3 Stop Farxiga 72 hours before the procedure. Only take 1/2 dose of evening insulin the night before. hold Trulicity one week before the procedure.    Gelene Mink, PhD, ANP-BC [facility]

## 2022-10-26 ENCOUNTER — Encounter: Payer: Self-pay | Admitting: *Deleted

## 2022-10-26 ENCOUNTER — Telehealth: Payer: Self-pay | Admitting: *Deleted

## 2022-10-26 ENCOUNTER — Other Ambulatory Visit: Payer: Self-pay | Admitting: *Deleted

## 2022-10-26 MED ORDER — PEG 3350-KCL-NA BICARB-NACL 420 G PO SOLR: 4000.0000 mL | Freq: Once | ORAL | 0 refills | Status: DC

## 2022-10-26 MED ORDER — NA SULFATE-K SULFATE-MG SULF 17.5-3.13-1.6 GM/177ML PO SOLN: ORAL | 0 refills | Status: DC

## 2022-10-26 NOTE — Telephone Encounter (Signed)
[employer] PA: Notification or Prior Authorization is not required for the requested services You are not required to submit a notification/prior authorization based on the information provided. If you have general questions about the prior authorization requirements, visit [email or web address] > Clinician Resources > Advance and Admission Notification Requirements. The number above acknowledges your notification. Please write this reference number down for future reference. If you would like to request an organization determination, please call us at [phone]. Decision ID #: [id number]

## 2022-10-29 ENCOUNTER — Encounter: Payer: Self-pay | Admitting: *Deleted

## 2022-11-01 NOTE — Progress Notes (Signed)
. [facility] - Clinic Note  11/02/2022     CHIEF COMPLAINT Patient presents for Retina Follow Up  HISTORY OF PRESENT ILLNESS: Megan Schroeder is a 77 y.o. female who presents to the clinic today for:   HPI     Retina Follow Up   In both eyes.  This started 5 weeks ago.  Duration of 5 weeks.  Since onset it is stable.  I, the attending physician,  performed the HPI with the patient and updated documentation appropriately.        Comments   5 week retina follow up ERM and I'VE OD pt is reporting no vision changes noticed she has some floaters but denies any flashes of light last reading 140 this am       Last edited by Rennis Chris, MD on 11/02/2022  2:46 PM.      Patient states   Referring physician: Lupita Raider, NP [address] Laurey Morale [place],  [place] [address]  HISTORICAL INFORMATION:   Selected notes from the MEDICAL RECORD NUMBER Referred by Dr. Laural Benes   CURRENT MEDICATIONS: Current Outpatient Medications (Ophthalmic Drugs)  Medication Sig   Bromfenac Sodium (PROLENSA) 0.07 % SOLN PLACE 1 DROP IN THE RIGHT EYE FOUR TIMES DAILY (Patient taking differently: Place 1 drop into the right eye 3 (three) times daily. PLACE 1 DROP IN THE RIGHT EYE FOUR TIMES DAILY)   prednisoLONE acetate (PRED FORTE) 1 % ophthalmic suspension INSTILL 1 DROP IN THE RIGHT EYE FOUR TIMES DAILY (Patient taking differently: Place 1 drop into the right eye 3 (three) times daily.)   No current facility-administered medications for this visit. (Ophthalmic Drugs)   Current Outpatient Medications (Other)  Medication Sig   Na Sulfate-K Sulfate-Mg Sulf 17.5-3.13-1.6 GM/177ML SOLN As directed   acetaminophen (TYLENOL) 325 MG tablet Take 2 tablets (650 mg total) by mouth every 6 (six) hours as needed for mild pain (or Fever >/= 101).   amLODipine (NORVASC) 10 MG tablet Take 1 tablet (10 mg total) by mouth daily. For BP   Ascorbic Acid (VITAMIN C) 1000 MG tablet Take 1,000 mg by  mouth daily.   aspirin EC 81 MG tablet Take 1 tablet (81 mg total) by mouth daily with breakfast.   atorvastatin (LIPITOR) 20 MG tablet Take 1 tablet (20 mg total) by mouth every evening. (Patient taking differently: Take 40 mg by mouth every evening.)   benzonatate (TESSALON) 100 MG capsule Take by mouth 3 (three) times daily.   cholecalciferol (VITAMIN D) 1000 UNITS tablet Take 1,000 Units by mouth daily.    Coenzyme Q10 (COQ-10) 200 MG CAPS Take 200 mg by mouth daily.    dapagliflozin propanediol (FARXIGA) 10 MG TABS tablet Take 1 tablet (10 mg total) by mouth daily.   GNP ULTICARE PEN NEEDLES 31G X 5 MM MISC See admin instructions.   hydrochlorothiazide (HYDRODIURIL) 12.5 MG tablet Take 12.5 mg by mouth daily.   levothyroxine (SYNTHROID) 75 MCG tablet Take 1 tablet (75 mcg total) by mouth daily before breakfast.   lisinopril (ZESTRIL) 40 MG tablet Take 40 mg by mouth daily.   Omega-3 Fatty Acids (FISH OIL) 1200 MG CAPS Take 1,200 mg by mouth every other day.    TOUJEO SOLOSTAR 300 UNIT/ML SOPN Inject 50 Units into the skin at bedtime.    TRULICITY 0.75 MG/0.5ML SOPN Inject 0.75 mg into the skin once a week.   No current facility-administered medications for this visit. (Other)   REVIEW OF SYSTEMS:  ROS   Positive for: Gastrointestinal, Endocrine, Cardiovascular, Eyes Negative for: Constitutional, Neurological, Skin, Genitourinary, Musculoskeletal, HENT, Respiratory, Psychiatric, Allergic/Imm, Heme/Lymph Last edited by Etheleen Mayhew, COT on 11/02/2022  7:51 AM.     ALLERGIES Allergies  Allergen Reactions   Penicillins Hives    Has patient had a PCN reaction causing immediate rash, facial/tongue/throat swelling, SOB or lightheadedness with hypotension: No Has patient had a PCN reaction causing severe rash involving mucus membranes or skin necrosis: Yes Has patient had a PCN reaction that required hospitalization: No Has patient had a PCN reaction occurring within the last 10  years: No If all of the above answers are "NO", then may proceed with Cephalosporin use.     Hydrocodone Nausea And Vomiting   Sulfa Antibiotics    PAST MEDICAL HISTORY Past Medical History:  Diagnosis Date   Anxiety    CAD (coronary artery disease)    Diabetic retinopathy (HCC)    NPDR OU   Hyperlipidemia    Hypertension    Hypertensive retinopathy    OU   Hypothyroidism    Type 2 diabetes mellitus (HCC)    Past Surgical History:  Procedure Laterality Date   BREAST EXCISIONAL BIOPSY Left    CATARACT EXTRACTION W/PHACO Left 12/16/2017   Procedure: CATARACT EXTRACTION PHACO AND INTRAOCULAR LENS PLACEMENT (IOC);  Surgeon: Gemma Payor, MD;  Location: [facility];  Service: Ophthalmology;  Laterality: Left;  CDE: 11.65   CATARACT EXTRACTION W/PHACO Right 12/30/2017   Procedure: CATARACT EXTRACTION PHACO AND INTRAOCULAR LENS PLACEMENT RIGHT EYE;  Surgeon: Gemma Payor, MD;  Location: [facility];  Service: Ophthalmology;  Laterality: Right;  CDE: 9.07   COLONOSCOPY N/A 09/15/2015   Dr. Geni Bers multiple rectal and colonic polyps removed. Hepatic flexure with 9 mm polyp, multiple 5-7 mm polyps. Multiple cecal polyps with largest 1.5 cm, one 5 mm polyp in rectum. Path for colonic polyps with sessile serrated polyp without dysplasia, no high grade dysplasia, tubular adenomas, rectal hyperplastic polyp   COLONOSCOPY WITH PROPOFOL N/A 07/20/2019   multiple tubular adenomas. diverticulosis. 3 year surveillance   EYE SURGERY Bilateral 2019   Cat Sx - Dr. Gemma Payor   LEFT HEART CATHETERIZATION WITH CORONARY ANGIOGRAM N/A 07/23/2011   Procedure: LEFT HEART CATHETERIZATION WITH CORONARY ANGIOGRAM;  Surgeon: Wendall Stade, MD;  Location: [facility] CATH LAB;  Service: Cardiovascular;  Laterality: N/A;   POLYPECTOMY  07/20/2019   Procedure: POLYPECTOMY;  Surgeon: Corbin Ade, MD;  Location: [facility] SUITE;  Service: Endoscopy;;   YAG LASER APPLICATION Right 09/01/2020   Dr. Rennis Chris   YAG LASER APPLICATION  Left 09/01/2020   Dr. Rennis Chris   FAMILY HISTORY Family History  Problem Relation Age of Onset   Cancer Father    Diabetes Father    Colon cancer Sister    SOCIAL HISTORY Social History   Tobacco Use   Smoking status: Never   Smokeless tobacco: Never  Vaping Use   Vaping Use: Never used  Substance Use Topics   Alcohol use: No   Drug use: No       OPHTHALMIC EXAM:  Base Eye Exam     Visual Acuity (Snellen - Linear)       Right Left   Dist cc 20/30 20/20   Dist ph cc NI          Tonometry (Tonopen, 7:54 AM)       Right Left   Pressure 12 19         Pupils  Pupils Dark Light Shape React APD   Right PERRL 3 2 Round Brisk None   Left PERRL 3 2 Round Brisk None         Visual Fields       Left Right    Full Full         Extraocular Movement       Right Left    Full, Ortho Full, Ortho         Neuro/Psych     Oriented x3: Yes   Mood/Affect: Normal         Dilation     Both eyes: 2.5% Phenylephrine @ 7:54 AM           Slit Lamp and Fundus Exam     Slit Lamp Exam       Right Left   Lids/Lashes Dermatochalasis - upper lid, mild MGD Dermatochalasis - upper lid, mild MGD   Conjunctiva/Sclera white and quiet white and quiet   Cornea 1-2+PEE, well healed temporal cataract wound 1+PEE, well healed temporal cataract wound   Anterior Chamber deep, clear, narrow temporal angle deep and clear   Iris round and dilated, No NVI round and dilated, No NVI   Lens PC IOL in good position, open PC PC IOL in good position with open PC   Anterior Vitreous syneresis, PVD syneresis, Posterior vitreous detachment, vitreous condensations         Fundus Exam       Right Left   Disc pink and sharp, compact, mild PPA pink and sharp, compact, mild PPP   C/D Ratio 0.3 0.1   Macula flat, blunted foveal reflex, ERM with striae inferiorly, punctate IRH, +central cyst / edema -- slightly improved, no exudates flat, blunted foveal reflex, mild  ERM, rare MA, no edema, focal pigment clumping temporal to fovea   Vessels attenuated, tortuous greatest IT arcades attenuated, tortuous   Periphery attached, scattered MA / DBH attached, rare MA/DBH, no RT/RD 360           Refraction     Wearing Rx       Sphere Cylinder Axis Add   Right [place]   +2.50   Left -0.25 +0.50 177 +2.50           IMAGING AND PROCEDURES  Imaging and Procedures for 11/02/2022  OCT, Retina - OU - Both Eyes       Right Eye Quality was good. Central Foveal Thickness: 462. Progression has improved. Findings include no SRF, abnormal foveal contour, epiretinal membrane, intraretinal fluid, macular pucker (ERM with pucker stable; persistent IRF and central cyst -- slightly improved).   Left Eye Quality was good. Central Foveal Thickness: 263. Progression has been stable. Findings include normal foveal contour, no IRF, no SRF, epiretinal membrane, macular pucker.   Notes *Images captured and stored on drive  Diagnosis / Impression:  OD: ERM with pucker stable; persistent IRF and central cyst -- slightly improved OS: mild ERM with early pucker, partial PVD  Clinical management:  See below  Abbreviations: NFP - Normal foveal profile. CME - cystoid macular edema. PED - pigment epithelial detachment. IRF - intraretinal fluid. SRF - subretinal fluid. EZ - ellipsoid zone. ERM - epiretinal membrane. ORA - outer retinal atrophy. ORT - outer retinal tubulation. SRHM - subretinal hyper-reflective material. IRHM - intraretinal hyper-reflective material      Intravitreal Injection, Pharmacologic Agent - OD - Right Eye       Time Out 11/02/2022. 8:31 AM. Confirmed correct patient, procedure,  site, and patient consented.   Anesthesia Topical anesthesia was used. Anesthetic medications included Lidocaine 2%, Proparacaine 0.5%.   Procedure Preparation included 5% betadine to ocular surface, eyelid speculum. A (32g) needle was used.   Injection: 2 mg  aflibercept 2 MG/0.05ML   Route: Intravitreal, Site: Right Eye   NDC: L6038910, Lot: [id number], Expiration date: 10/09/2023, Waste: 0 mL   Post-op Post injection exam found visual acuity of at least counting fingers. The patient tolerated the procedure well. There were no complications. The patient received written and verbal post procedure care education. Post injection medications were not given.            ASSESSMENT/PLAN:    ICD-10-CM   1. Epiretinal membrane (ERM) of both eyes  H35.373 OCT, Retina - OU - Both Eyes    2. Moderate nonproliferative diabetic retinopathy of both eyes with macular edema associated with type 2 diabetes mellitus (HCC)  V78.4696 Intravitreal Injection, Pharmacologic Agent - OD - Right Eye    aflibercept (EYLEA) SOLN 2 mg    3. Current use of insulin (HCC)  Z79.4     4. Long term (current) use of oral hypoglycemic drugs  Z79.84     5. Long-term (current) use of injectable non-insulin antidiabetic drugs  Z79.85     6. Essential hypertension  I10     7. Hypertensive retinopathy of both eyes  H35.033     8. Pseudophakia of both eyes  Z96.1      1. Epiretinal membrane OU -- stable  - BCVA OD 20/30; OS 20/20 - OCT shows OD: ERM with pucker and prominent central cyst; persistent IRF and central cyst; OS: mild ERM with early pucker, partial PVD  -- slightly increased  - IRF OD: ?DME vs CME vs cystic changes - started PF and Prolensa QID OD only for possible CME component on 12.8.22 -- continue, but suspect DME may be significant component  - no metamorphopsia  - monitor for now  - f/u 5 weeks -- DFE/OCT  2-5. Moderate nonproliferative diabetic retinopathy OU - s/p IVA OD #1 (02.16.23), #2 (03.20.23), #3 (04.17.23), #4 (05.22.23), #5 (06.22.23), #6 (07.27.23),  #7 (09.01.23) #8 (10.06.23), #9 (11.09.23), #10 (12.15.23) #11 (01.12.24), #12 (02.09.24) #13 (03.15.24) - s/p IVE OD #1 (04.18.24) - BCVA OD: stable at 20/30, OS: 20/20  - exam shows  scattered MA, no NV - OCT shows OD: ERM with pucker stable; persistent IRF and central cyst -- slightly improved; OS: mild ERM with early pucker, partial PVD at 5 weeks - recommend IVE OD #2 today, 05.24.24 for DME component w/ f/u in 5 wks - pt wishes to proceed - RBA of procedure discussed, questions answered - IVA informed consent obtained and signed, 02.16.23 (OD) - IVE informed consent obtained and signed, 04.19.24 (OD)  - have obtained Eylea approval for 2024 - see procedure note  - f/u in 5 wks - DFE/OCT, possible injxn  6,7. Hypertensive retinopathy OU - discussed importance of tight BP control - monitor   8. Pseudophakia OU  - s/p CE/IOL OU, 2019 (Dr. Alto [place])  - IOLs in good position, doing well  - s/p YAG cap OD 3.24.22  - s/p YAG cap OS 04.07.22  - monitor  Ophthalmic Meds Ordered this visit:  Meds ordered this encounter  Medications   aflibercept (EYLEA) SOLN 2 mg     Return in about 5 weeks (around 12/07/2022) for f/u NPDR OU, DFE, OCT.  There are no Patient Instructions on file for this visit.  This document serves as a record of services personally performed by Karie Chimera, MD, PhD. It was created on their behalf by Berlin Hun COT, an ophthalmic technician. The creation of this record is the provider's dictation and/or activities during the visit.    Electronically signed by: Berlin Hun COT 05.24.2024 1:43 AM  This document serves as a record of services personally performed by Karie Chimera, MD, PhD. It was created on their behalf by Glee Arvin. Manson Passey, OA an ophthalmic technician. The creation of this record is the provider's dictation and/or activities during the visit.    Electronically signed by: Glee Arvin. Manson Passey, [place] 05.24.2024 1:43 AM  Karie Chimera, M.D., Ph.D. Diseases & Surgery of the Retina and Vitreous [facility] 11/02/2022   I have reviewed the above documentation for accuracy and completeness, and I  agree with the above. Karie Chimera, M.D., Ph.D. 11/05/22 1:44 AM   Abbreviations: M myopia (nearsighted); A astigmatism; H hyperopia (farsighted); P presbyopia; Mrx spectacle prescription;  CTL contact lenses; OD right eye; OS left eye; OU both eyes  XT exotropia; ET esotropia; PEK punctate epithelial keratitis; PEE punctate epithelial erosions; DES dry eye syndrome; MGD meibomian gland dysfunction; ATs artificial tears; PFAT's preservative free artificial tears; NSC nuclear sclerotic cataract; PSC posterior subcapsular cataract; ERM epi-retinal membrane; PVD posterior vitreous detachment; RD retinal detachment; DM diabetes mellitus; DR diabetic retinopathy; NPDR non-proliferative diabetic retinopathy; PDR proliferative diabetic retinopathy; CSME clinically significant macular edema; DME diabetic macular edema; dbh dot blot hemorrhages; CWS cotton wool spot; POAG primary open angle glaucoma; C/D cup-to-disc ratio; HVF humphrey visual field; GVF goldmann visual field; OCT optical coherence tomography; IOP intraocular pressure; BRVO Branch retinal vein occlusion; CRVO central retinal vein occlusion; CRAO central retinal artery occlusion; BRAO branch retinal artery occlusion; RT retinal tear; SB scleral buckle; PPV pars plana vitrectomy; VH Vitreous hemorrhage; PRP panretinal laser photocoagulation; IVK intravitreal kenalog; VMT vitreomacular traction; MH Macular hole;  NVD neovascularization of the disc; NVE neovascularization elsewhere; AREDS age related eye disease study; ARMD age related macular degeneration; POAG primary open angle glaucoma; EBMD epithelial/anterior basement membrane dystrophy; ACIOL anterior chamber intraocular lens; IOL intraocular lens; PCIOL posterior chamber intraocular lens; Phaco/IOL phacoemulsification with intraocular lens placement; PRK photorefractive keratectomy; LASIK laser assisted in situ keratomileusis; HTN hypertension; DM diabetes mellitus; COPD chronic obstructive  pulmonary disease

## 2022-11-02 ENCOUNTER — Ambulatory Visit (INDEPENDENT_AMBULATORY_CARE_PROVIDER_SITE_OTHER): Payer: Medicare Other | Admitting: Ophthalmology

## 2022-11-02 ENCOUNTER — Encounter (INDEPENDENT_AMBULATORY_CARE_PROVIDER_SITE_OTHER): Payer: Self-pay | Admitting: Ophthalmology

## 2022-11-02 DIAGNOSIS — Z794 Long term (current) use of insulin: Secondary | ICD-10-CM | POA: Diagnosis not present

## 2022-11-02 DIAGNOSIS — Z7984 Long term (current) use of oral hypoglycemic drugs: Secondary | ICD-10-CM

## 2022-11-02 DIAGNOSIS — E113313 Type 2 diabetes mellitus with moderate nonproliferative diabetic retinopathy with macular edema, bilateral: Secondary | ICD-10-CM | POA: Diagnosis not present

## 2022-11-02 DIAGNOSIS — Z7985 Long-term (current) use of injectable non-insulin antidiabetic drugs: Secondary | ICD-10-CM

## 2022-11-02 DIAGNOSIS — I10 Essential (primary) hypertension: Secondary | ICD-10-CM | POA: Diagnosis not present

## 2022-11-02 DIAGNOSIS — H35373 Puckering of macula, bilateral: Secondary | ICD-10-CM | POA: Diagnosis not present

## 2022-11-02 DIAGNOSIS — Z961 Presence of intraocular lens: Secondary | ICD-10-CM

## 2022-11-02 DIAGNOSIS — H35033 Hypertensive retinopathy, bilateral: Secondary | ICD-10-CM

## 2022-11-02 MED ORDER — AFLIBERCEPT 2MG/0.05ML IZ SOLN FOR KALEIDOSCOPE
2.0000 mg | INTRAVITREAL | Status: AC | PRN
  Administered 2022-11-02: 2 mg via INTRAVITREAL

## 2022-12-03 NOTE — Progress Notes (Signed)
. [facility] - Clinic Note  12/10/2022     CHIEF COMPLAINT Patient presents for Retina Follow Up  HISTORY OF PRESENT ILLNESS: Megan Schroeder is a 77 y.o. female who presents to the clinic today for:   HPI     Retina Follow Up   Patient presents with  Diabetic Retinopathy.  In both eyes.  This started 5 weeks ago.  I, the attending physician,  performed the HPI with the patient and updated documentation appropriately.        Comments   Patient here for 5 weeks retina follow up for NPDR OU. Patient states vision about the same. No changes. No eye pain A1C 7.0 gets another A1C this month. Using drops Prolenas BID OD and Prednisone BID OD.       Last edited by Rennis Chris, MD on 12/10/2022 12:25 PM.    Patient states vision is stable, she is using PF and Prolensa BID OD  Referring physician: Lupita Raider, NP [address] [place],  [place] [address]  HISTORICAL INFORMATION:   Selected notes from the MEDICAL RECORD NUMBER Referred by Dr. Laural Benes   CURRENT MEDICATIONS: Current Outpatient Medications (Ophthalmic Drugs)  Medication Sig   Bromfenac Sodium (PROLENSA) 0.07 % SOLN PLACE 1 DROP IN THE RIGHT EYE FOUR TIMES DAILY (Patient taking differently: Place 1 drop into the right eye 2 (two) times daily.)   prednisoLONE acetate (PRED FORTE) 1 % ophthalmic suspension INSTILL 1 DROP IN THE RIGHT EYE FOUR TIMES DAILY (Patient taking differently: Place 1 drop into the right eye in the morning and at bedtime.)   No current facility-administered medications for this visit. (Ophthalmic Drugs)   Current Outpatient Medications (Other)  Medication Sig   amLODipine (NORVASC) 10 MG tablet Take 1 tablet (10 mg total) by mouth daily. For BP   Ascorbic Acid (VITAMIN C) 1000 MG tablet Take 1,000 mg by mouth daily.   atorvastatin (LIPITOR) 40 MG tablet Take 40 mg by mouth daily.   calcium carbonate (OS-CAL) 600 MG tablet Take 600 mg by mouth daily.   cholecalciferol  (VITAMIN D) 1000 UNITS tablet Take 1,000 Units by mouth daily.    Coenzyme Q10 (COQ-10) 200 MG CAPS Take 200 mg by mouth daily.    dapagliflozin propanediol (FARXIGA) 10 MG TABS tablet Take 1 tablet (10 mg total) by mouth daily.   Dulaglutide (TRULICITY) 4.5 MG/0.5ML SOPN Inject 4.5 mg into the skin every Sunday.   GNP ULTICARE PEN NEEDLES 31G X 5 MM MISC See admin instructions.   hydrochlorothiazide (HYDRODIURIL) 12.5 MG tablet Take 12.5 mg by mouth daily.   levothyroxine (SYNTHROID) 75 MCG tablet Take 1 tablet (75 mcg total) by mouth daily before breakfast.   lisinopril (ZESTRIL) 40 MG tablet Take 40 mg by mouth daily.   Na Sulfate-K Sulfate-Mg Sulf 17.5-3.13-1.6 GM/177ML SOLN As directed   TOUJEO SOLOSTAR 300 UNIT/ML SOPN Inject 40 Units into the skin at bedtime.   aspirin EC 81 MG tablet Take 1 tablet (81 mg total) by mouth daily with breakfast. (Patient not taking: Reported on 11/29/2022)   No current facility-administered medications for this visit. (Other)   REVIEW OF SYSTEMS: ROS   Positive for: Gastrointestinal, Endocrine, Cardiovascular, Eyes Negative for: Constitutional, Neurological, Skin, Genitourinary, Musculoskeletal, HENT, Respiratory, Psychiatric, Allergic/Imm, Heme/Lymph Last edited by Laddie Aquas, COA on 12/10/2022  7:57 AM.      ALLERGIES Allergies  Allergen Reactions   Penicillins Hives  Hydrocodone Nausea And Vomiting   Sulfa Antibiotics Rash   PAST MEDICAL HISTORY Past Medical History:  Diagnosis Date   Anxiety    CAD (coronary artery disease)    Diabetic retinopathy (HCC)    NPDR OU   Hyperlipidemia    Hypertension    Hypertensive retinopathy    OU   Hypothyroidism    Type 2 diabetes mellitus (HCC)    Past Surgical History:  Procedure Laterality Date   BREAST EXCISIONAL BIOPSY Left    CATARACT EXTRACTION W/PHACO Left 12/16/2017   Procedure: CATARACT EXTRACTION PHACO AND INTRAOCULAR LENS PLACEMENT (IOC);  Surgeon: Gemma Payor, MD;   Location: [facility];  Service: Ophthalmology;  Laterality: Left;  CDE: 11.65   CATARACT EXTRACTION W/PHACO Right 12/30/2017   Procedure: CATARACT EXTRACTION PHACO AND INTRAOCULAR LENS PLACEMENT RIGHT EYE;  Surgeon: Gemma Payor, MD;  Location: [facility];  Service: Ophthalmology;  Laterality: Right;  CDE: 9.07   COLONOSCOPY N/A 09/15/2015   Dr. Geni Bers multiple rectal and colonic polyps removed. Hepatic flexure with 9 mm polyp, multiple 5-7 mm polyps. Multiple cecal polyps with largest 1.5 cm, one 5 mm polyp in rectum. Path for colonic polyps with sessile serrated polyp without dysplasia, no high grade dysplasia, tubular adenomas, rectal hyperplastic polyp   COLONOSCOPY WITH PROPOFOL N/A 07/20/2019   multiple tubular adenomas. diverticulosis. 3 year surveillance   EYE SURGERY Bilateral 2019   Cat Sx - Dr. Gemma Payor   LEFT HEART CATHETERIZATION WITH CORONARY ANGIOGRAM N/A 07/23/2011   Procedure: LEFT HEART CATHETERIZATION WITH CORONARY ANGIOGRAM;  Surgeon: Wendall Stade, MD;  [facility] CATH LAB;  Service: Cardiovascular;  Laterality: N/A;   POLYPECTOMY  07/20/2019   Procedure: POLYPECTOMY;  Surgeon: Corbin Ade, MD;  Location: [facility] SUITE;  Service: Endoscopy;;   YAG LASER APPLICATION Right 09/01/2020   Dr. Rennis Chris   YAG LASER APPLICATION Left 09/01/2020   Dr. Rennis Chris   FAMILY HISTORY Family History  Problem Relation Age of Onset   Cancer Father    Diabetes Father    Colon cancer Sister    SOCIAL HISTORY Social History   Tobacco Use   Smoking status: Never   Smokeless tobacco: Never  Vaping Use   Vaping Use: Never used  Substance Use Topics   Alcohol use: No   Drug use: No       OPHTHALMIC EXAM:  Base Eye Exam     Visual Acuity (Snellen - Linear)       Right Left   Dist cc 20/30 -1 20/20   Dist ph cc NI     Correction: Glasses         Tonometry (Tonopen, 7:54 AM)       Right Left   Pressure 16 16         Pupils       Dark Light Shape React APD    Right 3 2 Round Brisk None   Left 3 2 Round Brisk None         Visual Fields (Counting fingers)       Left Right    Full Full         Extraocular Movement       Right Left    Full, Ortho Full, Ortho         Neuro/Psych     Oriented x3: Yes   Mood/Affect: Normal         Dilation     Both eyes: 1.0% Mydriacyl, 2.5% Phenylephrine @  7:53 AM           Slit Lamp and Fundus Exam     Slit Lamp Exam       Right Left   Lids/Lashes Dermatochalasis - upper lid, mild MGD Dermatochalasis - upper lid, mild MGD   Conjunctiva/Sclera white and quiet white and quiet   Cornea 1-2+PEE, well healed temporal cataract wound 1+PEE, well healed temporal cataract wound   Anterior Chamber deep, clear, narrow temporal angle deep and clear   Iris round and dilated, No NVI round and dilated, No NVI   Lens PC IOL in good position, open PC PC IOL in good position with open PC   Anterior Vitreous syneresis, PVD syneresis, Posterior vitreous detachment, vitreous condensations         Fundus Exam       Right Left   Disc pink and sharp, compact, mild PPA pink and sharp, compact, mild PPP   C/D Ratio 0.3 0.1   Macula flat, blunted foveal reflex, ERM with striae inferiorly, punctate IRH, +central cyst / edema, no exudates flat, blunted foveal reflex, mild ERM, rare MA, no edema, focal pigment clumping temporal to fovea   Vessels attenuated, tortuous greatest IT arcades attenuated, tortuous   Periphery attached, scattered MA / DBH attached, rare MA/DBH, no RT/RD 360           Refraction     Wearing Rx       Sphere Cylinder Axis Add   Right [place]   +2.50   Left -0.25 +0.50 177 +2.50           IMAGING AND PROCEDURES  Imaging and Procedures for 12/10/2022  OCT, Retina - OU - Both Eyes       Right Eye Quality was borderline. Central Foveal Thickness: 472. Progression has been stable. Findings include no SRF, abnormal foveal contour, epiretinal membrane, intraretinal  fluid, macular pucker (ERM with pucker stable; mild persistent IRF and central cyst ).   Left Eye Quality was good. Central Foveal Thickness: 258. Progression has been stable. Findings include normal foveal contour, no IRF, no SRF, epiretinal membrane, macular pucker.   Notes *Images captured and stored on drive  Diagnosis / Impression:  OD: ERM with pucker stable; mild persistent IRF and central cyst  OS: mild ERM with early pucker, partial PVD  Clinical management:  See below  Abbreviations: NFP - Normal foveal profile. CME - cystoid macular edema. PED - pigment epithelial detachment. IRF - intraretinal fluid. SRF - subretinal fluid. EZ - ellipsoid zone. ERM - epiretinal membrane. ORA - outer retinal atrophy. ORT - outer retinal tubulation. SRHM - subretinal hyper-reflective material. IRHM - intraretinal hyper-reflective material      Intravitreal Injection, Pharmacologic Agent - OD - Right Eye       Time Out 12/10/2022. 8:42 AM. Confirmed correct patient, procedure, site, and patient consented.   Anesthesia Topical anesthesia was used. Anesthetic medications included Lidocaine 2%, Proparacaine 0.5%.   Procedure Preparation included 5% betadine to ocular surface, eyelid speculum. A (32g) needle was used.   Injection: 2 mg aflibercept 2 MG/0.05ML   Route: Intravitreal, Site: Right Eye   NDC: L6038910, Lot: [id number], Expiration date: 10/09/2023, Waste: 0 mL   Post-op Post injection exam found visual acuity of at least counting fingers. The patient tolerated the procedure well. There were no complications. The patient received written and verbal post procedure care education. Post injection medications were not given.            ASSESSMENT/PLAN:  ICD-10-CM   1. Epiretinal membrane (ERM) of both eyes  H35.373     2. Moderate nonproliferative diabetic retinopathy of both eyes with macular edema associated with type 2 diabetes mellitus (HCC)  E11.3313 OCT, Retina -  OU - Both Eyes    Intravitreal Injection, Pharmacologic Agent - OD - Right Eye    aflibercept (EYLEA) SOLN 2 mg    3. Current use of insulin (HCC)  Z79.4     4. Long term (current) use of oral hypoglycemic drugs  Z79.84     5. Long-term (current) use of injectable non-insulin antidiabetic drugs  Z79.85     6. Essential hypertension  I10     7. Hypertensive retinopathy of both eyes  H35.033     8. Pseudophakia of both eyes  Z96.1      1. Epiretinal membrane OU -- stable  - BCVA OD 20/30; OS 20/20 - OCT shows OD: ERM with pucker and prominent central cyst; persistent IRF and central cyst; OS: mild ERM with early pucker, partial PVD    - IRF OD: ?DME vs CME vs cystic changes - started PF and Prolensa QID OD only for possible CME component on 12.8.22 -- continue, but suspect DME may be significant component  - no metamorphopsia  - monitor for now  - f/u 6 weeks -- DFE/OCT  2-5. Moderate nonproliferative diabetic retinopathy OU - s/p IVA OD #1 (02.16.23), #2 (03.20.23), #3 (04.17.23), #4 (05.22.23), #5 (06.22.23), #6 (07.27.23),  #7 (09.01.23) #8 (10.06.23), #9 (11.09.23), #10 (12.15.23) #11 (01.12.24), #12 (02.09.24) #13 (03.15.24) - s/p IVE OD #1 (04.18.24), #2 (05.24.24) - BCVA OD: stable at 20/30, OS: 20/20  - exam shows scattered MA, no NV - OCT shows OD: ERM with pucker stable; mild persistent IRF and central cyst; OS: mild ERM with early pucker, partial PVD at 5 weeks - recommend IVE OD #3 today, 07.01.24 for DME component w/ f/u extended to 6 wks - pt wishes to proceed - RBA of procedure discussed, questions answered - IVE informed consent obtained and signed, 04.19.24 (OD)  - have obtained Eylea approval for 2024 - see procedure note  - f/u in 6 wks - DFE/OCT, possible injxn  6,7. Hypertensive retinopathy OU - discussed importance of tight BP control - monitor   8. Pseudophakia OU  - s/p CE/IOL OU, 2019 (Dr. Alto [place])  - IOLs in good position, doing well  - s/p YAG cap  OD 3.24.22  - s/p YAG cap OS 04.07.22  - monitor  Ophthalmic Meds Ordered this visit:  Meds ordered this encounter  Medications   aflibercept (EYLEA) SOLN 2 mg     Return in about 6 weeks (around 01/21/2023) for f/u NPDR OU, DFE, OCT.  There are no Patient Instructions on file for this visit.  This document serves as a record of services personally performed by Karie Chimera, MD, PhD. It was created on their behalf by Glee Arvin. Manson Passey, OA an ophthalmic technician. The creation of this record is the provider's dictation and/or activities during the visit.    Electronically signed by: Glee Arvin. Kristopher Oppenheim 06.24.2024 12:28 PM   Karie Chimera, M.D., Ph.D. Diseases & Surgery of the Retina and Vitreous [facility] 12/10/2022   I have reviewed the above documentation for accuracy and completeness, and I agree with the above. Karie Chimera, M.D., Ph.D. 12/10/22 12:31 PM   Abbreviations: M myopia (nearsighted); A astigmatism; H hyperopia (farsighted); P presbyopia; Mrx spectacle prescription;  CTL contact lenses; OD right eye; OS left eye; OU both eyes  XT exotropia; ET esotropia; PEK punctate epithelial keratitis; PEE punctate epithelial erosions; DES dry eye syndrome; MGD meibomian gland dysfunction; ATs artificial tears; PFAT's preservative free artificial tears; NSC nuclear sclerotic cataract; PSC posterior subcapsular cataract; ERM epi-retinal membrane; PVD posterior vitreous detachment; RD retinal detachment; DM diabetes mellitus; DR diabetic retinopathy; NPDR non-proliferative diabetic retinopathy; PDR proliferative diabetic retinopathy; CSME clinically significant macular edema; DME diabetic macular edema; dbh dot blot hemorrhages; CWS cotton wool spot; POAG primary open angle glaucoma; C/D cup-to-disc ratio; HVF humphrey visual field; GVF goldmann visual field; OCT optical coherence tomography; IOP intraocular pressure; BRVO Branch retinal vein occlusion; CRVO  central retinal vein occlusion; CRAO central retinal artery occlusion; BRAO branch retinal artery occlusion; RT retinal tear; SB scleral buckle; PPV pars plana vitrectomy; VH Vitreous hemorrhage; PRP panretinal laser photocoagulation; IVK intravitreal kenalog; VMT vitreomacular traction; MH Macular hole;  NVD neovascularization of the disc; NVE neovascularization elsewhere; AREDS age related eye disease study; ARMD age related macular degeneration; POAG primary open angle glaucoma; EBMD epithelial/anterior basement membrane dystrophy; ACIOL anterior chamber intraocular lens; IOL intraocular lens; PCIOL posterior chamber intraocular lens; Phaco/IOL phacoemulsification with intraocular lens placement; PRK photorefractive keratectomy; LASIK laser assisted in situ keratomileusis; HTN hypertension; DM diabetes mellitus; COPD chronic obstructive pulmonary disease

## 2022-12-03 NOTE — Patient Instructions (Signed)
Your procedure is scheduled on: 12/07/2022  Report to [facility] Main Entrance at  745   AM.  Call this number if you have problems the morning of surgery: [phone]   Remember:              Follow Directions on the letter you received from Your Physician's office regarding the Bowel Prep             Hold Trulicity for 7 days prior to procedure            Hold Farxiga for 3 days prior to procedure              No Smoking the day of Procedure   :Take only 1/2 dose of Toujeo 20 units the night before procedure              No Diabetic medication am of procedure    Take these medicines the morning of surgery with A SIP OF WATER: Amlodipine and Levothyroxine    Do not wear jewelry, make-up or nail polish.    Do not bring valuables to the hospital.  Contacts, dentures or bridgework may not be worn into surgery.  .   Patients discharged the day of surgery will not be allowed to drive home.     Colonoscopy, Adult, Care After This sheet gives you information about how to care for yourself after your procedure. Your health care provider may also give you more specific instructions. If you have problems or questions, contact your health care provider. What can I expect after the procedure? After the procedure, it is common to have: A small amount of blood in your stool for 24 hours after the procedure. Some gas. Mild abdominal cramping or bloating.  Follow these instructions at home: General instructions  For the first 24 hours after the procedure: Do not drive or use machinery. Do not sign important documents. Do not drink alcohol. Do your regular daily activities at a slower pace than normal. Eat soft, easy-to-digest foods. Rest often. Take over-the-counter or prescription medicines only as told by your health care provider. It is up to you to get the results of your procedure. Ask your health care provider, or the department performing the procedure, when your results  will be ready. Relieving cramping and bloating Try walking around when you have cramps or feel bloated. Apply heat to your abdomen as told by your health care provider. Use a heat source that your health care provider recommends, such as a moist heat pack or a heating pad. Place a towel between your skin and the heat source. Leave the heat on for 20-30 minutes. Remove the heat if your skin turns bright red. This is especially important if you are unable to feel pain, heat, or cold. You may have a greater risk of getting burned. Eating and drinking Drink enough fluid to keep your urine clear or pale yellow. Resume your normal diet as instructed by your health care provider. Avoid heavy or fried foods that are hard to digest. Avoid drinking alcohol for as long as instructed by your health care provider. Contact a health care provider if: You have blood in your stool 2-3 days after the procedure. Get help right away if: You have more than a small spotting of blood in your stool. You pass large blood clots in your stool. Your abdomen is swollen. You have nausea or vomiting. You have a fever. You have increasing abdominal pain that is not  relieved with medicine. This information is not intended to replace advice given to you by your health care provider. Make sure you discuss any questions you have with your health care provider. Document Released: 01/10/2004 Document Revised: 02/20/2016 Document Reviewed: 08/09/2015 Elsevier Interactive Patient Education  [employer].

## 2022-12-05 ENCOUNTER — Encounter (HOSPITAL_COMMUNITY)
Admission: RE | Admit: 2022-12-05 | Discharge: 2022-12-05 | Disposition: A | Discharge status: Home / Self Care | Payer: Medicare Other | Source: Ambulatory Visit | Attending: Internal Medicine | Admitting: Internal Medicine

## 2022-12-05 ENCOUNTER — Telehealth: Payer: Self-pay | Admitting: *Deleted

## 2022-12-05 ENCOUNTER — Encounter (HOSPITAL_COMMUNITY): Payer: Self-pay

## 2022-12-05 VITALS — BP 144/52 | HR 88 | Temp 98.4°F | Resp 18 | Ht 63.0 in | Wt 151.9 lb

## 2022-12-05 DIAGNOSIS — Z79899 Other long term (current) drug therapy: Secondary | ICD-10-CM | POA: Diagnosis not present

## 2022-12-05 DIAGNOSIS — Z01812 Encounter for preprocedural laboratory examination: Secondary | ICD-10-CM | POA: Diagnosis not present

## 2022-12-05 LAB — BASIC METABOLIC PANEL
Anion gap: 10 (ref 5–15)
BUN: 20 mg/dL (ref 8–23)
CO2: 24 mmol/L (ref 22–32)
Calcium: 9.3 mg/dL (ref 8.9–10.3)
Chloride: 99 mmol/L (ref 98–111)
Creatinine, Ser: 0.69 mg/dL (ref 0.44–1.00)
GFR, Estimated: >60 mL/min (ref >60)
Glucose, Bld: 151 mg/dL — ABNORMAL HIGH (ref 70–99)
Potassium: 3.9 mmol/L (ref 3.5–5.1)
Sodium: 133 mmol/L — ABNORMAL LOW (ref 135–145)

## 2022-12-05 NOTE — Progress Notes (Signed)
Patient took Trulicity on 12/02/22 and will need to reschedule.

## 2022-12-05 NOTE — Telephone Encounter (Signed)
Myra Rude, RN   Hi, Megan Schroeder. Ms. Olivar took her Trulicity on Sunday so will have to be rescheduled. She requested to be scheduled early in the day because of her diabetes.   Pt states she took her Trulicity because her sugar was high and that helps bring it down. Advised pt that provider is booking out until August and I don't have his schedule for that month. I will call to schedule her once providers schedule is out. Verbalized understanding

## 2022-12-07 ENCOUNTER — Encounter (HOSPITAL_COMMUNITY): Admission: RE | Payer: Self-pay | Source: Home / Self Care

## 2022-12-07 ENCOUNTER — Ambulatory Visit (HOSPITAL_COMMUNITY): Admission: RE | Admit: 2022-12-07 | Payer: Medicare Other | Source: Home / Self Care | Admitting: Internal Medicine

## 2022-12-07 ENCOUNTER — Encounter (INDEPENDENT_AMBULATORY_CARE_PROVIDER_SITE_OTHER): Payer: Medicare Other | Admitting: Ophthalmology

## 2022-12-07 SURGERY — COLONOSCOPY WITH PROPOFOL
Anesthesia: Monitor Anesthesia Care

## 2022-12-10 ENCOUNTER — Encounter (INDEPENDENT_AMBULATORY_CARE_PROVIDER_SITE_OTHER): Payer: Self-pay | Admitting: Ophthalmology

## 2022-12-10 ENCOUNTER — Ambulatory Visit (INDEPENDENT_AMBULATORY_CARE_PROVIDER_SITE_OTHER): Payer: Medicare Other | Admitting: Ophthalmology

## 2022-12-10 DIAGNOSIS — Z7985 Long-term (current) use of injectable non-insulin antidiabetic drugs: Secondary | ICD-10-CM | POA: Diagnosis not present

## 2022-12-10 DIAGNOSIS — E113313 Type 2 diabetes mellitus with moderate nonproliferative diabetic retinopathy with macular edema, bilateral: Secondary | ICD-10-CM | POA: Diagnosis not present

## 2022-12-10 DIAGNOSIS — I10 Essential (primary) hypertension: Secondary | ICD-10-CM

## 2022-12-10 DIAGNOSIS — Z794 Long term (current) use of insulin: Secondary | ICD-10-CM | POA: Diagnosis not present

## 2022-12-10 DIAGNOSIS — Z7984 Long term (current) use of oral hypoglycemic drugs: Secondary | ICD-10-CM | POA: Diagnosis not present

## 2022-12-10 DIAGNOSIS — Z961 Presence of intraocular lens: Secondary | ICD-10-CM | POA: Diagnosis not present

## 2022-12-10 DIAGNOSIS — H35033 Hypertensive retinopathy, bilateral: Secondary | ICD-10-CM | POA: Diagnosis not present

## 2022-12-10 DIAGNOSIS — H35373 Puckering of macula, bilateral: Secondary | ICD-10-CM | POA: Diagnosis not present

## 2022-12-10 MED ORDER — AFLIBERCEPT 2MG/0.05ML IZ SOLN FOR KALEIDOSCOPE
2.0000 mg | INTRAVITREAL | Status: AC | PRN
  Administered 2022-12-10: 2 mg via INTRAVITREAL

## 2022-12-26 NOTE — Telephone Encounter (Signed)
Called pt to reschedule, VM full, not able to leave VM.

## 2022-12-27 DIAGNOSIS — E785 Hyperlipidemia, unspecified: Secondary | ICD-10-CM | POA: Diagnosis not present

## 2022-12-27 DIAGNOSIS — E1165 Type 2 diabetes mellitus with hyperglycemia: Secondary | ICD-10-CM | POA: Diagnosis not present

## 2022-12-27 DIAGNOSIS — E039 Hypothyroidism, unspecified: Secondary | ICD-10-CM | POA: Diagnosis not present

## 2023-01-03 DIAGNOSIS — R809 Proteinuria, unspecified: Secondary | ICD-10-CM | POA: Diagnosis not present

## 2023-01-03 DIAGNOSIS — Z713 Dietary counseling and surveillance: Secondary | ICD-10-CM | POA: Diagnosis not present

## 2023-01-03 DIAGNOSIS — Z1211 Encounter for screening for malignant neoplasm of colon: Secondary | ICD-10-CM | POA: Diagnosis not present

## 2023-01-03 DIAGNOSIS — Z79899 Other long term (current) drug therapy: Secondary | ICD-10-CM | POA: Diagnosis not present

## 2023-01-03 DIAGNOSIS — Z6824 Body mass index (BMI) 24.0-24.9, adult: Secondary | ICD-10-CM | POA: Diagnosis not present

## 2023-01-03 DIAGNOSIS — E1165 Type 2 diabetes mellitus with hyperglycemia: Secondary | ICD-10-CM | POA: Diagnosis not present

## 2023-01-03 DIAGNOSIS — E785 Hyperlipidemia, unspecified: Secondary | ICD-10-CM | POA: Diagnosis not present

## 2023-01-03 DIAGNOSIS — I700 Atherosclerosis of aorta: Secondary | ICD-10-CM | POA: Diagnosis not present

## 2023-01-03 DIAGNOSIS — I10 Essential (primary) hypertension: Secondary | ICD-10-CM | POA: Diagnosis not present

## 2023-01-03 DIAGNOSIS — E039 Hypothyroidism, unspecified: Secondary | ICD-10-CM | POA: Diagnosis not present

## 2023-01-15 DIAGNOSIS — B351 Tinea unguium: Secondary | ICD-10-CM | POA: Diagnosis not present

## 2023-01-15 DIAGNOSIS — L84 Corns and callosities: Secondary | ICD-10-CM | POA: Diagnosis not present

## 2023-01-15 DIAGNOSIS — E1142 Type 2 diabetes mellitus with diabetic polyneuropathy: Secondary | ICD-10-CM | POA: Diagnosis not present

## 2023-01-15 DIAGNOSIS — M79676 Pain in unspecified toe(s): Secondary | ICD-10-CM | POA: Diagnosis not present

## 2023-01-17 NOTE — Progress Notes (Signed)
. [facility] - Clinic Note  01/21/2023     CHIEF COMPLAINT Patient presents for Retina Follow Up  HISTORY OF PRESENT ILLNESS: Megan Schroeder is a 77 y.o. female who presents to the clinic today for:   HPI     Retina Follow Up   Patient presents with  Diabetic Retinopathy.  In both eyes.  This started 6 weeks ago.  I, the attending physician,  performed the HPI with the patient and updated documentation appropriately.        Comments   Patient here for 6 weeks retina follow up for NPDR OU. Patient states vision is good. No changes. No eye pain. Still using drops. Needs more of red top. Doctor stopped Trulicity and added [employer].      Last edited by Rennis Chris, MD on 01/21/2023  9:02 AM.    Patient states vision is doing well  Referring physician: Lupita Raider, NP [address] [place],  [place] [address]  HISTORICAL INFORMATION:   Selected notes from the MEDICAL RECORD NUMBER Referred by Dr. Laural Benes   CURRENT MEDICATIONS: Current Outpatient Medications (Ophthalmic Drugs)  Medication Sig   Bromfenac Sodium (PROLENSA) 0.07 % SOLN PLACE 1 DROP IN THE RIGHT EYE FOUR TIMES DAILY (Patient taking differently: Place 1 drop into the right eye 2 (two) times daily.)   prednisoLONE acetate (PRED FORTE) 1 % ophthalmic suspension INSTILL 1 DROP IN THE RIGHT EYE FOUR TIMES DAILY (Patient taking differently: Place 1 drop into the right eye in the morning and at bedtime.)   No current facility-administered medications for this visit. (Ophthalmic Drugs)   Current Outpatient Medications (Other)  Medication Sig   amLODipine (NORVASC) 10 MG tablet Take 1 tablet (10 mg total) by mouth daily. For BP   Ascorbic Acid (VITAMIN C) 1000 MG tablet Take 1,000 mg by mouth daily.   atorvastatin (LIPITOR) 40 MG tablet Take 40 mg by mouth daily.   calcium carbonate (OS-CAL) 600 MG tablet Take 600 mg by mouth daily.   cholecalciferol (VITAMIN D) 1000 UNITS tablet Take 1,000  Units by mouth daily.    Coenzyme Q10 (COQ-10) 200 MG CAPS Take 200 mg by mouth daily.    dapagliflozin propanediol (FARXIGA) 10 MG TABS tablet Take 1 tablet (10 mg total) by mouth daily.   GNP ULTICARE PEN NEEDLES 31G X 5 MM MISC See admin instructions.   hydrochlorothiazide (HYDRODIURIL) 12.5 MG tablet Take 12.5 mg by mouth daily.   levothyroxine (SYNTHROID) 75 MCG tablet Take 1 tablet (75 mcg total) by mouth daily before breakfast.   lisinopril (ZESTRIL) 40 MG tablet Take 40 mg by mouth daily.   Na Sulfate-K Sulfate-Mg Sulf 17.5-3.13-1.6 GM/177ML SOLN As directed   TOUJEO SOLOSTAR 300 UNIT/ML SOPN Inject 40 Units into the skin at bedtime.   aspirin EC 81 MG tablet Take 1 tablet (81 mg total) by mouth daily with breakfast. (Patient not taking: Reported on 11/29/2022)   Dulaglutide (TRULICITY) 4.5 MG/0.5ML SOPN Inject 4.5 mg into the skin every Sunday. (Patient not taking: Reported on 01/21/2023)   No current facility-administered medications for this visit. (Other)   REVIEW OF SYSTEMS: ROS   Positive for: Gastrointestinal, Endocrine, Cardiovascular, Eyes Negative for: Constitutional, Neurological, Skin, Genitourinary, Musculoskeletal, HENT, Respiratory, Psychiatric, Allergic/Imm, Heme/Lymph Last edited by Laddie Aquas, COA on 01/21/2023  7:53 AM.     ALLERGIES Allergies  Allergen Reactions   Penicillins Hives        Hydrocodone Nausea And Vomiting  Sulfa Antibiotics Rash   PAST MEDICAL HISTORY Past Medical History:  Diagnosis Date   Anxiety    CAD (coronary artery disease)    Diabetic retinopathy (HCC)    NPDR OU   Hyperlipidemia    Hypertension    Hypertensive retinopathy    OU   Hypothyroidism    Type 2 diabetes mellitus (HCC)    Past Surgical History:  Procedure Laterality Date   BREAST EXCISIONAL BIOPSY Left    CATARACT EXTRACTION W/PHACO Left 12/16/2017   Procedure: CATARACT EXTRACTION PHACO AND INTRAOCULAR LENS PLACEMENT (IOC);  Surgeon: Gemma Payor, MD;   Location: [facility];  Service: Ophthalmology;  Laterality: Left;  CDE: 11.65   CATARACT EXTRACTION W/PHACO Right 12/30/2017   Procedure: CATARACT EXTRACTION PHACO AND INTRAOCULAR LENS PLACEMENT RIGHT EYE;  Surgeon: Gemma Payor, MD;  Location: [facility];  Service: Ophthalmology;  Laterality: Right;  CDE: 9.07   COLONOSCOPY N/A 09/15/2015   Dr. Geni Bers multiple rectal and colonic polyps removed. Hepatic flexure with 9 mm polyp, multiple 5-7 mm polyps. Multiple cecal polyps with largest 1.5 cm, one 5 mm polyp in rectum. Path for colonic polyps with sessile serrated polyp without dysplasia, no high grade dysplasia, tubular adenomas, rectal hyperplastic polyp   COLONOSCOPY WITH PROPOFOL N/A 07/20/2019   multiple tubular adenomas. diverticulosis. 3 year surveillance   EYE SURGERY Bilateral 2019   Cat Sx - Dr. Gemma Payor   LEFT HEART CATHETERIZATION WITH CORONARY ANGIOGRAM N/A 07/23/2011   Procedure: LEFT HEART CATHETERIZATION WITH CORONARY ANGIOGRAM;  Surgeon: Wendall Stade, MD;  Location: [facility] CATH LAB;  Service: Cardiovascular;  Laterality: N/A;   POLYPECTOMY  07/20/2019   Procedure: POLYPECTOMY;  Surgeon: Corbin Ade, MD;  Location: [facility] SUITE;  Service: Endoscopy;;   YAG LASER APPLICATION Right 09/01/2020   Dr. Rennis Chris   YAG LASER APPLICATION Left 09/01/2020   Dr. Rennis Chris   FAMILY HISTORY Family History  Problem Relation Age of Onset   Cancer Father    Diabetes Father    Colon cancer Sister    SOCIAL HISTORY Social History   Tobacco Use   Smoking status: Never   Smokeless tobacco: Never  Vaping Use   Vaping status: Never Used  Substance Use Topics   Alcohol use: No   Drug use: No       OPHTHALMIC EXAM:  Base Eye Exam     Visual Acuity (Snellen - Linear)       Right Left   Dist cc 20/25 -2 20/20    Correction: Glasses         Tonometry (Tonopen, 7:51 AM)       Right Left   Pressure 19 17         Pupils       Dark Light Shape React APD   Right 3 2  Round Brisk None   Left 3 2 Round Brisk None         Visual Fields (Counting fingers)       Left Right    Full Full         Extraocular Movement       Right Left    Full, Ortho Full, Ortho         Neuro/Psych     Oriented x3: Yes   Mood/Affect: Normal         Dilation     Both eyes: 1.0% Mydriacyl, 2.5% Phenylephrine @ 7:50 AM           Slit  Lamp and Fundus Exam     Slit Lamp Exam       Right Left   Lids/Lashes Dermatochalasis - upper lid, mild MGD Dermatochalasis - upper lid, mild MGD   Conjunctiva/Sclera white and quiet white and quiet   Cornea 1-2+PEE, well healed temporal cataract wound 1+PEE, well healed temporal cataract wound   Anterior Chamber deep, clear, narrow temporal angle deep and clear   Iris round and dilated, No NVI round and dilated, No NVI   Lens PC IOL in good position, open PC PC IOL in good position with open PC   Anterior Vitreous syneresis, PVD syneresis, Posterior vitreous detachment, vitreous condensations         Fundus Exam       Right Left   Disc pink and sharp, compact, mild PPA pink and sharp, compact, mild PPP   C/D Ratio 0.3 0.1   Macula flat, blunted foveal reflex, ERM with striae inferiorly, punctate IRH, +central cyst / edema, no exudates, focal CWS superior macula flat, blunted foveal reflex, mild ERM, rare MA, no edema, focal pigment clumping temporal to fovea   Vessels attenuated, tortuous greatest IT arcades attenuated, tortuous   Periphery attached, scattered MA / DBH attached, rare MA/DBH, no RT/RD 360           Refraction     Wearing Rx       Sphere Cylinder Axis Add   Right [place]   +2.50   Left -0.25 +0.50 177 +2.50           IMAGING AND PROCEDURES  Imaging and Procedures for 01/21/2023  OCT, Retina - OU - Both Eyes       Right Eye Quality was borderline. Central Foveal Thickness: 481. Progression has been stable. Findings include no SRF, abnormal foveal contour, epiretinal membrane,  intraretinal fluid, macular pucker (ERM with pucker stable; mild persistent IRF and central cyst -- slightly improved).   Left Eye Quality was good. Central Foveal Thickness: 258. Progression has been stable. Findings include normal foveal contour, no IRF, no SRF, epiretinal membrane, macular pucker.   Notes *Images captured and stored on drive  Diagnosis / Impression:  OD: ERM with pucker stable; mild persistent IRF and central cyst -- slightly improved OS: mild ERM with early pucker, partial PVD  Clinical management:  See below  Abbreviations: NFP - Normal foveal profile. CME - cystoid macular edema. PED - pigment epithelial detachment. IRF - intraretinal fluid. SRF - subretinal fluid. EZ - ellipsoid zone. ERM - epiretinal membrane. ORA - outer retinal atrophy. ORT - outer retinal tubulation. SRHM - subretinal hyper-reflective material. IRHM - intraretinal hyper-reflective material      Intravitreal Injection, Pharmacologic Agent - OD - Right Eye       Time Out 01/21/2023. 7:57 AM. Confirmed correct patient, procedure, site, and patient consented.   Anesthesia Topical anesthesia was used. Anesthetic medications included Lidocaine 2%, Proparacaine 0.5%.   Procedure Preparation included 5% betadine to ocular surface, eyelid speculum. A (32g) needle was used.   Injection: 2 mg aflibercept 2 MG/0.05ML   Route: Intravitreal, Site: Right Eye   NDC: [id number], Lot: [id number], Expiration date: 09/09/2023, Waste: 0 mL   Post-op Post injection exam found visual acuity of at least counting fingers. The patient tolerated the procedure well. There were no complications. The patient received written and verbal post procedure care education. Post injection medications were not given.            ASSESSMENT/PLAN:    ICD-10-CM  1. Epiretinal membrane (ERM) of both eyes  H35.373 OCT, Retina - OU - Both Eyes    2. Moderate nonproliferative diabetic retinopathy of both eyes with  macular edema associated with type 2 diabetes mellitus (HCC)  E11.3313 OCT, Retina - OU - Both Eyes    Intravitreal Injection, Pharmacologic Agent - OD - Right Eye    aflibercept (EYLEA) SOLN 2 mg    3. Current use of insulin (HCC)  Z79.4     4. Long term (current) use of oral hypoglycemic drugs  Z79.84     5. Long-term (current) use of injectable non-insulin antidiabetic drugs  Z79.85     6. Essential hypertension  I10     7. Hypertensive retinopathy of both eyes  H35.033     8. Pseudophakia of both eyes  Z96.1      1. Epiretinal membrane OU -- stable  - BCVA OD 20/30; OS 20/20 - OCT shows OD: ERM with pucker and prominent central cyst; persistent IRF and central cyst; OS: mild ERM with early pucker, partial PVD    - IRF OD: ?DME vs CME vs cystic changes - started PF and Prolensa QID OD only for possible CME component on 12.8.22 -- continue, but suspect DME may be significant component  - no metamorphopsia  - monitor for now  - f/u 6 weeks -- DFE/OCT  2-5. Moderate nonproliferative diabetic retinopathy OU - s/p IVA OD #1 (02.16.23), #2 (03.20.23), #3 (04.17.23), #4 (05.22.23), #5 (06.22.23), #6 (07.27.23),  #7 (09.01.23) #8 (10.06.23), #9 (11.09.23), #10 (12.15.23) #11 (01.12.24), #12 (02.09.24) #13 (03.15.24) - s/p IVE OD #1 (04.18.24), #2 (05.24.24), #3 (07.01.24) - BCVA OD: stable at 20/25, OS: 20/20  - exam shows scattered MA, no NV - OCT shows OD: ERM with pucker stable; mild persistent IRF and central cyst -- slightly improved; OS: mild ERM with early pucker, partial PVD at 6 weeks - recommend IVE OD #4 today, 08.12.24 for DME component w/ f/u in 6 wks - pt wishes to proceed - RBA of procedure discussed, questions answered - IVE informed consent obtained and signed, 04.19.24 (OD)  - have obtained Eylea approval for 2024 - see procedure note  - f/u in 6 wks - DFE/OCT, possible injxn  6,7. Hypertensive retinopathy OU - discussed importance of tight BP control -  monitor   8. Pseudophakia OU  - s/p CE/IOL OU, 2019 (Dr. Alto [place])  - IOLs in good position, doing well  - s/p YAG cap OD 3.24.22  - s/p YAG cap OS 04.07.22  - monitor  Ophthalmic Meds Ordered this visit:  Meds ordered this encounter  Medications   aflibercept (EYLEA) SOLN 2 mg     Return in about 6 weeks (around 03/04/2023) for f/u NPDR OU, DFE, OCT.  There are no Patient Instructions on file for this visit.  This document serves as a record of services personally performed by Karie Chimera, MD, PhD. It was created on their behalf by Annalee Genta, COMT. The creation of this record is the provider's dictation and/or activities during the visit.  Electronically signed by: Annalee Genta, COMT 01/21/23 9:04 AM  This document serves as a record of services personally performed by Karie Chimera, MD, PhD. It was created on their behalf by Glee Arvin. Manson Passey, OA an ophthalmic technician. The creation of this record is the provider's dictation and/or activities during the visit.    Electronically signed by: Glee Arvin. Manson Passey, OA 01/21/23 9:04 AM  Karie Chimera, M.D., Ph.D. Diseases &  Surgery of the Retina and Vitreous [facility] [place] 01/21/2023   I have reviewed the above documentation for accuracy and completeness, and I agree with the above. Karie Chimera, M.D., Ph.D. 01/21/23 9:04 AM  Abbreviations: M myopia (nearsighted); A astigmatism; H hyperopia (farsighted); P presbyopia; Mrx spectacle prescription;  CTL contact lenses; OD right eye; OS left eye; OU both eyes  XT exotropia; ET esotropia; PEK punctate epithelial keratitis; PEE punctate epithelial erosions; DES dry eye syndrome; MGD meibomian gland dysfunction; ATs artificial tears; PFAT's preservative free artificial tears; NSC nuclear sclerotic cataract; PSC posterior subcapsular cataract; ERM epi-retinal membrane; PVD posterior vitreous detachment; RD retinal detachment; DM diabetes mellitus; DR diabetic  retinopathy; NPDR non-proliferative diabetic retinopathy; PDR proliferative diabetic retinopathy; CSME clinically significant macular edema; DME diabetic macular edema; dbh dot blot hemorrhages; CWS cotton wool spot; POAG primary open angle glaucoma; C/D cup-to-disc ratio; HVF humphrey visual field; GVF goldmann visual field; OCT optical coherence tomography; IOP intraocular pressure; BRVO Branch retinal vein occlusion; CRVO central retinal vein occlusion; CRAO central retinal artery occlusion; BRAO branch retinal artery occlusion; RT retinal tear; SB scleral buckle; PPV pars plana vitrectomy; VH Vitreous hemorrhage; PRP panretinal laser photocoagulation; IVK intravitreal kenalog; VMT vitreomacular traction; MH Macular hole;  NVD neovascularization of the disc; NVE neovascularization elsewhere; AREDS age related eye disease study; ARMD age related macular degeneration; POAG primary open angle glaucoma; EBMD epithelial/anterior basement membrane dystrophy; ACIOL anterior chamber intraocular lens; IOL intraocular lens; PCIOL posterior chamber intraocular lens; Phaco/IOL phacoemulsification with intraocular lens placement; PRK photorefractive keratectomy; LASIK laser assisted in situ keratomileusis; HTN hypertension; DM diabetes mellitus; COPD chronic obstructive pulmonary disease

## 2023-01-21 ENCOUNTER — Ambulatory Visit (INDEPENDENT_AMBULATORY_CARE_PROVIDER_SITE_OTHER): Payer: Medicare Other | Admitting: Ophthalmology

## 2023-01-21 ENCOUNTER — Encounter (INDEPENDENT_AMBULATORY_CARE_PROVIDER_SITE_OTHER): Payer: Self-pay | Admitting: Ophthalmology

## 2023-01-21 DIAGNOSIS — I10 Essential (primary) hypertension: Secondary | ICD-10-CM

## 2023-01-21 DIAGNOSIS — Z7984 Long term (current) use of oral hypoglycemic drugs: Secondary | ICD-10-CM | POA: Diagnosis not present

## 2023-01-21 DIAGNOSIS — H35373 Puckering of macula, bilateral: Secondary | ICD-10-CM

## 2023-01-21 DIAGNOSIS — Z7985 Long-term (current) use of injectable non-insulin antidiabetic drugs: Secondary | ICD-10-CM | POA: Diagnosis not present

## 2023-01-21 DIAGNOSIS — Z794 Long term (current) use of insulin: Secondary | ICD-10-CM

## 2023-01-21 DIAGNOSIS — Z961 Presence of intraocular lens: Secondary | ICD-10-CM

## 2023-01-21 DIAGNOSIS — E113313 Type 2 diabetes mellitus with moderate nonproliferative diabetic retinopathy with macular edema, bilateral: Secondary | ICD-10-CM | POA: Diagnosis not present

## 2023-01-21 DIAGNOSIS — H35033 Hypertensive retinopathy, bilateral: Secondary | ICD-10-CM | POA: Diagnosis not present

## 2023-01-21 MED ORDER — AFLIBERCEPT 2MG/0.05ML IZ SOLN FOR KALEIDOSCOPE
2.0000 mg | INTRAVITREAL | Status: AC | PRN
Start: 2023-01-21 — End: 2023-01-21
  Administered 2023-01-21: 2 mg via INTRAVITREAL

## 2023-01-22 ENCOUNTER — Encounter: Payer: Self-pay | Admitting: *Deleted

## 2023-01-22 NOTE — Telephone Encounter (Addendum)
CALLED PT. She has been scheduled for 9/11. Aware will send new instructions/pre-op appt. Aware to hold medications.  She is also now on moujaro and not trulicity. Already has prep still too

## 2023-02-18 ENCOUNTER — Encounter (HOSPITAL_COMMUNITY)
Admission: RE | Admit: 2023-02-18 | Discharge: 2023-02-18 | Disposition: A | Discharge status: Home / Self Care | Payer: Medicare Other | Source: Ambulatory Visit | Attending: Internal Medicine | Admitting: Internal Medicine

## 2023-02-20 ENCOUNTER — Other Ambulatory Visit: Payer: Self-pay

## 2023-02-20 ENCOUNTER — Encounter (HOSPITAL_COMMUNITY): Payer: Self-pay | Admitting: Internal Medicine

## 2023-02-20 ENCOUNTER — Ambulatory Visit (HOSPITAL_COMMUNITY): Payer: Medicare Other | Admitting: Anesthesiology

## 2023-02-20 ENCOUNTER — Ambulatory Visit (HOSPITAL_COMMUNITY)
Admission: RE | Admit: 2023-02-20 | Discharge: 2023-02-20 | Disposition: A | Discharge status: Home / Self Care | Payer: Medicare Other | Attending: Internal Medicine | Admitting: Internal Medicine

## 2023-02-20 ENCOUNTER — Encounter (HOSPITAL_COMMUNITY)
Admission: RE | Disposition: A | Discharge status: Home / Self Care | Payer: Self-pay | Source: Home / Self Care | Attending: Internal Medicine

## 2023-02-20 DIAGNOSIS — Z7984 Long term (current) use of oral hypoglycemic drugs: Secondary | ICD-10-CM | POA: Diagnosis not present

## 2023-02-20 DIAGNOSIS — I10 Essential (primary) hypertension: Secondary | ICD-10-CM | POA: Insufficient documentation

## 2023-02-20 DIAGNOSIS — D122 Benign neoplasm of ascending colon: Secondary | ICD-10-CM | POA: Diagnosis not present

## 2023-02-20 DIAGNOSIS — E039 Hypothyroidism, unspecified: Secondary | ICD-10-CM | POA: Diagnosis not present

## 2023-02-20 DIAGNOSIS — Z800 Family history of malignant neoplasm of digestive organs: Secondary | ICD-10-CM | POA: Insufficient documentation

## 2023-02-20 DIAGNOSIS — D124 Benign neoplasm of descending colon: Secondary | ICD-10-CM | POA: Diagnosis not present

## 2023-02-20 DIAGNOSIS — K635 Polyp of colon: Secondary | ICD-10-CM | POA: Diagnosis not present

## 2023-02-20 DIAGNOSIS — Z09 Encounter for follow-up examination after completed treatment for conditions other than malignant neoplasm: Secondary | ICD-10-CM | POA: Diagnosis not present

## 2023-02-20 DIAGNOSIS — I2510 Atherosclerotic heart disease of native coronary artery without angina pectoris: Secondary | ICD-10-CM | POA: Insufficient documentation

## 2023-02-20 DIAGNOSIS — Z86010 Personal history of colonic polyps: Secondary | ICD-10-CM | POA: Diagnosis not present

## 2023-02-20 DIAGNOSIS — Z1211 Encounter for screening for malignant neoplasm of colon: Secondary | ICD-10-CM | POA: Diagnosis not present

## 2023-02-20 DIAGNOSIS — D120 Benign neoplasm of cecum: Secondary | ICD-10-CM | POA: Diagnosis not present

## 2023-02-20 DIAGNOSIS — E119 Type 2 diabetes mellitus without complications: Secondary | ICD-10-CM | POA: Diagnosis not present

## 2023-02-20 DIAGNOSIS — D123 Benign neoplasm of transverse colon: Secondary | ICD-10-CM | POA: Diagnosis not present

## 2023-02-20 HISTORY — PX: POLYPECTOMY: SHX149

## 2023-02-20 HISTORY — PX: COLONOSCOPY WITH PROPOFOL: SHX5780

## 2023-02-20 LAB — GLUCOSE, CAPILLARY: Glucose-Capillary: 108 mg/dL — ABNORMAL HIGH (ref 70–99)

## 2023-02-20 SURGERY — COLONOSCOPY WITH PROPOFOL
Anesthesia: General

## 2023-02-20 MED ORDER — EPHEDRINE SULFATE-NACL 50-0.9 MG/10ML-% IV SOSY
PREFILLED_SYRINGE | INTRAVENOUS | Status: DC | PRN
Start: 2023-02-20 — End: 2023-02-20
  Administered 2023-02-20: 10 mg via INTRAVENOUS

## 2023-02-20 MED ORDER — PROPOFOL 500 MG/50ML IV EMUL
INTRAVENOUS | Status: DC | PRN
  Administered 2023-02-20: 150 ug/kg/min via INTRAVENOUS

## 2023-02-20 MED ORDER — GLYCOPYRROLATE PF 0.2 MG/ML IJ SOSY
PREFILLED_SYRINGE | INTRAMUSCULAR | Status: AC
  Filled 2023-02-20: qty 1

## 2023-02-20 MED ORDER — GLYCOPYRROLATE PF 0.2 MG/ML IJ SOSY
PREFILLED_SYRINGE | INTRAMUSCULAR | Status: AC
  Filled 2023-02-20: qty 3

## 2023-02-20 MED ORDER — GLYCOPYRROLATE PF 0.2 MG/ML IJ SOSY
PREFILLED_SYRINGE | INTRAMUSCULAR | Status: DC | PRN
Start: 2023-02-20 — End: 2023-02-20
  Administered 2023-02-20: .1 mg via INTRAVENOUS

## 2023-02-20 MED ORDER — LACTATED RINGERS IV SOLN: INTRAVENOUS | Status: DC

## 2023-02-20 MED ORDER — EPHEDRINE 5 MG/ML INJ
INTRAVENOUS | Status: AC
  Filled 2023-02-20: qty 5

## 2023-02-20 MED ORDER — LACTATED RINGERS IV SOLN
INTRAVENOUS | Status: DC | PRN
Start: 2023-02-20 — End: 2023-02-20

## 2023-02-20 MED ORDER — LIDOCAINE HCL (CARDIAC) PF 100 MG/5ML IV SOSY
PREFILLED_SYRINGE | INTRAVENOUS | Status: DC | PRN
  Administered 2023-02-20: 80 mg via INTRAVENOUS

## 2023-02-20 MED ORDER — LIDOCAINE HCL (PF) 2 % IJ SOLN
INTRAMUSCULAR | Status: AC
  Filled 2023-02-20: qty 20

## 2023-02-20 MED ORDER — PHENYLEPHRINE 80 MCG/ML (10ML) SYRINGE FOR IV PUSH (FOR BLOOD PRESSURE SUPPORT)
PREFILLED_SYRINGE | INTRAVENOUS | Status: DC | PRN
Start: 2023-02-20 — End: 2023-02-20
  Administered 2023-02-20 (×3): 80 ug via INTRAVENOUS

## 2023-02-20 MED ORDER — PROPOFOL 1000 MG/100ML IV EMUL
INTRAVENOUS | Status: AC
  Filled 2023-02-20: qty 100

## 2023-02-20 MED ORDER — PROPOFOL 10 MG/ML IV BOLUS
INTRAVENOUS | Status: DC | PRN
  Administered 2023-02-20: 80 mg via INTRAVENOUS
  Administered 2023-02-20: 40 mg via INTRAVENOUS

## 2023-02-20 MED ORDER — PHENYLEPHRINE 80 MCG/ML (10ML) SYRINGE FOR IV PUSH (FOR BLOOD PRESSURE SUPPORT)
PREFILLED_SYRINGE | INTRAVENOUS | Status: AC
  Filled 2023-02-20: qty 10

## 2023-02-20 NOTE — Op Note (Signed)
[facility] Patient Name: Megan Schroeder Procedure Date: 02/20/2023 7:10 AM MRN: [id number] Date of Birth: [birth year] Attending MD: Gennette Pac , MD, [id number] CSN: [id number] Age: 77 Admit Type: Outpatient Procedure:                Colonoscopy Indications:              High risk colon cancer surveillance: Personal                            history of colonic polyps Providers:                Gennette Pac, MD, Crystal Page, Dyann Ruddle, Elinor Parkinson Referring MD:              Medicines:                Propofol per Anesthesia Complications:            No immediate complications. Estimated Blood Loss:     Estimated blood loss was minimal. Estimated blood                            loss was minimal. Procedure:                Pre-Anesthesia Assessment:                           - Prior to the procedure, a History and Physical                            was performed, and patient medications and                            allergies were reviewed. The patient's tolerance of                            previous anesthesia was also reviewed. The risks                            and benefits of the procedure and the sedation                            options and risks were discussed with the patient.                            All questions were answered, and informed consent                            was obtained. Prior Anticoagulants: The patient has                            taken no anticoagulant or antiplatelet agents. ASA  Grade Assessment: III - A patient with severe                            systemic disease. After reviewing the risks and                            benefits, the patient was deemed in satisfactory                            condition to undergo the procedure.                           After obtaining informed consent, the colonoscope                            was passed under direct  vision. Throughout the                            procedure, the patient's blood pressure, pulse, and                            oxygen saturations were monitored continuously. The                            [id number] scope was introduced through the                            anus and advanced to the the cecum, identified by                            appendiceal orifice and ileocecal valve. The                            colonoscopy was performed without difficulty. The                            patient tolerated the procedure well. The quality                            of the bowel preparation was adequate. The                            ileocecal valve, appendiceal orifice, and rectum                            were photographed. The colonoscopy was performed                            without difficulty. The patient tolerated the                            procedure well. The quality of the bowel  preparation was adequate. Scope In: 7:33:36 AM Scope Out: 8:01:29 AM Scope Withdrawal Time: 0 hours 19 minutes 51 seconds  Total Procedure Duration: 0 hours 27 minutes 53 seconds  Findings:      The perianal and digital rectal examinations were normal.      Scattered medium-mouthed diverticula were found in the entire colon.      Nine semi-pedunculated polyps were found in the descending colon,       transverse colon, ascending colon and cecum. The polyps were 3 to 8 mm       in size. These polyps were removed with a cold snare. Resection and       retrieval were complete. Estimated blood loss was minimal.      The exam was otherwise without abnormality on direct and retroflexion       views. Impression:               - Diverticulosis in the entire examined colon.                           - Nine 3 to 8 mm polyps in the descending colon, in                            the transverse colon, in the ascending colon and in                            the  cecum, removed with a cold snare. Resected and                            retrieved.                           - The examination was otherwise normal on direct                            and retroflexion views. Moderate Sedation:      Moderate (conscious) sedation was personally administered by an       anesthesia professional. The following parameters were monitored: oxygen       saturation, heart rate, blood pressure, respiratory rate, EKG, adequacy       of pulmonary ventilation, and response to care. Recommendation:           - Patient has a contact number available for                            emergencies. The signs and symptoms of potential                            delayed complications were discussed with the                            patient. Return to normal activities tomorrow.                            Written discharge instructions were provided to the  patient.                           - Advance diet as tolerated.                           - Continue present medications.                           - Repeat colonoscopy date to be determined after                            pending pathology results are reviewed for                            surveillance.                           - Return to GI office (date not yet determined). Procedure Code(s):        --- Professional ---                           [phone], Colonoscopy, flexible; with removal of                            tumor(s), polyp(s), or other lesion(s) by snare                            technique Diagnosis Code(s):        --- Professional ---                           Z86.010, Personal history of colonic polyps                           D12.4, Benign neoplasm of descending colon                           D12.3, Benign neoplasm of transverse colon (hepatic                            flexure or splenic flexure)                           D12.2, Benign neoplasm of ascending colon                            D12.0, Benign neoplasm of cecum                           K57.30, Diverticulosis of large intestine without                            perforation or abscess without bleeding CPT copyright 2022 [employer]. All rights reserved. The codes documented in this report are preliminary and upon coder review may  be revised to meet current compliance requirements. Gerrit Friends. Jena Gauss, MD  Gennette Pac, MD 02/20/2023 8:13:13 AM This report has been signed electronically. Number of Addenda: 0

## 2023-02-20 NOTE — Transfer of Care (Addendum)
Immediate Anesthesia Transfer of Care Note  Patient: Megan Schroeder  Procedure(s) Performed: COLONOSCOPY WITH PROPOFOL POLYPECTOMY INTESTINAL  Patient Location: Short Stay  Anesthesia Type:General  Level of Consciousness: drowsy and patient cooperative  Airway & Oxygen Therapy: Patient Spontanous Breathing and Patient connected to nasal cannula oxygen  Post-op Assessment: Report given to RN and Post -op Vital signs reviewed and stable  Post vital signs: Reviewed and stable  Last Vitals:  Vitals Value Taken Time  BP 128/59 02/20/23   0808  Temp 36.6 02/20/23   0808  Pulse 70 02/20/23   0808  Resp 16 02/20/23   0808  SpO2 97% 02/20/23   0808    Last Pain:  Vitals:   02/20/23 0729  TempSrc:   PainSc: 0-No pain      Patients Stated Pain Goal: 6 (02/20/23 0700)  Complications: No notable events documented.

## 2023-02-20 NOTE — H&P (Signed)
@LOGO @   Primary Care Physician:  Lupita Raider, NP Primary Gastroenterologist:  Dr. Jena Gauss  Pre-Procedure History & Physical: HPI:  Megan Schroeder is a 77 y.o. female here for for surveillance colonoscopy.  Patient has multiple colonic adenomas removed over time.  For colon adenomas removed 2021.  Positive family history colon cancer in her sister.  Past Medical History:  Diagnosis Date   Anxiety    CAD (coronary artery disease)    Diabetic retinopathy (HCC)    NPDR OU   Hyperlipidemia    Hypertension    Hypertensive retinopathy    OU   Hypothyroidism    Type 2 diabetes mellitus (HCC)     Past Surgical History:  Procedure Laterality Date   BREAST EXCISIONAL BIOPSY Left    CATARACT EXTRACTION W/PHACO Left 12/16/2017   Procedure: CATARACT EXTRACTION PHACO AND INTRAOCULAR LENS PLACEMENT (IOC);  Surgeon: Gemma Payor, MD;  Location: [facility];  Service: Ophthalmology;  Laterality: Left;  CDE: 11.65   CATARACT EXTRACTION W/PHACO Right 12/30/2017   Procedure: CATARACT EXTRACTION PHACO AND INTRAOCULAR LENS PLACEMENT RIGHT EYE;  Surgeon: Gemma Payor, MD;  Location: [facility];  Service: Ophthalmology;  Laterality: Right;  CDE: 9.07   COLONOSCOPY N/A 09/15/2015   Dr. Geni Bers multiple rectal and colonic polyps removed. Hepatic flexure with 9 mm polyp, multiple 5-7 mm polyps. Multiple cecal polyps with largest 1.5 cm, one 5 mm polyp in rectum. Path for colonic polyps with sessile serrated polyp without dysplasia, no high grade dysplasia, tubular adenomas, rectal hyperplastic polyp   COLONOSCOPY WITH PROPOFOL N/A 07/20/2019   multiple tubular adenomas. diverticulosis. 3 year surveillance   EYE SURGERY Bilateral 2019   Cat Sx - Dr. Gemma Payor   LEFT HEART CATHETERIZATION WITH CORONARY ANGIOGRAM N/A 07/23/2011   Procedure: LEFT HEART CATHETERIZATION WITH CORONARY ANGIOGRAM;  Surgeon: Wendall Stade, MD;  Location: [facility] CATH LAB;  Service: Cardiovascular;  Laterality: N/A;   POLYPECTOMY  07/20/2019    Procedure: POLYPECTOMY;  Surgeon: Corbin Ade, MD;  Location: [facility] SUITE;  Service: Endoscopy;;   YAG LASER APPLICATION Right 09/01/2020   Dr. Rennis Chris   YAG LASER APPLICATION Left 09/01/2020   Dr. Rennis Chris    Prior to Admission medications   Medication Sig Start Date End Date Taking? Authorizing Provider  amLODipine (NORVASC) 10 MG tablet Take 1 tablet (10 mg total) by mouth daily. For BP 05/17/20  Yes Emokpae, Courage, MD  Ascorbic Acid (VITAMIN C) 1000 MG tablet Take 1,000 mg by mouth daily.   Yes [provider]  atorvastatin (LIPITOR) 40 MG tablet Take 40 mg by mouth daily.   Yes [provider]  calcium carbonate (OS-CAL) 600 MG tablet Take 600 mg by mouth daily.   Yes [provider]  cholecalciferol (VITAMIN D) 1000 UNITS tablet Take 1,000 Units by mouth daily.    Yes [provider]  Coenzyme Q10 (COQ-10) 200 MG CAPS Take 200 mg by mouth daily.    Yes [provider]  hydrochlorothiazide (HYDRODIURIL) 12.5 MG tablet Take 12.5 mg by mouth daily.   Yes [provider]  levothyroxine (SYNTHROID) 75 MCG tablet Take 1 tablet (75 mcg total) by mouth daily before breakfast. 05/17/20  Yes Emokpae, Courage, MD  lisinopril (ZESTRIL) 40 MG tablet Take 40 mg by mouth daily.   Yes [provider]  Na Sulfate-K Sulfate-Mg Sulf 17.5-3.13-1.6 GM/177ML SOLN As directed 10/26/22  Yes Mitchelle Goerner, Gerrit Friends, MD  prednisoLONE acetate (PRED FORTE) 1 % ophthalmic suspension INSTILL  1 DROP IN THE RIGHT EYE FOUR TIMES DAILY Patient taking differently: Place 1 drop into the right eye in the morning and at bedtime. 06/06/22  Yes Rennis Chris, MD  aspirin EC 81 MG tablet Take 1 tablet (81 mg total) by mouth daily with breakfast. Patient not taking: Reported on 11/29/2022 05/17/20   Shon Hale, MD  Bromfenac Sodium (PROLENSA) 0.07 % SOLN PLACE 1 DROP IN THE RIGHT EYE FOUR TIMES DAILY Patient taking differently: Place 1 drop into the right  eye 2 (two) times daily. 02/09/22   Rennis Chris, MD  dapagliflozin propanediol (FARXIGA) 10 MG TABS tablet Take 1 tablet (10 mg total) by mouth daily. 05/17/20   Shon Hale, MD  Dulaglutide (TRULICITY) 4.5 MG/0.5ML SOPN Inject 4.5 mg into the skin every Sunday. Patient not taking: Reported on 01/21/2023    [provider]  GNP ULTICARE PEN NEEDLES 31G X 5 MM MISC See admin instructions. 09/07/20   [provider]  TOUJEO SOLOSTAR 300 UNIT/ML SOPN Inject 40 Units into the skin at bedtime. 06/17/19   [provider]    Allergies as of 01/22/2023 - Review Complete 01/21/2023  Allergen Reaction Noted   Penicillins Hives 07/21/2011   Hydrocodone Nausea And Vomiting 12/30/2017   Sulfa antibiotics Rash 09/25/2021    Family History  Problem Relation Age of Onset   Cancer Father    Diabetes Father    Colon cancer Sister     Social History   Socioeconomic History   Marital status: Widowed    Spouse name: Not on file   Number of children: Not on file   Years of education: Not on file   Highest education level: Not on file  Occupational History   Not on file  Tobacco Use   Smoking status: Never   Smokeless tobacco: Never  Vaping Use   Vaping status: Never Used  Substance and Sexual Activity   Alcohol use: No   Drug use: No   Sexual activity: Yes    Birth control/protection: Post-menopausal  Other Topics Concern   Not on file  Social History Narrative   Not on file   Social Determinants of Health   Financial Resource Strain: Not on file  Food Insecurity: Not on file  Transportation Needs: Not on file  Physical Activity: Not on file  Stress: Not on file  Social Connections: Not on file  Intimate Partner Violence: Not on file    Review of Systems: See HPI, otherwise negative ROS  Physical Exam: BP (!) 140/60   Pulse 76   Temp 98.1 F (36.7 C) (Oral)   Resp 12   Ht 5\' 3"  (1.6 m)   Wt 65.8 kg   SpO2 100%   BMI 25.69 kg/m  General:    Alert,  Well-developed, well-nourished, pleasant and cooperative in NAD Lungs:  Clear throughout to auscultation.   No wheezes, crackles, or rhonchi. No acute distress. Heart:  Regular rate and rhythm; no murmurs, clicks, rubs,  or gallops. Abdomen: Non-distended, normal bowel sounds.  Soft and nontender without appreciable mass or hepatosplenomegaly.   Impression/Plan: 77 year old lady with multiple colonic polyps removed over time.  Positive family history colon cancer.  Here for surveillance colonoscopy per plan. The risks, benefits, limitations, alternatives and imponderables have been reviewed with the patient. Questions have been answered. All parties are agreeable.       Notice: This dictation was prepared with Dragon dictation along with smaller phrase technology. Any transcriptional errors that result from this process  are unintentional and may not be corrected upon review.

## 2023-02-20 NOTE — Discharge Instructions (Signed)
  Colonoscopy Discharge Instructions  Read the instructions outlined below and refer to this sheet in the next few weeks. These discharge instructions provide you with general information on caring for yourself after you leave the hospital. Your doctor may also give you specific instructions. While your treatment has been planned according to the most current medical practices available, unavoidable complications occasionally occur. If you have any problems or questions after discharge, call Dr. Jena Gauss at [phone]. ACTIVITY You may resume your regular activity, but move at a slower pace for the next 24 hours.  Take frequent rest periods for the next 24 hours.  Walking will help get rid of the air and reduce the bloated feeling in your belly (abdomen).  No driving for 24 hours (because of the medicine (anesthesia) used during the test).   Do not sign any important legal documents or operate any machinery for 24 hours (because of the anesthesia used during the test).  NUTRITION Drink plenty of fluids.  You may resume your normal diet as instructed by your doctor.  Begin with a light meal and progress to your normal diet. Heavy or fried foods are harder to digest and may make you feel sick to your stomach (nauseated).  Avoid alcoholic beverages for 24 hours or as instructed.  MEDICATIONS You may resume your normal medications unless your doctor tells you otherwise.  WHAT YOU CAN EXPECT TODAY Some feelings of bloating in the abdomen.  Passage of more gas than usual.  Spotting of blood in your stool or on the toilet paper.  IF YOU HAD POLYPS REMOVED DURING THE COLONOSCOPY: No aspirin products for 7 days or as instructed.  No alcohol for 7 days or as instructed.  Eat a soft diet for the next 24 hours.  FINDING OUT THE RESULTS OF YOUR TEST Not all test results are available during your visit. If your test results are not back during the visit, make an appointment with your caregiver to find out the  results. Do not assume everything is normal if you have not heard from your caregiver or the medical facility. It is important for you to follow up on all of your test results.  SEEK IMMEDIATE MEDICAL ATTENTION IF: You have more than a spotting of blood in your stool.  Your belly is swollen (abdominal distention).  You are nauseated or vomiting.  You have a temperature over 101.  You have abdominal pain or discomfort that is severe or gets worse throughout the day.     Multiple polyps removed from your colon today (9);  polyp and diverticulosis information provided  Further recommendations to follow pending review of pathology report  At patient request, called Angelia Mould at [phone] -reviewed findings and recommendations

## 2023-02-20 NOTE — Anesthesia Preprocedure Evaluation (Signed)
Anesthesia Evaluation  Patient identified by MRN, date of birth, ID band Patient awake    Reviewed: Allergy & Precautions, H&P , NPO status , Patient's Chart, lab work & pertinent test results, reviewed documented beta blocker date and time   Airway Mallampati: II  TM Distance: >3 FB Neck ROM: full    Dental no notable dental hx.    Pulmonary neg pulmonary ROS   Pulmonary exam normal breath sounds clear to auscultation       Cardiovascular Exercise Tolerance: Good hypertension, + CAD  negative cardio ROS  Rhythm:regular Rate:Normal     Neuro/Psych  Headaches  Anxiety     negative neurological ROS  negative psych ROS   GI/Hepatic negative GI ROS, Neg liver ROS,,,  Endo/Other  negative endocrine ROSdiabetesHypothyroidism    Renal/GU negative Renal ROS  negative genitourinary   Musculoskeletal   Abdominal   Peds  Hematology negative hematology ROS (+)   Anesthesia Other Findings   Reproductive/Obstetrics negative OB ROS                             Anesthesia Physical Anesthesia Plan  ASA: 3  Anesthesia Plan: General   Post-op Pain Management:    Induction:   PONV Risk Score and Plan: Propofol infusion  Airway Management Planned:   Additional Equipment:   Intra-op Plan:   Post-operative Plan:   Informed Consent: I have reviewed the patients History and Physical, chart, labs and discussed the procedure including the risks, benefits and alternatives for the proposed anesthesia with the patient or authorized representative who has indicated his/her understanding and acceptance.     Dental Advisory Given  Plan Discussed with: CRNA  Anesthesia Plan Comments:        Anesthesia Quick Evaluation

## 2023-02-21 LAB — SURGICAL PATHOLOGY

## 2023-02-21 NOTE — Anesthesia Postprocedure Evaluation (Signed)
Anesthesia Post Note  Patient: Megan Schroeder  Procedure(s) Performed: COLONOSCOPY WITH PROPOFOL POLYPECTOMY INTESTINAL  Patient location during evaluation: Phase II Anesthesia Type: General Level of consciousness: awake Pain management: pain level controlled Vital Signs Assessment: post-procedure vital signs reviewed and stable Respiratory status: spontaneous breathing and respiratory function stable Cardiovascular status: blood pressure returned to baseline and stable Postop Assessment: no headache and no apparent nausea or vomiting Anesthetic complications: no Comments: Late entry   No notable events documented.   Last Vitals:  Vitals:   02/20/23 0700 02/20/23 0808  BP: (!) 140/60 (!) 128/59  Pulse: 76 70  Resp: 12 16  Temp: 36.7 C 36.6 C  SpO2: 100% 97%    Last Pain:  Vitals:   02/20/23 0808  TempSrc: Axillary  PainSc: 0-No pain                 Windell Norfolk

## 2023-02-24 ENCOUNTER — Encounter: Payer: Self-pay | Admitting: Internal Medicine

## 2023-02-27 DIAGNOSIS — Z713 Dietary counseling and surveillance: Secondary | ICD-10-CM | POA: Diagnosis not present

## 2023-02-27 DIAGNOSIS — E785 Hyperlipidemia, unspecified: Secondary | ICD-10-CM | POA: Diagnosis not present

## 2023-02-27 DIAGNOSIS — Z7984 Long term (current) use of oral hypoglycemic drugs: Secondary | ICD-10-CM | POA: Diagnosis not present

## 2023-02-27 DIAGNOSIS — E1165 Type 2 diabetes mellitus with hyperglycemia: Secondary | ICD-10-CM | POA: Diagnosis not present

## 2023-02-27 DIAGNOSIS — Z79899 Other long term (current) drug therapy: Secondary | ICD-10-CM | POA: Diagnosis not present

## 2023-02-27 DIAGNOSIS — Z7182 Exercise counseling: Secondary | ICD-10-CM | POA: Diagnosis not present

## 2023-02-27 DIAGNOSIS — Z6824 Body mass index (BMI) 24.0-24.9, adult: Secondary | ICD-10-CM | POA: Diagnosis not present

## 2023-02-27 DIAGNOSIS — E119 Type 2 diabetes mellitus without complications: Secondary | ICD-10-CM | POA: Diagnosis not present

## 2023-02-27 DIAGNOSIS — Z7985 Long-term (current) use of injectable non-insulin antidiabetic drugs: Secondary | ICD-10-CM | POA: Diagnosis not present

## 2023-02-27 NOTE — Progress Notes (Signed)
. [facility] - Clinic Note  03/06/2023     CHIEF COMPLAINT Patient presents for Retina Follow Up  HISTORY OF PRESENT ILLNESS: Megan Schroeder is a 77 y.o. female who presents to the clinic today for:   HPI     Retina Follow Up   Patient presents with  Diabetic Retinopathy.  In both eyes.  Severity is moderate.  Duration of 6 weeks.  Since onset it is stable.  I, the attending physician,  performed the HPI with the patient and updated documentation appropriately.        Comments   Pt here for 6 wk ret f/u NPDR OU/ERM OU. Pt states VA is the same, no changes.       Last edited by Rennis Chris, MD on 03/06/2023 10:25 AM.    Pt states vision is stable  Referring physician: Lupita Raider, NP [address] [place],  [place] [address]  HISTORICAL INFORMATION:   Selected notes from the MEDICAL RECORD NUMBER Referred by Dr. Laural Benes   CURRENT MEDICATIONS: Current Outpatient Medications (Ophthalmic Drugs)  Medication Sig   Bromfenac Sodium (PROLENSA) 0.07 % SOLN PLACE 1 DROP IN THE RIGHT EYE FOUR TIMES DAILY (Patient taking differently: Place 1 drop into the right eye 2 (two) times daily.)   prednisoLONE acetate (PRED FORTE) 1 % ophthalmic suspension INSTILL 1 DROP IN THE RIGHT EYE FOUR TIMES DAILY (Patient taking differently: Place 1 drop into the right eye in the morning and at bedtime.)   No current facility-administered medications for this visit. (Ophthalmic Drugs)   Current Outpatient Medications (Other)  Medication Sig   amLODipine (NORVASC) 10 MG tablet Take 1 tablet (10 mg total) by mouth daily. For BP   Ascorbic Acid (VITAMIN C) 1000 MG tablet Take 1,000 mg by mouth daily.   aspirin EC 81 MG tablet Take 1 tablet (81 mg total) by mouth daily with breakfast.   atorvastatin (LIPITOR) 40 MG tablet Take 40 mg by mouth daily.   calcium carbonate (OS-CAL) 600 MG tablet Take 600 mg by mouth daily.   cholecalciferol (VITAMIN D) 1000 UNITS tablet Take  1,000 Units by mouth daily.    Coenzyme Q10 (COQ-10) 200 MG CAPS Take 200 mg by mouth daily.    dapagliflozin propanediol (FARXIGA) 10 MG TABS tablet Take 1 tablet (10 mg total) by mouth daily.   Dulaglutide (TRULICITY) 4.5 MG/0.5ML SOPN Inject 4.5 mg into the skin every Sunday.   GNP ULTICARE PEN NEEDLES 31G X 5 MM MISC See admin instructions.   hydrochlorothiazide (HYDRODIURIL) 12.5 MG tablet Take 12.5 mg by mouth daily.   levothyroxine (SYNTHROID) 75 MCG tablet Take 1 tablet (75 mcg total) by mouth daily before breakfast.   lisinopril (ZESTRIL) 40 MG tablet Take 40 mg by mouth daily.   Na Sulfate-K Sulfate-Mg Sulf 17.5-3.13-1.6 GM/177ML SOLN As directed   TOUJEO SOLOSTAR 300 UNIT/ML SOPN Inject 40 Units into the skin at bedtime.   No current facility-administered medications for this visit. (Other)   REVIEW OF SYSTEMS: ROS   Positive for: Gastrointestinal, Endocrine, Cardiovascular, Eyes Negative for: Constitutional, Neurological, Skin, Genitourinary, Musculoskeletal, HENT, Respiratory, Psychiatric, Allergic/Imm, Heme/Lymph Last edited by Thompson Grayer, COT on 03/06/2023  8:15 AM.     ALLERGIES Allergies  Allergen Reactions   Penicillins Hives        Hydrocodone Nausea And Vomiting   Sulfa Antibiotics Rash   PAST MEDICAL HISTORY Past Medical History:  Diagnosis Date   Anxiety  CAD (coronary artery disease)    Diabetic retinopathy (HCC)    NPDR OU   Hyperlipidemia    Hypertension    Hypertensive retinopathy    OU   Hypothyroidism    Type 2 diabetes mellitus (HCC)    Past Surgical History:  Procedure Laterality Date   BREAST EXCISIONAL BIOPSY Left    CATARACT EXTRACTION W/PHACO Left 12/16/2017   Procedure: CATARACT EXTRACTION PHACO AND INTRAOCULAR LENS PLACEMENT (IOC);  Surgeon: Gemma Payor, MD;  Location: [facility];  Service: Ophthalmology;  Laterality: Left;  CDE: 11.65   CATARACT EXTRACTION W/PHACO Right 12/30/2017   Procedure: CATARACT EXTRACTION PHACO AND  INTRAOCULAR LENS PLACEMENT RIGHT EYE;  Surgeon: Gemma Payor, MD;  Location: [facility];  Service: Ophthalmology;  Laterality: Right;  CDE: 9.07   COLONOSCOPY N/A 09/15/2015   Dr. Geni Bers multiple rectal and colonic polyps removed. Hepatic flexure with 9 mm polyp, multiple 5-7 mm polyps. Multiple cecal polyps with largest 1.5 cm, one 5 mm polyp in rectum. Path for colonic polyps with sessile serrated polyp without dysplasia, no high grade dysplasia, tubular adenomas, rectal hyperplastic polyp   COLONOSCOPY WITH PROPOFOL N/A 07/20/2019   multiple tubular adenomas. diverticulosis. 3 year surveillance   COLONOSCOPY WITH PROPOFOL N/A 02/20/2023   Procedure: COLONOSCOPY WITH PROPOFOL;  Surgeon: Corbin Ade, MD;  Location: [facility];  Service: Endoscopy;  Laterality: N/A;  730am, asa 3   EYE SURGERY Bilateral 2019   Cat Sx - Dr. Gemma Payor   LEFT HEART CATHETERIZATION WITH CORONARY ANGIOGRAM N/A 07/23/2011   Procedure: LEFT HEART CATHETERIZATION WITH CORONARY ANGIOGRAM;  Surgeon: Wendall Stade, MD;  Location: [facility] CATH LAB;  Service: Cardiovascular;  Laterality: N/A;   POLYPECTOMY  07/20/2019   Procedure: POLYPECTOMY;  Surgeon: Corbin Ade, MD;  Location: [facility];  Service: Endoscopy;;   POLYPECTOMY  02/20/2023   Procedure: POLYPECTOMY INTESTINAL;  Surgeon: Corbin Ade, MD;  Location: [facility];  Service: Endoscopy;;   YAG LASER APPLICATION Right 09/01/2020   Dr. Rennis Chris   YAG LASER APPLICATION Left 09/01/2020   Dr. Rennis Chris   FAMILY HISTORY Family History  Problem Relation Age of Onset   Cancer Father    Diabetes Father    Colon cancer Sister    SOCIAL HISTORY Social History   Tobacco Use   Smoking status: Never   Smokeless tobacco: Never  Vaping Use   Vaping status: Never Used  Substance Use Topics   Alcohol use: No   Drug use: No       OPHTHALMIC EXAM:  Base Eye Exam     Visual Acuity (Snellen - Linear)       Right Left   Dist cc 20/25 -3 20/20          Tonometry (Tonopen, 8:18 AM)       Right Left   Pressure 18 16         Pupils       Pupils Dark Light Shape React APD   Right PERRL 3 2 Round Brisk None   Left PERRL 3 2 Round Brisk None         Visual Fields       Left Right    Full Full         Extraocular Movement       Right Left    Full, Ortho Full, Ortho         Neuro/Psych     Oriented x3: Yes   Mood/Affect:  Normal         Dilation     Both eyes: 1.0% Mydriacyl, 2.5% Phenylephrine @ 8:19 AM           Slit Lamp and Fundus Exam     Slit Lamp Exam       Right Left   Lids/Lashes Dermatochalasis - upper lid, mild MGD Dermatochalasis - upper lid, mild MGD   Conjunctiva/Sclera white and quiet white and quiet   Cornea 1-2+PEE, well healed temporal cataract wound 1+PEE, well healed temporal cataract wound   Anterior Chamber deep, clear, narrow temporal angle deep and clear   Iris round and dilated, No NVI round and dilated, No NVI   Lens PC IOL in good position, open PC PC IOL in good position with open PC   Anterior Vitreous syneresis, PVD syneresis, Posterior vitreous detachment, vitreous condensations         Fundus Exam       Right Left   Disc pink and sharp, compact, mild PPA pink and sharp, compact, mild PPP   C/D Ratio 0.3 0.1   Macula flat, blunted foveal reflex, ERM with striae inferiorly, punctate IRH, +central cyst / edema, no exudates, focal CWS superior macula -- fading flat, blunted foveal reflex, mild ERM, rare MA, no edema, focal pigment clumping temporal to fovea   Vessels attenuated, tortuous greatest IT arcades attenuated, tortuous   Periphery attached, rare MA / DBH attached, rare MA/DBH, no RT/RD 360           Refraction     Wearing Rx       Sphere Cylinder Axis Add   Right [place]   +2.50   Left -0.25 +0.50 177 +2.50           IMAGING AND PROCEDURES  Imaging and Procedures for 03/06/2023  OCT, Retina - OU - Both Eyes       Right  Eye Quality was borderline. Central Foveal Thickness: 472. Progression has been stable. Findings include no SRF, abnormal foveal contour, epiretinal membrane, intraretinal fluid, macular pucker (ERM with pucker -- stable; mild persistent IRF and central cyst ).   Left Eye Quality was good. Central Foveal Thickness: 257. Progression has been stable. Findings include normal foveal contour, no IRF, no SRF, epiretinal membrane, macular pucker.   Notes *Images captured and stored on drive  Diagnosis / Impression:  OD: ERM with pucker -- stable; mild persistent IRF and central cyst  OS: mild ERM with early pucker, partial PVD  Clinical management:  See below  Abbreviations: NFP - Normal foveal profile. CME - cystoid macular edema. PED - pigment epithelial detachment. IRF - intraretinal fluid. SRF - subretinal fluid. EZ - ellipsoid zone. ERM - epiretinal membrane. ORA - outer retinal atrophy. ORT - outer retinal tubulation. SRHM - subretinal hyper-reflective material. IRHM - intraretinal hyper-reflective material      Intravitreal Injection, Pharmacologic Agent - OD - Right Eye       Time Out 03/06/2023. 8:55 AM. Confirmed correct patient, procedure, site, and patient consented.   Anesthesia Topical anesthesia was used. Anesthetic medications included Lidocaine 2%, Proparacaine 0.5%.   Procedure Preparation included 5% betadine to ocular surface, eyelid speculum. A (32g) needle was used.   Injection: 2 mg aflibercept 2 MG/0.05ML   Route: Intravitreal, Site: Right Eye   NDC: L6038910, Lot: [id number], Expiration date: 05/10/2024, Waste: 0 mL   Post-op Post injection exam found visual acuity of at least counting fingers. The patient tolerated the procedure well. There were no  complications. The patient received written and verbal post procedure care education. Post injection medications were not given.            ASSESSMENT/PLAN:    ICD-10-CM   1. Epiretinal membrane (ERM)  of both eyes  H35.373 OCT, Retina - OU - Both Eyes    2. Moderate nonproliferative diabetic retinopathy of both eyes with macular edema associated with type 2 diabetes mellitus (HCC)  E11.3313 OCT, Retina - OU - Both Eyes    Intravitreal Injection, Pharmacologic Agent - OD - Right Eye    aflibercept (EYLEA) SOLN 2 mg    3. Current use of insulin (HCC)  Z79.4     4. Long term (current) use of oral hypoglycemic drugs  Z79.84     5. Long-term (current) use of injectable non-insulin antidiabetic drugs  Z79.85     6. Essential hypertension  I10     7. Hypertensive retinopathy of both eyes  H35.033     8. Pseudophakia of both eyes  Z96.1      1. Epiretinal membrane OU -- stable  - BCVA OD 20/30; OS 20/20 - OCT shows OD: ERM with pucker and prominent central cyst; persistent IRF and central cyst; OS: mild ERM with early pucker, partial PVD    - IRF OD: ?DME vs CME vs cystic changes - started PF and Prolensa QID OD only for possible CME component on 12.8.22 -- continue, but suspect DME may be significant component  - no metamorphopsia  - monitor for now  - f/u 6 weeks -- DFE/OCT  2-5. Moderate nonproliferative diabetic retinopathy OU - s/p IVA OD #1 (02.16.23), #2 (03.20.23), #3 (04.17.23), #4 (05.22.23), #5 (06.22.23), #6 (07.27.23),  #7 (09.01.23) #8 (10.06.23), #9 (11.09.23), #10 (12.15.23) #11 (01.12.24), #12 (02.09.24) #13 (03.15.24) - s/p IVE OD #1 (04.18.24), #2 (05.24.24), #3 (07.01.24), #4 (08.12.24) - BCVA OD: stable at 20/25, OS: 20/20  - exam shows scattered MA, no NV - OCT shows OD: ERM with pucker stable; mild persistent IRF and central cyst; OS: mild ERM with early pucker, partial PVD at 6 weeks - recommend IVE OD #5 today, 09.25.24 for DME component w/ f/u in 6 wks - pt wishes to proceed - RBA of procedure discussed, questions answered - IVE informed consent obtained and signed, 04.19.24 (OD)  - have obtained Eylea approval for 2024 - see procedure note  - f/u in 6 wks  - DFE/OCT, possible injxn  6,7. Hypertensive retinopathy OU - discussed importance of tight BP control - monitor   8. Pseudophakia OU  - s/p CE/IOL OU, 2019 (Dr. Alto [place])  - IOLs in good position, doing well  - s/p YAG cap OD 3.24.22  - s/p YAG cap OS 04.07.22  - monitor  Ophthalmic Meds Ordered this visit:  Meds ordered this encounter  Medications   aflibercept (EYLEA) SOLN 2 mg     Return in about 6 weeks (around 04/17/2023) for f/u NPDR OU, DFE, OCT.  There are no Patient Instructions on file for this visit.  This document serves as a record of services personally performed by Karie Chimera, MD, PhD. It was created on their behalf by De Blanch, an ophthalmic technician. The creation of this record is the provider's dictation and/or activities during the visit.    Electronically signed by: De Blanch, OA, 03/06/23  10:27 AM  This document serves as a record of services personally performed by Karie Chimera, MD, PhD. It was created on their behalf by Glee Arvin.  Manson Passey, OA an ophthalmic technician. The creation of this record is the provider's dictation and/or activities during the visit.    Electronically signed by: Glee Arvin. Manson Passey, OA 03/06/23 10:27 AM  Karie Chimera, M.D., Ph.D. Diseases & Surgery of the Retina and Vitreous [facility] 03/06/2023   I have reviewed the above documentation for accuracy and completeness, and I agree with the above. Karie Chimera, M.D., Ph.D. 03/06/23 10:28 AM   Abbreviations: M myopia (nearsighted); A astigmatism; H hyperopia (farsighted); P presbyopia; Mrx spectacle prescription;  CTL contact lenses; OD right eye; OS left eye; OU both eyes  XT exotropia; ET esotropia; PEK punctate epithelial keratitis; PEE punctate epithelial erosions; DES dry eye syndrome; MGD meibomian gland dysfunction; ATs artificial tears; PFAT's preservative free artificial tears; NSC nuclear sclerotic cataract; PSC posterior  subcapsular cataract; ERM epi-retinal membrane; PVD posterior vitreous detachment; RD retinal detachment; DM diabetes mellitus; DR diabetic retinopathy; NPDR non-proliferative diabetic retinopathy; PDR proliferative diabetic retinopathy; CSME clinically significant macular edema; DME diabetic macular edema; dbh dot blot hemorrhages; CWS cotton wool spot; POAG primary open angle glaucoma; C/D cup-to-disc ratio; HVF humphrey visual field; GVF goldmann visual field; OCT optical coherence tomography; IOP intraocular pressure; BRVO Branch retinal vein occlusion; CRVO central retinal vein occlusion; CRAO central retinal artery occlusion; BRAO branch retinal artery occlusion; RT retinal tear; SB scleral buckle; PPV pars plana vitrectomy; VH Vitreous hemorrhage; PRP panretinal laser photocoagulation; IVK intravitreal kenalog; VMT vitreomacular traction; MH Macular hole;  NVD neovascularization of the disc; NVE neovascularization elsewhere; AREDS age related eye disease study; ARMD age related macular degeneration; POAG primary open angle glaucoma; EBMD epithelial/anterior basement membrane dystrophy; ACIOL anterior chamber intraocular lens; IOL intraocular lens; PCIOL posterior chamber intraocular lens; Phaco/IOL phacoemulsification with intraocular lens placement; PRK photorefractive keratectomy; LASIK laser assisted in situ keratomileusis; HTN hypertension; DM diabetes mellitus; COPD chronic obstructive pulmonary disease

## 2023-03-01 ENCOUNTER — Encounter (HOSPITAL_COMMUNITY): Payer: Self-pay | Admitting: Internal Medicine

## 2023-03-06 ENCOUNTER — Encounter (INDEPENDENT_AMBULATORY_CARE_PROVIDER_SITE_OTHER): Payer: Self-pay | Admitting: Ophthalmology

## 2023-03-06 ENCOUNTER — Ambulatory Visit (INDEPENDENT_AMBULATORY_CARE_PROVIDER_SITE_OTHER): Payer: Medicare Other | Admitting: Ophthalmology

## 2023-03-06 DIAGNOSIS — E113313 Type 2 diabetes mellitus with moderate nonproliferative diabetic retinopathy with macular edema, bilateral: Secondary | ICD-10-CM

## 2023-03-06 DIAGNOSIS — Z7985 Long-term (current) use of injectable non-insulin antidiabetic drugs: Secondary | ICD-10-CM

## 2023-03-06 DIAGNOSIS — I10 Essential (primary) hypertension: Secondary | ICD-10-CM | POA: Diagnosis not present

## 2023-03-06 DIAGNOSIS — H35033 Hypertensive retinopathy, bilateral: Secondary | ICD-10-CM | POA: Diagnosis not present

## 2023-03-06 DIAGNOSIS — Z7984 Long term (current) use of oral hypoglycemic drugs: Secondary | ICD-10-CM | POA: Diagnosis not present

## 2023-03-06 DIAGNOSIS — H35373 Puckering of macula, bilateral: Secondary | ICD-10-CM

## 2023-03-06 DIAGNOSIS — Z961 Presence of intraocular lens: Secondary | ICD-10-CM | POA: Diagnosis not present

## 2023-03-06 DIAGNOSIS — Z794 Long term (current) use of insulin: Secondary | ICD-10-CM | POA: Diagnosis not present

## 2023-03-06 MED ORDER — AFLIBERCEPT 2MG/0.05ML IZ SOLN FOR KALEIDOSCOPE
2.0000 mg | INTRAVITREAL | Status: AC | PRN
Start: 2023-03-06 — End: 2023-03-06
  Administered 2023-03-06: 2 mg via INTRAVITREAL

## 2023-03-09 ENCOUNTER — Other Ambulatory Visit (INDEPENDENT_AMBULATORY_CARE_PROVIDER_SITE_OTHER): Payer: Self-pay | Admitting: Ophthalmology

## 2023-03-12 ENCOUNTER — Other Ambulatory Visit (INDEPENDENT_AMBULATORY_CARE_PROVIDER_SITE_OTHER): Payer: Self-pay

## 2023-03-12 MED ORDER — BROMFENAC SODIUM 0.07 % OP SOLN: OPHTHALMIC | 6 refills | Status: DC

## 2023-03-26 DIAGNOSIS — M79676 Pain in unspecified toe(s): Secondary | ICD-10-CM | POA: Diagnosis not present

## 2023-03-26 DIAGNOSIS — L84 Corns and callosities: Secondary | ICD-10-CM | POA: Diagnosis not present

## 2023-03-26 DIAGNOSIS — E1142 Type 2 diabetes mellitus with diabetic polyneuropathy: Secondary | ICD-10-CM | POA: Diagnosis not present

## 2023-03-26 DIAGNOSIS — B351 Tinea unguium: Secondary | ICD-10-CM | POA: Diagnosis not present

## 2023-04-03 NOTE — Telephone Encounter (Signed)
Rx filled via phone request MS

## 2023-04-05 NOTE — Progress Notes (Signed)
. [facility] - Clinic Note  04/17/2023     CHIEF COMPLAINT Patient presents for Retina Follow Up  HISTORY OF PRESENT ILLNESS: Megan Schroeder is a 77 y.o. female who presents to the clinic today for:   HPI     Retina Follow Up   Patient presents with  Diabetic Retinopathy.  In both eyes.  This started 6 weeks ago.  I, the attending physician,  performed the HPI with the patient and updated documentation appropriately.        Comments   Patient here for 6 weeks retina follow up for NPDR OU. Patient states vision about the same. No changes. No eye pain. Still using drops.      Last edited by Rennis Chris, MD on 04/17/2023  9:23 AM.     Referring physician: Lupita Raider, NP [address] [place],  [place] [address]  HISTORICAL INFORMATION:   Selected notes from the MEDICAL RECORD NUMBER Referred by Dr. Laural Benes   CURRENT MEDICATIONS: Current Outpatient Medications (Ophthalmic Drugs)  Medication Sig   Bromfenac Sodium (PROLENSA) 0.07 % SOLN PLACE 1 DROP IN THE RIGHT EYE FOUR TIMES DAILY   prednisoLONE acetate (PRED FORTE) 1 % ophthalmic suspension INSTILL 1 DROP IN THE RIGHT EYE FOUR TIMES DAILY (Patient taking differently: Place 1 drop into the right eye in the morning and at bedtime.)   No current facility-administered medications for this visit. (Ophthalmic Drugs)   Current Outpatient Medications (Other)  Medication Sig   amLODipine (NORVASC) 10 MG tablet Take 1 tablet (10 mg total) by mouth daily. For BP   Ascorbic Acid (VITAMIN C) 1000 MG tablet Take 1,000 mg by mouth daily.   aspirin EC 81 MG tablet Take 1 tablet (81 mg total) by mouth daily with breakfast.   atorvastatin (LIPITOR) 40 MG tablet Take 40 mg by mouth daily.   calcium carbonate (OS-CAL) 600 MG tablet Take 600 mg by mouth daily.   cholecalciferol (VITAMIN D) 1000 UNITS tablet Take 1,000 Units by mouth daily.    Coenzyme Q10 (COQ-10) 200 MG CAPS Take 200 mg by mouth daily.     dapagliflozin propanediol (FARXIGA) 10 MG TABS tablet Take 1 tablet (10 mg total) by mouth daily.   Dulaglutide (TRULICITY) 4.5 MG/0.5ML SOPN Inject 4.5 mg into the skin every Sunday.   GNP ULTICARE PEN NEEDLES 31G X 5 MM MISC See admin instructions.   hydrochlorothiazide (HYDRODIURIL) 12.5 MG tablet Take 12.5 mg by mouth daily.   levothyroxine (SYNTHROID) 75 MCG tablet Take 1 tablet (75 mcg total) by mouth daily before breakfast.   lisinopril (ZESTRIL) 40 MG tablet Take 40 mg by mouth daily.   Na Sulfate-K Sulfate-Mg Sulf 17.5-3.13-1.6 GM/177ML SOLN As directed   TOUJEO SOLOSTAR 300 UNIT/ML SOPN Inject 40 Units into the skin at bedtime.   No current facility-administered medications for this visit. (Other)   REVIEW OF SYSTEMS: ROS   Positive for: Gastrointestinal, Endocrine, Cardiovascular, Eyes Negative for: Constitutional, Neurological, Skin, Genitourinary, Musculoskeletal, HENT, Respiratory, Psychiatric, Allergic/Imm, Heme/Lymph Last edited by Laddie Aquas, COA on 04/17/2023  8:02 AM.      ALLERGIES Allergies  Allergen Reactions   Penicillins Hives        Hydrocodone Nausea And Vomiting   Sulfa Antibiotics Rash   PAST MEDICAL HISTORY Past Medical History:  Diagnosis Date   Anxiety    CAD (coronary artery disease)    Diabetic retinopathy (HCC)    NPDR OU   Hyperlipidemia  Hypertension    Hypertensive retinopathy    OU   Hypothyroidism    Type 2 diabetes mellitus [facility])    Past Surgical History:  Procedure Laterality Date   BREAST EXCISIONAL BIOPSY Left    CATARACT EXTRACTION W/PHACO Left 12/16/2017   Procedure: CATARACT EXTRACTION PHACO AND INTRAOCULAR LENS PLACEMENT (IOC);  Surgeon: Gemma Payor, MD;  Location: [facility];  Service: Ophthalmology;  Laterality: Left;  CDE: 11.65   CATARACT EXTRACTION W/PHACO Right 12/30/2017   Procedure: CATARACT EXTRACTION PHACO AND INTRAOCULAR LENS PLACEMENT RIGHT EYE;  Surgeon: Gemma Payor, MD;  Location: [facility];  Service:  Ophthalmology;  Laterality: Right;  CDE: 9.07   COLONOSCOPY N/A 09/15/2015   Dr. Geni Bers multiple rectal and colonic polyps removed. Hepatic flexure with 9 mm polyp, multiple 5-7 mm polyps. Multiple cecal polyps with largest 1.5 cm, one 5 mm polyp in rectum. Path for colonic polyps with sessile serrated polyp without dysplasia, no high grade dysplasia, tubular adenomas, rectal hyperplastic polyp   COLONOSCOPY WITH PROPOFOL N/A 07/20/2019   multiple tubular adenomas. diverticulosis. 3 year surveillance   COLONOSCOPY WITH PROPOFOL N/A 02/20/2023   Procedure: COLONOSCOPY WITH PROPOFOL;  Surgeon: Corbin Ade, MD;  Location: [facility];  Service: Endoscopy;  Laterality: N/A;  730am, asa 3   EYE SURGERY Bilateral 2019   Cat Sx - Dr. Gemma Payor   LEFT HEART CATHETERIZATION WITH CORONARY ANGIOGRAM N/A 07/23/2011   Procedure: LEFT HEART CATHETERIZATION WITH CORONARY ANGIOGRAM;  Surgeon: Wendall Stade, MD;  Location: [facility] C[facility] LAB;  Service: Cardiovascular;  Laterality: N/A;   POLYPECTOMY  07/20/2019   Procedure: POLYPECTOMY;  Surgeon: Corbin Ade, MD;  Location: [facility];  Service: Endoscopy;;   POLYPECTOMY  02/20/2023   Procedure: POLYPECTOMY INTESTINAL;  Surgeon: Corbin Ade, MD;  Location: [facility];  Service: Endoscopy;;   YAG LASER APPLICATION Right 09/01/2020   Dr. Rennis Chris   YAG LASER APPLICATION Left 09/01/2020   Dr. Rennis Chris   FAMILY HISTORY Family History  Problem Relation Age of Onset   Cancer Father    Diabetes Father    Colon cancer Sister    SOCIAL HISTORY Social History   Tobacco Use   Smoking status: Never   Smokeless tobacco: Never  Vaping Use   Vaping status: Never Used  Substance Use Topics   Alcohol use: No   Drug use: No       OPHTHALMIC EXAM:  Base Eye Exam     Visual Acuity (Snellen - Linear)       Right Left   Dist cc 20/25 -2 20/20   Dist ph cc NI     Correction: Glasses         Tonometry (Tonopen, 7:59 AM)        Right Left   Pressure 16 16         Pupils       Dark Light Shape React APD   Right 3 2 Round Brisk None   Left 3 2 Round Brisk None         Visual Fields (Counting fingers)       Left Right    Full Full         Extraocular Movement       Right Left    Full, Ortho Full, Ortho         Neuro/Psych     Oriented x3: Yes   Mood/Affect: Normal         Dilation  Both eyes: 1.0% Mydriacyl, 2.5% Phenylephrine @ 7:59 AM           Slit Lamp and Fundus Exam     Slit Lamp Exam       Right Left   Lids/Lashes Dermatochalasis - upper lid, mild MGD Dermatochalasis - upper lid, mild MGD   Conjunctiva/Sclera white and quiet white and quiet   Cornea 1-2+PEE, well healed temporal cataract wound 1+PEE, well healed temporal cataract wound   Anterior Chamber deep, clear, narrow temporal angle deep and clear   Iris round and dilated, No NVI round and dilated, No NVI   Lens PC IOL in good position, open PC PC IOL in good position with open PC   Anterior Vitreous syneresis, PVD syneresis, Posterior vitreous detachment, vitreous condensations         Fundus Exam       Right Left   Disc pink and sharp, compact, mild PPA pink and sharp, compact, mild PPP   C/D Ratio 0.3 0.1   Macula flat, blunted foveal reflex, ERM with striae inferiorly, punctate IRH, +central cyst / edema, no exudates, focal CWS superior macula -- stably improved flat, blunted foveal reflex, mild ERM, rare MA, no edema, focal pigment clumping temporal to fovea   Vessels attenuated, tortuous greatest IT arcades attenuated, tortuous   Periphery attached, rare MA / DBH attached, rare MA/DBH, no RT/RD 360           Refraction     Wearing Rx       Sphere Cylinder Axis Add   Right [place]   +2.50   Left -0.25 +0.50 177 +2.50           IMAGING AND PROCEDURES  Imaging and Procedures for 04/17/2023  OCT, Retina - OU - Both Eyes       Right Eye Quality was good. Central Foveal Thickness:  472. Progression has been stable. Findings include no SRF, abnormal foveal contour, epiretinal membrane, intraretinal fluid, macular pucker (ERM with pucker -- stable; mild persistent IRF and central cyst ).   Left Eye Quality was good. Central Foveal Thickness: 262. Progression has been stable. Findings include normal foveal contour, no IRF, no SRF, epiretinal membrane, macular pucker.   Notes *Images captured and stored on drive  Diagnosis / Impression:  OD: ERM with pucker -- stable; mild persistent IRF and central cyst  OS: mild ERM with early pucker, partial PVD  Clinical management:  See below  Abbreviations: NFP - Normal foveal profile. CME - cystoid macular edema. PED - pigment epithelial detachment. IRF - intraretinal fluid. SRF - subretinal fluid. EZ - ellipsoid zone. ERM - epiretinal membrane. ORA - outer retinal atrophy. ORT - outer retinal tubulation. SRHM - subretinal hyper-reflective material. IRHM - intraretinal hyper-reflective material      Intravitreal Injection, Pharmacologic Agent - OD - Right Eye       Time Out 04/17/2023. 8:44 AM. Confirmed correct patient, procedure, site, and patient consented.   Anesthesia Topical anesthesia was used. Anesthetic medications included Lidocaine 2%, Proparacaine 0.5%.   Procedure Preparation included 5% betadine to ocular surface, eyelid speculum. A (32g) needle was used.   Injection: 2 mg aflibercept 2 MG/0.05ML   Route: Intravitreal, Site: Right Eye   NDC: L6038910, Lot: [id number], Expiration date: 02/09/2024, Waste: 0 mL   Post-op Post injection exam found visual acuity of at least counting fingers. The patient tolerated the procedure well. There were no complications. The patient received written and verbal post procedure care education. Post injection  medications were not given.            ASSESSMENT/PLAN:    ICD-10-CM   1. Epiretinal membrane (ERM) of both eyes  H35.373 OCT, Retina - OU - Both Eyes     2. Moderate nonproliferative diabetic retinopathy of both eyes with macular edema associated with type 2 diabetes mellitus (HCC)  N56.2130 Intravitreal Injection, Pharmacologic Agent - OD - Right Eye    aflibercept (EYLEA) SOLN 2 mg    3. Current use of insulin (HCC)  Z79.4     4. Long term (current) use of oral hypoglycemic drugs  Z79.84     5. Long-term (current) use of injectable non-insulin antidiabetic drugs  Z79.85     6. Essential hypertension  I10     7. Hypertensive retinopathy of both eyes  H35.033     8. Pseudophakia of both eyes  Z96.1      1. Epiretinal membrane OU -- stable  - BCVA OD 20/25; OS 20/20 - OCT shows OD: ERM with pucker and prominent central cyst; persistent IRF and central cyst; OS: mild ERM with early pucker, partial PVD    - IRF OD: ?DME vs CME vs cystic changes - started PF and Prolensa QID OD only for possible CME component on 12.8.22 -- continue, but suspect DME may be significant component  - no metamorphopsia  - monitor for now  - f/u 6 weeks -- DFE/OCT  2-5. Moderate nonproliferative diabetic retinopathy OU - s/p IVA OD #1 (02.16.23), #2 (03.20.23), #3 (04.17.23), #4 (05.22.23), #5 (06.22.23), #6 (07.27.23),  #7 (09.01.23) #8 (10.06.23), #9 (11.09.23), #10 (12.15.23) #11 (01.12.24), #12 (02.09.24) #13 (03.15.24) - s/p IVE OD #1 (04.18.24), #2 (05.24.24), #3 (07.01.24), #4 (08.12.24), #5 (09.25.24) - BCVA OD: stable at 20/25, OS: 20/20  - exam shows scattered MA, no NV - OCT shows OD: ERM with pucker stable; mild persistent IRF and central cyst; OS: mild ERM with early pucker, partial PVD at 6 weeks - recommend IVE OD #6 today, 11.06.24 for DME component w/ f/u in 6 wks - pt wishes to proceed - RBA of procedure discussed, questions answered - IVE informed consent obtained and signed, 04.19.24 (OD)  - have obtained Eylea approval for 2024 - see procedure note  - f/u in 6 wks - DFE/OCT, possible injxn  6,7. Hypertensive retinopathy OU -  discussed importance of tight BP control - monitor   8. Pseudophakia OU  - s/p CE/IOL OU, 2019 (Dr. Alto Denver)  - IOLs in good position, doing well  - s/p YAG cap OD 3.24.22  - s/p YAG cap OS 04.07.22  - monitor  Ophthalmic Meds Ordered this visit:  Meds ordered this encounter  Medications   aflibercept (EYLEA) SOLN 2 mg     Return in about 6 weeks (around 05/29/2023) for f/u NPDR OU, DFE, OCT.  There are no Patient Instructions on file for this visit.  This document serves as a record of services personally performed by Karie Chimera, MD, PhD. It was created on their behalf by De Blanch, an ophthalmic technician. The creation of this record is the provider's dictation and/or activities during the visit.    Electronically signed by: De Blanch, OA, 04/18/23  2:57 PM  This document serves as a record of services personally performed by Karie Chimera, MD, PhD. It was created on their behalf by Glee Arvin. Manson Passey, OA an ophthalmic technician. The creation of this record is the provider's dictation and/or activities during the visit.  Electronically signed by: Glee Arvin. Manson Passey, OA 04/18/23 2:57 PM  Karie Chimera, M.D., Ph.D. Diseases & Surgery of the Retina and Vitreous [facility] 04/17/2023   I have reviewed the above documentation for accuracy and completeness, and I agree with the above. Karie Chimera, M.D., Ph.D. 04/18/23 2:58 PM   Abbreviations: M myopia (nearsighted); A astigmatism; H hyperopia (farsighted); P presbyopia; Mrx spectacle prescription;  CTL contact lenses; OD right eye; OS left eye; OU both eyes  XT exotropia; ET esotropia; PEK punctate epithelial keratitis; PEE punctate epithelial erosions; DES dry eye syndrome; MGD meibomian gland dysfunction; ATs artificial tears; PFAT's preservative free artificial tears; NSC nuclear sclerotic cataract; PSC posterior subcapsular cataract; ERM epi-retinal membrane; PVD posterior vitreous  detachment; RD retinal detachment; DM diabetes mellitus; DR diabetic retinopathy; NPDR non-proliferative diabetic retinopathy; PDR proliferative diabetic retinopathy; CSME clinically significant macular edema; DME diabetic macular edema; dbh dot blot hemorrhages; CWS cotton wool spot; POAG primary open angle glaucoma; C/D cup-to-disc ratio; HVF humphrey visual field; GVF goldmann visual field; OCT optical coherence tomography; IOP intraocular pressure; BRVO Branch retinal vein occlusion; CRVO central retinal vein occlusion; CRAO central retinal artery occlusion; BRAO branch retinal artery occlusion; RT retinal tear; SB scleral buckle; PPV pars plana vitrectomy; VH Vitreous hemorrhage; PRP panretinal laser photocoagulation; IVK intravitreal kenalog; VMT vitreomacular traction; MH Macular hole;  NVD neovascularization of the disc; NVE neovascularization elsewhere; AREDS age related eye disease study; ARMD age related macular degeneration; POAG primary open angle glaucoma; EBMD epithelial/anterior basement membrane dystrophy; ACIOL anterior chamber intraocular lens; IOL intraocular lens; PCIOL posterior chamber intraocular lens; Phaco/IOL phacoemulsification with intraocular lens placement; PRK photorefractive keratectomy; LASIK laser assisted in situ keratomileusis; HTN hypertension; DM diabetes mellitus; COPD chronic obstructive pulmonary disease

## 2023-04-10 DIAGNOSIS — E1165 Type 2 diabetes mellitus with hyperglycemia: Secondary | ICD-10-CM | POA: Diagnosis not present

## 2023-04-10 DIAGNOSIS — E039 Hypothyroidism, unspecified: Secondary | ICD-10-CM | POA: Diagnosis not present

## 2023-04-10 DIAGNOSIS — E785 Hyperlipidemia, unspecified: Secondary | ICD-10-CM | POA: Diagnosis not present

## 2023-04-17 ENCOUNTER — Ambulatory Visit (INDEPENDENT_AMBULATORY_CARE_PROVIDER_SITE_OTHER): Payer: Medicare Other | Admitting: Ophthalmology

## 2023-04-17 ENCOUNTER — Encounter (INDEPENDENT_AMBULATORY_CARE_PROVIDER_SITE_OTHER): Payer: Self-pay | Admitting: Ophthalmology

## 2023-04-17 DIAGNOSIS — Z7984 Long term (current) use of oral hypoglycemic drugs: Secondary | ICD-10-CM | POA: Diagnosis not present

## 2023-04-17 DIAGNOSIS — H35033 Hypertensive retinopathy, bilateral: Secondary | ICD-10-CM

## 2023-04-17 DIAGNOSIS — Z79899 Other long term (current) drug therapy: Secondary | ICD-10-CM | POA: Diagnosis not present

## 2023-04-17 DIAGNOSIS — E039 Hypothyroidism, unspecified: Secondary | ICD-10-CM | POA: Diagnosis not present

## 2023-04-17 DIAGNOSIS — Z961 Presence of intraocular lens: Secondary | ICD-10-CM

## 2023-04-17 DIAGNOSIS — I700 Atherosclerosis of aorta: Secondary | ICD-10-CM | POA: Diagnosis not present

## 2023-04-17 DIAGNOSIS — H35373 Puckering of macula, bilateral: Secondary | ICD-10-CM | POA: Diagnosis not present

## 2023-04-17 DIAGNOSIS — Z794 Long term (current) use of insulin: Secondary | ICD-10-CM | POA: Diagnosis not present

## 2023-04-17 DIAGNOSIS — R809 Proteinuria, unspecified: Secondary | ICD-10-CM | POA: Diagnosis not present

## 2023-04-17 DIAGNOSIS — Z7985 Long-term (current) use of injectable non-insulin antidiabetic drugs: Secondary | ICD-10-CM

## 2023-04-17 DIAGNOSIS — Z7182 Exercise counseling: Secondary | ICD-10-CM | POA: Diagnosis not present

## 2023-04-17 DIAGNOSIS — E113313 Type 2 diabetes mellitus with moderate nonproliferative diabetic retinopathy with macular edema, bilateral: Secondary | ICD-10-CM

## 2023-04-17 DIAGNOSIS — E785 Hyperlipidemia, unspecified: Secondary | ICD-10-CM | POA: Diagnosis not present

## 2023-04-17 DIAGNOSIS — Z23 Encounter for immunization: Secondary | ICD-10-CM | POA: Diagnosis not present

## 2023-04-17 DIAGNOSIS — Z713 Dietary counseling and surveillance: Secondary | ICD-10-CM | POA: Diagnosis not present

## 2023-04-17 DIAGNOSIS — E1165 Type 2 diabetes mellitus with hyperglycemia: Secondary | ICD-10-CM | POA: Diagnosis not present

## 2023-04-17 DIAGNOSIS — I10 Essential (primary) hypertension: Secondary | ICD-10-CM | POA: Diagnosis not present

## 2023-04-17 MED ORDER — AFLIBERCEPT 2MG/0.05ML IZ SOLN FOR KALEIDOSCOPE
2.0000 mg | INTRAVITREAL | Status: AC | PRN
Start: 2023-04-17 — End: 2023-04-17
  Administered 2023-04-17: 2 mg via INTRAVITREAL

## 2023-05-23 NOTE — Progress Notes (Signed)
. [facility] - Clinic Note  05/29/2023     CHIEF COMPLAINT Patient presents for Retina Follow Up  HISTORY OF PRESENT ILLNESS: Megan Schroeder is a 77 y.o. female who presents to the clinic today for:   HPI     Retina Follow Up   Patient presents with  Other.  In both eyes.  Severity is moderate.  Duration of 6 weeks.  Since onset it is stable.  I, the attending physician,  performed the HPI with the patient and updated documentation appropriately.        Comments   Pt here for 6wk ret f/u ERM OU. Pt states no VA changes.       Last edited by Rennis Chris, MD on 05/29/2023 12:44 PM.      Referring physician: Lupita Raider, NP [address] [place],  [place] [address]  HISTORICAL INFORMATION:   Selected notes from the MEDICAL RECORD NUMBER Referred by Dr. Laural Benes   CURRENT MEDICATIONS: Current Outpatient Medications (Ophthalmic Drugs)  Medication Sig   Bromfenac Sodium (PROLENSA) 0.07 % SOLN PLACE 1 DROP IN THE RIGHT EYE FOUR TIMES DAILY   Bromfenac Sodium 0.07 % SOLN Place 1 drop into the right eye 4 (four) times daily.   prednisoLONE acetate (PRED FORTE) 1 % ophthalmic suspension INSTILL 1 DROP IN THE RIGHT EYE FOUR TIMES DAILY (Patient taking differently: Place 1 drop into the right eye in the morning and at bedtime.)   No current facility-administered medications for this visit. (Ophthalmic Drugs)   Current Outpatient Medications (Other)  Medication Sig   amLODipine (NORVASC) 10 MG tablet Take 1 tablet (10 mg total) by mouth daily. For BP   Ascorbic Acid (VITAMIN C) 1000 MG tablet Take 1,000 mg by mouth daily.   aspirin EC 81 MG tablet Take 1 tablet (81 mg total) by mouth daily with breakfast.   atorvastatin (LIPITOR) 40 MG tablet Take 40 mg by mouth daily.   calcium carbonate (OS-CAL) 600 MG tablet Take 600 mg by mouth daily.   cholecalciferol (VITAMIN D) 1000 UNITS tablet Take 1,000 Units by mouth daily.    Coenzyme Q10 (COQ-10) 200  MG CAPS Take 200 mg by mouth daily.    dapagliflozin propanediol (FARXIGA) 10 MG TABS tablet Take 1 tablet (10 mg total) by mouth daily.   Dulaglutide (TRULICITY) 4.5 MG/0.5ML SOPN Inject 4.5 mg into the skin every Sunday.   GNP ULTICARE PEN NEEDLES 31G X 5 MM MISC See admin instructions.   hydrochlorothiazide (HYDRODIURIL) 12.5 MG tablet Take 12.5 mg by mouth daily.   levothyroxine (SYNTHROID) 75 MCG tablet Take 1 tablet (75 mcg total) by mouth daily before breakfast.   lisinopril (ZESTRIL) 40 MG tablet Take 40 mg by mouth daily.   Na Sulfate-K Sulfate-Mg Sulf 17.5-3.13-1.6 GM/177ML SOLN As directed   TOUJEO SOLOSTAR 300 UNIT/ML SOPN Inject 40 Units into the skin at bedtime.   No current facility-administered medications for this visit. (Other)   REVIEW OF SYSTEMS: ROS   Positive for: Gastrointestinal, Endocrine, Cardiovascular, Eyes Negative for: Constitutional, Neurological, Skin, Genitourinary, Musculoskeletal, HENT, Respiratory, Psychiatric, Allergic/Imm, Heme/Lymph Last edited by Thompson Grayer, COT on 05/29/2023  7:49 AM.       ALLERGIES Allergies  Allergen Reactions   Penicillins Hives        Hydrocodone Nausea And Vomiting   Sulfa Antibiotics Rash   PAST MEDICAL HISTORY Past Medical History:  Diagnosis Date   Anxiety    CAD (coronary artery disease)  Diabetic retinopathy (HCC)    NPDR OU   Hyperlipidemia    Hypertension    Hypertensive retinopathy    OU   Hypothyroidism    Type 2 diabetes mellitus [facility])    Past Surgical History:  Procedure Laterality Date   BREAST EXCISIONAL BIOPSY Left    CATARACT EXTRACTION W/PHACO Left 12/16/2017   Procedure: CATARACT EXTRACTION PHACO AND INTRAOCULAR LENS PLACEMENT (IOC);  Surgeon: Gemma Payor, MD;  Location: [facility] ORS;  Service: Ophthalmology;  Laterality: Left;  CDE: 11.65   CATARACT EXTRACTION W/PHACO Right 12/30/2017   Procedure: CATARACT EXTRACTION PHACO AND INTRAOCULAR LENS PLACEMENT RIGHT EYE;  Surgeon: Gemma Payor, MD;  Location: [facility] ORS;  Service: Ophthalmology;  Laterality: Right;  CDE: 9.07   COLONOSCOPY N/A 09/15/2015   Dr. Geni Bers multiple rectal and colonic polyps removed. Hepatic flexure with 9 mm polyp, multiple 5-7 mm polyps. Multiple cecal polyps with largest 1.5 cm, one 5 mm polyp in rectum. Path for colonic polyps with sessile serrated polyp without dysplasia, no high grade dysplasia, tubular adenomas, rectal hyperplastic polyp   COLONOSCOPY WITH PROPOFOL N/A 07/20/2019   multiple tubular adenomas. diverticulosis. 3 year surveillance   COLONOSCOPY WITH PROPOFOL N/A 02/20/2023   Procedure: COLONOSCOPY WITH PROPOFOL;  Surgeon: Corbin Ade, MD;  Location: [facility] SUITE;  Service: Endoscopy;  Laterality: N/A;  730am, asa 3   EYE SURGERY Bilateral 2019   Cat Sx - Dr. Gemma Payor   LEFT HEART CATHETERIZATION WITH CORONARY ANGIOGRAM N/A 07/23/2011   Procedure: LEFT HEART CATHETERIZATION WITH CORONARY ANGIOGRAM;  Surgeon: Wendall Stade, MD;  Location: [facility] CATH LAB;  Service: Cardiovascular;  Laterality: N/A;   POLYPECTOMY  07/20/2019   Procedure: POLYPECTOMY;  Surgeon: Corbin Ade, MD;  Location: [facility];  Service: Endoscopy;;   POLYPECTOMY  02/20/2023   Procedure: POLYPECTOMY INTESTINAL;  Surgeon: Corbin Ade, MD;  Location: [facility];  Service: Endoscopy;;   YAG LASER APPLICATION Right 09/01/2020   Dr. Rennis Chris   YAG LASER APPLICATION Left 09/01/2020   Dr. Rennis Chris   FAMILY HISTORY Family History  Problem Relation Age of Onset   Cancer Father    Diabetes Father    Colon cancer Sister    SOCIAL HISTORY Social History   Tobacco Use   Smoking status: Never   Smokeless tobacco: Never  Vaping Use   Vaping status: Never Used  Substance Use Topics   Alcohol use: No   Drug use: No       OPHTHALMIC EXAM:  Base Eye Exam     Visual Acuity (Snellen - Linear)       Right Left   Dist [place] 20/25 -3 20/20 -2         Tonometry (Tonopen, 7:52 AM)        Right Left   Pressure 12 13         Pupils       Pupils Dark Light Shape React APD   Right PERRL 3 2 Round Brisk None   Left PERRL 3 2 Round Brisk None         Visual Fields (Counting fingers)       Left Right    Full Full         Extraocular Movement       Right Left    Full, Ortho Full, Ortho         Neuro/Psych     Oriented x3: Yes   Mood/Affect: Normal  Dilation     Both eyes: 1.0% Mydriacyl, 2.5% Phenylephrine @ 7:53 AM           Slit Lamp and Fundus Exam     Slit Lamp Exam       Right Left   Lids/Lashes Dermatochalasis - upper lid, mild MGD Dermatochalasis - upper lid, mild MGD   Conjunctiva/Sclera white and quiet white and quiet   Cornea 1-2+PEE, well healed temporal cataract wound 1+PEE, well healed temporal cataract wound   Anterior Chamber deep, clear, narrow temporal angle deep and clear   Iris round and dilated, No NVI round and dilated, No NVI   Lens PC IOL in good position, open PC PC IOL in good position with open PC   Anterior Vitreous syneresis, PVD syneresis, Posterior vitreous detachment, vitreous condensations         Fundus Exam       Right Left   Disc pink and sharp, compact, mild PPA pink and sharp, compact, mild PPP   C/D Ratio 0.3 0.1   Macula flat, blunted foveal reflex, ERM with striae inferiorly, punctate IRH, +central cyst / edema, no exudates flat, blunted foveal reflex, mild ERM, rare MA, no edema, focal pigment clumping temporal to fovea   Vessels attenuated, tortuous greatest IT arcades attenuated, tortuous   Periphery attached, rare MA / DBH attached, rare MA/DBH, no RT/RD 360           IMAGING AND PROCEDURES  Imaging and Procedures for 05/29/2023  OCT, Retina - OU - Both Eyes       Right Eye Quality was good. Central Foveal Thickness: 472. Progression has been stable. Findings include no SRF, abnormal foveal contour, epiretinal membrane, intraretinal fluid, macular pucker (ERM with pucker --  stable; mild persistent IRF and central cyst ).   Left Eye Quality was good. Central Foveal Thickness: 262. Progression has been stable. Findings include normal foveal contour, no IRF, no SRF, epiretinal membrane, macular pucker (ERM with early pucker, partial PVD).   Notes *Images captured and stored on drive  Diagnosis / Impression:  OD: ERM with pucker -- stable; mild persistent IRF and central cyst  OS: ERM with early pucker, partial PVD  Clinical management:  See below  Abbreviations: NFP - Normal foveal profile. CME - cystoid macular edema. PED - pigment epithelial detachment. IRF - intraretinal fluid. SRF - subretinal fluid. EZ - ellipsoid zone. ERM - epiretinal membrane. ORA - outer retinal atrophy. ORT - outer retinal tubulation. SRHM - subretinal hyper-reflective material. IRHM - intraretinal hyper-reflective material      Intravitreal Injection, Pharmacologic Agent - OD - Right Eye       Time Out 05/29/2023. 8:20 AM. Confirmed correct patient, procedure, site, and patient consented.   Anesthesia Topical anesthesia was used. Anesthetic medications included Lidocaine 2%, Proparacaine 0.5%.   Procedure Preparation included 5% betadine to ocular surface, eyelid speculum. A (32g) needle was used.   Injection: 2 mg aflibercept 2 MG/0.05ML   Route: Intravitreal, Site: Right Eye   NDC: L6038910, Lot: [id number], Expiration date: 11/09/2023, Waste: 0 mL   Post-op Post injection exam found visual acuity of at least counting fingers. The patient tolerated the procedure well. There were no complications. The patient received written and verbal post procedure care education. Post injection medications were not given.            ASSESSMENT/PLAN:    ICD-10-CM   1. Epiretinal membrane (ERM) of both eyes  H35.373 OCT, Retina - OU - Both Eyes  2. Moderate nonproliferative diabetic retinopathy of both eyes with macular edema associated with type 2 diabetes mellitus  (HCC)  D32.2025 Intravitreal Injection, Pharmacologic Agent - OD - Right Eye    aflibercept (EYLEA) SOLN 2 mg    3. Current use of insulin (HCC)  Z79.4     4. Long term (current) use of oral hypoglycemic drugs  Z79.84     5. Long-term (current) use of injectable non-insulin antidiabetic drugs  Z79.85     6. Essential hypertension  I10     7. Hypertensive retinopathy of both eyes  H35.033     8. Pseudophakia of both eyes  Z96.1      1. Epiretinal membrane OU -- stable  - BCVA OD 20/25; OS 20/20 - OCT shows OD: ERM with pucker and prominent central cyst; persistent IRF and central cyst; OS: mild ERM with early pucker, partial PVD    - IRF OD: ?DME vs CME vs cystic changes - started PF and Prolensa QID OD only for possible CME component on 12.8.22 -- continue, but suspect DME may be significant component  - no metamorphopsia  - monitor for now  - f/u 6 weeks -- DFE/OCT  2-5. Moderate nonproliferative diabetic retinopathy OU - s/p IVA OD #1 (02.16.23), #2 (03.20.23), #3 (04.17.23), #4 (05.22.23), #5 (06.22.23), #6 (07.27.23),  #7 (09.01.23) #8 (10.06.23), #9 (11.09.23), #10 (12.15.23) #11 (01.12.24), #12 (02.09.24) #13 (03.15.24) - s/p IVE OD #1 (04.18.24), #2 (05.24.24), #3 (07.01.24), #4 (08.12.24), #5 (09.25.24), #6 (11.06.24) - BCVA OD: stable at 20/25, OS: 20/20  - exam shows scattered MA, no NV - OCT shows OD: ERM with pucker stable; mild persistent IRF and central cyst; OS: ERM with early pucker, partial PVD at 6 weeks - recommend IVE OD #7 today, 12.18.24 for DME component w/ f/u in 6 wks - pt wishes to proceed - RBA of procedure discussed, questions answered - IVE informed consent obtained and signed, 04.19.24 (OD)  - have obtained Eylea approval for 2024 - see procedure note  - f/u in 6 wks - DFE/OCT, possible injxn  6,7. Hypertensive retinopathy OU - discussed importance of tight BP control - monitor   8. Pseudophakia OU  - s/p CE/IOL OU, 2019 (Dr. Alto Denver)  - IOLs in  good position, doing well  - s/p YAG cap OD 3.24.22  - s/p YAG cap OS 04.07.22  - monitor  Ophthalmic Meds Ordered this visit:  Meds ordered this encounter  Medications   Bromfenac Sodium 0.07 % SOLN    Sig: Place 1 drop into the right eye 4 (four) times daily.    Dispense:  3 mL    Refill:  6   aflibercept (EYLEA) SOLN 2 mg     Return in about 6 weeks (around 07/10/2023) for f/u NPDR OU, DFE, OCT.  There are no Patient Instructions on file for this visit.  This document serves as a record of services personally performed by Karie Chimera, MD, PhD. It was created on their behalf by De Blanch, an ophthalmic technician. The creation of this record is the provider's dictation and/or activities during the visit.    Electronically signed by: De Blanch, OA, 05/29/23  12:48 PM  This document serves as a record of services personally performed by Karie Chimera, MD, PhD. It was created on their behalf by Glee Arvin. Manson Passey, OA an ophthalmic technician. The creation of this record is the provider's dictation and/or activities during the visit.    Electronically signed by: Marchelle Folks  Thomes Lolling, OA 05/29/23 12:48 PM  Karie Chimera, M.D., Ph.D. Diseases & Surgery of the Retina and Vitreous [facility] 05/29/2023   I have reviewed the above documentation for accuracy and completeness, and I agree with the above. Karie Chimera, M.D., Ph.D. 05/29/23 12:48 PM  Abbreviations: M myopia (nearsighted); A astigmatism; H hyperopia (farsighted); P presbyopia; Mrx spectacle prescription;  CTL contact lenses; OD right eye; OS left eye; OU both eyes  XT exotropia; ET esotropia; PEK punctate epithelial keratitis; PEE punctate epithelial erosions; DES dry eye syndrome; MGD meibomian gland dysfunction; ATs artificial tears; PFAT's preservative free artificial tears; NSC nuclear sclerotic cataract; PSC posterior subcapsular cataract; ERM epi-retinal membrane; PVD posterior  vitreous detachment; RD retinal detachment; DM diabetes mellitus; DR diabetic retinopathy; NPDR non-proliferative diabetic retinopathy; PDR proliferative diabetic retinopathy; CSME clinically significant macular edema; DME diabetic macular edema; dbh dot blot hemorrhages; CWS cotton wool spot; POAG primary open angle glaucoma; C/D cup-to-disc ratio; HVF humphrey visual field; GVF goldmann visual field; OCT optical coherence tomography; IOP intraocular pressure; BRVO Branch retinal vein occlusion; CRVO central retinal vein occlusion; CRAO central retinal artery occlusion; BRAO branch retinal artery occlusion; RT retinal tear; SB scleral buckle; PPV pars plana vitrectomy; VH Vitreous hemorrhage; PRP panretinal laser photocoagulation; IVK intravitreal kenalog; VMT vitreomacular traction; MH Macular hole;  NVD neovascularization of the disc; NVE neovascularization elsewhere; AREDS age related eye disease study; ARMD age related macular degeneration; POAG primary open angle glaucoma; EBMD epithelial/anterior basement membrane dystrophy; ACIOL anterior chamber intraocular lens; IOL intraocular lens; PCIOL posterior chamber intraocular lens; Phaco/IOL phacoemulsification with intraocular lens placement; PRK photorefractive keratectomy; LASIK laser assisted in situ keratomileusis; HTN hypertension; DM diabetes mellitus; COPD chronic obstructive pulmonary disease

## 2023-05-29 ENCOUNTER — Encounter (INDEPENDENT_AMBULATORY_CARE_PROVIDER_SITE_OTHER): Payer: Self-pay | Admitting: Ophthalmology

## 2023-05-29 ENCOUNTER — Ambulatory Visit (INDEPENDENT_AMBULATORY_CARE_PROVIDER_SITE_OTHER): Payer: Medicare Other | Admitting: Ophthalmology

## 2023-05-29 DIAGNOSIS — Z7985 Long-term (current) use of injectable non-insulin antidiabetic drugs: Secondary | ICD-10-CM | POA: Diagnosis not present

## 2023-05-29 DIAGNOSIS — E113313 Type 2 diabetes mellitus with moderate nonproliferative diabetic retinopathy with macular edema, bilateral: Secondary | ICD-10-CM

## 2023-05-29 DIAGNOSIS — Z961 Presence of intraocular lens: Secondary | ICD-10-CM | POA: Diagnosis not present

## 2023-05-29 DIAGNOSIS — Z794 Long term (current) use of insulin: Secondary | ICD-10-CM | POA: Diagnosis not present

## 2023-05-29 DIAGNOSIS — I10 Essential (primary) hypertension: Secondary | ICD-10-CM | POA: Diagnosis not present

## 2023-05-29 DIAGNOSIS — H35033 Hypertensive retinopathy, bilateral: Secondary | ICD-10-CM | POA: Diagnosis not present

## 2023-05-29 DIAGNOSIS — H35373 Puckering of macula, bilateral: Secondary | ICD-10-CM

## 2023-05-29 DIAGNOSIS — Z7984 Long term (current) use of oral hypoglycemic drugs: Secondary | ICD-10-CM

## 2023-05-29 MED ORDER — BROMFENAC SODIUM 0.07 % OP SOLN: 1.0000 [drp] | Freq: Four times a day (QID) | OPHTHALMIC | 6 refills | Status: DC

## 2023-05-29 MED ORDER — AFLIBERCEPT 2MG/0.05ML IZ SOLN FOR KALEIDOSCOPE
2.0000 mg | INTRAVITREAL | Status: AC | PRN
Start: 2023-05-29 — End: 2023-05-29
  Administered 2023-05-29: 2 mg via INTRAVITREAL

## 2023-06-11 DIAGNOSIS — E1142 Type 2 diabetes mellitus with diabetic polyneuropathy: Secondary | ICD-10-CM | POA: Diagnosis not present

## 2023-06-11 DIAGNOSIS — B351 Tinea unguium: Secondary | ICD-10-CM | POA: Diagnosis not present

## 2023-06-11 DIAGNOSIS — L84 Corns and callosities: Secondary | ICD-10-CM | POA: Diagnosis not present

## 2023-06-11 DIAGNOSIS — M79676 Pain in unspecified toe(s): Secondary | ICD-10-CM | POA: Diagnosis not present

## 2023-06-24 ENCOUNTER — Inpatient Hospital Stay (HOSPITAL_COMMUNITY)
Admission: EM | Admit: 2023-06-24 | Discharge: 2023-06-26 | DRG: 372 | Disposition: A | Discharge status: Home / Self Care | Payer: Medicare Other | Attending: Family Medicine | Admitting: Family Medicine

## 2023-06-24 ENCOUNTER — Other Ambulatory Visit: Payer: Self-pay

## 2023-06-24 ENCOUNTER — Encounter (HOSPITAL_COMMUNITY): Payer: Self-pay

## 2023-06-24 DIAGNOSIS — I2510 Atherosclerotic heart disease of native coronary artery without angina pectoris: Secondary | ICD-10-CM | POA: Diagnosis not present

## 2023-06-24 DIAGNOSIS — E785 Hyperlipidemia, unspecified: Secondary | ICD-10-CM | POA: Diagnosis present

## 2023-06-24 DIAGNOSIS — Z880 Allergy status to penicillin: Secondary | ICD-10-CM

## 2023-06-24 DIAGNOSIS — Z7985 Long-term (current) use of injectable non-insulin antidiabetic drugs: Secondary | ICD-10-CM | POA: Diagnosis not present

## 2023-06-24 DIAGNOSIS — E039 Hypothyroidism, unspecified: Secondary | ICD-10-CM | POA: Diagnosis present

## 2023-06-24 DIAGNOSIS — R197 Diarrhea, unspecified: Secondary | ICD-10-CM | POA: Diagnosis present

## 2023-06-24 DIAGNOSIS — Z794 Long term (current) use of insulin: Secondary | ICD-10-CM | POA: Diagnosis not present

## 2023-06-24 DIAGNOSIS — E113293 Type 2 diabetes mellitus with mild nonproliferative diabetic retinopathy without macular edema, bilateral: Secondary | ICD-10-CM | POA: Diagnosis not present

## 2023-06-24 DIAGNOSIS — E861 Hypovolemia: Secondary | ICD-10-CM | POA: Diagnosis not present

## 2023-06-24 DIAGNOSIS — A045 Campylobacter enteritis: Principal | ICD-10-CM | POA: Diagnosis present

## 2023-06-24 DIAGNOSIS — Z79899 Other long term (current) drug therapy: Secondary | ICD-10-CM | POA: Diagnosis not present

## 2023-06-24 DIAGNOSIS — Z882 Allergy status to sulfonamides status: Secondary | ICD-10-CM

## 2023-06-24 DIAGNOSIS — Z885 Allergy status to narcotic agent status: Secondary | ICD-10-CM

## 2023-06-24 DIAGNOSIS — Z79890 Hormone replacement therapy (postmenopausal): Secondary | ICD-10-CM

## 2023-06-24 DIAGNOSIS — E871 Hypo-osmolality and hyponatremia: Secondary | ICD-10-CM | POA: Diagnosis not present

## 2023-06-24 DIAGNOSIS — I10 Essential (primary) hypertension: Secondary | ICD-10-CM | POA: Diagnosis present

## 2023-06-24 DIAGNOSIS — E782 Mixed hyperlipidemia: Secondary | ICD-10-CM | POA: Diagnosis not present

## 2023-06-24 DIAGNOSIS — Z800 Family history of malignant neoplasm of digestive organs: Secondary | ICD-10-CM

## 2023-06-24 DIAGNOSIS — Z7982 Long term (current) use of aspirin: Secondary | ICD-10-CM | POA: Diagnosis not present

## 2023-06-24 DIAGNOSIS — R531 Weakness: Secondary | ICD-10-CM

## 2023-06-24 DIAGNOSIS — Z860100 Personal history of colon polyps, unspecified: Secondary | ICD-10-CM

## 2023-06-24 DIAGNOSIS — Z833 Family history of diabetes mellitus: Secondary | ICD-10-CM | POA: Diagnosis not present

## 2023-06-24 DIAGNOSIS — E860 Dehydration: Secondary | ICD-10-CM | POA: Diagnosis present

## 2023-06-24 DIAGNOSIS — Z1152 Encounter for screening for COVID-19: Secondary | ICD-10-CM | POA: Diagnosis not present

## 2023-06-24 DIAGNOSIS — E119 Type 2 diabetes mellitus without complications: Secondary | ICD-10-CM

## 2023-06-24 DIAGNOSIS — Z809 Family history of malignant neoplasm, unspecified: Secondary | ICD-10-CM | POA: Diagnosis not present

## 2023-06-24 LAB — COMPREHENSIVE METABOLIC PANEL
ALT: 16 U/L (ref 0–44)
AST: 23 U/L (ref 15–41)
Albumin: 3.7 g/dL (ref 3.5–5.0)
Alkaline Phosphatase: 60 U/L (ref 38–126)
Anion gap: 9 (ref 5–15)
BUN: 21 mg/dL (ref 8–23)
CO2: 22 mmol/L (ref 22–32)
Calcium: 8.6 mg/dL — ABNORMAL LOW (ref 8.9–10.3)
Chloride: 91 mmol/L — ABNORMAL LOW (ref 98–111)
Creatinine, Ser: 0.76 mg/dL (ref 0.44–1.00)
GFR, Estimated: >60 mL/min (ref >60)
Glucose, Bld: 155 mg/dL — ABNORMAL HIGH (ref 70–99)
Potassium: 3.4 mmol/L — ABNORMAL LOW (ref 3.5–5.1)
Sodium: 122 mmol/L — ABNORMAL LOW (ref 135–145)
Total Bilirubin: 0.7 mg/dL (ref 0.0–1.2)
Total Protein: 7.4 g/dL (ref 6.5–8.1)

## 2023-06-24 LAB — CBC WITH DIFFERENTIAL/PLATELET
Abs Immature Granulocytes: 0.03 10*3/uL (ref 0.00–0.07)
Basophils Absolute: 0 10*3/uL (ref 0.0–0.1)
Basophils Relative: 0 %
Eosinophils Absolute: 0.1 10*3/uL (ref 0.0–0.5)
Eosinophils Relative: 1 %
HCT: 35.7 % — ABNORMAL LOW (ref 36.0–46.0)
Hemoglobin: 11.8 g/dL — ABNORMAL LOW (ref 12.0–15.0)
Immature Granulocytes: 0 %
Lymphocytes Relative: 13 %
Lymphs Abs: 1 10*3/uL (ref 0.7–4.0)
MCH: 28 pg (ref 26.0–34.0)
MCHC: 33.1 g/dL (ref 30.0–36.0)
MCV: 84.6 fL (ref 80.0–100.0)
Monocytes Absolute: 0.8 10*3/uL (ref 0.1–1.0)
Monocytes Relative: 11 %
Neutro Abs: 5.9 10*3/uL (ref 1.7–7.7)
Neutrophils Relative %: 75 %
Platelets: 161 10*3/uL (ref 150–400)
RBC: 4.22 MIL/uL (ref 3.87–5.11)
RDW: 13.8 % (ref 11.5–15.5)
WBC: 7.8 10*3/uL (ref 4.0–10.5)
nRBC: 0 % (ref 0.0–0.2)

## 2023-06-24 LAB — RESP PANEL BY RT-PCR (RSV, FLU A&B, COVID)  RVPGX2
Influenza A by PCR: NEGATIVE
Influenza B by PCR: NEGATIVE
Resp Syncytial Virus by PCR: NEGATIVE
SARS Coronavirus 2 by RT PCR: NEGATIVE

## 2023-06-24 LAB — CBC
HCT: 31.4 % — ABNORMAL LOW (ref 36.0–46.0)
Hemoglobin: 10.7 g/dL — ABNORMAL LOW (ref 12.0–15.0)
MCH: 28.2 pg (ref 26.0–34.0)
MCHC: 34.1 g/dL (ref 30.0–36.0)
MCV: 82.6 fL (ref 80.0–100.0)
Platelets: 158 10*3/uL (ref 150–400)
RBC: 3.8 MIL/uL — ABNORMAL LOW (ref 3.87–5.11)
RDW: 13.6 % (ref 11.5–15.5)
WBC: 7.8 10*3/uL (ref 4.0–10.5)
nRBC: 0 % (ref 0.0–0.2)

## 2023-06-24 LAB — URINALYSIS, ROUTINE W REFLEX MICROSCOPIC
Bacteria, UA: NONE SEEN
Bilirubin Urine: NEGATIVE
Glucose, UA: >=500 mg/dL — AB
Hgb urine dipstick: NEGATIVE
Ketones, ur: NEGATIVE mg/dL
Nitrite: NEGATIVE
Protein, ur: NEGATIVE mg/dL
Specific Gravity, Urine: 1.006 (ref 1.005–1.030)
pH: 5 (ref 5.0–8.0)

## 2023-06-24 LAB — CREATININE, SERUM
Creatinine, Ser: 0.69 mg/dL (ref 0.44–1.00)
GFR, Estimated: >60 mL/min (ref >60)

## 2023-06-24 LAB — SODIUM: Sodium: 123 mmol/L — ABNORMAL LOW (ref 135–145)

## 2023-06-24 LAB — CBG MONITORING, ED: Glucose-Capillary: 142 mg/dL — ABNORMAL HIGH (ref 70–99)

## 2023-06-24 MED ORDER — POTASSIUM CHLORIDE 20 MEQ PO PACK
40.0000 meq | PACK | ORAL | Status: AC
  Administered 2023-06-24: 40 meq via ORAL
  Filled 2023-06-24: qty 2

## 2023-06-24 MED ORDER — ONDANSETRON HCL 4 MG PO TABS: 4.0000 mg | ORAL_TABLET | Freq: Four times a day (QID) | ORAL | Status: DC | PRN

## 2023-06-24 MED ORDER — BISACODYL 5 MG PO TBEC: 5.0000 mg | DELAYED_RELEASE_TABLET | Freq: Every day | ORAL | Status: DC | PRN

## 2023-06-24 MED ORDER — ACETAMINOPHEN 650 MG RE SUPP: 650.0000 mg | Freq: Four times a day (QID) | RECTAL | Status: DC | PRN

## 2023-06-24 MED ORDER — ENOXAPARIN SODIUM 40 MG/0.4ML IJ SOSY
40.0000 mg | PREFILLED_SYRINGE | INTRAMUSCULAR | Status: DC
Start: 2023-06-24 — End: 2023-06-26
  Administered 2023-06-24 – 2023-06-25 (×2): 40 mg via SUBCUTANEOUS
  Filled 2023-06-24 (×2): qty 0.4

## 2023-06-24 MED ORDER — POLYETHYLENE GLYCOL 3350 17 G PO PACK
17.0000 g | PACK | Freq: Every day | ORAL | Status: DC
  Administered 2023-06-24: 17 g via ORAL
  Filled 2023-06-24: qty 1

## 2023-06-24 MED ORDER — POTASSIUM CHLORIDE IN NACL 40-0.9 MEQ/L-% IV SOLN
INTRAVENOUS | Status: AC
  Filled 2023-06-24 (×2): qty 1000

## 2023-06-24 MED ORDER — SODIUM CHLORIDE 0.9 % IV BOLUS
500.0000 mL | Freq: Once | INTRAVENOUS | Status: AC
  Administered 2023-06-24: 500 mL via INTRAVENOUS

## 2023-06-24 MED ORDER — ALUM & MAG HYDROXIDE-SIMETH 200-200-20 MG/5ML PO SUSP
30.0000 mL | Freq: Once | ORAL | Status: AC
  Administered 2023-06-24: 30 mL via ORAL
  Filled 2023-06-24: qty 30

## 2023-06-24 MED ORDER — ACETAMINOPHEN 325 MG PO TABS: 650.0000 mg | ORAL_TABLET | Freq: Four times a day (QID) | ORAL | Status: DC | PRN

## 2023-06-24 MED ORDER — ONDANSETRON HCL 4 MG/2ML IJ SOLN: 4.0000 mg | Freq: Four times a day (QID) | INTRAMUSCULAR | Status: DC | PRN

## 2023-06-24 MED ORDER — LIDOCAINE VISCOUS HCL 2 % MT SOLN
15.0000 mL | Freq: Once | OROMUCOSAL | Status: AC
  Administered 2023-06-24: 15 mL via ORAL
  Filled 2023-06-24: qty 15

## 2023-06-24 NOTE — ED Provider Notes (Signed)
 [place] EMERGENCY DEPARTMENT AT [facility] Provider Note   CSN: [id number] Arrival date & time: 06/24/23  9080     History  Chief Complaint  Patient presents with   Diarrhea    Megan Schroeder is a 78 y.o. female, hx of DMII, HTN, who presents to the ED 2/2 to diarrhea after eating some soup on 1/8, at a restaurant.  She states that she has had 8-10 episodes of diarrhea daily, that are loose and watery, and nonbloody.  States she cannot hardly keep anything down, except for Pedialyte and water .  She had some chills on Saturday, but no fevers.  She has tried Imodium without relief.  She denies any dizziness, lightheadedness, but states that she has a headache, just feels very weak.  She has not had any changes in medications or recent antibiotics.  No one else in the family sick  Home Medications Prior to Admission medications   Medication Sig Start Date End Date Taking? Authorizing Provider  amLODipine  (NORVASC ) 10 MG tablet Take 1 tablet (10 mg total) by mouth daily. For BP 05/17/20  Yes Emokpae, Courage, MD  aspirin  EC 81 MG tablet Take 1 tablet (81 mg total) by mouth daily with breakfast. Patient taking differently: Take 81 mg by mouth daily as needed for mild pain (pain score 1-3). 05/17/20  Yes Emokpae, Courage, MD  atorvastatin  (LIPITOR) 40 MG tablet Take 40 mg by mouth daily.   Yes [provider]  Bromfenac  Sodium (PROLENSA ) 0.07 % SOLN PLACE 1 DROP IN THE RIGHT EYE FOUR TIMES DAILY 03/12/23  Yes Valdemar Rogue, MD  Bromfenac  Sodium 0.07 % SOLN Place 1 drop into the right eye 4 (four) times daily. 05/29/23  Yes Valdemar Rogue, MD  cholecalciferol  (VITAMIN D ) 1000 UNITS tablet Take 1,000 Units by mouth daily.    Yes [provider]  Coenzyme Q10 (COQ-10) 200 MG CAPS Take 200 mg by mouth daily.    Yes [provider]  dapagliflozin  propanediol (FARXIGA ) 10 MG TABS tablet Take 1 tablet (10 mg total) by mouth daily. 05/17/20  Yes Emokpae, Courage, MD   Dulaglutide (TRULICITY) 4.5 MG/0.5ML SOPN Inject 4.5 mg into the skin every Sunday.   Yes [provider]  hydrochlorothiazide  (HYDRODIURIL ) 12.5 MG tablet Take 12.5 mg by mouth daily.   Yes [provider]  levothyroxine  (SYNTHROID ) 75 MCG tablet Take 1 tablet (75 mcg total) by mouth daily before breakfast. 05/17/20  Yes Emokpae, Courage, MD  lisinopril (ZESTRIL) 40 MG tablet Take 40 mg by mouth daily.   Yes [provider]  prednisoLONE  acetate (PRED FORTE ) 1 % ophthalmic suspension INSTILL 1 DROP IN THE RIGHT EYE FOUR TIMES DAILY Patient taking differently: Place 1 drop into the right eye in the morning and at bedtime. 06/06/22  Yes Valdemar Rogue, MD  TOUJEO  SOLOSTAR 300 UNIT/ML SOPN Inject 40 Units into the skin at bedtime. 06/17/19  Yes [provider]  GNP ULTICARE PEN NEEDLES 31G X 5 MM MISC See admin instructions. 09/07/20   [provider]      Allergies    Penicillins, Hydrocodone , and Sulfa antibiotics    Review of Systems   Review of Systems  Gastrointestinal:  Positive for diarrhea. Negative for abdominal pain.    Physical Exam Updated Vital Signs BP 130/81   Pulse 80   Temp 97.9 F (36.6 C) (Oral)   Resp 16   Ht 5' 3 (1.6 m)   Wt 66 kg   SpO2 96%  BMI 25.77 kg/m  Physical Exam Vitals and nursing note reviewed.  Constitutional:      General: She is not in acute distress.    Appearance: She is well-developed.  HENT:     Head: Normocephalic and atraumatic.     Mouth/Throat:     Mouth: Mucous membranes are dry.  Eyes:     Conjunctiva/sclera: Conjunctivae normal.  Cardiovascular:     Rate and Rhythm: Normal rate and regular rhythm.     Heart sounds: No murmur heard. Pulmonary:     Effort: Pulmonary effort is normal. No respiratory distress.     Breath sounds: Normal breath sounds.  Abdominal:     Palpations: Abdomen is soft.     Tenderness: There is no abdominal tenderness.  Musculoskeletal:        General: No  swelling.     Cervical back: Neck supple.  Skin:    General: Skin is warm and dry.     Capillary Refill: Capillary refill takes less than 2 seconds.  Neurological:     Mental Status: She is alert.  Psychiatric:        Mood and Affect: Mood normal.     ED Results / Procedures / Treatments   Labs (all labs ordered are listed, but only abnormal results are displayed) Labs Reviewed  URINALYSIS, ROUTINE W REFLEX MICROSCOPIC - Abnormal; Notable for the following components:      Result Value   APPearance HAZY (*)    Glucose, UA >=500 (*)    Leukocytes,Ua Eddith Mentor (*)    Non Squamous Epithelial 0-5 (*)    All other components within normal limits  CBC WITH DIFFERENTIAL/PLATELET - Abnormal; Notable for the following components:   Hemoglobin 11.8 (*)    HCT 35.7 (*)    All other components within normal limits  COMPREHENSIVE METABOLIC PANEL - Abnormal; Notable for the following components:   Sodium 122 (*)    Potassium 3.4 (*)    Chloride 91 (*)    Glucose, Bld 155 (*)    Calcium  8.6 (*)    All other components within normal limits  SODIUM - Abnormal; Notable for the following components:   Sodium 123 (*)    All other components within normal limits  CBC - Abnormal; Notable for the following components:   RBC 3.80 (*)    Hemoglobin 10.7 (*)    HCT 31.4 (*)    All other components within normal limits  CBG MONITORING, ED - Abnormal; Notable for the following components:   Glucose-Capillary 142 (*)    All other components within normal limits  RESP PANEL BY RT-PCR (RSV, FLU A&B, COVID)  [id number]  CREATININE, SERUM  BASIC METABOLIC PANEL  CBC    EKG EKG Interpretation Date/Time:  Monday June 24 2023 16:44:15 EST Ventricular Rate:  76 PR Interval:  227 QRS Duration:  79 QT Interval:  384 QTC Calculation: 432 R Axis:   72  Text Interpretation: Sinus rhythm Prolonged PR interval Low voltage, precordial leads Confirmed by Yolande Charleston [id number]) on 06/24/2023 4:59:59  PM  Radiology No results found.  Procedures Procedures    Medications Ordered in ED Medications  acetaminophen  (TYLENOL ) tablet 650 mg (has no administration in time range)    Or  acetaminophen  (TYLENOL ) suppository 650 mg (has no administration in time range)  bisacodyl  (DULCOLAX) EC tablet 5 mg (has no administration in time range)  enoxaparin  (LOVENOX ) injection 40 mg (40 mg Subcutaneous Given 06/24/23 1856)  ondansetron  (ZOFRAN ) tablet 4 mg (  has no administration in time range)    Or  ondansetron  (ZOFRAN ) injection 4 mg (has no administration in time range)  0.9 % NaCl with KCl 40 mEq / L  infusion (has no administration in time range)  sodium chloride  0.9 % bolus 500 mL (0 mLs Intravenous Stopped 06/24/23 1939)  alum & mag hydroxide-simeth (MAALOX/MYLANTA) 200-200-20 MG/5ML suspension 30 mL (30 mLs Oral Given 06/24/23 1645)    And  lidocaine  (XYLOCAINE ) 2 % viscous mouth solution 15 mL (15 mLs Oral Given 06/24/23 1645)  potassium chloride  (KLOR-CON ) packet 40 mEq (40 mEq Oral Given 06/24/23 1657)    ED Course/ Medical Decision Making/ A&P                                 Medical Decision Making Patient is a 78 year old female, here for horrible diarrhea that is been going on for the last 3 days after eating some soup at a restaurant.  She denies any fevers, chills, bloody stool.  She is overall well-appearing, with dry mucosal membranes.  Denies any kind of nausea, vomiting.  She states she has a headache, and severe weakness, and is can hardly keep anything down.  Will obtain labs, COVID/flu for further evaluation  Amount and/or Complexity of Data Reviewed Labs: ordered.    Details: Notably hyponatremic at 122, chloride low, at 91, mild hypokalemia at 3.4 Discussion of management or test interpretation with external provider(s): Patient hyponatremic, at 122, past was 133 through 6 months ago, I believe this is represents acute hyponatremia secondary to GI loss.  Given her  symptoms, acute hyponatremia, I believe that she warrants admission.  Patient is agreement with plan.  We supplemented her hypokalemia, with 40 mEq of potassium.  We will admit for hospitalist for further management.  She has no red flag symptoms, thus no stool cultures obtained at this time.  No abdominal pain thus no CT abdomen pelvis ordered. Admitted to Dr. Lenon. Was informed by RN that patient was becoming hypotensive and bradycardic. Reports weakness and fatigue, EKG ordered earlier, Dr. Lenon in room at time, she will manage appropriately.  Risk OTC drugs. Prescription drug management. Decision regarding hospitalization.   Final Clinical Impression(s) / ED Diagnoses Final diagnoses:  Diarrhea in adult patient  Hyponatremia    Rx / DC Orders ED Discharge Orders     None         Philippa, Lyle CROME, PA 06/24/23 2100    Yolande Lamar BROCKS, MD 06/26/23 1535

## 2023-06-24 NOTE — ED Triage Notes (Signed)
 Pt arrived via POV c/o diarrhea X 3 days. Pt reports she is "probably dehydrated" and is trying to drink fluids but feels weak.

## 2023-06-24 NOTE — Plan of Care (Signed)

## 2023-06-24 NOTE — H&P (Signed)
 History and Physical   ARYAHNA SPAGNA FMW:[id number] DOB: 1945-11-01 DOA: 06/24/2023 PCP: Hyacinth Honey, NP  Chief Complaint: diarrhea, weakness Historian: patient  HPI:  CARISMA TROUPE is a 78 y.o. female with a PMH significant for HTN, DM, hypothyroidism, CAD. At baseline, they live at home with their husband and are independent for ADLs.  They presented from home to the ED on 06/24/2023 with diarrhea and weakness x3 days. She states that she had soup at a restaurant and began having several episodes of watery stool since then. Denies any hematuria or hematochezia. Associated with abdominal cramping. Had initial improvement on day following onset but has gotten worse again. Had 2 watery BMs today. Tried 4 tablets of immodium on Saturday without relief. Has been able to tolerate pedialyte and water . Denies nausea or vomiting. No other known sick contacts or others who shared her food. She states that she felt febrile intermittently but had no measured fever.  Tried to eat applesauce today but had abdominal pain and has not attempted any other solid foods for days.   In the ED, it was found that they had stable vital signs.  Significant findings included: Na+ 122>123, K+ 3.4, glucose 155, Cr 0.76. urinalysis not grossly positive for infection and she denies urinary symptoms.   They were initially treated with viscous lidocaine , maalox, potassium, IVF bolus.   Patient was admitted to medicine service for further workup and management of hypovolemic hyponatremia as outlined in detail below.  Assessment/Plan Principal Problem:   Hyponatremia Active Problems:   Diarrhea in adult patient   Hypovolemic hyponatremia- 2/2 diarrheal illness. Likely viral enteritis. Abdominal exam is benign. No abdominal imaging performed in ED and I think this is appropriate given hx/exam.  - encourage regular diet intake - IVF - electrolyte management  - PT/OT  Hyponatremia- 122>123 - IVF - BMP  am  HTN-  - holding home therapy for hypovolemia initially. Titrate back on PRN  Hypothyroidism - continue home levothyroxine   CAD- no acute CP, SOB - continue home ASA, statin  Past Medical History:  Diagnosis Date   Anxiety    CAD (coronary artery disease)    Diabetic retinopathy (HCC)    NPDR OU   Hyperlipidemia    Hypertension    Hypertensive retinopathy    OU   Hypothyroidism    Type 2 diabetes mellitus (HCC)     Past Surgical History:  Procedure Laterality Date   BREAST EXCISIONAL BIOPSY Left    CATARACT EXTRACTION W/PHACO Left 12/16/2017   Procedure: CATARACT EXTRACTION PHACO AND INTRAOCULAR LENS PLACEMENT (IOC);  Surgeon: Perley Hamilton, MD;  Location: [facility];  Service: Ophthalmology;  Laterality: Left;  CDE: 11.65   CATARACT EXTRACTION W/PHACO Right 12/30/2017   Procedure: CATARACT EXTRACTION PHACO AND INTRAOCULAR LENS PLACEMENT RIGHT EYE;  Surgeon: Perley Hamilton, MD;  Location: [facility];  Service: Ophthalmology;  Laterality: Right;  CDE: 9.07   COLONOSCOPY N/A 09/15/2015   Dr. Tyra multiple rectal and colonic polyps removed. Hepatic flexure with 9 mm polyp, multiple 5-7 mm polyps. Multiple cecal polyps with largest 1.5 cm, one 5 mm polyp in rectum. Path for colonic polyps with sessile serrated polyp without dysplasia, no high grade dysplasia, tubular adenomas, rectal hyperplastic polyp   COLONOSCOPY WITH PROPOFOL  N/A 07/20/2019   multiple tubular adenomas. diverticulosis. 3 year surveillance   COLONOSCOPY WITH PROPOFOL  N/A 02/20/2023   Procedure: COLONOSCOPY WITH PROPOFOL ;  Surgeon: Shaaron Lamar HERO, MD;  Location: [facility];  Service: Endoscopy;  Laterality: N/A;  730am, asa 3   EYE SURGERY Bilateral 2019   Cat Sx - Dr. Cherene Mania   LEFT HEART CATHETERIZATION WITH CORONARY ANGIOGRAM N/A 07/23/2011   Procedure: LEFT HEART CATHETERIZATION WITH CORONARY ANGIOGRAM;  Surgeon: Maude JAYSON Emmer, MD;  Location: [facility] CATH LAB;  Service: Cardiovascular;  Laterality: N/A;    POLYPECTOMY  07/20/2019   Procedure: POLYPECTOMY;  Surgeon: Shaaron Lamar HERO, MD;  Location: [facility];  Service: Endoscopy;;   POLYPECTOMY  02/20/2023   Procedure: POLYPECTOMY INTESTINAL;  Surgeon: Shaaron Lamar HERO, MD;  Location: [facility];  Service: Endoscopy;;   YAG LASER APPLICATION Right 09/01/2020   Dr. Redell Hans   YAG LASER APPLICATION Left 09/01/2020   Dr. Redell Hans     reports that she has never smoked. She has never used smokeless tobacco. She reports that she does not drink alcohol and does not use drugs.  Allergies  Allergen Reactions   Penicillins Hives        Hydrocodone  Nausea And Vomiting   Sulfa Antibiotics Rash    Family History  Problem Relation Age of Onset   Cancer Father    Diabetes Father    Colon cancer Sister     Prior to Admission medications   Medication Sig Start Date End Date Taking? Authorizing Provider  amLODipine  (NORVASC ) 10 MG tablet Take 1 tablet (10 mg total) by mouth daily. For BP 05/17/20   Emokpae, Courage, MD  Ascorbic Acid  (VITAMIN C) 1000 MG tablet Take 1,000 mg by mouth daily.    [provider]  aspirin  EC 81 MG tablet Take 1 tablet (81 mg total) by mouth daily with breakfast. 05/17/20   Emokpae, Courage, MD  atorvastatin  (LIPITOR) 40 MG tablet Take 40 mg by mouth daily.    [provider]  Bromfenac  Sodium (PROLENSA ) 0.07 % SOLN PLACE 1 DROP IN THE RIGHT EYE FOUR TIMES DAILY 03/12/23   Hans Redell, MD  Bromfenac  Sodium 0.07 % SOLN Place 1 drop into the right eye 4 (four) times daily. 05/29/23   Hans Redell, MD  calcium  carbonate (OS-CAL) 600 MG tablet Take 600 mg by mouth daily.    [provider]  cholecalciferol  (VITAMIN D ) 1000 UNITS tablet Take 1,000 Units by mouth daily.     [provider]  Coenzyme Q10 (COQ-10) 200 MG CAPS Take 200 mg by mouth daily.     [provider]  dapagliflozin  propanediol (FARXIGA ) 10 MG TABS tablet Take 1 tablet (10 mg total) by mouth daily.  05/17/20   Pearlean Manus, MD  Dulaglutide (TRULICITY) 4.5 MG/0.5ML SOPN Inject 4.5 mg into the skin every Sunday.    [provider]  GNP ULTICARE PEN NEEDLES 31G X 5 MM MISC See admin instructions. 09/07/20   [provider]  hydrochlorothiazide  (HYDRODIURIL ) 12.5 MG tablet Take 12.5 mg by mouth daily.    [provider]  levothyroxine  (SYNTHROID ) 75 MCG tablet Take 1 tablet (75 mcg total) by mouth daily before breakfast. 05/17/20   Emokpae, Courage, MD  lisinopril (ZESTRIL) 40 MG tablet Take 40 mg by mouth daily.    [provider]  Na Sulfate-K Sulfate-Mg Sulf 17.5-3.13-1.6 GM/177ML SOLN As directed 10/26/22   Rourk, Lamar HERO, MD  prednisoLONE  acetate (PRED FORTE ) 1 % ophthalmic suspension INSTILL 1 DROP IN THE RIGHT EYE FOUR TIMES DAILY Patient taking differently: Place 1 drop into the right eye in the morning and at bedtime. 06/06/22   Hans Redell, MD  TOUJEO  SOLOSTAR 300 UNIT/ML SOPN Inject  40 Units into the skin at bedtime. 06/17/19   [provider]   I have personally, briefly reviewed patient's prior medical records in [facility]  Objective: Blood pressure (!) 127/54, pulse 72, temperature 98.4 F (36.9 C), temperature source Oral, resp. rate 17, height 5' 3 (1.6 m), weight 66 kg, SpO2 95%.   Constitutional: NAD, calm, comfortable HEENT: lids and conjunctivae normal. MMM. Posterior pharynx clear of any exudate or lesions. Normal dentition.  Neck: normal, supple, no masses, no thyromegaly Respiratory: CTAB, no wheezing, no crackles. Normal respiratory effort. No accessory muscle use.  Cardiovascular: RRR, no murmurs / rubs / gallops. No extremity edema. 2+ pedal pulses. no clubbing / cyanosis.  Abdomen: soft, NT, ND, no masses or HSM palpated. Musculoskeletal: No joint deformity upper and lower extremities. Normal muscle tone.  Skin: dry, intact, normal color, normal temperature on exposed skin Neurologic: Alert and oriented x 3.  Normal speech. Grossly non-focal exam. PERRL Psychiatric: Normal mood. Congruent affect.  Labs on Admission: I have personally reviewed admission labs and imaging studies  CBC    Component Value Date/Time   WBC 7.8 06/24/2023 1812   RBC 3.80 (L) 06/24/2023 1812   HGB 10.7 (L) 06/24/2023 1812   HCT 31.4 (L) 06/24/2023 1812   PLT 158 06/24/2023 1812   MCV 82.6 06/24/2023 1812   MCH 28.2 06/24/2023 1812   MCHC 34.1 06/24/2023 1812   RDW 13.6 06/24/2023 1812   LYMPHSABS 1.0 06/24/2023 1041   MONOABS 0.8 06/24/2023 1041   EOSABS 0.1 06/24/2023 1041   BASOSABS 0.0 06/24/2023 1041   CMP     Component Value Date/Time   NA 123 (L) 06/24/2023 1812   K 3.4 (L) 06/24/2023 1041   CL 91 (L) 06/24/2023 1041   CO2 22 06/24/2023 1041   GLUCOSE 155 (H) 06/24/2023 1041   BUN 21 06/24/2023 1041   CREATININE 0.69 06/24/2023 1812   CALCIUM  8.6 (L) 06/24/2023 1041   PROT 7.4 06/24/2023 1041   ALBUMIN 3.7 06/24/2023 1041   AST 23 06/24/2023 1041   ALT 16 06/24/2023 1041   ALKPHOS 60 06/24/2023 1041   BILITOT 0.7 06/24/2023 1041   GFRNONAA >60 06/24/2023 1812   GFRAA >60 07/17/2019 1045    Radiological Exams on Admission: No results found.  EKG: Independently reviewed. NSR  DVT prophylaxis: enoxaparin  (LOVENOX ) injection 40 mg Start: 06/24/23 1800   Code Status: full  Family Communication: husband at bedside  Disposition Plan: admit to telemetry   Consults called: none   Marien LITTIE Piety, DO [facility]  06/24/2023, 9:51 PM    To contact the appropriate [facility] Attending or Consulting provider: Check [email or web address] for coverage from 7pm-7am

## 2023-06-24 NOTE — Progress Notes (Signed)
 Patient arrived to room. Alert and oriented x4. VSS. Patient resting comfortably in bed. Husband present in room and is spending the night. Call bell within reach.

## 2023-06-24 NOTE — ED Notes (Signed)
 Between 521pm and 523pm pt had a moment of near syncope. Pt was unable to answer questions but never loss consciousness. After episode pt was able to answer questions completely. Stated I just feel so weak.

## 2023-06-25 DIAGNOSIS — Z1152 Encounter for screening for COVID-19: Secondary | ICD-10-CM | POA: Diagnosis not present

## 2023-06-25 DIAGNOSIS — E113293 Type 2 diabetes mellitus with mild nonproliferative diabetic retinopathy without macular edema, bilateral: Secondary | ICD-10-CM | POA: Diagnosis present

## 2023-06-25 DIAGNOSIS — R531 Weakness: Secondary | ICD-10-CM

## 2023-06-25 DIAGNOSIS — E861 Hypovolemia: Secondary | ICD-10-CM | POA: Diagnosis present

## 2023-06-25 DIAGNOSIS — Z800 Family history of malignant neoplasm of digestive organs: Secondary | ICD-10-CM | POA: Diagnosis not present

## 2023-06-25 DIAGNOSIS — Z7982 Long term (current) use of aspirin: Secondary | ICD-10-CM | POA: Diagnosis not present

## 2023-06-25 DIAGNOSIS — E871 Hypo-osmolality and hyponatremia: Secondary | ICD-10-CM | POA: Diagnosis not present

## 2023-06-25 DIAGNOSIS — Z882 Allergy status to sulfonamides status: Secondary | ICD-10-CM | POA: Diagnosis not present

## 2023-06-25 DIAGNOSIS — Z809 Family history of malignant neoplasm, unspecified: Secondary | ICD-10-CM | POA: Diagnosis not present

## 2023-06-25 DIAGNOSIS — Z79890 Hormone replacement therapy (postmenopausal): Secondary | ICD-10-CM | POA: Diagnosis not present

## 2023-06-25 DIAGNOSIS — I2510 Atherosclerotic heart disease of native coronary artery without angina pectoris: Secondary | ICD-10-CM | POA: Diagnosis present

## 2023-06-25 DIAGNOSIS — Z885 Allergy status to narcotic agent status: Secondary | ICD-10-CM | POA: Diagnosis not present

## 2023-06-25 DIAGNOSIS — R197 Diarrhea, unspecified: Secondary | ICD-10-CM | POA: Diagnosis not present

## 2023-06-25 DIAGNOSIS — E039 Hypothyroidism, unspecified: Secondary | ICD-10-CM | POA: Diagnosis not present

## 2023-06-25 DIAGNOSIS — E119 Type 2 diabetes mellitus without complications: Secondary | ICD-10-CM | POA: Diagnosis present

## 2023-06-25 DIAGNOSIS — Z833 Family history of diabetes mellitus: Secondary | ICD-10-CM | POA: Diagnosis not present

## 2023-06-25 DIAGNOSIS — Z794 Long term (current) use of insulin: Secondary | ICD-10-CM | POA: Diagnosis not present

## 2023-06-25 DIAGNOSIS — Z79899 Other long term (current) drug therapy: Secondary | ICD-10-CM | POA: Diagnosis not present

## 2023-06-25 DIAGNOSIS — I10 Essential (primary) hypertension: Secondary | ICD-10-CM | POA: Diagnosis not present

## 2023-06-25 DIAGNOSIS — E782 Mixed hyperlipidemia: Secondary | ICD-10-CM | POA: Diagnosis present

## 2023-06-25 DIAGNOSIS — Z880 Allergy status to penicillin: Secondary | ICD-10-CM | POA: Diagnosis not present

## 2023-06-25 DIAGNOSIS — E860 Dehydration: Secondary | ICD-10-CM | POA: Diagnosis present

## 2023-06-25 DIAGNOSIS — A045 Campylobacter enteritis: Secondary | ICD-10-CM | POA: Diagnosis not present

## 2023-06-25 DIAGNOSIS — Z7985 Long-term (current) use of injectable non-insulin antidiabetic drugs: Secondary | ICD-10-CM | POA: Diagnosis not present

## 2023-06-25 DIAGNOSIS — Z860100 Personal history of colon polyps, unspecified: Secondary | ICD-10-CM | POA: Diagnosis not present

## 2023-06-25 LAB — BASIC METABOLIC PANEL
Anion gap: 7 (ref 5–15)
BUN: 15 mg/dL (ref 8–23)
CO2: 20 mmol/L — ABNORMAL LOW (ref 22–32)
Calcium: 8.3 mg/dL — ABNORMAL LOW (ref 8.9–10.3)
Chloride: 103 mmol/L (ref 98–111)
Creatinine, Ser: 0.57 mg/dL (ref 0.44–1.00)
GFR, Estimated: >60 mL/min (ref >60)
Glucose, Bld: 124 mg/dL — ABNORMAL HIGH (ref 70–99)
Potassium: 3.9 mmol/L (ref 3.5–5.1)
Sodium: 130 mmol/L — ABNORMAL LOW (ref 135–145)

## 2023-06-25 LAB — C DIFFICILE QUICK SCREEN W PCR REFLEX
C Diff antigen: NEGATIVE
C Diff interpretation: NOT DETECTED
C Diff toxin: NEGATIVE

## 2023-06-25 LAB — GLUCOSE, CAPILLARY
Glucose-Capillary: 203 mg/dL — ABNORMAL HIGH (ref 70–99)
Glucose-Capillary: 138 mg/dL — ABNORMAL HIGH (ref 70–99)
Glucose-Capillary: 145 mg/dL — ABNORMAL HIGH (ref 70–99)

## 2023-06-25 LAB — CBC
HCT: 30.2 % — ABNORMAL LOW (ref 36.0–46.0)
Hemoglobin: 10.2 g/dL — ABNORMAL LOW (ref 12.0–15.0)
MCH: 28.3 pg (ref 26.0–34.0)
MCHC: 33.8 g/dL (ref 30.0–36.0)
MCV: 83.9 fL (ref 80.0–100.0)
Platelets: 157 10*3/uL (ref 150–400)
RBC: 3.6 MIL/uL — ABNORMAL LOW (ref 3.87–5.11)
RDW: 13.4 % (ref 11.5–15.5)
WBC: 6 10*3/uL (ref 4.0–10.5)
nRBC: 0 % (ref 0.0–0.2)

## 2023-06-25 LAB — CK: Total CK: 147 U/L (ref 38–234)

## 2023-06-25 LAB — FOLATE: Folate: 14.4 ng/mL (ref >5.9)

## 2023-06-25 LAB — MAGNESIUM: Magnesium: 2.2 mg/dL (ref 1.7–2.4)

## 2023-06-25 LAB — TSH: TSH: 1.453 u[IU]/mL (ref 0.350–4.500)

## 2023-06-25 LAB — HEMOGLOBIN A1C
Hgb A1c MFr Bld: 7.8 % — ABNORMAL HIGH (ref 4.8–5.6)
Mean Plasma Glucose: 177.16 mg/dL

## 2023-06-25 LAB — VITAMIN B12: Vitamin B-12: 228 pg/mL (ref 180–914)

## 2023-06-25 MED ORDER — LEVOTHYROXINE SODIUM 75 MCG PO TABS
75.0000 ug | ORAL_TABLET | Freq: Every day | ORAL | Status: DC
  Administered 2023-06-25 – 2023-06-26 (×2): 75 ug via ORAL
  Filled 2023-06-25 (×2): qty 1

## 2023-06-25 MED ORDER — INSULIN ASPART 100 UNIT/ML IJ SOLN: 0.0000 [IU] | Freq: Every day | INTRAMUSCULAR | Status: DC

## 2023-06-25 MED ORDER — ASPIRIN 81 MG PO TBEC
81.0000 mg | DELAYED_RELEASE_TABLET | Freq: Every day | ORAL | Status: DC
  Administered 2023-06-25 – 2023-06-26 (×2): 81 mg via ORAL
  Filled 2023-06-25 (×2): qty 1

## 2023-06-25 MED ORDER — ATORVASTATIN CALCIUM 40 MG PO TABS
40.0000 mg | ORAL_TABLET | Freq: Every day | ORAL | Status: DC
  Administered 2023-06-25 – 2023-06-26 (×2): 40 mg via ORAL
  Filled 2023-06-25 (×2): qty 1

## 2023-06-25 MED ORDER — LACTATED RINGERS IV SOLN: INTRAVENOUS | Status: DC

## 2023-06-25 MED ORDER — INSULIN ASPART 100 UNIT/ML IJ SOLN
0.0000 [IU] | Freq: Three times a day (TID) | INTRAMUSCULAR | Status: DC
  Administered 2023-06-25: 3 [IU] via SUBCUTANEOUS
  Administered 2023-06-25 – 2023-06-26 (×2): 1 [IU] via SUBCUTANEOUS

## 2023-06-25 NOTE — Progress Notes (Signed)
 PROGRESS NOTE  Megan Schroeder FMW:[id number] DOB: 01/25/1946 DOA: 06/24/2023 PCP: Hyacinth Honey, NP  Brief History:  78 year old female with a history of hypertension, hyperlipidemia, diabetes mellitus type 2, coronary disease, and hypothyroidism presenting with generalized weakness and diarrhea.  The patient states that she ate our local restaurant on 06/22/2023.  The following day, the patient began having diarrhea anywhere from 2-5 episodes.  She denies any hematochezia or melena.  She denies any abdominal pain.  She has subjective fevers and chills.  The patient denies any new medications.  She denies any recent antibiotics.  Her husband ate at the same establishment, but had something slightly different.  She denies any nausea, vomiting, chest pain, shortness breath, rashes, arthralgias. Because of progressive generalized weakness and continued diarrhea she presented for further evaluation and treatment. In the ED, the patient was afebrile hemodynamically stable with oxygen  saturation 98% on room air.  WBC 7.8, hemoglobin 11.8, platelets 161.  Sodium 122, potassium 3.4, bicarbonate 22, serum creatinine 0.76.  LFTs were unremarkable.  The patient was started IV fluids.  She was admitted for further evaluation and treatment.   Assessment/Plan: Hyponatremia -Secondary to volume depletion -Continue IV fluids>>improving  Diarrhea -Check stool pathogen panel -Check C. difficile  Generalized weakness -Secondary to hyponatremia and dehydration -B12--228 -TSH--1.453 -CK 147 -PT evaluation -UA negative for significant pyuria  Essential hypertension -Holding HCTZ, amlodipine , lisinopril secondary to soft blood pressure  Hypothyroidism -Continue Synthroid   Diabetes mellitus type 2 -Hemoglobin A1c -NovoLog  sliding scale  Mixed hyperlipidemia -Continue statin        Family Communication:   spouse updated 1/14  Consultants:  none  Code Status:  FULL   DVT  Prophylaxis:  [place] Lovenox    Procedures: As Listed in Progress Note Above  Antibiotics: None        Subjective: Pt continues to have loose stool.  Denies f/c, cp, abd pain, hematochezia, melena, dizziness  Objective: Vitals:   06/24/23 2005 06/24/23 2250 06/25/23 0224 06/25/23 0600  BP:  110/86 (!) 103/46 (!) 110/52  Pulse:  87 81 75  Resp:  18 16 16   Temp: 97.9 F (36.6 C) 98.1 F (36.7 C) 98.8 F (37.1 C) 97.9 F (36.6 C)  TempSrc: Oral Oral Oral Oral  SpO2:  97% 99% 99%  Weight:  69.1 kg    Height:        Intake/Output Summary (Last 24 hours) at 06/25/2023 0904 Last data filed at 06/25/2023 0500 Gross per 24 hour  Intake 306.87 ml  Output --  Net 306.87 ml   Weight change:  Exam:  General:  Pt is alert, follows commands appropriately, not in acute distress HEENT: No icterus, No thrush, No neck mass, [place]/AT Cardiovascular: RRR, S1/S2, no rubs, no gallops Respiratory: CTA bilaterally, no wheezing, no crackles, no rhonchi Abdomen: Soft/+BS, non tender, non distended, no guarding Extremities: No edema, No lymphangitis, No petechiae, No rashes, no synovitis   Data Reviewed: I have personally reviewed following labs and imaging studies Basic Metabolic Panel: Recent Labs  Lab 06/24/23 1041 06/24/23 1812 06/25/23 0454  NA 122* 123* 130*  K 3.4*  --  3.9  CL 91*  --  103  CO2 22  --  20*  GLUCOSE 155*  --  124*  BUN 21  --  15  CREATININE 0.76 0.69 0.57  CALCIUM  8.6*  --  8.3*   Liver Function Tests: Recent Labs  Lab 06/24/23 1041  AST 23  ALT 16  ALKPHOS 60  BILITOT 0.7  PROT 7.4  ALBUMIN 3.7   No results for input(s): LIPASE, AMYLASE in the last 168 hours. No results for input(s): AMMONIA in the last 168 hours. Coagulation Profile: No results for input(s): INR, PROTIME in the last 168 hours. CBC: Recent Labs  Lab 06/24/23 1041 06/24/23 1812 06/25/23 0454  WBC 7.8 7.8 6.0  NEUTROABS 5.9  --   --   HGB 11.8* 10.7* 10.2*  HCT  35.7* 31.4* 30.2*  MCV 84.6 82.6 83.9  PLT 161 158 157   Cardiac Enzymes: No results for input(s): CKTOTAL, CKMB, CKMBINDEX, TROPONINI in the last 168 hours. BNP: Invalid input(s): POCBNP CBG: Recent Labs  Lab 06/24/23 1725  GLUCAP 142*   HbA1C: No results for input(s): HGBA1C in the last 72 hours. Urine analysis:    Component Value Date/Time   COLORURINE YELLOW 06/24/2023 1329   APPEARANCEUR HAZY (A) 06/24/2023 1329   LABSPEC 1.006 06/24/2023 1329   PHURINE 5.0 06/24/2023 1329   GLUCOSEU >=500 (A) 06/24/2023 1329   HGBUR NEGATIVE 06/24/2023 1329   BILIRUBINUR NEGATIVE 06/24/2023 1329   KETONESUR NEGATIVE 06/24/2023 1329   PROTEINUR NEGATIVE 06/24/2023 1329   UROBILINOGEN 4.0 (H) 10/01/2013 2025   NITRITE NEGATIVE 06/24/2023 1329   LEUKOCYTESUR SMALL (A) 06/24/2023 1329   Sepsis Labs: @LABRCNTIP (procalcitonin:4,lacticidven:4) ) Recent Results (from the past 240 hours)  Resp panel by RT-PCR (RSV, Flu A&B, Covid) Anterior Nasal Swab     Status: None   Collection Time: 06/24/23  2:06 PM   Specimen: Anterior Nasal Swab  Result Value Ref Range Status   SARS Coronavirus 2 by RT PCR NEGATIVE NEGATIVE Final    Comment: (NOTE) SARS-CoV-2 target nucleic acids are NOT DETECTED.  The SARS-CoV-2 RNA is generally detectable in upper respiratory specimens during the acute phase of infection. The lowest concentration of SARS-CoV-2 viral copies this assay can detect is 138 copies/mL. A negative result does not preclude SARS-Cov-2 infection and should not be used as the sole basis for treatment or other patient management decisions. A negative result may occur with  improper specimen collection/handling, submission of specimen other than nasopharyngeal swab, presence of viral mutation(s) within the areas targeted by this assay, and inadequate number of viral copies(<138 copies/mL). A negative result must be combined with clinical observations, patient history, and  epidemiological information. The expected result is Negative.  Fact Sheet for Patients:  bloggercourse.com  Fact Sheet for Healthcare Providers:  seriousbroker.it  This test is no t yet approved or cleared by the [place]  FDA and  has been authorized for detection and/or diagnosis of SARS-CoV-2 by FDA under an Emergency Use Authorization (EUA). This EUA will remain  in effect (meaning this test can be used) for the duration of the COVID-19 declaration under Section 564(b)(1) of the Act, 21 U.S.C.section 360bbb-3(b)(1), unless the authorization is terminated  or revoked sooner.       Influenza A by PCR NEGATIVE NEGATIVE Final   Influenza B by PCR NEGATIVE NEGATIVE Final    Comment: (NOTE) The Xpert Xpress SARS-CoV-2/FLU/RSV plus assay is intended as an aid in the diagnosis of influenza from Nasopharyngeal swab specimens and should not be used as a sole basis for treatment. Nasal washings and aspirates are unacceptable for Xpert Xpress SARS-CoV-2/FLU/RSV testing.  Fact Sheet for Patients: bloggercourse.com  Fact Sheet for Healthcare Providers: seriousbroker.it  This test is not yet approved or cleared by the [place]  FDA and has been authorized for detection and/or diagnosis of  SARS-CoV-2 by FDA under an Emergency Use Authorization (EUA). This EUA will remain in effect (meaning this test can be used) for the duration of the COVID-19 declaration under Section 564(b)(1) of the Act, 21 U.S.C. section 360bbb-3(b)(1), unless the authorization is terminated or revoked.     Resp Syncytial Virus by PCR NEGATIVE NEGATIVE Final    Comment: (NOTE) Fact Sheet for Patients: bloggercourse.com  Fact Sheet for Healthcare Providers: seriousbroker.it  This test is not yet approved or cleared by the [place]  FDA and has been  authorized for detection and/or diagnosis of SARS-CoV-2 by FDA under an Emergency Use Authorization (EUA). This EUA will remain in effect (meaning this test can be used) for the duration of the COVID-19 declaration under Section 564(b)(1) of the Act, 21 U.S.C. section 360bbb-3(b)(1), unless the authorization is terminated or revoked.  Performed at [facility], [address], [place], [place] [address]      Scheduled Meds:  enoxaparin  (LOVENOX ) injection  40 mg Subcutaneous Q24H   Continuous Infusions:  lactated ringers       Procedures/Studies: Intravitreal Injection, Pharmacologic Agent - OD - Right Eye Result Date: 05/29/2023 Time Out 05/29/2023. 8:20 AM. Confirmed correct patient, procedure, site, and patient consented. Anesthesia Topical anesthesia was used. Anesthetic medications included Lidocaine  2%, Proparacaine 0.5%. Procedure Preparation included 5% betadine  to ocular surface, eyelid speculum. A (32g) needle was used. Injection: 2 mg aflibercept  2 MG/0.05ML   Route: Intravitreal, Site: Right Eye   NDC: [id number], Lot: [id number], Expiration date: 11/09/2023, Waste: 0 mL Post-op Post injection exam found visual acuity of at least counting fingers. The patient tolerated the procedure well. There were no complications. The patient received written and verbal post procedure care education. Post injection medications were not given.   OCT, Retina - OU - Both Eyes Result Date: 05/29/2023 Right Eye Quality was good. Central Foveal Thickness: 472. Progression has been stable. Findings include no SRF, abnormal foveal contour, epiretinal membrane, intraretinal fluid, macular pucker (ERM with pucker -- stable; mild persistent IRF and central cyst ). Left Eye Quality was good. Central Foveal Thickness: 262. Progression has been stable. Findings include normal foveal contour, no IRF, no SRF, epiretinal membrane, macular pucker (ERM with early pucker, partial PVD). 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 CME - cystoid macular edema. PED - pigment epithelial detachment. IRF - intraretinal fluid. SRF - subretinal fluid. EZ - ellipsoid zone. ERM - epiretinal membrane. ORA - outer retinal atrophy. ORT - outer retinal tubulation. SRHM - subretinal hyper-reflective material. IRHM - intraretinal hyper-reflective material    Alm Schneider, DO  [facility]  If 7PM-7AM, please contact night-coverage [email or web address] Password [email or web address] 06/25/2023, 9:04 AM   LOS: 0 days

## 2023-06-25 NOTE — Progress Notes (Signed)
 PT Cancellation Note  Patient Details Name: Megan Schroeder MRN: [id number] DOB: [birth year]   Cancelled Treatment:    Reason Eval/Treat Not Completed: PT screened, no needs identified, will sign off.  Patient walking independently in room.   1:43 PM, 06/25/23 Lynwood Music, MPT Physical Therapist with [facility] 336 [phone] office [phone] mobile phone

## 2023-06-25 NOTE — Hospital Course (Signed)
 78 year old female with a history of hypertension, hyperlipidemia, diabetes mellitus type 2, coronary disease, and hypothyroidism presenting with generalized weakness and diarrhea.  The patient states that she ate our local restaurant on 06/22/2023.  The following day, the patient began having diarrhea anywhere from 2-5 episodes.  She denies any hematochezia or melena.  She denies any abdominal pain.  She has subjective fevers and chills.  The patient denies any new medications.  She denies any recent antibiotics.  Her husband ate at the same establishment, but had something slightly different.  She denies any nausea, vomiting, chest pain, shortness breath, rashes, arthralgias. Because of progressive generalized weakness and continued diarrhea she presented for further evaluation and treatment. In the ED, the patient was afebrile hemodynamically stable with oxygen  saturation 98% on room air.  WBC 7.8, hemoglobin 11.8, platelets 161.  Sodium 122, potassium 3.4, bicarbonate 22, serum creatinine 0.76.  LFTs were unremarkable.  The patient was started IV fluids.  She was admitted for further evaluation and treatment.

## 2023-06-25 NOTE — Progress Notes (Signed)
 Mobility Specialist Progress Note:    06/25/23 1151  Mobility  Activity Ambulated with assistance in hallway  Level of Assistance Independent  Assistive Device None  Distance Ambulated (ft) 350 ft  Range of Motion/Exercises Active;All extremities  Activity Response Tolerated well  Mobility Referral Yes  Mobility visit 1 Mobility  Mobility Specialist Start Time (ACUTE ONLY) 1115  Mobility Specialist Stop Time (ACUTE ONLY) 1130  Mobility Specialist Time Calculation (min) (ACUTE ONLY) 15 min   Pt received in bed, agreeable to mobility. Independently able to stand and ambulate with no AD. Tolerated well, asx throughout. Returned to room, left pt sitting EOB. All needs met.   Sherrilee Ditty Mobility Specialist Please contact via Special Educational Needs Teacher or  Rehab office at [phone]

## 2023-06-25 NOTE — Progress Notes (Signed)
   06/25/23 1157  TOC Brief Assessment  Insurance and Status Reviewed  Patient has primary care physician Yes  Home environment has been reviewed Home with spouse  Prior level of function: independent  Prior/Current Home Services No current home services  Social Drivers of Health Review SDOH reviewed no interventions necessary  Readmission risk has been reviewed Yes  Transition of care needs no transition of care needs at this time   Transition of Care Department [facility]) has reviewed patient and no TOC needs have been identified at this time. We will continue to monitor patient advancement through interdisciplinary progression rounds. If new patient transition needs arise, please place a TOC consult.

## 2023-06-26 ENCOUNTER — Other Ambulatory Visit (INDEPENDENT_AMBULATORY_CARE_PROVIDER_SITE_OTHER): Payer: Self-pay | Admitting: Ophthalmology

## 2023-06-26 DIAGNOSIS — I10 Essential (primary) hypertension: Secondary | ICD-10-CM | POA: Diagnosis not present

## 2023-06-26 DIAGNOSIS — E871 Hypo-osmolality and hyponatremia: Secondary | ICD-10-CM | POA: Diagnosis not present

## 2023-06-26 DIAGNOSIS — E039 Hypothyroidism, unspecified: Secondary | ICD-10-CM

## 2023-06-26 DIAGNOSIS — A045 Campylobacter enteritis: Secondary | ICD-10-CM | POA: Diagnosis present

## 2023-06-26 LAB — GASTROINTESTINAL PANEL BY PCR, STOOL (REPLACES STOOL CULTURE)

## 2023-06-26 LAB — BASIC METABOLIC PANEL
Anion gap: 8 (ref 5–15)
BUN: 8 mg/dL (ref 8–23)
CO2: 23 mmol/L (ref 22–32)
Calcium: 8.7 mg/dL — ABNORMAL LOW (ref 8.9–10.3)
Chloride: 106 mmol/L (ref 98–111)
Creatinine, Ser: 0.56 mg/dL (ref 0.44–1.00)
GFR, Estimated: >60 mL/min (ref >60)
Glucose, Bld: 120 mg/dL — ABNORMAL HIGH (ref 70–99)
Potassium: 3.8 mmol/L (ref 3.5–5.1)
Sodium: 137 mmol/L (ref 135–145)

## 2023-06-26 LAB — MAGNESIUM: Magnesium: 2.1 mg/dL (ref 1.7–2.4)

## 2023-06-26 LAB — GLUCOSE, CAPILLARY: Glucose-Capillary: 129 mg/dL — ABNORMAL HIGH (ref 70–99)

## 2023-06-26 MED ORDER — PREDNISOLONE ACETATE 1 % OP SUSP: 1.0000 [drp] | Freq: Two times a day (BID) | OPHTHALMIC | Status: DC

## 2023-06-26 MED ORDER — TOUJEO SOLOSTAR 300 UNIT/ML ~~LOC~~ SOPN: 10.0000 [IU] | PEN_INJECTOR | Freq: Every day | SUBCUTANEOUS | Status: AC

## 2023-06-26 MED ORDER — ASPIRIN EC 81 MG PO TBEC: 81.0000 mg | DELAYED_RELEASE_TABLET | Freq: Every day | ORAL | Status: DC | PRN

## 2023-06-26 NOTE — Progress Notes (Signed)
 Patient states understanding of discharge instructions.

## 2023-06-26 NOTE — Plan of Care (Signed)
   Problem: Education: Goal: Knowledge of General Education information will improve Description Including pain rating scale, medication(s)/side effects and non-pharmacologic comfort measures Outcome: Progressing   Problem: Health Behavior/Discharge Planning: Goal: Ability to manage health-related needs will improve Outcome: Progressing

## 2023-06-26 NOTE — Discharge Summary (Signed)
 Physician Discharge Summary  Megan Schroeder NGE:[id number] DOB: 1946/04/13 DOA: 06/24/2023  PCP: Wendi Ham, NP  Admit date: 06/24/2023 Discharge date: 06/26/2023  Admitted From:  HOME  Disposition: HOME   Recommendations for Outpatient Follow-up:  Follow up with PCP in 1 weeks  Discharge Condition: STABLE   CODE STATUS: FULL DIET: soft, advance as tolerated    Brief Hospitalization Summary: Please see all hospital notes, images, labs for full details of the hospitalization. Admission provider HPI:  78 year old female with a history of hypertension, hyperlipidemia, diabetes mellitus type 2, coronary disease, and hypothyroidism presenting with generalized weakness and diarrhea.  The patient states that she ate our local restaurant on 06/22/2023.  The following day, the patient began having diarrhea anywhere from 2-5 episodes.  She denies any hematochezia or melena.  She denies any abdominal pain.  She has subjective fevers and chills.  The patient denies any new medications.  She denies any recent antibiotics.  Her husband ate at the same establishment, but had something slightly different.  She denies any nausea, vomiting, chest pain, shortness breath, rashes, arthralgias. Because of progressive generalized weakness and continued diarrhea she presented for further evaluation and treatment. In the ED, the patient was afebrile hemodynamically stable with oxygen  saturation 98% on room air.  WBC 7.8, hemoglobin 11.8, platelets 161.  Sodium 122, potassium 3.4, bicarbonate 22, serum creatinine 0.76.  LFTs were unremarkable.  The patient was started IV fluids.  She was admitted for further evaluation and treatment.  Hospital Course by problem list  Campylobacter gastroenteritis  - fortunately symptoms resolved with supportive measures - diarrhea resolved now and tolerating diet - DC home   Hyponatremia - RESOLVED  -Secondary to volume depletion -treated with IV fluids   Generalized  weakness - RESOLVED  -Secondary to hyponatremia and dehydration -B12--228 -TSH--1.453 -CK 147 -PT evaluation -UA negative for significant pyuria   Essential hypertension -Held HCTZ, amlodipine , lisinopril secondary to soft blood pressure   Hypothyroidism -Continue Synthroid    Uncontrolled Diabetes mellitus type 2 -Hemoglobin A1c - 7.8%  -NovoLog  sliding scale  CBG (last 3)  Recent Labs    06/25/23 1632 06/25/23 2201 06/26/23 0715  GLUCAP 138* 145* 129*   Mixed hyperlipidemia -Continue statin  Discharge Diagnoses:  Principal Problem:   Campylobacter gastroenteritis Active Problems:   Hypertension   Diabetes mellitus (HCC)   Hyperlipidemia   Hypothyroidism   CAD (coronary artery disease)-[facility] 07/2011    Hyponatremia   Diarrhea in adult patient   Generalized weakness   Discharge Instructions:  Allergies as of 06/26/2023       Reactions   Penicillins Hives      Hydrocodone  Nausea And Vomiting   Sulfa Antibiotics Rash        Medication List     STOP taking these medications    hydrochlorothiazide  12.5 MG tablet Commonly known as: HYDRODIURIL    lisinopril 40 MG tablet Commonly known as: ZESTRIL       TAKE these medications    amLODipine  10 MG tablet Commonly known as: NORVASC  Take 1 tablet (10 mg total) by mouth daily. For BP   aspirin  EC 81 MG tablet Take 1 tablet (81 mg total) by mouth daily as needed for mild pain (pain score 1-3).   atorvastatin  40 MG tablet Commonly known as: LIPITOR Take 40 mg by mouth daily.   Bromfenac  Sodium 0.07 % Soln Place 1 drop into the right eye 4 (four) times daily. What changed: Another medication with the same name was removed. Continue  taking this medication, and follow the directions you see here.   cholecalciferol  1000 units tablet Commonly known as: VITAMIN D  Take 1,000 Units by mouth daily.   CoQ-10 200 MG Caps Take 200 mg by mouth daily.   dapagliflozin  propanediol 10 MG Tabs tablet Commonly  known as: Farxiga  Take 1 tablet (10 mg total) by mouth daily.   GNP UltiCare Pen Needles 31G X 5 MM Misc Generic drug: Insulin  Pen Needle See admin instructions.   levothyroxine  75 MCG tablet Commonly known as: SYNTHROID  Take 1 tablet (75 mcg total) by mouth daily before breakfast.   prednisoLONE  acetate 1 % ophthalmic suspension Commonly known as: PRED FORTE  Place 1 drop into the right eye in the morning and at bedtime.   Toujeo  SoloStar 300 UNIT/ML Solostar Pen Generic drug: insulin  glargine (1 Unit Dial) Inject 10 Units into the skin at bedtime. What changed: how much to take   Trulicity 4.5 MG/0.5ML Soaj Generic drug: Dulaglutide Inject 4.5 mg into the skin every Sunday.        Follow-up Information     Wendi Ham, NP Follow up in 1 week(s).   Specialty: Family Medicine Why: Hospital Follow Up Contact information: [address] [place] [address] [phone]                Allergies  Allergen Reactions   Penicillins Hives        Hydrocodone  Nausea And Vomiting   Sulfa Antibiotics Rash   Allergies as of 06/26/2023       Reactions   Penicillins Hives      Hydrocodone  Nausea And Vomiting   Sulfa Antibiotics Rash        Medication List     STOP taking these medications    hydrochlorothiazide  12.5 MG tablet Commonly known as: HYDRODIURIL    lisinopril 40 MG tablet Commonly known as: ZESTRIL       TAKE these medications    amLODipine  10 MG tablet Commonly known as: NORVASC  Take 1 tablet (10 mg total) by mouth daily. For BP   aspirin  EC 81 MG tablet Take 1 tablet (81 mg total) by mouth daily as needed for mild pain (pain score 1-3).   atorvastatin  40 MG tablet Commonly known as: LIPITOR Take 40 mg by mouth daily.   Bromfenac  Sodium 0.07 % Soln Place 1 drop into the right eye 4 (four) times daily. What changed: Another medication with the same name was removed. Continue taking this medication, and follow the  directions you see here.   cholecalciferol  1000 units tablet Commonly known as: VITAMIN D  Take 1,000 Units by mouth daily.   CoQ-10 200 MG Caps Take 200 mg by mouth daily.   dapagliflozin  propanediol 10 MG Tabs tablet Commonly known as: Farxiga  Take 1 tablet (10 mg total) by mouth daily.   GNP UltiCare Pen Needles 31G X 5 MM Misc Generic drug: Insulin  Pen Needle See admin instructions.   levothyroxine  75 MCG tablet Commonly known as: SYNTHROID  Take 1 tablet (75 mcg total) by mouth daily before breakfast.   prednisoLONE  acetate 1 % ophthalmic suspension Commonly known as: PRED FORTE  Place 1 drop into the right eye in the morning and at bedtime.   Toujeo  SoloStar 300 UNIT/ML Solostar Pen Generic drug: insulin  glargine (1 Unit Dial) Inject 10 Units into the skin at bedtime. What changed: how much to take   Trulicity 4.5 MG/0.5ML Soaj Generic drug: Dulaglutide Inject 4.5 mg into the skin every Sunday.  Procedures/Studies: Intravitreal Injection, Pharmacologic Agent - OD - Right Eye Result Date: 05/29/2023 Time Out 05/29/2023. 8:20 AM. Confirmed correct patient, procedure, site, and patient consented. Anesthesia Topical anesthesia was used. Anesthetic medications included Lidocaine  2%, Proparacaine 0.5%. Procedure Preparation included 5% betadine  to ocular surface, eyelid speculum. A (32g) needle was used. Injection: 2 mg aflibercept  2 MG/0.05ML   Route: Intravitreal, Site: Right Eye   NDC: [id number], Lot: [id number], Expiration date: 11/09/2023, Waste: 0 mL Post-op Post injection exam found visual acuity of at least counting fingers. The patient tolerated the procedure well. There were no complications. The patient received written and verbal post procedure care education. Post injection medications were not given.   OCT, Retina - OU - Both Eyes Result Date: 05/29/2023 Right Eye Quality was good. Central Foveal Thickness: 472. Progression has been stable. Findings  include no SRF, abnormal foveal contour, epiretinal membrane, intraretinal fluid, macular pucker (ERM with pucker -- stable; mild persistent IRF and central cyst ). Left Eye Quality was good. Central Foveal Thickness: 262. Progression has been stable. Findings include normal foveal contour, no IRF, no SRF, epiretinal membrane, macular pucker (ERM with early pucker, partial PVD). 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 CME - cystoid macular edema. PED - pigment epithelial detachment. IRF - intraretinal fluid. SRF - subretinal fluid. EZ - ellipsoid zone. ERM - epiretinal membrane. ORA - outer retinal atrophy. ORT - outer retinal tubulation. SRHM - subretinal hyper-reflective material. IRHM - intraretinal hyper-reflective material     Subjective: Pt reports that diarrhea resolved now, she is feeling much better and tolerating diet well.  She is agreeable to going home.   Discharge Exam: Vitals:   06/25/23 2159 06/26/23 0441  BP: (!) 121/52 135/75  Pulse: 76 71  Resp: 20 16  Temp: 99 F (37.2 C) 98.6 F (37 C)  SpO2: 99% 99%   Vitals:   06/25/23 0929 06/25/23 1349 06/25/23 2159 06/26/23 0441  BP: (!) 111/97 (!) 115/55 (!) 121/52 135/75  Pulse: 85 84 76 71  Resp: 16 17 20 16   Temp: 98.1 F (36.7 C)  99 F (37.2 C) 98.6 F (37 C)  TempSrc: Oral  Oral   SpO2: 98% 98% 99% 99%  Weight:      Height:       General: Pt is alert, awake, not in acute distress Cardiovascular: normal S1/S2 +, no rubs, no gallops Respiratory: CTA bilaterally, no wheezing, no rhonchi Abdominal: Soft, NT, ND, bowel sounds + Extremities: no edema, no cyanosis   The results of significant diagnostics from this hospitalization (including imaging, microbiology, ancillary and laboratory) are listed below for  reference.     Microbiology: Recent Results (from the past 240 hours)  Resp panel by RT-PCR (RSV, Flu A&B, Covid) Anterior Nasal Swab     Status: None   Collection Time: 06/24/23  2:06 PM   Specimen: Anterior Nasal Swab  Result Value Ref Range Status   SARS Coronavirus 2 by RT PCR NEGATIVE NEGATIVE Final    Comment: (NOTE) SARS-CoV-2 target nucleic acids are NOT DETECTED.  The SARS-CoV-2 RNA is generally detectable in upper respiratory specimens during the acute phase of infection. The lowest concentration of SARS-CoV-2 viral copies this assay can detect is 138 copies/mL. A negative result does not preclude SARS-Cov-2 infection and should not be used as  the sole basis for treatment or other patient management decisions. A negative result may occur with  improper specimen collection/handling, submission of specimen other than nasopharyngeal swab, presence of viral mutation(s) within the areas targeted by this assay, and inadequate number of viral copies(<138 copies/mL). A negative result must be combined with clinical observations, patient history, and epidemiological information. The expected result is Negative.  Fact Sheet for Patients:  BloggerCourse.com  Fact Sheet for Healthcare Providers:  SeriousBroker.it  This test is no t yet approved or cleared by the [place]  FDA and  has been authorized for detection and/or diagnosis of SARS-CoV-2 by FDA under an Emergency Use Authorization (EUA). This EUA will remain  in effect (meaning this test can be used) for the duration of the COVID-19 declaration under Section 564(b)(1) of the Act, 21 U.S.C.section 360bbb-3(b)(1), unless the authorization is terminated  or revoked sooner.       Influenza A by PCR NEGATIVE NEGATIVE Final   Influenza B by PCR NEGATIVE NEGATIVE Final    Comment: (NOTE) The Xpert Xpress SARS-CoV-2/FLU/RSV plus assay is intended as an aid in the  diagnosis of influenza from Nasopharyngeal swab specimens and should not be used as a sole basis for treatment. Nasal washings and aspirates are unacceptable for Xpert Xpress SARS-CoV-2/FLU/RSV testing.  Fact Sheet for Patients: BloggerCourse.com  Fact Sheet for Healthcare Providers: SeriousBroker.it  This test is not yet approved or cleared by the [place]  FDA and has been authorized for detection and/or diagnosis of SARS-CoV-2 by FDA under an Emergency Use Authorization (EUA). This EUA will remain in effect (meaning this test can be used) for the duration of the COVID-19 declaration under Section 564(b)(1) of the Act, 21 U.S.C. section 360bbb-3(b)(1), unless the authorization is terminated or revoked.     Resp Syncytial Virus by PCR NEGATIVE NEGATIVE Final    Comment: (NOTE) Fact Sheet for Patients: BloggerCourse.com  Fact Sheet for Healthcare Providers: SeriousBroker.it  This test is not yet approved or cleared by the [place]  FDA and has been authorized for detection and/or diagnosis of SARS-CoV-2 by FDA under an Emergency Use Authorization (EUA). This EUA will remain in effect (meaning this test can be used) for the duration of the COVID-19 declaration under Section 564(b)(1) of the Act, 21 U.S.C. section 360bbb-3(b)(1), unless the authorization is terminated or revoked.  Performed at [facility], [address], [place], [place] [address]   Gastrointestinal Panel by PCR , Stool     Status: Abnormal   Collection Time: 06/25/23  5:50 PM   Specimen: Stool  Result Value Ref Range Status   Campylobacter species DETECTED (A) NOT DETECTED Final    Comment: RESULT CALLED TO, READ BACK BY AND VERIFIED WITHSherlyn Ditto RN [phone] 06/26/23 HNM    Plesimonas shigelloides NOT DETECTED NOT DETECTED Final   Salmonella species NOT DETECTED NOT DETECTED Final   Yersinia  enterocolitica NOT DETECTED NOT DETECTED Final   Vibrio species NOT DETECTED NOT DETECTED Final   Vibrio cholerae NOT DETECTED NOT DETECTED Final   Enteroaggregative E coli (EAEC) NOT DETECTED NOT DETECTED Final   Enteropathogenic E coli (EPEC) NOT DETECTED NOT DETECTED Final   Enterotoxigenic E coli (ETEC) NOT DETECTED NOT DETECTED Final   Shiga like toxin producing E coli (STEC) NOT DETECTED NOT DETECTED Final   Shigella/Enteroinvasive E coli (EIEC) NOT DETECTED NOT DETECTED Final   Cryptosporidium NOT DETECTED NOT DETECTED Final   Cyclospora cayetanensis NOT DETECTED NOT DETECTED Final   Entamoeba histolytica NOT DETECTED NOT  DETECTED Final   Giardia lamblia NOT DETECTED NOT DETECTED Final   Adenovirus F40/41 NOT DETECTED NOT DETECTED Final   Astrovirus NOT DETECTED NOT DETECTED Final   Norovirus GI/GII NOT DETECTED NOT DETECTED Final   Rotavirus A NOT DETECTED NOT DETECTED Final   Sapovirus (I, II, IV, and V) NOT DETECTED NOT DETECTED Final    Comment: Performed at [facility], [address] [place], [place] [address]  C Difficile Quick Screen w PCR reflex     Status: None   Collection Time: 06/25/23  5:50 PM   Specimen: Stool  Result Value Ref Range Status   C Diff antigen NEGATIVE NEGATIVE Final   C Diff toxin NEGATIVE NEGATIVE Final   C Diff interpretation No C. difficile detected.  Final    Comment: Performed at [facility], [address], [place], [place] [address]     Labs: BNP (last 3 results) No results for input(s): "BNP" in the last 8760 hours. Basic Metabolic Panel: Recent Labs  Lab 06/24/23 1041 06/24/23 1812 06/25/23 0454 06/26/23 0516  NA 122* 123* 130* 137  K 3.4*  --  3.9 3.8  CL 91*  --  103 106  CO2 22  --  20* 23  GLUCOSE 155*  --  124* 120*  BUN 21  --  15 8  CREATININE 0.76 0.69 0.57 0.56  CALCIUM  8.6*  --  8.3* 8.7*  MG  --   --  2.2 2.1   Liver Function Tests: Recent Labs  Lab 06/24/23 1041  AST 23  ALT 16  ALKPHOS 60   BILITOT 0.7  PROT 7.4  ALBUMIN 3.7   No results for input(s): "LIPASE", "AMYLASE" in the last 168 hours. No results for input(s): "AMMONIA" in the last 168 hours. CBC: Recent Labs  Lab 06/24/23 1041 06/24/23 1812 06/25/23 0454  WBC 7.8 7.8 6.0  NEUTROABS 5.9  --   --   HGB 11.8* 10.7* 10.2*  HCT 35.7* 31.4* 30.2*  MCV 84.6 82.6 83.9  PLT 161 158 157   Cardiac Enzymes: Recent Labs  Lab 06/25/23 0454  CKTOTAL 147   BNP: Invalid input(s): "POCBNP" CBG: Recent Labs  Lab 06/24/23 1725 06/25/23 1125 06/25/23 1632 06/25/23 2201 06/26/23 0715  GLUCAP 142* 203* 138* 145* 129*   D-Dimer No results for input(s): "DDIMER" in the last 72 hours. Hgb A1c Recent Labs    06/25/23 0454  HGBA1C 7.8*   Lipid Profile No results for input(s): "CHOL", "HDL", "LDLCALC", "TRIG", "CHOLHDL", "LDLDIRECT" in the last 72 hours. Thyroid  function studies Recent Labs    06/25/23 0454  TSH 1.453   Anemia work up Recent Labs    06/25/23 0454  VITAMINB12 228  FOLATE 14.4   Urinalysis    Component Value Date/Time   COLORURINE YELLOW 06/24/2023 1329   APPEARANCEUR HAZY (A) 06/24/2023 1329   LABSPEC 1.006 06/24/2023 1329   PHURINE 5.0 06/24/2023 1329   GLUCOSEU >=500 (A) 06/24/2023 1329   HGBUR NEGATIVE 06/24/2023 1329   BILIRUBINUR NEGATIVE 06/24/2023 1329   KETONESUR NEGATIVE 06/24/2023 1329   PROTEINUR NEGATIVE 06/24/2023 1329   UROBILINOGEN 4.0 (H) 10/01/2013 2025   NITRITE NEGATIVE 06/24/2023 1329   LEUKOCYTESUR SMALL (A) 06/24/2023 1329   Sepsis Labs Recent Labs  Lab 06/24/23 1041 06/24/23 1812 06/25/23 0454  WBC 7.8 7.8 6.0   Microbiology Recent Results (from the past 240 hours)  Resp panel by RT-PCR (RSV, Flu A&B, Covid) Anterior Nasal Swab     Status: None  Collection Time: 06/24/23  2:06 PM   Specimen: Anterior Nasal Swab  Result Value Ref Range Status   SARS Coronavirus 2 by RT PCR NEGATIVE NEGATIVE Final    Comment: (NOTE) SARS-CoV-2 target nucleic  acids are NOT DETECTED.  The SARS-CoV-2 RNA is generally detectable in upper respiratory specimens during the acute phase of infection. The lowest concentration of SARS-CoV-2 viral copies this assay can detect is 138 copies/mL. A negative result does not preclude SARS-Cov-2 infection and should not be used as the sole basis for treatment or other patient management decisions. A negative result may occur with  improper specimen collection/handling, submission of specimen other than nasopharyngeal swab, presence of viral mutation(s) within the areas targeted by this assay, and inadequate number of viral copies(<138 copies/mL). A negative result must be combined with clinical observations, patient history, and epidemiological information. The expected result is Negative.  Fact Sheet for Patients:  BloggerCourse.com  Fact Sheet for Healthcare Providers:  SeriousBroker.it  This test is no t yet approved or cleared by the [place]  FDA and  has been authorized for detection and/or diagnosis of SARS-CoV-2 by FDA under an Emergency Use Authorization (EUA). This EUA will remain  in effect (meaning this test can be used) for the duration of the COVID-19 declaration under Section 564(b)(1) of the Act, 21 U.S.C.section 360bbb-3(b)(1), unless the authorization is terminated  or revoked sooner.       Influenza A by PCR NEGATIVE NEGATIVE Final   Influenza B by PCR NEGATIVE NEGATIVE Final    Comment: (NOTE) The Xpert Xpress SARS-CoV-2/FLU/RSV plus assay is intended as an aid in the diagnosis of influenza from Nasopharyngeal swab specimens and should not be used as a sole basis for treatment. Nasal washings and aspirates are unacceptable for Xpert Xpress SARS-CoV-2/FLU/RSV testing.  Fact Sheet for Patients: BloggerCourse.com  Fact Sheet for Healthcare Providers: SeriousBroker.it  This  test is not yet approved or cleared by the [place]  FDA and has been authorized for detection and/or diagnosis of SARS-CoV-2 by FDA under an Emergency Use Authorization (EUA). This EUA will remain in effect (meaning this test can be used) for the duration of the COVID-19 declaration under Section 564(b)(1) of the Act, 21 U.S.C. section 360bbb-3(b)(1), unless the authorization is terminated or revoked.     Resp Syncytial Virus by PCR NEGATIVE NEGATIVE Final    Comment: (NOTE) Fact Sheet for Patients: BloggerCourse.com  Fact Sheet for Healthcare Providers: SeriousBroker.it  This test is not yet approved or cleared by the [place]  FDA and has been authorized for detection and/or diagnosis of SARS-CoV-2 by FDA under an Emergency Use Authorization (EUA). This EUA will remain in effect (meaning this test can be used) for the duration of the COVID-19 declaration under Section 564(b)(1) of the Act, 21 U.S.C. section 360bbb-3(b)(1), unless the authorization is terminated or revoked.  Performed at [facility], [address], [place], [place] [address]   Gastrointestinal Panel by PCR , Stool     Status: Abnormal   Collection Time: 06/25/23  5:50 PM   Specimen: Stool  Result Value Ref Range Status   Campylobacter species DETECTED (A) NOT DETECTED Final    Comment: RESULT CALLED TO, READ BACK BY AND VERIFIED WITHSherlyn Ditto RN [phone] 06/26/23 HNM    Plesimonas shigelloides NOT DETECTED NOT DETECTED Final   Salmonella species NOT DETECTED NOT DETECTED Final   Yersinia enterocolitica NOT DETECTED NOT DETECTED Final   Vibrio species NOT DETECTED NOT DETECTED Final   Vibrio cholerae NOT  DETECTED NOT DETECTED Final   Enteroaggregative E coli (EAEC) NOT DETECTED NOT DETECTED Final   Enteropathogenic E coli (EPEC) NOT DETECTED NOT DETECTED Final   Enterotoxigenic E coli (ETEC) NOT DETECTED NOT DETECTED Final   Shiga like toxin  producing E coli (STEC) NOT DETECTED NOT DETECTED Final   Shigella/Enteroinvasive E coli (EIEC) NOT DETECTED NOT DETECTED Final   Cryptosporidium NOT DETECTED NOT DETECTED Final   Cyclospora cayetanensis NOT DETECTED NOT DETECTED Final   Entamoeba histolytica NOT DETECTED NOT DETECTED Final   Giardia lamblia NOT DETECTED NOT DETECTED Final   Adenovirus F40/41 NOT DETECTED NOT DETECTED Final   Astrovirus NOT DETECTED NOT DETECTED Final   Norovirus GI/GII NOT DETECTED NOT DETECTED Final   Rotavirus A NOT DETECTED NOT DETECTED Final   Sapovirus (I, II, IV, and V) NOT DETECTED NOT DETECTED Final    Comment: Performed at [facility], [address] [place], [place] [address]  C Difficile Quick Screen w PCR reflex     Status: None   Collection Time: 06/25/23  5:50 PM   Specimen: Stool  Result Value Ref Range Status   C Diff antigen NEGATIVE NEGATIVE Final   C Diff toxin NEGATIVE NEGATIVE Final   C Diff interpretation No C. difficile detected.  Final    Comment: Performed at [facility], [address] [place], [place] [address]   Time coordinating discharge: 50 mins  SIGNED:  Faustino Hook, MD  [facility] 06/26/2023, 9:50 AM How to contact the [facility] Attending or Consulting provider 7A - 7P or covering provider during after hours 7P -7A, for this patient?  Check the care team in [facility] and look for a) attending/consulting [facility] provider listed and b) the [facility] team listed Log into [email or web address] and use [facility] universal password to access. If you do not have the password, please contact the hospital operator. Locate the [facility] provider you are looking for under [facility] and page to a number that you can be directly reached. If you still have difficulty reaching the provider, please page the [facility] (Director on Call) for the Hospitalists listed on amion for assistance.

## 2023-06-26 NOTE — Discharge Instructions (Addendum)
 What is Campylobacter Gastroenteritis? Campylobacter gastroenteritis is an infection of the intestines caused by bacteria called Campylobacter. It is one of the most common causes of bacterial diarrhea worldwide. The infection is usually acquired by consuming contaminated food or water , particularly undercooked poultry.  Symptoms Symptoms typically appear 2 to 5 days after exposure and can include:  Diarrhea (often bloody)  Fever  Abdominal cramps  Nausea and vomiting Most people recover within a week without needing antibiotics.[3] Treatment  For most healthy individuals, Campylobacter infections are self-limiting and do not require antibiotics. However, in cases of severe or prolonged illness, or in individuals with weakened immune systems, antibiotics may be necessary. The Infectious Diseases Society of America recommends azithromycin  as the first-line treatment due to low resistance rates.[1][4]  Prevention Preventing Campylobacter infection involves:  Cooking poultry thoroughly  Avoiding unpasteurized dairy products  Practicing good hand hygiene, especially after handling raw meat or coming into contact with animals  Using safe water  sources for drinking and food preparation[1][5] When to Seek Medical Attention Seek medical attention if you experience:  Severe or bloody diarrhea  High fever  Signs of dehydration (such as dry mouth, decreased urination, or dizziness)  Symptoms lasting more than a week  Complications Although rare, complications can include reactive arthritis and Guillain-Barr syndrome, a condition that can cause muscle weakness and paralysis.  Conclusion Campylobacter gastroenteritis is a common and usually self-limiting infection. Proper food handling and hygiene practices are key to prevention. In severe cases, medical treatment may be necessary, and azithromycin  is the recommended antibiotic.   IMPORTANT INFORMATION: PAY CLOSE ATTENTION   PHYSICIAN  DISCHARGE INSTRUCTIONS  Follow with Primary care provider  Wendi Ham, NP  and other consultants as instructed by your Hospitalist Physician  SEEK MEDICAL CARE OR RETURN TO EMERGENCY ROOM IF SYMPTOMS COME BACK, WORSEN OR NEW PROBLEM DEVELOPS   Please note: You were cared for by a hospitalist during your hospital stay. Every effort will be made to forward records to your primary care provider.  You can request that your primary care provider send for your hospital records if they have not received them.  Once you are discharged, your primary care physician will handle any further medical issues. Please note that NO REFILLS for any discharge medications will be authorized once you are discharged, as it is imperative that you return to your primary care physician (or establish a relationship with a primary care physician if you do not have one) for your post hospital discharge needs so that they can reassess your need for medications and monitor your lab values.  Please get a complete blood count and chemistry panel checked by your Primary MD at your next visit, and again as instructed by your Primary MD.  Get Medicines reviewed and adjusted: Please take all your medications with you for your next visit with your Primary MD  Laboratory/radiological data: Please request your Primary MD to go over all hospital tests and procedure/radiological results at the follow up, please ask your primary care provider to get all Hospital records sent to his/her office.  In some cases, they will be blood work, cultures and biopsy results pending at the time of your discharge. Please request that your primary care provider follow up on these results.  If you are diabetic, please bring your blood sugar readings with you to your follow up appointment with primary care.    Please call and make your follow up appointments as soon as possible.    Also Note the  following: If you experience worsening of your  admission symptoms, develop shortness of breath, life threatening emergency, suicidal or homicidal thoughts you must seek medical attention immediately by calling 911 or calling your MD immediately  if symptoms less severe.  You must read complete instructions/literature along with all the possible adverse reactions/side effects for all the Medicines you take and that have been prescribed to you. Take any new Medicines after you have completely understood and accpet all the possible adverse reactions/side effects.   Do not drive when taking Pain medications or sleeping medications (Benzodiazepines)  Do not take more than prescribed Pain, Sleep and Anxiety Medications. It is not advisable to combine anxiety,sleep and pain medications without talking with your primary care practitioner  Special Instructions: If you have smoked or chewed Tobacco  in the last 2 yrs please stop smoking, stop any regular Alcohol  and or any Recreational drug use.  Wear Seat belts while driving.  Do not drive if taking any narcotic, mind altering or controlled substances or recreational drugs or alcohol.

## 2023-06-27 ENCOUNTER — Telehealth: Payer: Self-pay

## 2023-06-27 NOTE — Transitions of Care (Post Inpatient/ED Visit) (Signed)
06/27/2023  Name: Megan Schroeder MRN: [id number] DOB: 04-25-1946  Today's TOC FU Call Status: Today's TOC FU Call Status:: Successful TOC FU Call Completed TOC FU Call Complete Date: 06/27/23 Patient's Name and Date of Birth confirmed.  Transition Care Management Follow-up Telephone Call Date of Discharge: 06/26/23 [facility] Type of Discharge: Inpatient Admission Primary Inpatient Discharge Diagnosis:: Campylobacter gastroenteritis How have you been since you were released from the hospital?: Better Any questions or concerns?: No  Items Reviewed: Did you receive and understand the discharge instructions provided?: Yes Medications obtained,verified, and reconciled?: Yes (Medications Reviewed) Any new allergies since your discharge?: No Dietary orders reviewed?: Yes Type of Diet Ordered:: Reg resume regular diet, Heaet Healthy Do you have support at home?: Yes People in Home: spouse Name of Support/Comfort Primary Source: Channing Mutters  Medications Reviewed Today: Medications Reviewed Today     Reviewed by Johnnette Barrios, RN (Registered Nurse) on 06/27/23 at 0911  Med List Status: <None>   Medication Order Taking? Sig Documenting Provider Last Dose Status Informant  amLODipine (NORVASC) 10 MG tablet [id number] Yes Take 1 tablet (10 mg total) by mouth daily. For BP Shon Hale, MD Taking Active Self  aspirin EC 81 MG tablet [id number] Yes Take 1 tablet (81 mg total) by mouth daily as needed for mild pain (pain score 1-3). Cleora Fleet, MD Taking Active   atorvastatin (LIPITOR) 40 MG tablet [id number] Yes Take 40 mg by mouth daily. [provider] Taking Active Self  Bromfenac Sodium 0.07 % SOLN [id number] Yes Place 1 drop into the right eye 4 (four) times daily. Rennis Chris, MD Taking Active Self  cholecalciferol (VITAMIN D) 1000 UNITS tablet [id number] Yes Take 1,000 Units by mouth daily.  [provider] Taking Active Self  Coenzyme  Q10 (COQ-10) 200 MG CAPS [id number] Yes Take 200 mg by mouth daily.  [provider] Taking Active Self  dapagliflozin propanediol (FARXIGA) 10 MG TABS tablet [id number] Yes Take 1 tablet (10 mg total) by mouth daily. Shon Hale, MD Taking Active Self  Dulaglutide (TRULICITY) 4.5 MG/0.5ML SOPN [id number] Yes Inject 4.5 mg into the skin every Sunday. [provider] Taking Active Self  GNP ULTICARE PEN NEEDLES 31G X 5 MM MISC [id number] Yes See admin instructions. [provider] Taking Active Self  levothyroxine (SYNTHROID) 75 MCG tablet [id number] Yes Take 1 tablet (75 mcg total) by mouth daily before breakfast. Shon Hale, MD Taking Active Self  prednisoLONE acetate (PRED FORTE) 1 % ophthalmic suspension [id number] Yes Place 1 drop into the right eye in the morning and at bedtime. Cleora Fleet, MD Taking Active   TOUJEO SOLOSTAR 300 UNIT/ML Solostar Pen [id number] Yes Inject 10 Units into the skin at bedtime. Cleora Fleet, MD Taking Active           Medication reconciliation / review completed based on most recent discharge summary and EHR medication list. Confirmed patient is taking all newly prescribed medications as instructed (any discrepancies are noted in review section)   Patient / Caregiver is aware of any changes to and / or  any dosage adjustments to medication regimen. Patient/ Caregiver denies questions at this time and reports no barriers to medication adherence.   Home Care and Equipment/Supplies: Were Home Health Services Ordered?: No Any new equipment or medical supplies ordered?: No  Functional Questionnaire: Do you need assistance with bathing/showering or dressing?: No Do you need assistance with meal preparation?: No Do you need assistance with eating?: No  Do you have difficulty maintaining continence: No Do you need assistance with getting out of bed/getting out of a chair/moving?: No Do you have difficulty managing or  taking your medications?: No  Follow up appointments reviewed: PCP Follow-up appointment confirmed?: Yes Date of PCP follow-up appointment?: 07/04/23 Follow-up Provider: Lupita Raider Specialist [facility] Follow-up appointment confirmed?: Yes Date of Specialist follow-up appointment?: 07/10/23 Follow-Up Specialty Provider:: Opthal Retina Specialist Do you need transportation to your follow-up appointment?: No (she drives) Do you understand care options if your condition(s) worsen?: Yes-patient verbalized understanding  SDOH Interventions Today    Flowsheet Row Most Recent Value  SDOH Interventions   Food Insecurity Interventions Intervention Not Indicated  Housing Interventions Intervention Not Indicated  Transportation Interventions Intervention Not Indicated, Patient Resources (Friends/Family)  Utilities Interventions Intervention Not Indicated  Social Connections Interventions Intervention Not Indicated      Interventions Today    Flowsheet Row Most Recent Value  Chronic Disease   Chronic disease during today's visit Diabetes  General Interventions   General Interventions Discussed/Reviewed General Interventions Reviewed  Exercise Interventions   Exercise Discussed/Reviewed Physical Activity  Nutrition Interventions   Nutrition Discussed/Reviewed Nutrition Reviewed, Decreasing sugar intake, Fluid intake, Supplemental nutrition  Pharmacy Interventions   Pharmacy Dicussed/Reviewed Medications and their functions         Discussed [facility]  TOC program and weekly calls to patient to assess condition/status, medication management  and provide support/education as indicated . Patient/ Caregiver voiced understanding and declined enrollment in the 30-day TOC Program.  She is feeling better still a little weak, increasing fluids had normal  formed BM today. Good support at home , resuming medications as ordered.     The patient has been provided with contact information for the  care management team and has been advised to call with any health related questions or concerns.    Susa Loffler , BSN, RN [facility]   [facility] RN Care Manager Direct Dial [phone] Fax [phone] Website: [email or web address]

## 2023-06-28 ENCOUNTER — Telehealth: Payer: Self-pay

## 2023-06-28 NOTE — Patient Outreach (Signed)
  Care Management  Transitions of Care Program Transitions of Care Post-discharge call x 1    06/28/2023 Name: AMANADA ROOT MRN: [id number] DOB: 09-Sep-1945  Subjective: Megan Schroeder is a 78 y.o. year old female who is a primary care patient of Lupita Raider, NP. The Care Management team Engaged with patient Engaged with patient by telephone to assess and address transitions of care needs.      Assessment:     Patient/ Caregiver  voices no new complaints or concerns  and has not developed/ reported any new medical issues / Dx or acute changes. - since last follow-up call for most recent  Hospital stay   1/13-1/15/ 2025 She stated she is feeling much better, She has had no more loose stool She is advancing her diet without and issues. She feels much stronger . Tolerating increased fluids well.  Confirmed patient is taking all newly prescribed medications as instructed (any discrepancies are noted in review section)    Patient/ Caregiver denies questions at this time and reports no barriers to medication adherence  Patient educated on red flag s/s to watch for and was encouraged to report, any changes in baseline or  medication regimen,  changes in health status  /  well-being, safety concerns  or any new unmanaged side effects or symptoms not relieved with interventions  to PCP        Plan: The patient has been provided with contact information for the care management team and has been advised to call with any health related questions or concerns.   Susa Loffler , BSN, RN [facility]   [facility] RN Care Manager Direct Dial [phone] Fax [phone] Website: [email or web address]

## 2023-07-02 ENCOUNTER — Encounter: Payer: Self-pay | Admitting: Orthopedic Surgery

## 2023-07-02 ENCOUNTER — Other Ambulatory Visit (INDEPENDENT_AMBULATORY_CARE_PROVIDER_SITE_OTHER): Payer: Self-pay

## 2023-07-02 ENCOUNTER — Ambulatory Visit: Payer: Medicare Other | Admitting: Orthopedic Surgery

## 2023-07-02 VITALS — BP 151/77 | HR 108 | Ht 63.0 in | Wt 153.0 lb

## 2023-07-02 DIAGNOSIS — M25512 Pain in left shoulder: Secondary | ICD-10-CM | POA: Diagnosis not present

## 2023-07-02 NOTE — Patient Instructions (Signed)
Pendulum   Stand near a wall or a surface that you can hold onto for balance. Bend at the waist and let your left / right arm hang straight down. Use your other arm to support you. Keep your back straight and do not lock your knees. Relax your left / right arm and shoulder muscles, and move your hips and your trunk so your left / right arm swings freely. Your arm should swing because of the motion of your body, not because you are using your arm or shoulder muscles. Keep moving your hips and trunk so your arm swings in the following directions, as told by your health care provider: Side to side. Forward and backward. In clockwise and counterclockwise circles. Continue each motion for 20 seconds, or for as long as told by your health care provider. Slowly return to the starting position.  Repeat 10 times. Complete this exercise daily.  

## 2023-07-02 NOTE — Progress Notes (Signed)
New Patient Visit  Assessment: Megan Schroeder is a 78 y.o. female with the following: 1. Acute pain of left shoulder  Plan: SKYLEEN WATES has severe pain in her left shoulder, that started 2 days ago.  No specific injury.  She states that she woke up, with pain, and even small movements made her pain worse.  This is not happened to her before.  No prior injuries.  She was active the day before onset, but no specific injury.  No fall.  No stumble no popping sensations.  We did discuss multiple treatment options.  She is a diabetic, and would like to minimize the effect of treatment on her blood sugar.  As such, would recommend against oral prednisone.  We can consider an injection.  She would like to hold off at this time.  We have given her a sling to allow the arm to rest.  Medications as needed.  Continue with ice.  Tylenol or ibuprofen.  Follow-up in 2 weeks for repeat assessment.  If she is still having difficulty at that time, I would recommend an injection.  Follow-up: Return in about 2 weeks (around 07/16/2023).  Subjective:  Chief Complaint  Patient presents with   Shoulder Pain    L shoulder since Sunday. No injuries woke up and couldn't move arm, states she's feeling a little better but still hurts.     History of Present Illness: Megan Schroeder is a 78 y.o. female who presents for evaluation of left shoulder pain.  She states that she woke up 2 days ago, and had severe pain in the left shoulder.  No prior injuries.  She has not injured her left shoulder before.  She is right-hand dominant.  She was active on Saturday, but does not remember a pop or snapping sensation.  Even a little bit of movement is causing her a lot of discomfort.  Ice has been helpful.  Heat made things worse.  She has been taking a baby aspirin, with limited improvement in her symptoms.   Review of Systems: No fevers or chills No numbness or tingling No chest pain No shortness of breath No bowel or  bladder dysfunction No GI distress No headaches   Medical History:  Past Medical History:  Diagnosis Date   Anxiety    CAD (coronary artery disease)    Diabetic retinopathy (HCC)    NPDR OU   Hyperlipidemia    Hypertension    Hypertensive retinopathy    OU   Hypothyroidism    Type 2 diabetes mellitus (HCC)     Past Surgical History:  Procedure Laterality Date   BREAST EXCISIONAL BIOPSY Left    CATARACT EXTRACTION W/PHACO Left 12/16/2017   Procedure: CATARACT EXTRACTION PHACO AND INTRAOCULAR LENS PLACEMENT (IOC);  Surgeon: Gemma Payor, MD;  Location: [facility] ORS;  Service: Ophthalmology;  Laterality: Left;  CDE: 11.65   CATARACT EXTRACTION W/PHACO Right 12/30/2017   Procedure: CATARACT EXTRACTION PHACO AND INTRAOCULAR LENS PLACEMENT RIGHT EYE;  Surgeon: Gemma Payor, MD;  Location: [facility]S;  Service: Ophthalmology;  Laterality: Right;  CDE: 9.07   COLONOSCOPY N/A 09/15/2015   Dr. Geni Bers multiple rectal and colonic polyps removed. Hepatic flexure with 9 mm polyp, multiple 5-7 mm polyps. Multiple cecal polyps with largest 1.5 cm, one 5 mm polyp in rectum. Path for colonic polyps with sessile serrated polyp without dysplasia, no high grade dysplasia, tubular adenomas, rectal hyperplastic polyp   COLONOSCOPY WITH PROPOFOL N/A 07/20/2019   multiple tubular adenomas. diverticulosis.  3 year surveillance   COLONOSCOPY WITH PROPOFOL N/A 02/20/2023   Procedure: COLONOSCOPY WITH PROPOFOL;  Surgeon: Corbin Ade, MD;  Location: [facility] ENDO SUITE;  Service: Endoscopy;  Laterality: N/A;  730am, asa 3   EYE SURGERY Bilateral 2019   Cat Sx - Dr. Gemma Payor   LEFT HEART CATHETERIZATION WITH CORONARY ANGIOGRAM N/A 07/23/2011   Procedure: LEFT HEART CATHETERIZATION WITH CORONARY ANGIOGRAM;  Surgeon: Wendall Stade, MD;  Location: [facility] CATH LAB;  Service: Cardiovascular;  Laterality: N/A;   POLYPECTOMY  07/20/2019   Procedure: POLYPECTOMY;  Surgeon: Corbin Ade, MD;  Location: [facility];  Service:  Endoscopy;;   POLYPECTOMY  02/20/2023   Procedure: POLYPECTOMY INTESTINAL;  Surgeon: Corbin Ade, MD;  Location: [facility];  Service: Endoscopy;;   YAG LASER APPLICATION Right 09/01/2020   Dr. Rennis Chris   YAG LASER APPLICATION Left 09/01/2020   Dr. Rennis Chris    Family History  Problem Relation Age of Onset   Cancer Father    Diabetes Father    Colon cancer Sister    Social History   Tobacco Use   Smoking status: Never   Smokeless tobacco: Never  Vaping Use   Vaping status: Never Used  Substance Use Topics   Alcohol use: No   Drug use: No    Allergies  Allergen Reactions   Penicillins Hives        Hydrocodone Nausea And Vomiting   Sulfa Antibiotics Rash    Current Meds  Medication Sig   amLODipine (NORVASC) 10 MG tablet Take 1 tablet (10 mg total) by mouth daily. For BP   aspirin EC 81 MG tablet Take 1 tablet (81 mg total) by mouth daily as needed for mild pain (pain score 1-3).   atorvastatin (LIPITOR) 40 MG tablet Take 40 mg by mouth daily.   Bromfenac Sodium 0.07 % SOLN Place 1 drop into the right eye 4 (four) times daily.   cholecalciferol (VITAMIN D) 1000 UNITS tablet Take 1,000 Units by mouth daily.    Coenzyme Q10 (COQ-10) 200 MG CAPS Take 200 mg by mouth daily.    dapagliflozin propanediol (FARXIGA) 10 MG TABS tablet Take 1 tablet (10 mg total) by mouth daily.   Dulaglutide (TRULICITY) 4.5 MG/0.5ML SOPN Inject 4.5 mg into the skin every Sunday.   GNP ULTICARE PEN NEEDLES 31G X 5 MM MISC See admin instructions.   levothyroxine (SYNTHROID) 75 MCG tablet Take 1 tablet (75 mcg total) by mouth daily before breakfast.   prednisoLONE acetate (PRED FORTE) 1 % ophthalmic suspension Place 1 drop into the right eye in the morning and at bedtime.   TOUJEO SOLOSTAR 300 UNIT/ML Solostar Pen Inject 10 Units into the skin at bedtime.    Objective: BP (!) 151/77   Pulse (!) 108   Ht 5\' 3"  (1.6 m)   Wt 153 lb (69.4 kg)   BMI 27.10 kg/m   Physical  Exam:  General: Elderly female., Alert and oriented., and No acute distress. Gait: Normal gait.  Left shoulder without redness.  Mild swelling is appreciated.  Diffuse tenderness to palpation.  Even slight motion of the left shoulder makes her pain worse.  Fingers are warm well-perfused.  Sensation intact throughout the left hand.   IMAGING: I personally ordered and reviewed the following images  X-rays of the left shoulder were obtained in clinic today.  No acute injuries are noted.  Glenohumeral joint is reduced.  At the footprint of the rotator cuff tendons,  there is chronic appearing calcification.  This is tracking proximally underneath the acromion.  No proximal humeral migration.  No bony lesions.  Impression: Left shoulder x-rays without acute injury, calcium deposition at the footprint of the rotator cuff   New Medications:  No orders of the defined types were placed in this encounter.     Oliver Barre, MD  07/02/2023 11:06 AM

## 2023-07-03 NOTE — Progress Notes (Signed)
. [facility] - Clinic Note  07/10/2023     CHIEF COMPLAINT Patient presents for Retina Follow Up  HISTORY OF PRESENT ILLNESS: Megan Schroeder is a 78 y.o. female who presents to the clinic today for:   HPI     Retina Follow Up   Patient presents with  Other.  In both eyes.  Severity is moderate.  Duration of 6 weeks.  Since onset it is stable.  I, the attending physician,  performed the HPI with the patient and updated documentation appropriately.        Comments   Patient feels the vision is the same. She is using Pred OD QID and Prolensa OD TID. Her blood sugar was 120.      Last edited by Rennis Chris, MD on 07/10/2023  8:43 AM.    Pt feels like the vision is her right eye vision is not as good today, she was in the hospital with norovirus since she was here last   Referring physician: Lupita Raider, NP [address] Dr Laurey Morale [place],  [place] [address]  HISTORICAL INFORMATION:   Selected notes from the MEDICAL RECORD NUMBER Referred by Dr. Laural Benes   CURRENT MEDICATIONS: Current Outpatient Medications (Ophthalmic Drugs)  Medication Sig   Bromfenac Sodium 0.07 % SOLN Place 1 drop into the right eye 4 (four) times daily.   prednisoLONE acetate (PRED FORTE) 1 % ophthalmic suspension INSTILL 1 DROP IN THE RIGHT EYE FOUR TIMES DAILY   No current facility-administered medications for this visit. (Ophthalmic Drugs)   Current Outpatient Medications (Other)  Medication Sig   amLODipine (NORVASC) 10 MG tablet Take 1 tablet (10 mg total) by mouth daily. For BP   aspirin EC 81 MG tablet Take 1 tablet (81 mg total) by mouth daily as needed for mild pain (pain score 1-3).   atorvastatin (LIPITOR) 40 MG tablet Take 40 mg by mouth daily.   cholecalciferol (VITAMIN D) 1000 UNITS tablet Take 1,000 Units by mouth daily.    Coenzyme Q10 (COQ-10) 200 MG CAPS Take 200 mg by mouth daily.    dapagliflozin propanediol (FARXIGA) 10 MG TABS tablet Take 1 tablet (10 mg  total) by mouth daily.   Dulaglutide (TRULICITY) 4.5 MG/0.5ML SOPN Inject 4.5 mg into the skin every Sunday.   GNP ULTICARE PEN NEEDLES 31G X 5 MM MISC See admin instructions.   levothyroxine (SYNTHROID) 75 MCG tablet Take 1 tablet (75 mcg total) by mouth daily before breakfast.   TOUJEO SOLOSTAR 300 UNIT/ML Solostar Pen Inject 10 Units into the skin at bedtime.   No current facility-administered medications for this visit. (Other)   REVIEW OF SYSTEMS: ROS   Positive for: Gastrointestinal, Endocrine, Cardiovascular, Eyes Negative for: Constitutional, Neurological, Skin, Genitourinary, Musculoskeletal, HENT, Respiratory, Psychiatric, Allergic/Imm, Heme/Lymph Last edited by Charlette Caffey, COT on 07/10/2023  7:56 AM.        ALLERGIES Allergies  Allergen Reactions   Penicillins Hives        Hydrocodone Nausea And Vomiting   Sulfa Antibiotics Rash   PAST MEDICAL HISTORY Past Medical History:  Diagnosis Date   Anxiety    CAD (coronary artery disease)    Diabetic retinopathy (HCC)    NPDR OU   Hyperlipidemia    Hypertension    Hypertensive retinopathy    OU   Hypothyroidism    Type 2 diabetes mellitus (HCC)    Past Surgical History:  Procedure Laterality Date   BREAST EXCISIONAL BIOPSY Left  CATARACT EXTRACTION W/PHACO Left 12/16/2017   Procedure: CATARACT EXTRACTION PHACO AND INTRAOCULAR LENS PLACEMENT (IOC);  Surgeon: Gemma Payor, MD;  Location: [facility];  Service: Ophthalmology;  Laterality: Left;  CDE: 11.65   CATARACT EXTRACTION W/PHACO Right 12/30/2017   Procedure: CATARACT EXTRACTION PHACO AND INTRAOCULAR LENS PLACEMENT RIGHT EYE;  Surgeon: Gemma Payor, MD;  Location: [facility];  Service: Ophthalmology;  Laterality: Right;  CDE: 9.07   COLONOSCOPY N/A 09/15/2015   Dr. Geni Bers multiple rectal and colonic polyps removed. Hepatic flexure with 9 mm polyp, multiple 5-7 mm polyps. Multiple cecal polyps with largest 1.5 cm, one 5 mm polyp in rectum. Path for colonic polyps  with sessile serrated polyp without dysplasia, no high grade dysplasia, tubular adenomas, rectal hyperplastic polyp   COLONOSCOPY WITH PROPOFOL N/A 07/20/2019   multiple tubular adenomas. diverticulosis. 3 year surveillance   COLONOSCOPY WITH PROPOFOL N/A 02/20/2023   Procedure: COLONOSCOPY WITH PROPOFOL;  Surgeon: Corbin Ade, MD;  Location: [facility];  Service: Endoscopy;  Laterality: N/A;  730am, asa 3   EYE SURGERY Bilateral 2019   Cat Sx - Dr. Gemma Payor   LEFT HEART CATHETERIZATION WITH CORONARY ANGIOGRAM N/A 07/23/2011   Procedure: LEFT HEART CATHETERIZATION WITH CORONARY ANGIOGRAM;  Surgeon: Wendall Stade, MD;  Location: [facility] CATH LAB;  Service: Cardiovascular;  Laterality: N/A;   POLYPECTOMY  07/20/2019   Procedure: POLYPECTOMY;  Surgeon: Corbin Ade, MD;  Location: [facility];  Service: Endoscopy;;   POLYPECTOMY  02/20/2023   Procedure: POLYPECTOMY INTESTINAL;  Surgeon: Corbin Ade, MD;  Location: [facility];  Service: Endoscopy;;   YAG LASER APPLICATION Right 09/01/2020   Dr. Rennis Chris   YAG LASER APPLICATION Left 09/01/2020   Dr. Rennis Chris   FAMILY HISTORY Family History  Problem Relation Age of Onset   Cancer Father    Diabetes Father    Colon cancer Sister    SOCIAL HISTORY Social History   Tobacco Use   Smoking status: Never   Smokeless tobacco: Never  Vaping Use   Vaping status: Never Used  Substance Use Topics   Alcohol use: No   Drug use: No       OPHTHALMIC EXAM:  Base Eye Exam     Visual Acuity (Snellen - Linear)       Right Left   Dist cc 20/30 20/20   Dist ph cc NI     Correction: Glasses         Tonometry (Tonopen, 8:00 AM)       Right Left   Pressure 14 13         Pupils       Dark Light Shape React APD   Right 3 2 Round Brisk None   Left 3 2 Round Brisk None         Visual Fields       Left Right    Full Full         Extraocular Movement       Right Left    Full, Ortho Full, Ortho          Neuro/Psych     Oriented x3: Yes   Mood/Affect: Normal         Dilation     Both eyes: 1.0% Mydriacyl, 2.5% Phenylephrine @ 7:57 AM           Slit Lamp and Fundus Exam     Slit Lamp Exam       Right Left  Lids/Lashes Dermatochalasis - upper lid, mild MGD Dermatochalasis - upper lid, mild MGD   Conjunctiva/Sclera white and quiet white and quiet   Cornea 1-2+PEE, well healed temporal cataract wound 1+PEE, well healed temporal cataract wound   Anterior Chamber deep, clear, narrow temporal angle deep and clear   Iris round and dilated, No NVI round and dilated, No NVI   Lens PC IOL in good position, open PC PC IOL in good position with open PC   Anterior Vitreous syneresis, PVD syneresis, Posterior vitreous detachment, vitreous condensations         Fundus Exam       Right Left   Disc pink and sharp, compact, mild PPA pink and sharp, compact, mild PPP   C/D Ratio 0.3 0.1   Macula flat, blunted foveal reflex, ERM with striae inferiorly, punctate IRH - improved, +central cyst / edema, no exudates flat, blunted foveal reflex, mild ERM, rare MA, no edema, focal pigment clumping temporal to fovea   Vessels attenuated, tortuous greatest IT arcades attenuated, tortuous   Periphery attached, rare MA / DBH attached, rare MA/DBH, no RT/RD 360           Refraction     Wearing Rx       Sphere Cylinder Axis Add   Right [place]   +2.50   Left -0.25 +0.50 177 +2.50           IMAGING AND PROCEDURES  Imaging and Procedures for 07/10/2023  OCT, Retina - OU - Both Eyes       Right Eye Quality was good. Central Foveal Thickness: 495. Progression has been stable. Findings include no SRF, abnormal foveal contour, epiretinal membrane, intraretinal fluid, macular pucker (ERM with pucker -- stable; mild persistent IRF and central cyst ).   Left Eye Quality was good. Central Foveal Thickness: 255. Progression has been stable. Findings include normal foveal contour, no  IRF, no SRF, epiretinal membrane, macular pucker (ERM with early pucker, partial PVD).   Notes *Images captured and stored on drive  Diagnosis / Impression:  OD: ERM with pucker -- stable; mild persistent IRF and central cyst  OS: ERM with early pucker, partial PVD  Clinical management:  See below  Abbreviations: NFP - Normal foveal profile. CME - cystoid macular edema. PED - pigment epithelial detachment. IRF - intraretinal fluid. SRF - subretinal fluid. EZ - ellipsoid zone. ERM - epiretinal membrane. ORA - outer retinal atrophy. ORT - outer retinal tubulation. SRHM - subretinal hyper-reflective material. IRHM - intraretinal hyper-reflective material      Intravitreal Injection, Pharmacologic Agent - OD - Right Eye       Time Out 07/10/2023. 8:02 AM. Confirmed correct patient, procedure, site, and patient consented.   Anesthesia Topical anesthesia was used. Anesthetic medications included Lidocaine 2%, Proparacaine 0.5%.   Procedure Preparation included 5% betadine to ocular surface, eyelid speculum. A (32g) needle was used.   Injection: 2 mg aflibercept 2 MG/0.05ML   Route: Intravitreal, Site: Right Eye   NDC: L6038910, Lot: [id number], Expiration date: 07/11/2024, Waste: 0 mL   Post-op Post injection exam found visual acuity of at least counting fingers. The patient tolerated the procedure well. There were no complications. The patient received written and verbal post procedure care education. Post injection medications were not given.             ASSESSMENT/PLAN:    ICD-10-CM   1. Epiretinal membrane (ERM) of both eyes  H35.373     2. Moderate nonproliferative diabetic retinopathy  of both eyes with macular edema associated with type 2 diabetes mellitus (HCC)  E11.3313 OCT, Retina - OU - Both Eyes    Intravitreal Injection, Pharmacologic Agent - OD - Right Eye    aflibercept (EYLEA) SOLN 2 mg    3. Current use of insulin (HCC)  Z79.4     4. Long term  (current) use of oral hypoglycemic drugs  Z79.84     5. Long-term (current) use of injectable non-insulin antidiabetic drugs  Z79.85     6. Essential hypertension  I10     7. Hypertensive retinopathy of both eyes  H35.033     8. Pseudophakia of both eyes  Z96.1      1. Epiretinal membrane OU -- stable  - BCVA OD 20/30; OS 20/20 - OCT shows OD: ERM with pucker and prominent central cyst; persistent IRF and central cyst; OS: mild ERM with early pucker, partial PVD    - IRF OD: ?DME vs CME vs cystic changes - started PF and Prolensa QID OD only for possible CME component on 12.8.22 -- continue, but suspect DME may be significant component  - no metamorphopsia  - monitor for now  - f/u 6 weeks -- DFE/OCT  2-5. Moderate nonproliferative diabetic retinopathy OU - s/p IVA OD #1 (02.16.23), #2 (03.20.23), #3 (04.17.23), #4 (05.22.23), #5 (06.22.23), #6 (07.27.23),  #7 (09.01.23) #8 (10.06.23), #9 (11.09.23), #10 (12.15.23) #11 (01.12.24), #12 (02.09.24) #13 (03.15.24) - s/p IVE OD #1 (04.18.24), #2 (05.24.24), #3 (07.01.24), #4 (08.12.24), #5 (09.25.24), #6 (11.06.24), #7 (12.18.24) - BCVA OD: stable at 20/30, OS: 20/20  - exam shows scattered MA, no NV - OCT shows OD: ERM with pucker stable; mild persistent IRF and central cyst; OS: ERM with early pucker, partial PVD at 6 weeks - recommend IVE OD #8 today, 01.29.25 for DME component w/ f/u in 6 wks - pt wishes to proceed - RBA of procedure discussed, questions answered - IVE informed consent obtained and signed, 04.19.24 (OD)  - have obtained Eylea approval for 2025 - [employer] funding unavailable -- pt covering 20% coinsurance - see procedure note  - f/u in 6 wks - DFE/OCT, possible injxn  6,7. Hypertensive retinopathy OU - discussed importance of tight BP control - monitor   8. Pseudophakia OU  - s/p CE/IOL OU, 2019 (Dr. Alto [place])  - IOLs in good position, doing well  - s/p YAG cap OD 3.24.22  - s/p YAG cap OS 04.07.22  -  monitor  Ophthalmic Meds Ordered this visit:  Meds ordered this encounter  Medications   aflibercept (EYLEA) SOLN 2 mg     Return in about 6 weeks (around 08/21/2023) for f/u NPDR OU, DFE, OCT.  There are no Patient Instructions on file for this visit.  This document serves as a record of services personally performed by Karie Chimera, MD, PhD. It was created on their behalf by Glee Arvin. Manson Passey, OA an ophthalmic technician. The creation of this record is the provider's dictation and/or activities during the visit.    Electronically signed by: Glee Arvin. Manson Passey, OA 07/10/23 12:50 PM   Karie Chimera, M.D., Ph.D. Diseases & Surgery of the Retina and Vitreous [facility] 07/10/2023   I have reviewed the above documentation for accuracy and completeness, and I agree with the above. Karie Chimera, M.D., Ph.D. 07/10/23 12:51 PM   Abbreviations: M myopia (nearsighted); A astigmatism; H hyperopia (farsighted); P presbyopia; Mrx spectacle prescription;  CTL contact lenses;  OD right eye; OS left eye; OU both eyes  XT exotropia; ET esotropia; PEK punctate epithelial keratitis; PEE punctate epithelial erosions; DES dry eye syndrome; MGD meibomian gland dysfunction; ATs artificial tears; PFAT's preservative free artificial tears; NSC nuclear sclerotic cataract; PSC posterior subcapsular cataract; ERM epi-retinal membrane; PVD posterior vitreous detachment; RD retinal detachment; DM diabetes mellitus; DR diabetic retinopathy; NPDR non-proliferative diabetic retinopathy; PDR proliferative diabetic retinopathy; CSME clinically significant macular edema; DME diabetic macular edema; dbh dot blot hemorrhages; CWS cotton wool spot; POAG primary open angle glaucoma; C/D cup-to-disc ratio; HVF humphrey visual field; GVF goldmann visual field; OCT optical coherence tomography; IOP intraocular pressure; BRVO Branch retinal vein occlusion; CRVO central retinal vein occlusion; CRAO central  retinal artery occlusion; BRAO branch retinal artery occlusion; RT retinal tear; SB scleral buckle; PPV pars plana vitrectomy; VH Vitreous hemorrhage; PRP panretinal laser photocoagulation; IVK intravitreal kenalog; VMT vitreomacular traction; MH Macular hole;  NVD neovascularization of the disc; NVE neovascularization elsewhere; AREDS age related eye disease study; ARMD age related macular degeneration; POAG primary open angle glaucoma; EBMD epithelial/anterior basement membrane dystrophy; ACIOL anterior chamber intraocular lens; IOL intraocular lens; PCIOL posterior chamber intraocular lens; Phaco/IOL phacoemulsification with intraocular lens placement; PRK photorefractive keratectomy; LASIK laser assisted in situ keratomileusis; HTN hypertension; DM diabetes mellitus; COPD chronic obstructive pulmonary disease

## 2023-07-04 DIAGNOSIS — E871 Hypo-osmolality and hyponatremia: Secondary | ICD-10-CM | POA: Diagnosis not present

## 2023-07-04 DIAGNOSIS — I10 Essential (primary) hypertension: Secondary | ICD-10-CM | POA: Diagnosis not present

## 2023-07-04 DIAGNOSIS — Z79899 Other long term (current) drug therapy: Secondary | ICD-10-CM | POA: Diagnosis not present

## 2023-07-04 DIAGNOSIS — A084 Viral intestinal infection, unspecified: Secondary | ICD-10-CM | POA: Diagnosis not present

## 2023-07-10 ENCOUNTER — Ambulatory Visit (INDEPENDENT_AMBULATORY_CARE_PROVIDER_SITE_OTHER): Payer: Medicare Other | Admitting: Ophthalmology

## 2023-07-10 ENCOUNTER — Encounter (INDEPENDENT_AMBULATORY_CARE_PROVIDER_SITE_OTHER): Payer: Self-pay | Admitting: Ophthalmology

## 2023-07-10 DIAGNOSIS — Z961 Presence of intraocular lens: Secondary | ICD-10-CM

## 2023-07-10 DIAGNOSIS — I10 Essential (primary) hypertension: Secondary | ICD-10-CM | POA: Diagnosis not present

## 2023-07-10 DIAGNOSIS — H35373 Puckering of macula, bilateral: Secondary | ICD-10-CM

## 2023-07-10 DIAGNOSIS — Z7985 Long-term (current) use of injectable non-insulin antidiabetic drugs: Secondary | ICD-10-CM | POA: Diagnosis not present

## 2023-07-10 DIAGNOSIS — E113313 Type 2 diabetes mellitus with moderate nonproliferative diabetic retinopathy with macular edema, bilateral: Secondary | ICD-10-CM | POA: Diagnosis not present

## 2023-07-10 DIAGNOSIS — Z7984 Long term (current) use of oral hypoglycemic drugs: Secondary | ICD-10-CM

## 2023-07-10 DIAGNOSIS — H35033 Hypertensive retinopathy, bilateral: Secondary | ICD-10-CM

## 2023-07-10 DIAGNOSIS — Z794 Long term (current) use of insulin: Secondary | ICD-10-CM

## 2023-07-10 MED ORDER — AFLIBERCEPT 2MG/0.05ML IZ SOLN FOR KALEIDOSCOPE
2.0000 mg | INTRAVITREAL | Status: AC | PRN
  Administered 2023-07-10: 2 mg via INTRAVITREAL

## 2023-07-15 LAB — MISCELLANEOUS TEST

## 2023-07-16 ENCOUNTER — Ambulatory Visit: Payer: Medicare Other | Admitting: Orthopedic Surgery

## 2023-07-19 ENCOUNTER — Other Ambulatory Visit (INDEPENDENT_AMBULATORY_CARE_PROVIDER_SITE_OTHER): Payer: Self-pay

## 2023-07-19 MED ORDER — BROMFENAC SODIUM 0.07 % OP SOLN: 1.0000 [drp] | Freq: Four times a day (QID) | OPHTHALMIC | 6 refills | Status: AC

## 2023-08-13 NOTE — Progress Notes (Signed)
 . [facility] - Clinic Note  08/21/2023     CHIEF COMPLAINT Patient presents for Retina Follow Up  HISTORY OF PRESENT ILLNESS: Megan Schroeder is a 78 y.o. female who presents to the clinic today for:   HPI     Retina Follow Up           Diagnosis: Diabetic Retinopathy   Laterality: both eyes   Onset: 6 weeks ago   Duration: 6 weeks   Course: stable         Comments   6 week retina follow up and IVE OD pt is reporting no vision changes noticed she denies any flashes has some floaters her last reading 90 this am A1C 7 yesterday       Last edited by Posey Boyer, COT on 08/21/2023  8:43 AM.     Pt states vision is the same, she's using PF and Prolensa TID OU   Referring physician: Lupita Raider, NP [address] [place],  [place] [address]  HISTORICAL INFORMATION:   Selected notes from the MEDICAL RECORD NUMBER Referred by Dr. Laural Benes   CURRENT MEDICATIONS: Current Outpatient Medications (Ophthalmic Drugs)  Medication Sig   Bromfenac Sodium 0.07 % SOLN Place 1 drop into the right eye 4 (four) times daily.   Bromfenac Sodium 0.07 % SOLN Place 1 drop into the right eye 4 (four) times daily.   prednisoLONE acetate (PRED FORTE) 1 % ophthalmic suspension INSTILL 1 DROP IN THE RIGHT EYE FOUR TIMES DAILY   No current facility-administered medications for this visit. (Ophthalmic Drugs)   Current Outpatient Medications (Other)  Medication Sig   amLODipine (NORVASC) 10 MG tablet Take 1 tablet (10 mg total) by mouth daily. For BP   aspirin EC 81 MG tablet Take 1 tablet (81 mg total) by mouth daily as needed for mild pain (pain score 1-3).   atorvastatin (LIPITOR) 40 MG tablet Take 40 mg by mouth daily.   cholecalciferol (VITAMIN D) 1000 UNITS tablet Take 1,000 Units by mouth daily.    Coenzyme Q10 (COQ-10) 200 MG CAPS Take 200 mg by mouth daily.    dapagliflozin propanediol (FARXIGA) 10 MG TABS tablet Take 1 tablet (10 mg total) by mouth daily.    Dulaglutide (TRULICITY) 4.5 MG/0.5ML SOPN Inject 4.5 mg into the skin every Sunday.   GNP ULTICARE PEN NEEDLES 31G X 5 MM MISC See admin instructions.   levothyroxine (SYNTHROID) 75 MCG tablet Take 1 tablet (75 mcg total) by mouth daily before breakfast.   TOUJEO SOLOSTAR 300 UNIT/ML Solostar Pen Inject 10 Units into the skin at bedtime.   No current facility-administered medications for this visit. (Other)   REVIEW OF SYSTEMS: ROS   Positive for: Gastrointestinal, Endocrine, Cardiovascular, Eyes Negative for: Constitutional, Neurological, Skin, Genitourinary, Musculoskeletal, HENT, Respiratory, Psychiatric, Allergic/Imm, Heme/Lymph Last edited by Etheleen Mayhew, COT on 08/21/2023  8:03 AM.         ALLERGIES Allergies  Allergen Reactions   Penicillins Hives        Hydrocodone Nausea And Vomiting   Sulfa Antibiotics Rash   PAST MEDICAL HISTORY Past Medical History:  Diagnosis Date   Anxiety    CAD (coronary artery disease)    Diabetic retinopathy (HCC)    NPDR OU   Hyperlipidemia    Hypertension    Hypertensive retinopathy    OU   Hypothyroidism    Type 2 diabetes mellitus (HCC)    Past Surgical History:  Procedure Laterality  Date   BREAST EXCISIONAL BIOPSY Left    CATARACT EXTRACTION W/PHACO Left 12/16/2017   Procedure: CATARACT EXTRACTION PHACO AND INTRAOCULAR LENS PLACEMENT (IOC);  Surgeon: Gemma Payor, MD;  Location: [facility];  Service: Ophthalmology;  Laterality: Left;  CDE: 11.65   CATARACT EXTRACTION W/PHACO Right 12/30/2017   Procedure: CATARACT EXTRACTION PHACO AND INTRAOCULAR LENS PLACEMENT RIGHT EYE;  Surgeon: Gemma Payor, MD;  Location: [facility];  Service: Ophthalmology;  Laterality: Right;  CDE: 9.07   COLONOSCOPY N/A 09/15/2015   Dr. Geni Bers multiple rectal and colonic polyps removed. Hepatic flexure with 9 mm polyp, multiple 5-7 mm polyps. Multiple cecal polyps with largest 1.5 cm, one 5 mm polyp in rectum. Path for colonic polyps with sessile  serrated polyp without dysplasia, no high grade dysplasia, tubular adenomas, rectal hyperplastic polyp   COLONOSCOPY WITH PROPOFOL N/A 07/20/2019   multiple tubular adenomas. diverticulosis. 3 year surveillance   COLONOSCOPY WITH PROPOFOL N/A 02/20/2023   Procedure: COLONOSCOPY WITH PROPOFOL;  Surgeon: Corbin Ade, MD;  Location: [facility];  Service: Endoscopy;  Laterality: N/A;  730am, asa 3   EYE SURGERY Bilateral 2019   Cat Sx - Dr. Gemma Payor   LEFT HEART CATHETERIZATION WITH CORONARY ANGIOGRAM N/A 07/23/2011   Procedure: LEFT HEART CATHETERIZATION WITH CORONARY ANGIOGRAM;  Surgeon: Wendall Stade, MD;  Location: [facility] C[facility];  Service: Cardiovascular;  Laterality: N/A;   POLYPECTOMY  07/20/2019   Procedure: POLYPECTOMY;  Surgeon: Corbin Ade, MD;  Location: [facility];  Service: Endoscopy;;   POLYPECTOMY  02/20/2023   Procedure: POLYPECTOMY INTESTINAL;  Surgeon: Corbin Ade, MD;  Location: [facility];  Service: Endoscopy;;   YAG LASER APPLICATION Right 09/01/2020   Dr. Rennis Chris   YAG LASER APPLICATION Left 09/01/2020   Dr. Rennis Chris   FAMILY HISTORY Family History  Problem Relation Age of Onset   Cancer Father    Diabetes Father    Colon cancer Sister    SOCIAL HISTORY Social History   Tobacco Use   Smoking status: Never   Smokeless tobacco: Never  Vaping Use   Vaping status: Never Used  Substance Use Topics   Alcohol use: No   Drug use: No       OPHTHALMIC EXAM:  Base Eye Exam     Visual Acuity (Snellen - Linear)       Right Left   Dist [place] 20/30 -2 20/25   Dist ph [place] NI NI         Tonometry (Tonopen, 8:09 AM)       Right Left   Pressure 15 16         Pupils       Pupils Dark Light Shape React APD   Right PERRL 3 2 Round Brisk None   Left PERRL 3 2 Round Brisk None         Visual Fields       Left Right    Full Full         Extraocular Movement       Right Left    Full, Ortho Full, Ortho          Neuro/Psych     Oriented x3: Yes   Mood/Affect: Normal         Dilation     Both eyes: 2.5% Phenylephrine @ 8:09 AM           Slit Lamp and Fundus Exam     Slit Lamp Exam  Right Left   Lids/Lashes Dermatochalasis - upper lid, mild MGD Dermatochalasis - upper lid, mild MGD   Conjunctiva/Sclera white and quiet white and quiet   Cornea 1-2+PEE, well healed temporal cataract wound 1+PEE, well healed temporal cataract wound   Anterior Chamber deep, clear, narrow temporal angle deep and clear   Iris round and dilated, No NVI round and dilated, No NVI   Lens PC IOL in good position, open PC PC IOL in good position with open PC   Vitreous syneresis, PVD syneresis, Posterior vitreous detachment, vitreous condensations         Fundus Exam       Right Left   Disc pink and sharp, compact, mild PPA pink and sharp, compact, mild PPP   C/D Ratio 0.3 0.1   Macula flat, blunted foveal reflex, ERM with striae inferiorly, punctate IRH - improved, +central cyst / edema, no exudates flat, blunted foveal reflex, mild ERM, rare MA, no edema, focal pigment clumping temporal to fovea   Vessels attenuated, tortuous greatest IT arcades attenuated, tortuous   Periphery attached, rare MA / DBH attached, rare MA/DBH, no RT/RD 360           Refraction     Wearing Rx       Sphere Cylinder Axis Add   Right [place]   +2.50   Left -0.25 +0.50 177 +2.50           IMAGING AND PROCEDURES  Imaging and Procedures for 08/21/2023  OCT, Retina - OU - Both Eyes       Right Eye Quality was good. Central Foveal Thickness: 498. Progression has been stable. Findings include no SRF, abnormal foveal contour, epiretinal membrane, intraretinal fluid, macular pucker (ERM with pucker -- stable; mild persistent IRF and central cyst ).   Left Eye Quality was good. Central Foveal Thickness: 259. Progression has been stable. Findings include normal foveal contour, no IRF, no SRF, epiretinal membrane,  macular pucker (ERM with early pucker, partial PVD).   Notes *Images captured and stored on drive  Diagnosis / Impression:  OD: ERM with pucker -- stable; mild persistent IRF and central cyst  OS: ERM with early pucker, partial PVD  Clinical management:  See below  Abbreviations: NFP - Normal foveal profile. CME - cystoid macular edema. PED - pigment epithelial detachment. IRF - intraretinal fluid. SRF - subretinal fluid. EZ - ellipsoid zone. ERM - epiretinal membrane. ORA - outer retinal atrophy. ORT - outer retinal tubulation. SRHM - subretinal hyper-reflective material. IRHM - intraretinal hyper-reflective material      Intravitreal Injection, Pharmacologic Agent - OD - Right Eye       Time Out 08/21/2023. 8:35 AM. Confirmed correct patient, procedure, site, and patient consented.   Anesthesia Topical anesthesia was used. Anesthetic medications included Lidocaine 2%, Proparacaine 0.5%.   Procedure Preparation included 5% betadine to ocular surface, eyelid speculum. A (32g) needle was used.   Injection: 2 mg aflibercept 2 MG/0.05ML   Route: Intravitreal, Site: Right Eye   NDC: [phone], Waste: 0 mL   Post-op Post injection exam found visual acuity of at least counting fingers. The patient tolerated the procedure well. There were no complications. The patient received written and verbal post procedure care education. Post injection medications were not given.              ASSESSMENT/PLAN:    ICD-10-CM   1. Epiretinal membrane (ERM) of both eyes  H35.373 OCT, Retina - OU - Both Eyes  2. Moderate nonproliferative diabetic retinopathy of both eyes with macular edema associated with type 2 diabetes mellitus (HCC)  Z61.0960 Intravitreal Injection, Pharmacologic Agent - OD - Right Eye    3. Current use of insulin (HCC)  Z79.4     4. Long term (current) use of oral hypoglycemic drugs  Z79.84     5. Long-term (current) use of injectable non-insulin antidiabetic  drugs  Z79.85     6. Essential hypertension  I10     7. Hypertensive retinopathy of both eyes  H35.033     8. Pseudophakia of both eyes  Z96.1      1. Epiretinal membrane OU -- stable  - BCVA OD 20/30; OS 20/20 - OCT shows OD: ERM with pucker and prominent central cyst; persistent IRF and central cyst; OS: mild ERM with early pucker, partial PVD    - IRF OD: ?DME vs CME vs cystic changes - started PF and Prolensa QID OD only for possible CME component on 12.8.22 -- continue, but suspect DME may be significant component  - no metamorphopsia  - monitor for now  - f/u 6 weeks -- DFE/OCT  2-5. Moderate nonproliferative diabetic retinopathy OU - s/p IVA OD #1 (02.16.23), #2 (03.20.23), #3 (04.17.23), #4 (05.22.23), #5 (06.22.23), #6 (07.27.23),  #7 (09.01.23) #8 (10.06.23), #9 (11.09.23), #10 (12.15.23) #11 (01.12.24), #12 (02.09.24) #13 (03.15.24) - s/p IVE OD #1 (04.18.24), #2 (05.24.24), #3 (07.01.24), #4 (08.12.24), #5 (09.25.24), #6 (11.06.24), #7 (12.18.24), #8 (01.29.25) - BCVA OD: stable at 20/30, OS: 20/20  - exam shows scattered MA, no NV - OCT shows OD: ERM with pucker stable; mild persistent IRF and central cyst; OS: ERM with early pucker, partial PVD at 6 weeks - recommend IVE OD #9 today, 03.12.25 for DME component w/ f/u in 6 wks - pt wishes to proceed - RBA of procedure discussed, questions answered - IVE informed consent obtained and signed, 04.19.24 (OD)  - have obtained [employer] approval for 2025 - [employer] funding unavailable -- pt covering 20% coinsurance - see procedure note  - f/u in 6 wks - DFE/OCT, possible injxn  6,7. Hypertensive retinopathy OU - discussed importance of tight BP control - monitor   8. Pseudophakia OU  - s/p CE/IOL OU, 2019 (Dr. Alto [place])  - IOLs in good position, doing well  - s/p YAG cap OD 3.24.22  - s/p YAG cap OS 04.07.22  - monitor  Ophthalmic Meds Ordered this visit:  No orders of the defined types were placed in this encounter.     No follow-ups on file.  There are no Patient Instructions on file for this visit.  This document serves as a record of services personally performed by Karie Chimera, MD, PhD. It was created on their behalf by Glee Arvin. Manson Passey, OA an ophthalmic technician. The creation of this record is the provider's dictation and/or activities during the visit.    Electronically signed by: Glee Arvin. Manson Passey, OA 08/21/23 8:44 AM    Karie Chimera, M.D., Ph.D. Diseases & Surgery of the Retina and Vitreous [facility] 08/23/2023     Abbreviations: M myopia (nearsighted); A astigmatism; H hyperopia (farsighted); P presbyopia; Mrx spectacle prescription;  CTL contact lenses; OD right eye; OS left eye; OU both eyes  XT exotropia; ET esotropia; PEK punctate epithelial keratitis; PEE punctate epithelial erosions; DES dry eye syndrome; MGD meibomian gland dysfunction; ATs artificial tears; PFAT's preservative free artificial tears; NSC nuclear sclerotic cataract; PSC posterior subcapsular cataract;  ERM epi-retinal membrane; PVD posterior vitreous detachment; RD retinal detachment; DM diabetes mellitus; DR diabetic retinopathy; NPDR non-proliferative diabetic retinopathy; PDR proliferative diabetic retinopathy; CSME clinically significant macular edema; DME diabetic macular edema; dbh dot blot hemorrhages; CWS cotton wool spot; POAG primary open angle glaucoma; C/D cup-to-disc ratio; HVF humphrey visual field; GVF goldmann visual field; OCT optical coherence tomography; IOP intraocular pressure; BRVO Branch retinal vein occlusion; CRVO central retinal vein occlusion; CRAO central retinal artery occlusion; BRAO branch retinal artery occlusion; RT retinal tear; SB scleral buckle; PPV pars plana vitrectomy; VH Vitreous hemorrhage; PRP panretinal laser photocoagulation; IVK intravitreal kenalog; VMT vitreomacular traction; MH Macular hole;  NVD neovascularization of the disc; NVE neovascularization  elsewhere; AREDS age related eye disease study; ARMD age related macular degeneration; POAG primary open angle glaucoma; EBMD epithelial/anterior basement membrane dystrophy; ACIOL anterior chamber intraocular lens; IOL intraocular lens; PCIOL posterior chamber intraocular lens; Phaco/IOL phacoemulsification with intraocular lens placement; PRK photorefractive keratectomy; LASIK laser assisted in situ keratomileusis; HTN hypertension; DM diabetes mellitus; COPD chronic obstructive pulmonary disease

## 2023-08-14 DIAGNOSIS — E785 Hyperlipidemia, unspecified: Secondary | ICD-10-CM | POA: Diagnosis not present

## 2023-08-14 DIAGNOSIS — R7301 Impaired fasting glucose: Secondary | ICD-10-CM | POA: Diagnosis not present

## 2023-08-14 DIAGNOSIS — E039 Hypothyroidism, unspecified: Secondary | ICD-10-CM | POA: Diagnosis not present

## 2023-08-20 ENCOUNTER — Other Ambulatory Visit (HOSPITAL_COMMUNITY): Payer: Self-pay | Admitting: Family Medicine

## 2023-08-20 DIAGNOSIS — E039 Hypothyroidism, unspecified: Secondary | ICD-10-CM | POA: Diagnosis not present

## 2023-08-20 DIAGNOSIS — Z1231 Encounter for screening mammogram for malignant neoplasm of breast: Secondary | ICD-10-CM

## 2023-08-20 DIAGNOSIS — M79676 Pain in unspecified toe(s): Secondary | ICD-10-CM | POA: Diagnosis not present

## 2023-08-20 DIAGNOSIS — L84 Corns and callosities: Secondary | ICD-10-CM | POA: Diagnosis not present

## 2023-08-20 DIAGNOSIS — Z6824 Body mass index (BMI) 24.0-24.9, adult: Secondary | ICD-10-CM | POA: Diagnosis not present

## 2023-08-20 DIAGNOSIS — I700 Atherosclerosis of aorta: Secondary | ICD-10-CM | POA: Diagnosis not present

## 2023-08-20 DIAGNOSIS — I10 Essential (primary) hypertension: Secondary | ICD-10-CM | POA: Diagnosis not present

## 2023-08-20 DIAGNOSIS — R809 Proteinuria, unspecified: Secondary | ICD-10-CM | POA: Diagnosis not present

## 2023-08-20 DIAGNOSIS — Z713 Dietary counseling and surveillance: Secondary | ICD-10-CM | POA: Diagnosis not present

## 2023-08-20 DIAGNOSIS — B351 Tinea unguium: Secondary | ICD-10-CM | POA: Diagnosis not present

## 2023-08-20 DIAGNOSIS — E785 Hyperlipidemia, unspecified: Secondary | ICD-10-CM | POA: Diagnosis not present

## 2023-08-20 DIAGNOSIS — E1142 Type 2 diabetes mellitus with diabetic polyneuropathy: Secondary | ICD-10-CM | POA: Diagnosis not present

## 2023-08-20 DIAGNOSIS — E1165 Type 2 diabetes mellitus with hyperglycemia: Secondary | ICD-10-CM | POA: Diagnosis not present

## 2023-08-20 DIAGNOSIS — Z79899 Other long term (current) drug therapy: Secondary | ICD-10-CM | POA: Diagnosis not present

## 2023-08-21 ENCOUNTER — Encounter (INDEPENDENT_AMBULATORY_CARE_PROVIDER_SITE_OTHER): Payer: Self-pay | Admitting: Ophthalmology

## 2023-08-21 ENCOUNTER — Ambulatory Visit (INDEPENDENT_AMBULATORY_CARE_PROVIDER_SITE_OTHER): Payer: Medicare Other | Admitting: Ophthalmology

## 2023-08-21 DIAGNOSIS — H35033 Hypertensive retinopathy, bilateral: Secondary | ICD-10-CM

## 2023-08-21 DIAGNOSIS — Z7985 Long-term (current) use of injectable non-insulin antidiabetic drugs: Secondary | ICD-10-CM | POA: Diagnosis not present

## 2023-08-21 DIAGNOSIS — I10 Essential (primary) hypertension: Secondary | ICD-10-CM

## 2023-08-21 DIAGNOSIS — E113313 Type 2 diabetes mellitus with moderate nonproliferative diabetic retinopathy with macular edema, bilateral: Secondary | ICD-10-CM | POA: Diagnosis not present

## 2023-08-21 DIAGNOSIS — Z961 Presence of intraocular lens: Secondary | ICD-10-CM | POA: Diagnosis not present

## 2023-08-21 DIAGNOSIS — Z7984 Long term (current) use of oral hypoglycemic drugs: Secondary | ICD-10-CM

## 2023-08-21 DIAGNOSIS — H35373 Puckering of macula, bilateral: Secondary | ICD-10-CM

## 2023-08-21 DIAGNOSIS — Z794 Long term (current) use of insulin: Secondary | ICD-10-CM | POA: Diagnosis not present

## 2023-08-21 MED ORDER — AFLIBERCEPT 2MG/0.05ML IZ SOLN FOR KALEIDOSCOPE
2.0000 mg | INTRAVITREAL | Status: AC | PRN
  Administered 2023-08-21: 2 mg via INTRAVITREAL

## 2023-10-01 NOTE — Progress Notes (Signed)
 . [facility] - Clinic Note  10/02/2023    CHIEF COMPLAINT Patient presents for Retina Follow Up  HISTORY OF PRESENT ILLNESS: Megan Schroeder is a 78 y.o. female who presents to the clinic today for:   HPI     Retina Follow Up   Patient presents with  Diabetic Retinopathy.  In both eyes.  Severity is moderate.  Since onset it is stable.  I, the attending physician,  performed the HPI with the patient and updated documentation appropriately.        Comments   Pt states vision has been about the same, blood sugar was 120 this am, using AT's TID      Last edited by Ronelle Coffee, MD on 10/02/2023 12:43 PM.    Pt feels like vision is the same   Referring physician: Wendi Ham, NP [address] [place],  [place] [address]  HISTORICAL INFORMATION:   Selected notes from the MEDICAL RECORD NUMBER Referred by Dr. Lincoln Renshaw   CURRENT MEDICATIONS: Current Outpatient Medications (Ophthalmic Drugs)  Medication Sig   Bromfenac  Sodium 0.07 % SOLN Place 1 drop into the right eye 4 (four) times daily.   Bromfenac  Sodium 0.07 % SOLN Place 1 drop into the right eye 4 (four) times daily.   prednisoLONE  acetate (PRED FORTE ) 1 % ophthalmic suspension INSTILL 1 DROP IN THE RIGHT EYE FOUR TIMES DAILY   No current facility-administered medications for this visit. (Ophthalmic Drugs)   Current Outpatient Medications (Other)  Medication Sig   amLODipine  (NORVASC ) 10 MG tablet Take 1 tablet (10 mg total) by mouth daily. For BP   aspirin  EC 81 MG tablet Take 1 tablet (81 mg total) by mouth daily as needed for mild pain (pain score 1-3).   atorvastatin  (LIPITOR) 40 MG tablet Take 40 mg by mouth daily.   cholecalciferol  (VITAMIN D ) 1000 UNITS tablet Take 1,000 Units by mouth daily.    Coenzyme Q10 (COQ-10) 200 MG CAPS Take 200 mg by mouth daily.    dapagliflozin  propanediol (FARXIGA ) 10 MG TABS tablet Take 1 tablet (10 mg total) by mouth daily.   Dulaglutide (TRULICITY)  4.5 MG/0.5ML SOPN Inject 4.5 mg into the skin every Sunday.   GNP ULTICARE PEN NEEDLES 31G X 5 MM MISC See admin instructions.   levothyroxine  (SYNTHROID ) 75 MCG tablet Take 1 tablet (75 mcg total) by mouth daily before breakfast.   TOUJEO  SOLOSTAR 300 UNIT/ML Solostar Pen Inject 10 Units into the skin at bedtime.   No current facility-administered medications for this visit. (Other)   REVIEW OF SYSTEMS: ROS   Negative for: Endocrine, Cardiovascular, Eyes Last edited by Jocelyn Mule, COT on 10/02/2023  8:13 AM.      ALLERGIES Allergies  Allergen Reactions   Penicillins Hives        Hydrocodone  Nausea And Vomiting   Sulfa Antibiotics Rash   PAST MEDICAL HISTORY Past Medical History:  Diagnosis Date   Anxiety    CAD (coronary artery disease)    Diabetic retinopathy (HCC)    NPDR OU   Hyperlipidemia    Hypertension    Hypertensive retinopathy    OU   Hypothyroidism    Type 2 diabetes mellitus (HCC)    Past Surgical History:  Procedure Laterality Date   BREAST EXCISIONAL BIOPSY Left    CATARACT EXTRACTION W/PHACO Left 12/16/2017   Procedure: CATARACT EXTRACTION PHACO AND INTRAOCULAR LENS PLACEMENT (IOC);  Surgeon: Anner Kill, MD;  Location: [facility];  Service:  Ophthalmology;  Laterality: Left;  CDE: 11.65   CATARACT EXTRACTION W/PHACO Right 12/30/2017   Procedure: CATARACT EXTRACTION PHACO AND INTRAOCULAR LENS PLACEMENT RIGHT EYE;  Surgeon: Anner Kill, MD;  Location: [facility];  Service: Ophthalmology;  Laterality: Right;  CDE: 9.07   COLONOSCOPY N/A 09/15/2015   Dr. Jacqulin Maus multiple rectal and colonic polyps removed. Hepatic flexure with 9 mm polyp, multiple 5-7 mm polyps. Multiple cecal polyps with largest 1.5 cm, one 5 mm polyp in rectum. Path for colonic polyps with sessile serrated polyp without dysplasia, no high grade dysplasia, tubular adenomas, rectal hyperplastic polyp   COLONOSCOPY WITH PROPOFOL  N/A 07/20/2019   multiple tubular adenomas. diverticulosis. 3 year  surveillance   COLONOSCOPY WITH PROPOFOL  N/A 02/20/2023   Procedure: COLONOSCOPY WITH PROPOFOL ;  Surgeon: Suzette Espy, MD;  Location: [facility];  Service: Endoscopy;  Laterality: N/A;  730am, asa 3   EYE SURGERY Bilateral 2019   Cat Sx - Dr. Anner Kill   LEFT HEART CATHETERIZATION WITH CORONARY ANGIOGRAM N/A 07/23/2011   Procedure: LEFT HEART CATHETERIZATION WITH CORONARY ANGIOGRAM;  Surgeon: Loyde Rule, MD;  Location: [facility] C[facility];  Service: Cardiovascular;  Laterality: N/A;   POLYPECTOMY  07/20/2019   Procedure: POLYPECTOMY;  Surgeon: Suzette Espy, MD;  Location: [facility];  Service: Endoscopy;;   POLYPECTOMY  02/20/2023   Procedure: POLYPECTOMY INTESTINAL;  Surgeon: Suzette Espy, MD;  Location: [facility];  Service: Endoscopy;;   YAG LASER APPLICATION Right 09/01/2020   Dr. Ronelle Coffee   YAG LASER APPLICATION Left 09/01/2020   Dr. Ronelle Coffee   FAMILY HISTORY Family History  Problem Relation Age of Onset   Cancer Father    Diabetes Father    Colon cancer Sister    SOCIAL HISTORY Social History   Tobacco Use   Smoking status: Never   Smokeless tobacco: Never  Vaping Use   Vaping status: Never Used  Substance Use Topics   Alcohol use: No   Drug use: No       OPHTHALMIC EXAM:  Base Eye Exam     Visual Acuity (Snellen - Linear)       Right Left   Dist [place] 20/30 -1 20/25   Dist ph [place] NI NI         Tonometry (Tonopen, 8:18 AM)       Right Left   Pressure 16 17         Visual Fields (Counting fingers)       Left Right    Full Full         Extraocular Movement       Right Left    Full, Ortho Full, Ortho         Neuro/Psych     Oriented x3: Yes   Mood/Affect: Normal         Dilation     1.0% Mydriacyl @ 8:18 AM           Slit Lamp and Fundus Exam     Slit Lamp Exam       Right Left   Lids/Lashes Dermatochalasis - upper lid, mild MGD Dermatochalasis - upper lid, mild MGD   Conjunctiva/Sclera white and  quiet white and quiet   Cornea 1-2+PEE, well healed temporal cataract wound 1+PEE, well healed temporal cataract wound   Anterior Chamber deep, clear, narrow temporal angle deep and clear   Iris round and dilated, No NVI round and dilated, No NVI   Lens PC IOL  in good position, open PC PC IOL in good position with open PC   Anterior Vitreous syneresis, PVD syneresis, Posterior vitreous detachment, vitreous condensations         Fundus Exam       Right Left   Disc pink and sharp, compact, mild PPA pink and sharp, compact, mild PPP   C/D Ratio 0.3 0.1   Macula flat, blunted foveal reflex, ERM with striae inferiorly, punctate IRH - improved, +central cyst / edema, no exudates flat, blunted foveal reflex, mild ERM, rare MA, no edema, focal pigment clumping temporal to fovea   Vessels attenuated, tortuous greatest IT arcades attenuated, tortuous   Periphery attached, rare MA / DBH attached, rare MA/DBH, no RT/RD 360           IMAGING AND PROCEDURES  Imaging and Procedures for 10/02/2023  OCT, Retina - OU - Both Eyes       Right Eye Quality was good. Central Foveal Thickness: 498. Progression has been stable. Findings include no SRF, abnormal foveal contour, epiretinal membrane, intraretinal fluid, macular pucker (ERM with pucker -- stable; mild persistent IRF and central cyst ).   Left Eye Quality was good. Central Foveal Thickness: 258. Progression has been stable. Findings include normal foveal contour, no IRF, no SRF, epiretinal membrane, macular pucker (ERM with early pucker, partial PVD).   Notes *Images captured and stored on drive  Diagnosis / Impression:  OD: ERM with pucker -- stable; mild persistent IRF and central cyst  OS: ERM with early pucker, partial PVD  Clinical management:  See below  Abbreviations: NFP - Normal foveal profile. CME - cystoid macular edema. PED - pigment epithelial detachment. IRF - intraretinal fluid. SRF - subretinal fluid. EZ - ellipsoid  zone. ERM - epiretinal membrane. ORA - outer retinal atrophy. ORT - outer retinal tubulation. SRHM - subretinal hyper-reflective material. IRHM - intraretinal hyper-reflective material      Intravitreal Injection, Pharmacologic Agent - OD - Right Eye       Time Out 10/02/2023. 8:58 AM. Confirmed correct patient, procedure, site, and patient consented.   Anesthesia Topical anesthesia was used. Anesthetic medications included Lidocaine  2%, Proparacaine 0.5%.   Procedure Preparation included 5% betadine  to ocular surface, eyelid speculum. A (32g) needle was used.   Injection: 2 mg aflibercept  2 MG/0.05ML   Route: Intravitreal, Site: Right Eye   NDC: [id number], Lot: [id number], Expiration date: 12/08/2024, Waste: 0 mL   Post-op Post injection exam found visual acuity of at least counting fingers. The patient tolerated the procedure well. There were no complications. The patient received written and verbal post procedure care education. Post injection medications were not given.            ASSESSMENT/PLAN:    ICD-10-CM   1. Moderate nonproliferative diabetic retinopathy of both eyes with macular edema associated with type 2 diabetes mellitus (HCC)  E11.3313 OCT, Retina - OU - Both Eyes    Intravitreal Injection, Pharmacologic Agent - OD - Right Eye    aflibercept  (EYLEA ) SOLN 2 mg    2. Current use of insulin  (HCC)  Z79.4     3. Long term (current) use of oral hypoglycemic drugs  Z79.84     4. Long-term (current) use of injectable non-insulin  antidiabetic drugs  Z79.85     5. Epiretinal membrane (ERM) of both eyes  H35.373     6. Essential hypertension  I10     7. Hypertensive retinopathy of both eyes  H35.033     8.  Pseudophakia of both eyes  Z96.1      1-4. Moderate nonproliferative diabetic retinopathy OU - s/p IVA OD #1 (02.16.23), #2 (03.20.23), #3 (04.17.23), #4 (05.22.23), #5 (06.22.23), #6 (07.27.23),  #7 (09.01.23) #8 (10.06.23), #9 (11.09.23), #10 (12.15.23)  #11 (01.12.24), #12 (02.09.24) #13 (03.15.24) - s/p IVE OD #1 (04.18.24), #2 (05.24.24), #3 (07.01.24), #4 (08.12.24), #5 (09.25.24), #6 (11.06.24), #7 (12.18.24), #8 (01.29.25), #9 (03.12.25) - BCVA OD: stable at 20/30, OS: 20/25  - exam shows scattered MA, no NV - OCT shows OD: ERM with pucker stable; mild persistent IRF and central cyst; OS: ERM with early pucker, partial PVD at 6 weeks - recommend IVE OD #10 today, 04.23.25 for DME component w/ f/u in 6 wks - pt wishes to proceed - RBA of procedure discussed, questions answered - IVE informed consent obtained and signed, 04.19.24 (OD)  - have obtained Eylea  approval for 2025 - [employer] funding unavailable -- pt covering 20% coinsurance - see procedure note  - f/u in 6 wks - DFE/OCT, possible injxn  5. Epiretinal membrane OU -- stable  - BCVA OD 20/30; OS 20/25 - OCT shows OD: ERM with pucker and prominent central cyst; persistent IRF and central cyst; OS: mild ERM with early pucker, partial PVD    - IRF OD: ?DME vs CME vs cystic changes - started PF and Prolensa  QID OD only for possible CME component on 12.8.22 -- continue, but suspect DME may be significant component  - no metamorphopsia  - monitor for now  - f/u 6 weeks -- DFE/OCT  6,7. Hypertensive retinopathy OU - discussed importance of tight BP control - monitor   8. Pseudophakia OU  - s/p CE/IOL OU, 2019 (Dr. Ethelle Herb)  - IOLs in good position, doing well  - s/p YAG cap OD 3.24.22  - s/p YAG cap OS 04.07.22  - monitor  Ophthalmic Meds Ordered this visit:  Meds ordered this encounter  Medications   aflibercept  (EYLEA ) SOLN 2 mg     Return in about 6 weeks (around 11/13/2023) for f/u NPDR OU, DFE, OCT, Possible Injxn.  There are no Patient Instructions on file for this visit.  This document serves as a record of services personally performed by Jeanice Millard, MD, PhD. It was created on their behalf by Morley Arabia. Bevin Bucks, OA an ophthalmic technician. The creation of this  record is the provider's dictation and/or activities during the visit.    Electronically signed by: Morley Arabia. Bevin Bucks, OA 10/06/23 2:28 AM  Jeanice Millard, M.D., Ph.D. Diseases & Surgery of the Retina and Vitreous [facility] 10/02/2023   I have reviewed the above documentation for accuracy and completeness, and I agree with the above. Jeanice Millard, M.D., Ph.D. 10/06/23 2:29 AM   Abbreviations: M myopia (nearsighted); A astigmatism; H hyperopia (farsighted); P presbyopia; Mrx spectacle prescription;  CTL contact lenses; OD right eye; OS left eye; OU both eyes  XT exotropia; ET esotropia; PEK punctate epithelial keratitis; PEE punctate epithelial erosions; DES dry eye syndrome; MGD meibomian gland dysfunction; ATs artificial tears; PFAT's preservative free artificial tears; NSC nuclear sclerotic cataract; PSC posterior subcapsular cataract; ERM epi-retinal membrane; PVD posterior vitreous detachment; RD retinal detachment; DM diabetes mellitus; DR diabetic retinopathy; NPDR non-proliferative diabetic retinopathy; PDR proliferative diabetic retinopathy; CSME clinically significant macular edema; DME diabetic macular edema; dbh dot blot hemorrhages; CWS cotton wool spot; POAG primary open angle glaucoma; C/D cup-to-disc ratio; HVF humphrey visual field; GVF goldmann visual field; OCT  optical coherence tomography; IOP intraocular pressure; BRVO Branch retinal vein occlusion; CRVO central retinal vein occlusion; CRAO central retinal artery occlusion; BRAO branch retinal artery occlusion; RT retinal tear; SB scleral buckle; PPV pars plana vitrectomy; VH Vitreous hemorrhage; PRP panretinal laser photocoagulation; IVK intravitreal kenalog; VMT vitreomacular traction; MH Macular hole;  NVD neovascularization of the disc; NVE neovascularization elsewhere; AREDS age related eye disease study; ARMD age related macular degeneration; POAG primary open angle glaucoma; EBMD epithelial/anterior  basement membrane dystrophy; ACIOL anterior chamber intraocular lens; IOL intraocular lens; PCIOL posterior chamber intraocular lens; Phaco/IOL phacoemulsification with intraocular lens placement; PRK photorefractive keratectomy; LASIK laser assisted in situ keratomileusis; HTN hypertension; DM diabetes mellitus; COPD chronic obstructive pulmonary disease

## 2023-10-02 ENCOUNTER — Ambulatory Visit (INDEPENDENT_AMBULATORY_CARE_PROVIDER_SITE_OTHER): Admitting: Ophthalmology

## 2023-10-02 ENCOUNTER — Encounter (INDEPENDENT_AMBULATORY_CARE_PROVIDER_SITE_OTHER): Payer: Self-pay | Admitting: Ophthalmology

## 2023-10-02 DIAGNOSIS — H35033 Hypertensive retinopathy, bilateral: Secondary | ICD-10-CM | POA: Diagnosis not present

## 2023-10-02 DIAGNOSIS — Z794 Long term (current) use of insulin: Secondary | ICD-10-CM | POA: Diagnosis not present

## 2023-10-02 DIAGNOSIS — Z7985 Long-term (current) use of injectable non-insulin antidiabetic drugs: Secondary | ICD-10-CM

## 2023-10-02 DIAGNOSIS — I10 Essential (primary) hypertension: Secondary | ICD-10-CM | POA: Diagnosis not present

## 2023-10-02 DIAGNOSIS — H35373 Puckering of macula, bilateral: Secondary | ICD-10-CM

## 2023-10-02 DIAGNOSIS — Z7984 Long term (current) use of oral hypoglycemic drugs: Secondary | ICD-10-CM | POA: Diagnosis not present

## 2023-10-02 DIAGNOSIS — Z961 Presence of intraocular lens: Secondary | ICD-10-CM | POA: Diagnosis not present

## 2023-10-02 DIAGNOSIS — E113313 Type 2 diabetes mellitus with moderate nonproliferative diabetic retinopathy with macular edema, bilateral: Secondary | ICD-10-CM

## 2023-10-02 MED ORDER — AFLIBERCEPT 2MG/0.05ML IZ SOLN FOR KALEIDOSCOPE
2.0000 mg | INTRAVITREAL | Status: AC | PRN
Start: 2023-10-02 — End: 2023-10-02
  Administered 2023-10-02: 2 mg via INTRAVITREAL

## 2023-10-07 ENCOUNTER — Ambulatory Visit: Attending: Cardiology | Admitting: Cardiology

## 2023-10-07 ENCOUNTER — Encounter: Payer: Self-pay | Admitting: Cardiology

## 2023-10-07 VITALS — BP 132/70 | HR 86 | Ht 63.0 in | Wt 154.6 lb

## 2023-10-07 DIAGNOSIS — E782 Mixed hyperlipidemia: Secondary | ICD-10-CM | POA: Diagnosis not present

## 2023-10-07 DIAGNOSIS — I10 Essential (primary) hypertension: Secondary | ICD-10-CM

## 2023-10-07 DIAGNOSIS — I2510 Atherosclerotic heart disease of native coronary artery without angina pectoris: Secondary | ICD-10-CM | POA: Diagnosis not present

## 2023-10-07 MED ORDER — ATORVASTATIN CALCIUM 80 MG PO TABS: 80.0000 mg | ORAL_TABLET | Freq: Every day | ORAL | 3 refills | Status: AC

## 2023-10-07 NOTE — Progress Notes (Signed)
    Cardiology Office Note  Date: 10/07/2023   ID: Megan Schroeder, DOB 08/03/1945, MRN [id number]  History of Present Illness: Megan Schroeder is a 78 y.o. female last seen in February 2024 by Ms. Clementine Cutting NP, I reviewed her note.  Our last visit was in 2022.  She is here with her husband for a follow-up visit.  She does not report any chest pain or unusual shortness of breath with typical activities.  She walks 2 miles a day for exercise.  No palpitations or syncope.  We went over her medications.  She states that she has been compliant with treatment although recalls that she was asked to increase her Lipitor, although not certain that she has been taking the correct dose.  Her last LDL was 104 in March.  She has been working on better glucose control with adjustments in diabetic regimen.  Physical Exam: VS:  BP 132/70   Pulse 86   Ht 5\' 3"  (1.6 m)   Wt 154 lb 9.6 oz (70.1 kg)   SpO2 98%   BMI 27.39 kg/m , BMI Body mass index is 27.39 kg/m.  Wt Readings from Last 3 Encounters:  10/07/23 154 lb 9.6 oz (70.1 kg)  07/02/23 153 lb (69.4 kg)  06/24/23 152 lb 5.4 oz (69.1 kg)    General: Patient appears comfortable at rest. HEENT: Conjunctiva and lids normal. Neck: Supple, no elevated JVP or carotid bruits. Lungs: Clear to auscultation, nonlabored breathing at rest. Cardiac: Regular rate and rhythm, no S3 or significant systolic murmur. Extremities: No pitting edema.  ECG:  An ECG dated 06/24/2023 was personally reviewed today and demonstrated:  Sinus rhythm with prolonged PR interval.  Labwork: 06/24/2023: ALT 16; AST 23 06/25/2023: Hemoglobin 10.2; Platelets 157; TSH 1.453 06/26/2023: BUN 8; Creatinine, Ser 0.56; Magnesium 2.1; Potassium 3.8; Sodium 137  March 2025: TSH 1.54, hemoglobin 12.1, platelets 200, BUN 13, creatinine 0.8, GFR 77, potassium 4.1, AST 20, ALT 17, cholesterol 185, triglycerides 202, HDL 46, LDL 104, hemoglobin A1c 7.7%  Other Studies Reviewed Today:  No  interval cardiac testing for review today.  Assessment and Plan:  1.  CAD, moderate distal LAD stenosis documented in 2013 felt to be best managed medically.  Follow-up Lexiscan  Myoview  in 2021 was low risk with no ischemia.  LVEF 65 to 70% at that time.  She does not report any angina, walks 2 miles a day for exercise.  I reviewed her most recent ECG.  Continue aspirin  81 mg daily, Farxiga  10 mg daily, increase Lipitor to 80 mg daily, and continue Zetia 10 mg daily.  2.  Primary hypertension.  She is also on Norvasc  10 mg daily.  No changes were made today.  3.  Mixed hyperlipidemia.  LDL 104 in March.  Increase Vitor to 80 mg daily and continue Zetia 10 mg daily.  Would like to get her LDL at least under 70.  Disposition:  Follow up  1 year.  Signed, Gerard Knight, M.D., F.A.C.C. [facility]

## 2023-10-07 NOTE — Patient Instructions (Addendum)
 Medication Instructions:  Your physician has recommended you make the following change in your medication:  Increase atorvastatin  to 80 mg daily Continue all other medications as prescribed  Labwork: none  Testing/Procedures: none  Follow-Up: Your physician recommends that you schedule a follow-up appointment in: 1 year. You will receive a reminder call in about 8-10 months reminding you to schedule your appointment. If you don't receive this call, please contact our office.  Any Other Special Instructions Will Be Listed Below (If Applicable).  If you need a refill on your cardiac medications before your next appointment, please call your pharmacy.

## 2023-11-05 DIAGNOSIS — L84 Corns and callosities: Secondary | ICD-10-CM | POA: Diagnosis not present

## 2023-11-05 DIAGNOSIS — B351 Tinea unguium: Secondary | ICD-10-CM | POA: Diagnosis not present

## 2023-11-05 DIAGNOSIS — E1142 Type 2 diabetes mellitus with diabetic polyneuropathy: Secondary | ICD-10-CM | POA: Diagnosis not present

## 2023-11-05 DIAGNOSIS — M79675 Pain in left toe(s): Secondary | ICD-10-CM | POA: Diagnosis not present

## 2023-11-05 DIAGNOSIS — M79674 Pain in right toe(s): Secondary | ICD-10-CM | POA: Diagnosis not present

## 2023-11-06 NOTE — Progress Notes (Signed)
 . [facility] - Clinic Note  11/13/2023    CHIEF COMPLAINT Patient presents for Retina Follow Up  HISTORY OF PRESENT ILLNESS: Megan Schroeder is a 78 y.o. female who presents to the clinic today for:   HPI     Retina Follow Up   Patient presents with  Diabetic Retinopathy.  In both eyes.  This started 6 weeks ago.  Duration of 6 weeks.  Since onset it is stable.  I, the attending physician,  performed the HPI with the patient and updated documentation appropriately.        Comments   6 week retina follow up NPDR and IVE OD pt is reporting no vision changes noticed she denies any flashes has some floaters pt last reading 140 A1C 7.1      Last edited by Ronelle Coffee, MD on 11/13/2023  1:13 PM.     Pt states her vision is about the same  Referring physician: Wendi Ham, NP [address] [place],  [place] [address]  HISTORICAL INFORMATION:   Selected notes from the MEDICAL RECORD NUMBER Referred by Dr. Lincoln Renshaw   CURRENT MEDICATIONS: Current Outpatient Medications (Ophthalmic Drugs)  Medication Sig   Bromfenac  Sodium 0.07 % SOLN Place 1 drop into the right eye 4 (four) times daily.   prednisoLONE  acetate (PRED FORTE ) 1 % ophthalmic suspension INSTILL 1 DROP IN THE RIGHT EYE FOUR TIMES DAILY   No current facility-administered medications for this visit. (Ophthalmic Drugs)   Current Outpatient Medications (Other)  Medication Sig   amLODipine  (NORVASC ) 10 MG tablet Take 1 tablet (10 mg total) by mouth daily. For BP   aspirin  EC 81 MG tablet Take 1 tablet (81 mg total) by mouth daily as needed for mild pain (pain score 1-3).   atorvastatin  (LIPITOR) 80 MG tablet Take 1 tablet (80 mg total) by mouth daily.   cholecalciferol  (VITAMIN D ) 1000 UNITS tablet Take 1,000 Units by mouth daily.    Coenzyme Q10 (COQ-10) 200 MG CAPS Take 200 mg by mouth daily.    Continuous Glucose Sensor (FREESTYLE LIBRE 3 PLUS SENSOR) MISC as directed.   dapagliflozin   propanediol (FARXIGA ) 10 MG TABS tablet Take 1 tablet (10 mg total) by mouth daily.   Dulaglutide (TRULICITY) 4.5 MG/0.5ML SOPN Inject 4.5 mg into the skin every Sunday.   ezetimibe (ZETIA) 10 MG tablet Take 10 mg by mouth daily.   GNP ULTICARE PEN NEEDLES 31G X 5 MM MISC See admin instructions.   levothyroxine  (SYNTHROID ) 75 MCG tablet Take 1 tablet (75 mcg total) by mouth daily before breakfast.   TOUJEO  SOLOSTAR 300 UNIT/ML Solostar Pen Inject 10 Units into the skin at bedtime.   No current facility-administered medications for this visit. (Other)   REVIEW OF SYSTEMS: ROS   Negative for: Endocrine, Cardiovascular, Eyes Last edited by Alise Appl, COT on 11/13/2023  7:58 AM.       ALLERGIES Allergies  Allergen Reactions   Penicillins Hives        Hydrocodone  Nausea And Vomiting   Sulfa Antibiotics Rash   PAST MEDICAL HISTORY Past Medical History:  Diagnosis Date   Anxiety    CAD (coronary artery disease)    Diabetic retinopathy (HCC)    NPDR OU   Hyperlipidemia    Hypertension    Hypertensive retinopathy    OU   Hypothyroidism    Type 2 diabetes mellitus (HCC)    Past Surgical History:  Procedure Laterality Date   BREAST  EXCISIONAL BIOPSY Left    CATARACT EXTRACTION W/PHACO Left 12/16/2017   Procedure: CATARACT EXTRACTION PHACO AND INTRAOCULAR LENS PLACEMENT (IOC);  Surgeon: Anner Kill, MD;  Location: [facility];  Service: Ophthalmology;  Laterality: Left;  CDE: 11.65   CATARACT EXTRACTION W/PHACO Right 12/30/2017   Procedure: CATARACT EXTRACTION PHACO AND INTRAOCULAR LENS PLACEMENT RIGHT EYE;  Surgeon: Anner Kill, MD;  Location: [facility];  Service: Ophthalmology;  Laterality: Right;  CDE: 9.07   COLONOSCOPY N/A 09/15/2015   Dr. Jacqulin Maus multiple rectal and colonic polyps removed. Hepatic flexure with 9 mm polyp, multiple 5-7 mm polyps. Multiple cecal polyps with largest 1.5 cm, one 5 mm polyp in rectum. Path for colonic polyps with sessile serrated polyp without  dysplasia, no high grade dysplasia, tubular adenomas, rectal hyperplastic polyp   COLONOSCOPY WITH PROPOFOL  N/A 07/20/2019   multiple tubular adenomas. diverticulosis. 3 year surveillance   COLONOSCOPY WITH PROPOFOL  N/A 02/20/2023   Procedure: COLONOSCOPY WITH PROPOFOL ;  Surgeon: Suzette Espy, MD;  [facility];  Service: Endoscopy;  Laterality: N/A;  730am, asa 3   EYE SURGERY Bilateral 2019   Cat Sx - Dr. Anner Kill   LEFT HEART CATHETERIZATION WITH CORONARY ANGIOGRAM N/A 07/23/2011   Procedure: LEFT HEART CATHETERIZATION WITH CORONARY ANGIOGRAM;  Surgeon: Loyde Rule, MD;  Location: [facility] CATH LAB;  Service: Cardiovascular;  Laterality: N/A;   POLYPECTOMY  07/20/2019   Procedure: POLYPECTOMY;  Surgeon: Suzette Espy, MD;  [facility]: [facility];  Service: Endoscopy;;   POLYPECTOMY  02/20/2023   Procedure: POLYPECTOMY INTESTINAL;  Surgeon: Suzette Espy, MD;  [facility];  Service: Endoscopy;;   YAG LASER APPLICATION Right 09/01/2020   Dr. Ronelle Coffee   YAG LASER APPLICATION Left 09/01/2020   Dr. Ronelle Coffee   FAMILY HISTORY Family History  Problem Relation Age of Onset   Cancer Father    Diabetes Father    Colon cancer Sister    SOCIAL HISTORY Social History   Tobacco Use   Smoking status: Never   Smokeless tobacco: Never  Vaping Use   Vaping status: Never Used  Substance Use Topics   Alcohol use: No   Drug use: No       OPHTHALMIC EXAM:  Base Eye Exam     Visual Acuity (Snellen - Linear)       Right Left   Dist Circle 20/30 -2 20/20 -1   Dist ph [place] NI          Tonometry (Tonopen, 8:01 AM)       Right Left   Pressure 21 20         Pupils       Pupils Dark Light Shape React APD   Right PERRL 3 2 Round Brisk None   Left PERRL 3 2 Round Brisk None         Visual Fields       Left Right    Full Full         Extraocular Movement       Right Left    Full, Ortho Full, Ortho         Neuro/Psych     Oriented  x3: Yes   Mood/Affect: Normal         Dilation     Both eyes: 2.5% Phenylephrine  @ 8:02 AM           Slit Lamp and Fundus Exam     Slit Lamp Exam  Right Left   Lids/Lashes Dermatochalasis - upper lid, mild MGD Dermatochalasis - upper lid, mild MGD   Conjunctiva/Sclera white and quiet white and quiet   Cornea 1-2+PEE, well healed temporal cataract wound 1+PEE, well healed temporal cataract wound   Anterior Chamber deep, clear, narrow temporal angle deep and clear   Iris round and dilated, No NVI round and dilated, No NVI   Lens PC IOL in good position, open PC PC IOL in good position with open PC   Anterior Vitreous syneresis, PVD syneresis, Posterior vitreous detachment, vitreous condensations         Fundus Exam       Right Left   Disc pink and sharp, compact, mild PPA pink and sharp, compact, mild PPP   C/D Ratio 0.3 0.1   Macula flat, blunted foveal reflex, ERM with striae inferiorly, punctate IRH - improved, +central cyst / edema, no exudates flat, blunted foveal reflex, mild ERM, rare MA, no edema, focal pigment clumping temporal to fovea, no heme   Vessels attenuated, tortuous greatest IT arcades attenuated, tortuous   Periphery attached, rare MA / DBH attached, rare MA/DBH, no RT/RD 360           Refraction     Wearing Rx       Sphere Cylinder Axis Add   Right [place]   +2.50   Left -0.25 +0.50 177 +2.50           IMAGING AND PROCEDURES  Imaging and Procedures for 11/13/2023  OCT, Retina - OU - Both Eyes       Right Eye Quality was good. Central Foveal Thickness: 487. Progression has been stable. Findings include no SRF, abnormal foveal contour, epiretinal membrane, intraretinal fluid, macular pucker (ERM with pucker -- stable; mild persistent IRF and central cyst ).   Left Eye Quality was good. Central Foveal Thickness: 255. Progression has been stable. Findings include normal foveal contour, no IRF, no SRF, epiretinal membrane, macular  pucker (ERM with early pucker, partial PVD).   Notes *Images captured and stored on drive  Diagnosis / Impression:  OD: ERM with pucker -- stable; mild persistent IRF and central cyst  OS: ERM with early pucker, partial PVD  Clinical management:  See below  Abbreviations: NFP - Normal foveal profile. CME - cystoid macular edema. PED - pigment epithelial detachment. IRF - intraretinal fluid. SRF - subretinal fluid. EZ - ellipsoid zone. ERM - epiretinal membrane. ORA - outer retinal atrophy. ORT - outer retinal tubulation. SRHM - subretinal hyper-reflective material. IRHM - intraretinal hyper-reflective material      Intravitreal Injection, Pharmacologic Agent - OD - Right Eye       Time Out 11/13/2023. 8:48 AM. Confirmed correct patient, procedure, site, and patient consented.   Anesthesia Topical anesthesia was used. Anesthetic medications included Lidocaine  2%, Proparacaine 0.5%.   Procedure Preparation included 5% betadine  to ocular surface, eyelid speculum. A (32g) needle was used.   Injection: 2 mg aflibercept  2 MG/0.05ML   Route: Intravitreal, Site: Right Eye   NDC: Q956576, Lot: [id number], Expiration date: 02/08/2025, Waste: 0 mL   Post-op Post injection exam found visual acuity of at least counting fingers. The patient tolerated the procedure well. There were no complications. The patient received written and verbal post procedure care education. Post injection medications were not given.             ASSESSMENT/PLAN:    ICD-10-CM   1. Moderate nonproliferative diabetic retinopathy of both eyes with macular edema associated  with type 2 diabetes mellitus (HCC)  E11.3313 OCT, Retina - OU - Both Eyes    Intravitreal Injection, Pharmacologic Agent - OD - Right Eye    aflibercept  (EYLEA ) SOLN 2 mg    2. Current use of insulin  (HCC)  Z79.4     3. Long term (current) use of oral hypoglycemic drugs  Z79.84     4. Long-term (current) use of injectable non-insulin   antidiabetic drugs  Z79.85     5. Epiretinal membrane (ERM) of both eyes  H35.373     6. Essential hypertension  I10     7. Hypertensive retinopathy of both eyes  H35.033     8. Pseudophakia of both eyes  Z96.1       1-4. Moderate nonproliferative diabetic retinopathy OU - s/p IVA OD #1 (02.16.23), #2 (03.20.23), #3 (04.17.23), #4 (05.22.23), #5 (06.22.23), #6 (07.27.23),  #7 (09.01.23) #8 (10.06.23), #9 (11.09.23), #10 (12.15.23) #11 (01.12.24), #12 (02.09.24) #13 (03.15.24) - s/p IVE OD #1 (04.18.24), #2 (05.24.24), #3 (07.01.24), #4 (08.12.24), #5 (09.25.24), #6 (11.06.24), #7 (12.18.24), #8 (01.29.25), #9 (03.12.25), #10 (04.23.25) - BCVA OD: stable at 20/30, OS: 20/20 20/25  - exam shows scattered MA, no NV - OCT shows OD: ERM with pucker stable; mild persistent IRF and central cyst; OS: ERM with early pucker, partial PVD at 6 weeks - recommend IVE OD #11 today, 06.04.25 for DME component w/ f/u in 6 wks - pt wishes to proceed - RBA of procedure discussed, questions answered - IVE informed consent obtained and signed, 04.19.24 (OD)  - have obtained Eylea  approval for 2025 - [employer] funding unavailable -- pt covering 20% coinsurance - see procedure note  - f/u in 6 wks - DFE/OCT, possible injxn  5. Epiretinal membrane OU -- stable  - BCVA OD 20/30; OS 20/25 - OCT shows OD: ERM with pucker and prominent central cyst; persistent IRF and central cyst; OS: mild ERM with early pucker, partial PVD    - IRF OD: ?DME vs CME vs cystic changes - started PF and Prolensa  QID OD only for possible CME component on 12.8.22 -- continue, but suspect DME may be significant component  - no metamorphopsia  - monitor for now  - f/u 6 weeks -- DFE/OCT  6,7. Hypertensive retinopathy OU - discussed importance of tight BP control - monitor   8. Pseudophakia OU  - s/p CE/IOL OU, 2019 (Dr. Ethelle Herb)  - IOLs in good position, doing well  - s/p YAG cap OD 3.24.22  - s/p YAG cap OS 04.07.22  -  monitor  Ophthalmic Meds Ordered this visit:  Meds ordered this encounter  Medications   aflibercept  (EYLEA ) SOLN 2 mg     Return in about 6 weeks (around 12/25/2023) for +DME OD, Dilated Exam, OCT, Possible Injxn.  There are no Patient Instructions on file for this visit.  This document serves as a record of services personally performed by Jeanice Millard, MD, PhD. It was created on their behalf by Angelia Kelp, an ophthalmic technician. The creation of this record is the provider's dictation and/or activities during the visit.    Electronically signed by: Angelia Kelp, OA, 11/21/23  3:39 AM  This document serves as a record of services personally performed by Jeanice Millard, MD, PhD. It was created on their behalf by Morley Arabia. Bevin Bucks, OA an ophthalmic technician. The creation of this record is the provider's dictation and/or activities during the visit.    Electronically signed by: Morley Arabia. Bevin Bucks,  OA 11/21/23 3:39 AM  Jeanice Millard, M.D., Ph.D. Diseases & Surgery of the Retina and Vitreous [facility] 06/042025   I have reviewed the above documentation for accuracy and completeness, and I agree with the above. Jeanice Millard, M.D., Ph.D. 11/21/23 3:41 AM   Abbreviations: M myopia (nearsighted); A astigmatism; H hyperopia (farsighted); P presbyopia; Mrx spectacle prescription;  CTL contact lenses; OD right eye; OS left eye; OU both eyes  XT exotropia; ET esotropia; PEK punctate epithelial keratitis; PEE punctate epithelial erosions; DES dry eye syndrome; MGD meibomian gland dysfunction; ATs artificial tears; PFAT's preservative free artificial tears; NSC nuclear sclerotic cataract; PSC posterior subcapsular cataract; ERM epi-retinal membrane; PVD posterior vitreous detachment; RD retinal detachment; DM diabetes mellitus; DR diabetic retinopathy; NPDR non-proliferative diabetic retinopathy; PDR proliferative diabetic retinopathy; CSME clinically  significant macular edema; DME diabetic macular edema; dbh dot blot hemorrhages; CWS cotton wool spot; POAG primary open angle glaucoma; C/D cup-to-disc ratio; HVF humphrey visual field; GVF goldmann visual field; OCT optical coherence tomography; IOP intraocular pressure; BRVO Branch retinal vein occlusion; CRVO central retinal vein occlusion; CRAO central retinal artery occlusion; BRAO branch retinal artery occlusion; RT retinal tear; SB scleral buckle; PPV pars plana vitrectomy; VH Vitreous hemorrhage; PRP panretinal laser photocoagulation; IVK intravitreal kenalog; VMT vitreomacular traction; MH Macular hole;  NVD neovascularization of the disc; NVE neovascularization elsewhere; AREDS age related eye disease study; ARMD age related macular degeneration; POAG primary open angle glaucoma; EBMD epithelial/anterior basement membrane dystrophy; ACIOL anterior chamber intraocular lens; IOL intraocular lens; PCIOL posterior chamber intraocular lens; Phaco/IOL phacoemulsification with intraocular lens placement; PRK photorefractive keratectomy; LASIK laser assisted in situ keratomileusis; HTN hypertension; DM diabetes mellitus; COPD chronic obstructive pulmonary disease

## 2023-11-13 ENCOUNTER — Encounter (INDEPENDENT_AMBULATORY_CARE_PROVIDER_SITE_OTHER): Payer: Self-pay | Admitting: Ophthalmology

## 2023-11-13 ENCOUNTER — Ambulatory Visit (INDEPENDENT_AMBULATORY_CARE_PROVIDER_SITE_OTHER): Admitting: Ophthalmology

## 2023-11-13 DIAGNOSIS — Z794 Long term (current) use of insulin: Secondary | ICD-10-CM

## 2023-11-13 DIAGNOSIS — E113313 Type 2 diabetes mellitus with moderate nonproliferative diabetic retinopathy with macular edema, bilateral: Secondary | ICD-10-CM | POA: Diagnosis not present

## 2023-11-13 DIAGNOSIS — I10 Essential (primary) hypertension: Secondary | ICD-10-CM

## 2023-11-13 DIAGNOSIS — H35373 Puckering of macula, bilateral: Secondary | ICD-10-CM

## 2023-11-13 DIAGNOSIS — Z7984 Long term (current) use of oral hypoglycemic drugs: Secondary | ICD-10-CM | POA: Diagnosis not present

## 2023-11-13 DIAGNOSIS — Z7985 Long-term (current) use of injectable non-insulin antidiabetic drugs: Secondary | ICD-10-CM

## 2023-11-13 DIAGNOSIS — Z961 Presence of intraocular lens: Secondary | ICD-10-CM

## 2023-11-13 DIAGNOSIS — H35033 Hypertensive retinopathy, bilateral: Secondary | ICD-10-CM

## 2023-11-13 MED ORDER — AFLIBERCEPT 2MG/0.05ML IZ SOLN FOR KALEIDOSCOPE
2.0000 mg | INTRAVITREAL | Status: AC | PRN
  Administered 2023-11-13: 2 mg via INTRAVITREAL

## 2023-11-27 DIAGNOSIS — E039 Hypothyroidism, unspecified: Secondary | ICD-10-CM | POA: Diagnosis not present

## 2023-11-27 DIAGNOSIS — R7301 Impaired fasting glucose: Secondary | ICD-10-CM | POA: Diagnosis not present

## 2023-11-27 DIAGNOSIS — E785 Hyperlipidemia, unspecified: Secondary | ICD-10-CM | POA: Diagnosis not present

## 2023-12-03 DIAGNOSIS — E1165 Type 2 diabetes mellitus with hyperglycemia: Secondary | ICD-10-CM | POA: Diagnosis not present

## 2023-12-03 DIAGNOSIS — E039 Hypothyroidism, unspecified: Secondary | ICD-10-CM | POA: Diagnosis not present

## 2023-12-03 DIAGNOSIS — R809 Proteinuria, unspecified: Secondary | ICD-10-CM | POA: Diagnosis not present

## 2023-12-03 DIAGNOSIS — I10 Essential (primary) hypertension: Secondary | ICD-10-CM | POA: Diagnosis not present

## 2023-12-03 DIAGNOSIS — I700 Atherosclerosis of aorta: Secondary | ICD-10-CM | POA: Diagnosis not present

## 2023-12-03 DIAGNOSIS — E785 Hyperlipidemia, unspecified: Secondary | ICD-10-CM | POA: Diagnosis not present

## 2023-12-10 ENCOUNTER — Other Ambulatory Visit (INDEPENDENT_AMBULATORY_CARE_PROVIDER_SITE_OTHER): Payer: Self-pay

## 2023-12-10 DIAGNOSIS — H35033 Hypertensive retinopathy, bilateral: Secondary | ICD-10-CM

## 2023-12-10 DIAGNOSIS — Z7984 Long term (current) use of oral hypoglycemic drugs: Secondary | ICD-10-CM

## 2023-12-10 DIAGNOSIS — Z961 Presence of intraocular lens: Secondary | ICD-10-CM

## 2023-12-10 DIAGNOSIS — Z794 Long term (current) use of insulin: Secondary | ICD-10-CM

## 2023-12-10 DIAGNOSIS — I10 Essential (primary) hypertension: Secondary | ICD-10-CM

## 2023-12-10 DIAGNOSIS — E113313 Type 2 diabetes mellitus with moderate nonproliferative diabetic retinopathy with macular edema, bilateral: Secondary | ICD-10-CM

## 2023-12-10 DIAGNOSIS — H35373 Puckering of macula, bilateral: Secondary | ICD-10-CM

## 2023-12-10 DIAGNOSIS — Z7985 Long-term (current) use of injectable non-insulin antidiabetic drugs: Secondary | ICD-10-CM

## 2023-12-10 NOTE — Progress Notes (Signed)
 . [facility] - Clinic Note  12/10/2023    CHIEF COMPLAINT Patient presents for No chief complaint on file.  HISTORY OF PRESENT ILLNESS: Megan Schroeder is a 78 y.o. female who presents to the clinic today for:     Pt states her vision is about the same  Referring physician: No referring provider defined for this encounter.  HISTORICAL INFORMATION:   Selected notes from the MEDICAL RECORD NUMBER Referred by Dr. Vicci   CURRENT MEDICATIONS: Current Outpatient Medications (Ophthalmic Drugs)  Medication Sig   Bromfenac  Sodium 0.07 % SOLN Place 1 drop into the right eye 4 (four) times daily.   prednisoLONE  acetate (PRED FORTE ) 1 % ophthalmic suspension INSTILL 1 DROP IN THE RIGHT EYE FOUR TIMES DAILY   No current facility-administered medications for this visit. (Ophthalmic Drugs)   Current Outpatient Medications (Other)  Medication Sig   amLODipine  (NORVASC ) 10 MG tablet Take 1 tablet (10 mg total) by mouth daily. For BP   aspirin  EC 81 MG tablet Take 1 tablet (81 mg total) by mouth daily as needed for mild pain (pain score 1-3).   atorvastatin  (LIPITOR) 80 MG tablet Take 1 tablet (80 mg total) by mouth daily.   cholecalciferol  (VITAMIN D ) 1000 UNITS tablet Take 1,000 Units by mouth daily.    Coenzyme Q10 (COQ-10) 200 MG CAPS Take 200 mg by mouth daily.    Continuous Glucose Sensor (FREESTYLE LIBRE 3 PLUS SENSOR) MISC as directed.   dapagliflozin  propanediol (FARXIGA ) 10 MG TABS tablet Take 1 tablet (10 mg total) by mouth daily.   Dulaglutide (TRULICITY) 4.5 MG/0.5ML SOPN Inject 4.5 mg into the skin every Sunday.   ezetimibe (ZETIA) 10 MG tablet Take 10 mg by mouth daily.   GNP ULTICARE PEN NEEDLES 31G X 5 MM MISC See admin instructions.   levothyroxine  (SYNTHROID ) 75 MCG tablet Take 1 tablet (75 mcg total) by mouth daily before breakfast.   TOUJEO  SOLOSTAR 300 UNIT/ML Solostar Pen Inject 10 Units into the skin at bedtime.   No current  facility-administered medications for this visit. (Other)   REVIEW OF SYSTEMS:     ALLERGIES Allergies  Allergen Reactions   Penicillins Hives        Hydrocodone  Nausea And Vomiting   Sulfa Antibiotics Rash   PAST MEDICAL HISTORY Past Medical History:  Diagnosis Date   Anxiety    CAD (coronary artery disease)    Diabetic retinopathy (HCC)    NPDR OU   Hyperlipidemia    Hypertension    Hypertensive retinopathy    OU   Hypothyroidism    Type 2 diabetes mellitus (HCC)    Past Surgical History:  Procedure Laterality Date   BREAST EXCISIONAL BIOPSY Left    CATARACT EXTRACTION W/PHACO Left 12/16/2017   Procedure: CATARACT EXTRACTION PHACO AND INTRAOCULAR LENS PLACEMENT (IOC);  Surgeon: Perley Hamilton, MD;  Location: [facility];  Service: Ophthalmology;  Laterality: Left;  CDE: 11.65   CATARACT EXTRACTION W/PHACO Right 12/30/2017   Procedure: CATARACT EXTRACTION PHACO AND INTRAOCULAR LENS PLACEMENT RIGHT EYE;  Surgeon: Perley Hamilton, MD;  Location: [facility];  Service: Ophthalmology;  Laterality: Right;  CDE: 9.07   COLONOSCOPY N/A 09/15/2015   Dr. Tyra multiple rectal and colonic polyps removed. Hepatic flexure with 9 mm polyp, multiple 5-7 mm polyps. Multiple cecal polyps with largest 1.5 cm, one 5 mm polyp in rectum. Path for colonic polyps with sessile serrated polyp without dysplasia, no high grade dysplasia, tubular adenomas, rectal hyperplastic polyp  COLONOSCOPY WITH PROPOFOL  N/A 07/20/2019   multiple tubular adenomas. diverticulosis. 3 year surveillance   COLONOSCOPY WITH PROPOFOL  N/A 02/20/2023   Procedure: COLONOSCOPY WITH PROPOFOL ;  Surgeon: Shaaron Lamar HERO, MD;  [facility];  Service: Endoscopy;  Laterality: N/A;  730am, asa 3   EYE SURGERY Bilateral 2019   Cat Sx - Dr. Cherene Mania   LEFT HEART CATHETERIZATION WITH CORONARY ANGIOGRAM N/A 07/23/2011   Procedure: LEFT HEART CATHETERIZATION WITH CORONARY ANGIOGRAM;  Surgeon: Maude JAYSON Emmer, MD;  Location: [facility] CATH LAB;   Service: Cardiovascular;  Laterality: N/A;   POLYPECTOMY  07/20/2019   Procedure: POLYPECTOMY;  Surgeon: Shaaron Lamar HERO, MD;  [facility];  Service: Endoscopy;;   POLYPECTOMY  02/20/2023   Procedure: POLYPECTOMY INTESTINAL;  Surgeon: Shaaron Lamar HERO, MD;  [facility];  Service: Endoscopy;;   YAG LASER APPLICATION Right 09/01/2020   Dr. Redell Hans   YAG LASER APPLICATION Left 09/01/2020   Dr. Redell Hans   FAMILY HISTORY Family History  Problem Relation Age of Onset   Cancer Father    Diabetes Father    Colon cancer Sister    SOCIAL HISTORY Social History   Tobacco Use   Smoking status: Never   Smokeless tobacco: Never  Vaping Use   Vaping status: Never Used  Substance Use Topics   Alcohol use: No   Drug use: No       OPHTHALMIC EXAM:  Not recorded    IMAGING AND PROCEDURES  Imaging and Procedures for 12/10/2023           ASSESSMENT/PLAN:  No diagnosis found.   1-4. Moderate nonproliferative diabetic retinopathy OU - s/p IVA OD #1 (02.16.23), #2 (03.20.23), #3 (04.17.23), #4 (05.22.23), #5 (06.22.23), #6 (07.27.23),  #7 (09.01.23) #8 (10.06.23), #9 (11.09.23), #10 (12.15.23) #11 (01.12.24), #12 (02.09.24) #13 (03.15.24) - s/p IVE OD #1 (04.18.24), #2 (05.24.24), #3 (07.01.24), #4 (08.12.24), #5 (09.25.24), #6 (11.06.24), #7 (12.18.24), #8 (01.29.25), #9 (03.12.25), #10 (04.23.25), #11 (06.04.25) - BCVA OD: stable at 20/30, OS: 20/20 20/25  - exam shows scattered MA, no NV - OCT shows OD: ERM with pucker stable; mild persistent IRF and central cyst; OS: ERM with early pucker, partial PVD at 6 weeks - recommend IVE OD #12 today, 07.16.25 for DME component w/ f/u in 6 wks - pt wishes to proceed - RBA of procedure discussed, questions answered - IVE informed consent obtained and signed, 04.19.24 (OD)  - have obtained Eylea  approval for 2025 - [employer] funding unavailable -- pt covering 20% coinsurance - see procedure note  - f/u in 6  wks - DFE/OCT, possible injxn  5. Epiretinal membrane OU -- stable  - BCVA OD 20/30; OS 20/25 - OCT shows OD: ERM with pucker and prominent central cyst; persistent IRF and central cyst; OS: mild ERM with early pucker, partial PVD    - IRF OD: ?DME vs CME vs cystic changes - started PF and Prolensa  QID OD only for possible CME component on 12.8.22 -- continue, but suspect DME may be significant component  - no metamorphopsia  - monitor for now  - f/u 6 weeks -- DFE/OCT  6,7. Hypertensive retinopathy OU - discussed importance of tight BP control - monitor   8. Pseudophakia OU  - s/p CE/IOL OU, 2019 (Dr. Mania)  - IOLs in good position, doing well  - s/p YAG cap OD 3.24.22  - s/p YAG cap OS 04.07.22  - monitor  Ophthalmic Meds Ordered this  visit:  No orders of the defined types were placed in this encounter.    No follow-ups on file.  There are no Patient Instructions on file for this visit.  This document serves as a record of services personally performed by Redell JUDITHANN Hans, MD, PhD. It was created on their behalf by Almetta Pesa, an ophthalmic technician. The creation of this record is the provider's dictation and/or activities during the visit.    Electronically signed by: Almetta Pesa, OA, 12/10/23  10:06 AM    Redell JUDITHANN Hans, M.D., Ph.D. Diseases & Surgery of the Retina and Vitreous [facility] 06/042025     Abbreviations: M myopia (nearsighted); A astigmatism; H hyperopia (farsighted); P presbyopia; Mrx spectacle prescription;  CTL contact lenses; OD right eye; OS left eye; OU both eyes  XT exotropia; ET esotropia; PEK punctate epithelial keratitis; PEE punctate epithelial erosions; DES dry eye syndrome; MGD meibomian gland dysfunction; ATs artificial tears; PFAT's preservative free artificial tears; NSC nuclear sclerotic cataract; PSC posterior subcapsular cataract; ERM epi-retinal membrane; PVD posterior vitreous detachment; RD retinal  detachment; DM diabetes mellitus; DR diabetic retinopathy; NPDR non-proliferative diabetic retinopathy; PDR proliferative diabetic retinopathy; CSME clinically significant macular edema; DME diabetic macular edema; dbh dot blot hemorrhages; CWS cotton wool spot; POAG primary open angle glaucoma; C/D cup-to-disc ratio; HVF humphrey visual field; GVF goldmann visual field; OCT optical coherence tomography; IOP intraocular pressure; BRVO Branch retinal vein occlusion; CRVO central retinal vein occlusion; CRAO central retinal artery occlusion; BRAO branch retinal artery occlusion; RT retinal tear; SB scleral buckle; PPV pars plana vitrectomy; VH Vitreous hemorrhage; PRP panretinal laser photocoagulation; IVK intravitreal kenalog; VMT vitreomacular traction; MH Macular hole;  NVD neovascularization of the disc; NVE neovascularization elsewhere; AREDS age related eye disease study; ARMD age related macular degeneration; POAG primary open angle glaucoma; EBMD epithelial/anterior basement membrane dystrophy; ACIOL anterior chamber intraocular lens; IOL intraocular lens; PCIOL posterior chamber intraocular lens; Phaco/IOL phacoemulsification with intraocular lens placement; PRK photorefractive keratectomy; LASIK laser assisted in situ keratomileusis; HTN hypertension; DM diabetes mellitus; COPD chronic obstructive pulmonary disease

## 2023-12-19 NOTE — Progress Notes (Addendum)
 . [facility] - Clinic Note  12/25/2023    CHIEF COMPLAINT Patient presents for Retina Follow Up  HISTORY OF PRESENT ILLNESS: Megan Schroeder is a 78 y.o. female who presents to the clinic today for:   HPI     Retina Follow Up   Patient presents with  Wet AMD.  In both eyes.  This started 6 weeks ago.  I, the attending physician,  performed the HPI with the patient and updated documentation appropriately.        Comments   Patient here for 6 weeks retina follow up for exu ARMD OU. Patient states vision about the same. Dont see any change. No eye pain.       Last edited by Valdemar Rogue, MD on 12/25/2023  1:21 PM.    Pt states vision is the same   Referring physician: Hyacinth Honey, NP [address] [name],  [place] [address]  HISTORICAL INFORMATION:   Selected notes from the MEDICAL RECORD NUMBER Referred by Dr. Vicci   CURRENT MEDICATIONS: Current Outpatient Medications (Ophthalmic Drugs)  Medication Sig   Bromfenac  Sodium 0.07 % SOLN Place 1 drop into the right eye 4 (four) times daily.   prednisoLONE  acetate (PRED FORTE ) 1 % ophthalmic suspension INSTILL 1 DROP IN THE RIGHT EYE FOUR TIMES DAILY   No current facility-administered medications for this visit. (Ophthalmic Drugs)   Current Outpatient Medications (Other)  Medication Sig   amLODipine  (NORVASC ) 10 MG tablet Take 1 tablet (10 mg total) by mouth daily. For BP   aspirin  EC 81 MG tablet Take 1 tablet (81 mg total) by mouth daily as needed for mild pain (pain score 1-3).   atorvastatin  (LIPITOR) 80 MG tablet Take 1 tablet (80 mg total) by mouth daily.   cholecalciferol  (VITAMIN D ) 1000 UNITS tablet Take 1,000 Units by mouth daily.    Coenzyme Q10 (COQ-10) 200 MG CAPS Take 200 mg by mouth daily.    Continuous Glucose Sensor (FREESTYLE LIBRE 3 PLUS SENSOR) MISC as directed.   dapagliflozin  propanediol (FARXIGA ) 10 MG TABS tablet Take 1 tablet (10 mg total) by mouth daily.    Dulaglutide (TRULICITY) 4.5 MG/0.5ML SOPN Inject 4.5 mg into the skin every Sunday.   ezetimibe (ZETIA) 10 MG tablet Take 10 mg by mouth daily.   GNP ULTICARE PEN NEEDLES 31G X 5 MM MISC See admin instructions.   levothyroxine  (SYNTHROID ) 75 MCG tablet Take 1 tablet (75 mcg total) by mouth daily before breakfast.   TOUJEO  SOLOSTAR 300 UNIT/ML Solostar Pen Inject 10 Units into the skin at bedtime.   No current facility-administered medications for this visit. (Other)   REVIEW OF SYSTEMS: ROS   Positive for: Gastrointestinal, Endocrine, Cardiovascular, Eyes Negative for: Constitutional, Neurological, Skin, Genitourinary, Musculoskeletal, HENT, Respiratory, Psychiatric, Allergic/Imm, Heme/Lymph Last edited by Orval Asberry RAMAN, COA on 12/25/2023  8:11 AM.        ALLERGIES Allergies  Allergen Reactions   Penicillins Hives        Hydrocodone  Nausea And Vomiting   Sulfa Antibiotics Rash   PAST MEDICAL HISTORY Past Medical History:  Diagnosis Date   Anxiety    CAD (coronary artery disease)    Diabetic retinopathy (HCC)    NPDR OU   Hyperlipidemia    Hypertension    Hypertensive retinopathy    OU   Hypothyroidism    Type 2 diabetes mellitus (HCC)    Past Surgical History:  Procedure Laterality Date   BREAST EXCISIONAL BIOPSY  Left    CATARACT EXTRACTION W/PHACO Left 12/16/2017   Procedure: CATARACT EXTRACTION PHACO AND INTRAOCULAR LENS PLACEMENT (IOC);  Surgeon: Perley Hamilton, MD;  Location: [facility];  Service: Ophthalmology;  Laterality: Left;  CDE: 11.65   CATARACT EXTRACTION W/PHACO Right 12/30/2017   Procedure: CATARACT EXTRACTION PHACO AND INTRAOCULAR LENS PLACEMENT RIGHT EYE;  Surgeon: Perley Hamilton, MD;  Location: [facility];  Service: Ophthalmology;  Laterality: Right;  CDE: 9.07   COLONOSCOPY N/A 09/15/2015   Dr. Tyra multiple rectal and colonic polyps removed. Hepatic flexure with 9 mm polyp, multiple 5-7 mm polyps. Multiple cecal polyps with largest 1.5 cm, one 5 mm polyp in  rectum. Path for colonic polyps with sessile serrated polyp without dysplasia, no high grade dysplasia, tubular adenomas, rectal hyperplastic polyp   COLONOSCOPY WITH PROPOFOL  N/A 07/20/2019   multiple tubular adenomas. diverticulosis. 3 year surveillance   COLONOSCOPY WITH PROPOFOL  N/A 02/20/2023   Procedure: COLONOSCOPY WITH PROPOFOL ;  Surgeon: Shaaron Lamar HERO, MD;  Location: [facility];  Service: Endoscopy;  Laterality: N/A;  730am, asa 3   EYE SURGERY Bilateral 2019   Cat Sx - Dr. Hamilton Perley   LEFT HEART CATHETERIZATION WITH CORONARY ANGIOGRAM N/A 07/23/2011   Procedure: LEFT HEART CATHETERIZATION WITH CORONARY ANGIOGRAM;  Surgeon: Maude JAYSON Emmer, MD;  Location: [facility] CATH LAB;  Service: Cardiovascular;  Laterality: N/A;   POLYPECTOMY  07/20/2019   Procedure: POLYPECTOMY;  Surgeon: Shaaron Lamar HERO, MD;  Location: [facility];  Service: Endoscopy;;   POLYPECTOMY  02/20/2023   Procedure: POLYPECTOMY INTESTINAL;  Surgeon: Shaaron Lamar HERO, MD;  Location: [facility];  Service: Endoscopy;;   YAG LASER APPLICATION Right 09/01/2020   Dr. Redell Hans   YAG LASER APPLICATION Left 09/01/2020   Dr. Redell Hans   FAMILY HISTORY Family History  Problem Relation Age of Onset   Cancer Father    Diabetes Father    Colon cancer Sister    SOCIAL HISTORY Social History   Tobacco Use   Smoking status: Never   Smokeless tobacco: Never  Vaping Use   Vaping status: Never Used  Substance Use Topics   Alcohol use: No   Drug use: No       OPHTHALMIC EXAM:  Base Eye Exam     Visual Acuity (Snellen - Linear)       Right Left   Dist cc 20/30 -2 20/20 -2    Correction: Glasses         Tonometry (Tonopen, 8:09 AM)       Right Left   Pressure 19 20         Pupils       Dark Light Shape React APD   Right 3 2 Round Brisk None   Left 3 2 Round Brisk None         Visual Fields (Counting fingers)       Left Right    Full Full         Extraocular Movement       Right  Left    Full, Ortho Full, Ortho         Neuro/Psych     Oriented x3: Yes   Mood/Affect: Normal         Dilation     Both eyes: 1.0% Mydriacyl, 2.5% Phenylephrine  @ 8:09 AM           Slit Lamp and Fundus Exam     Slit Lamp Exam       Right  Left   Lids/Lashes Dermatochalasis - upper lid, mild MGD Dermatochalasis - upper lid, mild MGD   Conjunctiva/Sclera white and quiet white and quiet   Cornea 1-2+PEE, well healed temporal cataract wound 1+PEE, well healed temporal cataract wound   Anterior Chamber deep, clear, narrow temporal angle deep and clear   Iris round and dilated, No NVI round and dilated, No NVI   Lens PC IOL in good position, open PC PC IOL in good position with open PC   Anterior Vitreous syneresis, PVD syneresis, Posterior vitreous detachment, vitreous condensations         Fundus Exam       Right Left   Disc pink and sharp, compact, mild PPA pink and sharp, compact, mild PPP   C/D Ratio 0.3 0.1   Macula flat, blunted foveal reflex, ERM with striae inferiorly, punctate IRH - improved, +central cyst / edema, no exudates, no heme flat, blunted foveal reflex, mild ERM, rare MA, no edema, focal pigment clumping temporal to fovea, no heme   Vessels attenuated, tortuous greatest IT arcades attenuated, tortuous   Periphery attached, rare MA / DBH attached, rare MA/DBH, no RT/RD 360           Refraction     Wearing Rx       Sphere Cylinder Axis Add   Right [place]   +2.50   Left -0.25 +0.50 177 +2.50           IMAGING AND PROCEDURES  Imaging and Procedures for 12/25/2023  OCT, Retina - OU - Both Eyes       Right Eye Quality was good. Central Foveal Thickness: 483. Progression has been stable. Findings include no SRF, abnormal foveal contour, epiretinal membrane, intraretinal fluid, macular pucker (ERM with pucker -- stable; mild persistent IRF and central cyst -- stable).   Left Eye Quality was good. Central Foveal Thickness: 259.  Progression has been stable. Findings include normal foveal contour, no IRF, no SRF, epiretinal membrane, macular pucker (ERM with early pucker, partial PVD).   Notes *Images captured and stored on drive  Diagnosis / Impression:  OD: ERM with pucker -- stable; mild persistent IRF and central cyst  OS: ERM with early pucker, partial PVD  Clinical management:  See below  Abbreviations: NFP - Normal foveal profile. CME - cystoid macular edema. PED - pigment epithelial detachment. IRF - intraretinal fluid. SRF - subretinal fluid. EZ - ellipsoid zone. ERM - epiretinal membrane. ORA - outer retinal atrophy. ORT - outer retinal tubulation. SRHM - subretinal hyper-reflective material. IRHM - intraretinal hyper-reflective material      Intravitreal Injection, Pharmacologic Agent - OD - Right Eye       Time Out 12/25/2023. 8:52 AM. Confirmed correct patient, procedure, site, and patient consented.   Anesthesia Topical anesthesia was used. Anesthetic medications included Lidocaine  2%, Proparacaine 0.5%.   Procedure Preparation included 5% betadine  to ocular surface, eyelid speculum. A (32g) needle was used.   Injection: 2 mg aflibercept  2 MG/0.05ML   Route: Intravitreal, Site: Right Eye   NDC: [id number], Lot: [id number], Expiration date: 04/10/2025, Waste: 0 mL   Post-op Post injection exam found visual acuity of at least counting fingers. The patient tolerated the procedure well. There were no complications. The patient received written and verbal post procedure care education. Post injection medications were not given.            ASSESSMENT/PLAN:    ICD-10-CM   1. Moderate nonproliferative diabetic retinopathy of both eyes with macular edema  associated with type 2 diabetes mellitus (HCC)  E11.3313 OCT, Retina - OU - Both Eyes    Intravitreal Injection, Pharmacologic Agent - OD - Right Eye    aflibercept  (EYLEA ) SOLN 2 mg    2. Current use of insulin  (HCC)  Z79.4     3.  Long term (current) use of oral hypoglycemic drugs  Z79.84     4. Long-term (current) use of injectable non-insulin  antidiabetic drugs  Z79.85     5. Epiretinal membrane (ERM) of both eyes  H35.373     6. Essential hypertension  I10     7. Hypertensive retinopathy of both eyes  H35.033     8. Pseudophakia of both eyes  Z96.1      1-4. Moderate nonproliferative diabetic retinopathy OU - s/p IVA OD #1 (02.16.23), #2 (03.20.23), #3 (04.17.23), #4 (05.22.23), #5 (06.22.23), #6 (07.27.23),  #7 (09.01.23) #8 (10.06.23), #9 (11.09.23), #10 (12.15.23) #11 (01.12.24), #12 (02.09.24) #13 (03.15.24) - s/p IVE OD #1 (04.18.24), #2 (05.24.24), #3 (07.01.24), #4 (08.12.24), #5 (09.25.24), #6 (11.06.24), #7 (12.18.24), #8 (01.29.25), #9 (03.12.25), #10 (04.23.25), #11 (06.04.25) - BCVA OD: stable at 20/30, OS: stable at 20/20 20/25  - exam shows scattered MA, no NV - OCT shows OD: ERM with pucker stable; mild persistent IRF and central cyst; OS: ERM with early pucker, partial PVD at 6 weeks - recommend IVE OD #12 today, 07.16.25 for DME component w/ f/u extended to 6-7 wks - pt wishes to proceed - RBA of procedure discussed, questions answered - IVE informed consent obtained and signed, 04.19.24 (OD)  - have obtained Eylea  approval for 2025 - [employer] funding unavailable -- pt covering 20% coinsurance - see procedure note  - f/u in 6-7 wks - DFE/OCT, possible injxn  5. Epiretinal membrane OU -- stable  - BCVA OD 20/30; OS 20/20 - OCT shows OD: ERM with pucker and prominent central cyst; persistent IRF and central cyst; OS: mild ERM with early pucker, partial PVD    - IRF OD: ?DME vs CME vs cystic changes - started PF and Prolensa  QID OD only for possible CME component on 12.8.22 -- continue, but suspect DME may be significant component  - no metamorphopsia  - monitor for now  - f/u 6 weeks -- DFE/OCT  6,7. Hypertensive retinopathy OU - discussed importance of tight BP control -  monitor   8. Pseudophakia OU  - s/p CE/IOL OU, 2019 (Dr. Perley)  - IOLs in good position, doing well  - s/p YAG cap OD 3.24.22  - s/p YAG cap OS 04.07.22  - monitor  Ophthalmic Meds Ordered this visit:  Meds ordered this encounter  Medications   aflibercept  (EYLEA ) SOLN 2 mg     Return for 6-7 wks - f/u NPDR OU, DFE, OCT, Possible Injxn.  There are no Patient Instructions on file for this visit.  This document serves as a record of services personally performed by Redell JUDITHANN Hans, MD, PhD. It was created on their behalf by Almetta Pesa, an ophthalmic technician. The creation of this record is the provider's dictation and/or activities during the visit.    Electronically signed by: Almetta Pesa, OA, 12/29/23  1:54 AM  This document serves as a record of services personally performed by Redell JUDITHANN Hans, MD, PhD. It was created on their behalf by Alan PARAS. Delores, OA an ophthalmic technician. The creation of this record is the provider's dictation and/or activities during the visit.    Electronically signed by: Alan  DOROTHA Daring, OA 12/29/23 1:54 AM  Redell JUDITHANN Hans, M.D., Ph.D. Diseases & Surgery of the Retina and Vitreous [facility] 12/25/2023   I have reviewed the above documentation for accuracy and completeness, and I agree with the above. Redell JUDITHANN Hans, M.D., Ph.D. 12/29/23 1:54 AM   Abbreviations: M myopia (nearsighted); A astigmatism; H hyperopia (farsighted); P presbyopia; Mrx spectacle prescription;  CTL contact lenses; OD right eye; OS left eye; OU both eyes  XT exotropia; ET esotropia; PEK punctate epithelial keratitis; PEE punctate epithelial erosions; DES dry eye syndrome; MGD meibomian gland dysfunction; ATs artificial tears; PFAT's preservative free artificial tears; NSC nuclear sclerotic cataract; PSC posterior subcapsular cataract; ERM epi-retinal membrane; PVD posterior vitreous detachment; RD retinal detachment; DM diabetes mellitus;  DR diabetic retinopathy; NPDR non-proliferative diabetic retinopathy; PDR proliferative diabetic retinopathy; CSME clinically significant macular edema; DME diabetic macular edema; dbh dot blot hemorrhages; CWS cotton wool spot; POAG primary open angle glaucoma; C/D cup-to-disc ratio; HVF humphrey visual field; GVF goldmann visual field; OCT optical coherence tomography; IOP intraocular pressure; BRVO Branch retinal vein occlusion; CRVO central retinal vein occlusion; CRAO central retinal artery occlusion; BRAO branch retinal artery occlusion; RT retinal tear; SB scleral buckle; PPV pars plana vitrectomy; VH Vitreous hemorrhage; PRP panretinal laser photocoagulation; IVK intravitreal kenalog; VMT vitreomacular traction; MH Macular hole;  NVD neovascularization of the disc; NVE neovascularization elsewhere; AREDS age related eye disease study; ARMD age related macular degeneration; POAG primary open angle glaucoma; EBMD epithelial/anterior basement membrane dystrophy; ACIOL anterior chamber intraocular lens; IOL intraocular lens; PCIOL posterior chamber intraocular lens; Phaco/IOL phacoemulsification with intraocular lens placement; PRK photorefractive keratectomy; LASIK laser assisted in situ keratomileusis; HTN hypertension; DM diabetes mellitus; COPD chronic obstructive pulmonary disease

## 2023-12-25 ENCOUNTER — Encounter (INDEPENDENT_AMBULATORY_CARE_PROVIDER_SITE_OTHER): Payer: Self-pay | Admitting: Ophthalmology

## 2023-12-25 ENCOUNTER — Ambulatory Visit (INDEPENDENT_AMBULATORY_CARE_PROVIDER_SITE_OTHER): Admitting: Ophthalmology

## 2023-12-25 DIAGNOSIS — Z961 Presence of intraocular lens: Secondary | ICD-10-CM

## 2023-12-25 DIAGNOSIS — E113313 Type 2 diabetes mellitus with moderate nonproliferative diabetic retinopathy with macular edema, bilateral: Secondary | ICD-10-CM | POA: Diagnosis not present

## 2023-12-25 DIAGNOSIS — H35373 Puckering of macula, bilateral: Secondary | ICD-10-CM | POA: Diagnosis not present

## 2023-12-25 DIAGNOSIS — Z794 Long term (current) use of insulin: Secondary | ICD-10-CM | POA: Diagnosis not present

## 2023-12-25 DIAGNOSIS — Z7985 Long-term (current) use of injectable non-insulin antidiabetic drugs: Secondary | ICD-10-CM | POA: Diagnosis not present

## 2023-12-25 DIAGNOSIS — Z7984 Long term (current) use of oral hypoglycemic drugs: Secondary | ICD-10-CM

## 2023-12-25 DIAGNOSIS — H35033 Hypertensive retinopathy, bilateral: Secondary | ICD-10-CM | POA: Diagnosis not present

## 2023-12-25 DIAGNOSIS — I10 Essential (primary) hypertension: Secondary | ICD-10-CM

## 2023-12-25 MED ORDER — AFLIBERCEPT 2MG/0.05ML IZ SOLN FOR KALEIDOSCOPE
2.0000 mg | INTRAVITREAL | Status: AC | PRN
Start: 2023-12-25 — End: 2023-12-25
  Administered 2023-12-25: 2 mg via INTRAVITREAL

## 2024-01-01 DIAGNOSIS — W57XXXA Bitten or stung by nonvenomous insect and other nonvenomous arthropods, initial encounter: Secondary | ICD-10-CM | POA: Diagnosis not present

## 2024-01-01 DIAGNOSIS — E1165 Type 2 diabetes mellitus with hyperglycemia: Secondary | ICD-10-CM | POA: Diagnosis not present

## 2024-01-01 DIAGNOSIS — E113313 Type 2 diabetes mellitus with moderate nonproliferative diabetic retinopathy with macular edema, bilateral: Secondary | ICD-10-CM | POA: Diagnosis not present

## 2024-01-01 DIAGNOSIS — S70361A Insect bite (nonvenomous), right thigh, initial encounter: Secondary | ICD-10-CM | POA: Diagnosis not present

## 2024-01-01 DIAGNOSIS — I10 Essential (primary) hypertension: Secondary | ICD-10-CM | POA: Diagnosis not present

## 2024-01-14 DIAGNOSIS — B351 Tinea unguium: Secondary | ICD-10-CM | POA: Diagnosis not present

## 2024-01-14 DIAGNOSIS — M79675 Pain in left toe(s): Secondary | ICD-10-CM | POA: Diagnosis not present

## 2024-01-14 DIAGNOSIS — E1142 Type 2 diabetes mellitus with diabetic polyneuropathy: Secondary | ICD-10-CM | POA: Diagnosis not present

## 2024-01-14 DIAGNOSIS — M79674 Pain in right toe(s): Secondary | ICD-10-CM | POA: Diagnosis not present

## 2024-01-14 DIAGNOSIS — L84 Corns and callosities: Secondary | ICD-10-CM | POA: Diagnosis not present

## 2024-01-30 NOTE — Progress Notes (Signed)
 . [facility] - Clinic Note  02/05/2024    CHIEF COMPLAINT Patient presents for Retina Follow Up  HISTORY OF PRESENT ILLNESS: Megan Schroeder is a 78 y.o. female who presents to the clinic today for:   HPI     Retina Follow Up   Patient presents with  Diabetic Retinopathy.  In both eyes.  Severity is moderate.  Duration of 6 weeks.  Since onset it is stable.  I, the attending physician,  performed the HPI with the patient and updated documentation appropriately.        Comments   Pt here for 6 wk ret f/u NPDR OU. Pt states no VA changes. New A1C is 7.1, f/u in 2 mo. Pt on Pred and Prolensa  TID OD.       Last edited by Valdemar Rogue, MD on 02/05/2024  6:46 PM.      Referring physician: Hyacinth Honey, NP [address] [place] ,  [place] [address]  HISTORICAL INFORMATION:   Selected notes from the MEDICAL RECORD NUMBER Referred by Dr. Vicci   CURRENT MEDICATIONS: Current Outpatient Medications (Ophthalmic Drugs)  Medication Sig   Bromfenac  Sodium 0.07 % SOLN Place 1 drop into the right eye 4 (four) times daily.   prednisoLONE  acetate (PRED FORTE ) 1 % ophthalmic suspension INSTILL 1 DROP IN THE RIGHT EYE FOUR TIMES DAILY   No current facility-administered medications for this visit. (Ophthalmic Drugs)   Current Outpatient Medications (Other)  Medication Sig   amLODipine  (NORVASC ) 10 MG tablet Take 1 tablet (10 mg total) by mouth daily. For BP   aspirin  EC 81 MG tablet Take 1 tablet (81 mg total) by mouth daily as needed for mild pain (pain score 1-3).   atorvastatin  (LIPITOR) 80 MG tablet Take 1 tablet (80 mg total) by mouth daily.   cholecalciferol  (VITAMIN D ) 1000 UNITS tablet Take 1,000 Units by mouth daily.    Coenzyme Q10 (COQ-10) 200 MG CAPS Take 200 mg by mouth daily.    Continuous Glucose Sensor (FREESTYLE LIBRE 3 PLUS SENSOR) MISC as directed.   dapagliflozin  propanediol (FARXIGA ) 10 MG TABS tablet Take 1 tablet (10 mg total) by mouth  daily.   Dulaglutide (TRULICITY) 4.5 MG/0.5ML SOPN Inject 4.5 mg into the skin every Sunday.   ezetimibe (ZETIA) 10 MG tablet Take 10 mg by mouth daily.   GNP ULTICARE PEN NEEDLES 31G X 5 MM MISC See admin instructions.   levothyroxine  (SYNTHROID ) 75 MCG tablet Take 1 tablet (75 mcg total) by mouth daily before breakfast.   TOUJEO  SOLOSTAR 300 UNIT/ML Solostar Pen Inject 10 Units into the skin at bedtime.   No current facility-administered medications for this visit. (Other)   REVIEW OF SYSTEMS: ROS   Positive for: Gastrointestinal, Endocrine, Cardiovascular, Eyes Negative for: Constitutional, Neurological, Skin, Genitourinary, Musculoskeletal, HENT, Respiratory, Psychiatric, Allergic/Imm, Heme/Lymph Last edited by Antonetta Almetta BRAVO, COT on 02/05/2024  7:59 AM.         ALLERGIES Allergies  Allergen Reactions   Penicillins Hives        Hydrocodone  Nausea And Vomiting   Sulfa Antibiotics Rash   PAST MEDICAL HISTORY Past Medical History:  Diagnosis Date   Anxiety    CAD (coronary artery disease)    Diabetic retinopathy (HCC)    NPDR OU   Hyperlipidemia    Hypertension    Hypertensive retinopathy    OU   Hypothyroidism    Type 2 diabetes mellitus (HCC)    Past Surgical History:  Procedure Laterality Date   BREAST EXCISIONAL BIOPSY Left    CATARACT EXTRACTION W/PHACO Left 12/16/2017   Procedure: CATARACT EXTRACTION PHACO AND INTRAOCULAR LENS PLACEMENT (IOC);  Surgeon: Perley Hamilton, MD;  Location: [facility];  Service: Ophthalmology;  Laterality: Left;  CDE: 11.65   CATARACT EXTRACTION W/PHACO Right 12/30/2017   Procedure: CATARACT EXTRACTION PHACO AND INTRAOCULAR LENS PLACEMENT RIGHT EYE;  Surgeon: Perley Hamilton, MD;  Location: [facility];  Service: Ophthalmology;  Laterality: Right;  CDE: 9.07   COLONOSCOPY N/A 09/15/2015   Dr. Tyra multiple rectal and colonic polyps removed. Hepatic flexure with 9 mm polyp, multiple 5-7 mm polyps. Multiple cecal polyps with largest 1.5 cm, one 5  mm polyp in rectum. Path for colonic polyps with sessile serrated polyp without dysplasia, no high grade dysplasia, tubular adenomas, rectal hyperplastic polyp   COLONOSCOPY WITH PROPOFOL  N/A 07/20/2019   multiple tubular adenomas. diverticulosis. 3 year surveillance   COLONOSCOPY WITH PROPOFOL  N/A 02/20/2023   Procedure: COLONOSCOPY WITH PROPOFOL ;  Surgeon: Shaaron Lamar HERO, MD;  Location: [facility];  Service: Endoscopy;  Laterality: N/A;  730am, asa 3   EYE SURGERY Bilateral 2019   Cat Sx - Dr. Hamilton Perley   LEFT HEART CATHETERIZATION WITH CORONARY ANGIOGRAM N/A 07/23/2011   Procedure: LEFT HEART CATHETERIZATION WITH CORONARY ANGIOGRAM;  Surgeon: Maude JAYSON Emmer, MD;  Location: [facility] C[facility];  Service: Cardiovascular;  Laterality: N/A;   POLYPECTOMY  07/20/2019   Procedure: POLYPECTOMY;  Surgeon: Shaaron Lamar HERO, MD;  Location: [facility];  Service: Endoscopy;;   POLYPECTOMY  02/20/2023   Procedure: POLYPECTOMY INTESTINAL;  Surgeon: Shaaron Lamar HERO, MD;  Location: [facility];  Service: Endoscopy;;   YAG LASER APPLICATION Right 09/01/2020   Dr. Redell Hans   YAG LASER APPLICATION Left 09/01/2020   Dr. Redell Hans   FAMILY HISTORY Family History  Problem Relation Age of Onset   Cancer Father    Diabetes Father    Colon cancer Sister    SOCIAL HISTORY Social History   Tobacco Use   Smoking status: Never   Smokeless tobacco: Never  Vaping Use   Vaping status: Never Used  Substance Use Topics   Alcohol use: No   Drug use: No       OPHTHALMIC EXAM:  Base Eye Exam     Visual Acuity (Snellen - Linear)       Right Left   Dist cc 20/30 -2 20/20 -1   Dist ph cc NI     Correction: Glasses         Tonometry (Tonopen, 8:04 AM)       Right Left   Pressure 18 20         Pupils       Pupils Dark Light Shape React APD   Right PERRL 3 2 Round Brisk None   Left PERRL 3 2 Round Brisk None         Visual Fields (Counting fingers)       Left Right    Full  Full         Extraocular Movement       Right Left    Full, Ortho Full, Ortho         Neuro/Psych     Oriented x3: Yes   Mood/Affect: Normal         Dilation     Both eyes: 1.0% Mydriacyl, 2.5% Phenylephrine  @ 8:05 AM           Slit  Lamp and Fundus Exam     Slit Lamp Exam       Right Left   Lids/Lashes Dermatochalasis - upper lid, mild MGD Dermatochalasis - upper lid, mild MGD   Conjunctiva/Sclera white and quiet white and quiet   Cornea 1-2+PEE, well healed temporal cataract wound 1+PEE, well healed temporal cataract wound   Anterior Chamber deep, clear, narrow temporal angle deep and clear   Iris round and dilated, No NVI round and dilated, No NVI   Lens PC IOL in good position, open PC PC IOL in good position with open PC   Anterior Vitreous syneresis, PVD syneresis, Posterior vitreous detachment, vitreous condensations         Fundus Exam       Right Left   Disc pink and sharp, compact, mild PPA pink and sharp, compact, mild PPP   C/D Ratio 0.3 0.1   Macula flat, blunted foveal reflex, ERM with striae inferiorly, punctate IRH - improved, +central cyst / edema, no exudates, no heme flat, blunted foveal reflex, mild ERM, rare MA, no edema, focal pigment clumping temporal to fovea, no heme   Vessels attenuated, tortuous attenuated, tortuous   Periphery attached, rare MA / DBH attached, rare MA/DBH, no RT/RD 360           Refraction     Wearing Rx       Sphere Cylinder Axis Add   Right [place]   +2.50   Left -0.25 +0.50 177 +2.50           IMAGING AND PROCEDURES  Imaging and Procedures for 02/05/2024  OCT, Retina - OU - Both Eyes       Right Eye Quality was good. Central Foveal Thickness: 484. Progression has been stable. Findings include no SRF, abnormal foveal contour, epiretinal membrane, intraretinal fluid, macular pucker (ERM with pucker -- stable; mild persistent IRF and central cyst -- stable).   Left Eye Quality was good. Central  Foveal Thickness: 256. Progression has been stable. Findings include normal foveal contour, no IRF, no SRF, epiretinal membrane, macular pucker (ERM with early pucker, partial PVD).   Notes *Images captured and stored on drive  Diagnosis / Impression:  OD: ERM with pucker -- stable; mild persistent IRF and central cyst  OS: ERM with early pucker, partial PVD  Clinical management:  See below  Abbreviations: NFP - Normal foveal profile. CME - cystoid macular edema. PED - pigment epithelial detachment. IRF - intraretinal fluid. SRF - subretinal fluid. EZ - ellipsoid zone. ERM - epiretinal membrane. ORA - outer retinal atrophy. ORT - outer retinal tubulation. SRHM - subretinal hyper-reflective material. IRHM - intraretinal hyper-reflective material      Intravitreal Injection, Pharmacologic Agent - OD - Right Eye       Time Out 02/05/2024. 8:17 AM. Confirmed correct patient, procedure, site, and patient consented.   Anesthesia Topical anesthesia was used. Anesthetic medications included Lidocaine  2%, Proparacaine 0.5%.   Procedure Preparation included 5% betadine  to ocular surface, eyelid speculum. A (32g) needle was used.   Injection: 2 mg aflibercept  2 MG/0.05ML   Route: Intravitreal, Site: Right Eye   NDC: [id number], Lot: [id number], Expiration date: 04/10/2025, Waste: 0 mL   Post-op Post injection exam found visual acuity of at least counting fingers. The patient tolerated the procedure well. There were no complications. The patient received written and verbal post procedure care education. Post injection medications were not given.             ASSESSMENT/PLAN:  ICD-10-CM   1. Moderate nonproliferative diabetic retinopathy of both eyes with macular edema associated with type 2 diabetes mellitus (HCC)  E11.3313 OCT, Retina - OU - Both Eyes    Intravitreal Injection, Pharmacologic Agent - OD - Right Eye    aflibercept  (EYLEA ) SOLN 2 mg    2. Current use of insulin   (HCC)  Z79.4     3. Long term (current) use of oral hypoglycemic drugs  Z79.84     4. Long-term (current) use of injectable non-insulin  antidiabetic drugs  Z79.85     5. Epiretinal membrane (ERM) of both eyes  H35.373     6. Essential hypertension  I10     7. Hypertensive retinopathy of both eyes  H35.033     8. Pseudophakia of both eyes  Z96.1       1-4. Moderate nonproliferative diabetic retinopathy OU - s/p IVA OD #1 (02.16.23), #2 (03.20.23), #3 (04.17.23), #4 (05.22.23), #5 (06.22.23), #6 (07.27.23),  #7 (09.01.23) #8 (10.06.23), #9 (11.09.23), #10 (12.15.23) #11 (01.12.24), #12 (02.09.24) #13 (03.15.24) - s/p IVE OD #1 (04.18.24), #2 (05.24.24), #3 (07.01.24), #4 (08.12.24), #5 (09.25.24), #6 (11.06.24), #7 (12.18.24), #8 (01.29.25), #9 (03.12.25), #10 (04.23.25), #11 (06.04.25), #12 (07.16.25) - A1C 7.1 (July 2025), 7.8 (06/25/23) - BCVA OD: stable at 20/30, OS: stable at 20/20 - exam shows scattered MA, no NV - OCT shows OD: ERM with pucker stable; mild persistent IRF and central cyst; OS: ERM with early pucker, partial PVD at 6 weeks - recommend IVE OD #13 today, 08.27.25 for DME component w/ f/u extended to 6-7 wks - pt wishes to proceed - RBA of procedure discussed, questions answered - IVE informed consent obtained and signed, 08.24.25 (OD)  - have obtained Eylea  approval for 2025 - [employer] funding unavailable -- pt covering 20% coinsurance - see procedure note  - f/u in 6-7 wks - DFE/OCT, possible injxn  5. Epiretinal membrane OU -- stable  - BCVA OD 20/30; OS 20/20 - OCT shows OD: ERM with pucker and prominent central cyst; persistent IRF and central cyst; OS: mild ERM with early pucker, partial PVD    - IRF OD: ?DME vs CME vs cystic changes - started PF and Prolensa  QID OD only for possible CME component on 12.8.22 -- continue, but suspect DME may be significant component  - no metamorphopsia  - monitor for now  - f/u 6 weeks -- DFE/OCT  6,7. Hypertensive  retinopathy OU - discussed importance of tight BP control - monitor   8. Pseudophakia OU  - s/p CE/IOL OU, 2019 (Dr. Perley)  - IOLs in good position, doing well  - s/p YAG cap OD 3.24.22  - s/p YAG cap OS 04.07.22  - monitor  Ophthalmic Meds Ordered this visit:  Meds ordered this encounter  Medications   aflibercept  (EYLEA ) SOLN 2 mg     Return in about 7 weeks (around 03/25/2024) for f/u Mod NPDR OU , DFE, OCT, Possible, IVE, OD.  There are no Patient Instructions on file for this visit.  This document serves as a record of services personally performed by Redell JUDITHANN Hans, MD, PhD. It was created on their behalf by Almetta Pesa, an ophthalmic technician. The creation of this record is the provider's dictation and/or activities during the visit.    Electronically signed by: Almetta Pesa, OA, 02/05/24  10:28 PM  This document serves as a record of services personally performed by Redell JUDITHANN Hans, MD, PhD. It was created on their behalf by  Jacobi GEANNIE Keens, COT an ophthalmic technician. The creation of this record is the provider's dictation and/or activities during the visit.    Electronically signed by:  Zahira GEANNIE Keens, COT  02/05/24 10:28 PM  Redell JUDITHANN Hans, M.D., Ph.D. Diseases & Surgery of the Retina and Vitreous [facility] 12/25/2023   I have reviewed the above documentation for accuracy and completeness, and I agree with the above. Redell JUDITHANN Hans, M.D., Ph.D. 02/05/24 10:29 PM    Abbreviations: M myopia (nearsighted); A astigmatism; H hyperopia (farsighted); P presbyopia; Mrx spectacle prescription;  CTL contact lenses; OD right eye; OS left eye; OU both eyes  XT exotropia; ET esotropia; PEK punctate epithelial keratitis; PEE punctate epithelial erosions; DES dry eye syndrome; MGD meibomian gland dysfunction; ATs artificial tears; PFAT's preservative free artificial tears; NSC nuclear sclerotic cataract; PSC posterior subcapsular  cataract; ERM epi-retinal membrane; PVD posterior vitreous detachment; RD retinal detachment; DM diabetes mellitus; DR diabetic retinopathy; NPDR non-proliferative diabetic retinopathy; PDR proliferative diabetic retinopathy; CSME clinically significant macular edema; DME diabetic macular edema; dbh dot blot hemorrhages; CWS cotton wool spot; POAG primary open angle glaucoma; C/D cup-to-disc ratio; HVF humphrey visual field; GVF goldmann visual field; OCT optical coherence tomography; IOP intraocular pressure; BRVO Branch retinal vein occlusion; CRVO central retinal vein occlusion; CRAO central retinal artery occlusion; BRAO branch retinal artery occlusion; RT retinal tear; SB scleral buckle; PPV pars plana vitrectomy; VH Vitreous hemorrhage; PRP panretinal laser photocoagulation; IVK intravitreal kenalog; VMT vitreomacular traction; MH Macular hole;  NVD neovascularization of the disc; NVE neovascularization elsewhere; AREDS age related eye disease study; ARMD age related macular degeneration; POAG primary open angle glaucoma; EBMD epithelial/anterior basement membrane dystrophy; ACIOL anterior chamber intraocular lens; IOL intraocular lens; PCIOL posterior chamber intraocular lens; Phaco/IOL phacoemulsification with intraocular lens placement; PRK photorefractive keratectomy; LASIK laser assisted in situ keratomileusis; HTN hypertension; DM diabetes mellitus; COPD chronic obstructive pulmonary disease

## 2024-02-05 ENCOUNTER — Encounter (INDEPENDENT_AMBULATORY_CARE_PROVIDER_SITE_OTHER): Payer: Self-pay | Admitting: Ophthalmology

## 2024-02-05 ENCOUNTER — Ambulatory Visit (INDEPENDENT_AMBULATORY_CARE_PROVIDER_SITE_OTHER): Admitting: Ophthalmology

## 2024-02-05 DIAGNOSIS — Z7985 Long-term (current) use of injectable non-insulin antidiabetic drugs: Secondary | ICD-10-CM | POA: Diagnosis not present

## 2024-02-05 DIAGNOSIS — H35373 Puckering of macula, bilateral: Secondary | ICD-10-CM

## 2024-02-05 DIAGNOSIS — H35033 Hypertensive retinopathy, bilateral: Secondary | ICD-10-CM

## 2024-02-05 DIAGNOSIS — I10 Essential (primary) hypertension: Secondary | ICD-10-CM

## 2024-02-05 DIAGNOSIS — E113313 Type 2 diabetes mellitus with moderate nonproliferative diabetic retinopathy with macular edema, bilateral: Secondary | ICD-10-CM

## 2024-02-05 DIAGNOSIS — Z794 Long term (current) use of insulin: Secondary | ICD-10-CM

## 2024-02-05 DIAGNOSIS — Z7984 Long term (current) use of oral hypoglycemic drugs: Secondary | ICD-10-CM

## 2024-02-05 DIAGNOSIS — Z961 Presence of intraocular lens: Secondary | ICD-10-CM

## 2024-02-05 MED ORDER — AFLIBERCEPT 2MG/0.05ML IZ SOLN FOR KALEIDOSCOPE
2.0000 mg | INTRAVITREAL | Status: AC | PRN
  Administered 2024-02-05: 2 mg via INTRAVITREAL

## 2024-02-25 ENCOUNTER — Other Ambulatory Visit (INDEPENDENT_AMBULATORY_CARE_PROVIDER_SITE_OTHER): Payer: Self-pay | Admitting: Ophthalmology

## 2024-03-10 DIAGNOSIS — E1165 Type 2 diabetes mellitus with hyperglycemia: Secondary | ICD-10-CM | POA: Diagnosis not present

## 2024-03-10 DIAGNOSIS — E039 Hypothyroidism, unspecified: Secondary | ICD-10-CM | POA: Diagnosis not present

## 2024-03-10 DIAGNOSIS — E785 Hyperlipidemia, unspecified: Secondary | ICD-10-CM | POA: Diagnosis not present

## 2024-03-10 LAB — HEMOGLOBIN A1C: Hemoglobin A1C: 9

## 2024-03-16 DIAGNOSIS — I10 Essential (primary) hypertension: Secondary | ICD-10-CM | POA: Diagnosis not present

## 2024-03-16 DIAGNOSIS — Z23 Encounter for immunization: Secondary | ICD-10-CM | POA: Diagnosis not present

## 2024-03-16 DIAGNOSIS — Z79899 Other long term (current) drug therapy: Secondary | ICD-10-CM | POA: Diagnosis not present

## 2024-03-16 DIAGNOSIS — E039 Hypothyroidism, unspecified: Secondary | ICD-10-CM | POA: Diagnosis not present

## 2024-03-16 DIAGNOSIS — Z0000 Encounter for general adult medical examination without abnormal findings: Secondary | ICD-10-CM | POA: Diagnosis not present

## 2024-03-16 DIAGNOSIS — E785 Hyperlipidemia, unspecified: Secondary | ICD-10-CM | POA: Diagnosis not present

## 2024-03-16 DIAGNOSIS — I700 Atherosclerosis of aorta: Secondary | ICD-10-CM | POA: Diagnosis not present

## 2024-03-16 DIAGNOSIS — Z5320 Procedure and treatment not carried out because of patient's decision for unspecified reasons: Secondary | ICD-10-CM | POA: Diagnosis not present

## 2024-03-16 DIAGNOSIS — E1165 Type 2 diabetes mellitus with hyperglycemia: Secondary | ICD-10-CM | POA: Diagnosis not present

## 2024-03-16 DIAGNOSIS — Z7984 Long term (current) use of oral hypoglycemic drugs: Secondary | ICD-10-CM | POA: Diagnosis not present

## 2024-03-16 DIAGNOSIS — E113313 Type 2 diabetes mellitus with moderate nonproliferative diabetic retinopathy with macular edema, bilateral: Secondary | ICD-10-CM | POA: Diagnosis not present

## 2024-03-16 DIAGNOSIS — R809 Proteinuria, unspecified: Secondary | ICD-10-CM | POA: Diagnosis not present

## 2024-03-17 NOTE — Progress Notes (Signed)
 . [facility] - Clinic Note  03/25/2024    CHIEF COMPLAINT Patient presents for Retina Follow Up  HISTORY OF PRESENT ILLNESS: Megan Schroeder is a 78 y.o. female who presents to the clinic today for:   HPI     Retina Follow Up   Patient presents with  Diabetic Retinopathy.  In both eyes.  This started 7 weeks ago.        Comments   Patient here for 7 weeks retina follow up for NPDR OU. Patient states vision about the same. No difference.       Last edited by Orval Asberry RAMAN, COA on 03/25/2024  8:08 AM.     Pt doing well no changes in vision.   Referring physician: Hyacinth Honey, NP [address] Jewell [place],  [place] [address]  HISTORICAL INFORMATION:   Selected notes from the MEDICAL RECORD NUMBER Referred by Dr. Vicci   CURRENT MEDICATIONS: Current Outpatient Medications (Ophthalmic Drugs)  Medication Sig   Bromfenac  Sodium 0.07 % SOLN Place 1 drop into the right eye 4 (four) times daily.   prednisoLONE  acetate (PRED FORTE ) 1 % ophthalmic suspension INSTILL 1 DROP INTO RIGHT EYE 4 TIMES A DAY   No current facility-administered medications for this visit. (Ophthalmic Drugs)   Current Outpatient Medications (Other)  Medication Sig   amLODipine  (NORVASC ) 10 MG tablet Take 1 tablet (10 mg total) by mouth daily. For BP   aspirin  EC 81 MG tablet Take 1 tablet (81 mg total) by mouth daily as needed for mild pain (pain score 1-3).   atorvastatin  (LIPITOR) 80 MG tablet Take 1 tablet (80 mg total) by mouth daily.   cholecalciferol  (VITAMIN D ) 1000 UNITS tablet Take 1,000 Units by mouth daily.    Coenzyme Q10 (COQ-10) 200 MG CAPS Take 200 mg by mouth daily.    Continuous Glucose Sensor (FREESTYLE LIBRE 3 PLUS SENSOR) MISC as directed.   dapagliflozin  propanediol (FARXIGA ) 10 MG TABS tablet Take 1 tablet (10 mg total) by mouth daily.   Dulaglutide (TRULICITY) 4.5 MG/0.5ML SOPN Inject 4.5 mg into the skin every Sunday.   ezetimibe (ZETIA) 10 MG  tablet Take 10 mg by mouth daily.   GNP ULTICARE PEN NEEDLES 31G X 5 MM MISC See admin instructions.   levothyroxine  (SYNTHROID ) 75 MCG tablet Take 1 tablet (75 mcg total) by mouth daily before breakfast.   TOUJEO  SOLOSTAR 300 UNIT/ML Solostar Pen Inject 10 Units into the skin at bedtime.   No current facility-administered medications for this visit. (Other)   REVIEW OF SYSTEMS: ROS   Positive for: Gastrointestinal, Endocrine, Cardiovascular, Eyes Negative for: Constitutional, Neurological, Skin, Genitourinary, Musculoskeletal, HENT, Respiratory, Psychiatric, Allergic/Imm, Heme/Lymph Last edited by Orval Asberry RAMAN, COA on 03/25/2024  8:08 AM.          ALLERGIES Allergies  Allergen Reactions   Penicillins Hives        Hydrocodone  Nausea And Vomiting   Sulfa Antibiotics Rash   PAST MEDICAL HISTORY Past Medical History:  Diagnosis Date   Anxiety    CAD (coronary artery disease)    Diabetic retinopathy (HCC)    NPDR OU   Hyperlipidemia    Hypertension    Hypertensive retinopathy    OU   Hypothyroidism    Type 2 diabetes mellitus (HCC)    Past Surgical History:  Procedure Laterality Date   BREAST EXCISIONAL BIOPSY Left    CATARACT EXTRACTION W/PHACO Left 12/16/2017   Procedure: CATARACT EXTRACTION PHACO AND INTRAOCULAR  LENS PLACEMENT (IOC);  Surgeon: Perley Hamilton, MD;  Location: [facility];  Service: Ophthalmology;  Laterality: Left;  CDE: 11.65   CATARACT EXTRACTION W/PHACO Right 12/30/2017   Procedure: CATARACT EXTRACTION PHACO AND INTRAOCULAR LENS PLACEMENT RIGHT EYE;  Surgeon: Perley Hamilton, MD;  Location: [facility];  Service: Ophthalmology;  Laterality: Right;  CDE: 9.07   COLONOSCOPY N/A 09/15/2015   Dr. Tyra multiple rectal and colonic polyps removed. Hepatic flexure with 9 mm polyp, multiple 5-7 mm polyps. Multiple cecal polyps with largest 1.5 cm, one 5 mm polyp in rectum. Path for colonic polyps with sessile serrated polyp without dysplasia, no high grade dysplasia,  tubular adenomas, rectal hyperplastic polyp   COLONOSCOPY WITH PROPOFOL  N/A 07/20/2019   multiple tubular adenomas. diverticulosis. 3 year surveillance   COLONOSCOPY WITH PROPOFOL  N/A 02/20/2023   Procedure: COLONOSCOPY WITH PROPOFOL ;  Surgeon: Shaaron Lamar HERO, MD;  Location: [facility];  Service: Endoscopy;  Laterality: N/A;  730am, asa 3   EYE SURGERY Bilateral 2019   Cat Sx - Dr. Hamilton Perley   LEFT HEART CATHETERIZATION WITH CORONARY ANGIOGRAM N/A 07/23/2011   Procedure: LEFT HEART CATHETERIZATION WITH CORONARY ANGIOGRAM;  Surgeon: Maude JAYSON Emmer, MD;  Location: [facility] CATH LAB;  Service: Cardiovascular;  Laterality: N/A;   POLYPECTOMY  07/20/2019   Procedure: POLYPECTOMY;  Surgeon: Shaaron Lamar HERO, MD;  Location: [facility];  Service: Endoscopy;;   POLYPECTOMY  02/20/2023   Procedure: POLYPECTOMY INTESTINAL;  Surgeon: Shaaron Lamar HERO, MD;  Location: [facility];  Service: Endoscopy;;   YAG LASER APPLICATION Right 09/01/2020   Dr. Redell Hans   YAG LASER APPLICATION Left 09/01/2020   Dr. Redell Hans   FAMILY HISTORY Family History  Problem Relation Age of Onset   Cancer Father    Diabetes Father    Colon cancer Sister    SOCIAL HISTORY Social History   Tobacco Use   Smoking status: Never   Smokeless tobacco: Never  Vaping Use   Vaping status: Never Used  Substance Use Topics   Alcohol use: No   Drug use: No       OPHTHALMIC EXAM:  Base Eye Exam     Visual Acuity (Snellen - Linear)       Right Left   Dist [place] 20/30 -1 20/20 -1         Tonometry (Tonopen, 8:06 AM)       Right Left   Pressure 18 16         Pupils       Dark Light Shape React APD   Right 3 2 Round Brisk None   Left 3 2 Round Brisk None         Visual Fields (Counting fingers)       Left Right    Full Full         Extraocular Movement       Right Left    Full, Ortho Full, Ortho         Neuro/Psych     Oriented x3: Yes   Mood/Affect: Normal         Dilation      Right eye:            Slit Lamp and Fundus Exam     Slit Lamp Exam       Right Left   Lids/Lashes Dermatochalasis - upper lid, mild MGD Dermatochalasis - upper lid, mild MGD   Conjunctiva/Sclera white and quiet white and quiet   Cornea  1-2+PEE, well healed temporal cataract wound 1+PEE, well healed temporal cataract wound   Anterior Chamber deep, clear, narrow temporal angle deep and clear   Iris round and dilated, No NVI round and dilated, No NVI   Lens PC IOL in good position, open PC PC IOL in good position with open PC   Anterior Vitreous syneresis, PVD syneresis, Posterior vitreous detachment, vitreous condensations         Fundus Exam       Right Left   Disc pink and sharp, compact, mild PPA pink and sharp, compact, mild PPP   C/D Ratio 0.3 0.1   Macula flat, blunted foveal reflex, ERM with striae inferiorly, punctate IRH - improved, +central cyst / edema, no exudates, no heme flat, blunted foveal reflex, mild ERM, rare MA, no edema, focal pigment clumping temporal to fovea, no heme   Vessels attenuated, tortuous attenuated, tortuous   Periphery attached, rare MA / DBH attached, rare MA/DBH, no RT/RD 360           Refraction     Wearing Rx       Sphere Cylinder Axis Add   Right [place]   +2.50   Left -0.25 +0.50 177 +2.50           IMAGING AND PROCEDURES  Imaging and Procedures for 03/25/2024  OCT, Retina - OU - Both Eyes       Right Eye Quality was good. Central Foveal Thickness: 484. Progression has been stable. Findings include no SRF, abnormal foveal contour, epiretinal membrane, intraretinal fluid, macular pucker (ERM with pucker -- stable; mild persistent IRF and central cyst -- stable).   Left Eye Quality was good. Central Foveal Thickness: 256. Progression has been stable. Findings include normal foveal contour, no IRF, no SRF, epiretinal membrane, macular pucker (ERM with early pucker, partial PVD).   Notes *Images captured and stored  on drive  Diagnosis / Impression:  OD: ERM with pucker -- stable; mild persistent IRF and central cyst  OS: ERM with early pucker, partial PVD  Clinical management:  See below  Abbreviations: NFP - Normal foveal profile. CME - cystoid macular edema. PED - pigment epithelial detachment. IRF - intraretinal fluid. SRF - subretinal fluid. EZ - ellipsoid zone. ERM - epiretinal membrane. ORA - outer retinal atrophy. ORT - outer retinal tubulation. SRHM - subretinal hyper-reflective material. IRHM - intraretinal hyper-reflective material      Intravitreal Injection, Pharmacologic Agent - OD - Right Eye       Time Out 03/25/2024. 7:51 AM. Confirmed correct patient, procedure, site, and patient consented.   Anesthesia Topical anesthesia was used. Anesthetic medications included Lidocaine  2%, Proparacaine 0.5%.   Procedure Preparation included 5% betadine  to ocular surface, eyelid speculum. A (32g) needle was used.   Injection: 2 mg aflibercept  2 MG/0.05ML   Route: Intravitreal, Site: Right Eye   NDC: [id number], Waste: 0 mL   Post-op Post injection exam found visual acuity of at least counting fingers. The patient tolerated the procedure well. There were no complications. The patient received written and verbal post procedure care education. Post injection medications were not given.              ASSESSMENT/PLAN:    ICD-10-CM   1. Moderate nonproliferative diabetic retinopathy of both eyes with macular edema associated with type 2 diabetes mellitus (HCC)  E11.3313 OCT, Retina - OU - Both Eyes    Intravitreal Injection, Pharmacologic Agent - OD - Right Eye    2. Current use  of insulin  (HCC)  Z79.4     3. Long term (current) use of oral hypoglycemic drugs  Z79.84     4. Long-term (current) use of injectable non-insulin  antidiabetic drugs  Z79.85     5. Epiretinal membrane (ERM) of both eyes  H35.373     6. Essential hypertension  I10     7. Hypertensive retinopathy of  both eyes  H35.033     8. Pseudophakia of both eyes  Z96.1        1-4. Moderate nonproliferative diabetic retinopathy OU - s/p IVA OD #1 (02.16.23), #2 (03.20.23), #3 (04.17.23), #4 (05.22.23), #5 (06.22.23), #6 (07.27.23),  #7 (09.01.23) #8 (10.06.23), #9 (11.09.23), #10 (12.15.23) #11 (01.12.24), #12 (02.09.24) #13 (03.15.24) - s/p IVE OD #1 (04.18.24), #2 (05.24.24), #3 (07.01.24), #4 (08.12.24), #5 (09.25.24), #6 (11.06.24), #7 (12.18.24), #8 (01.29.25), #9 (03.12.25), #10 (04.23.25), #11 (06.04.25), #12 (07.16.25), #13 (08.27.25) - A1C 7.1 (July 2025), 7.8 (06/25/23) - BCVA OD: stable at 20/30, OS: stable at 20/20 - exam shows scattered MA, no NV - OCT shows OD: ERM with pucker stable; mild persistent IRF and central cyst; OS: ERM with early pucker, partial PVD at 7 weeks - recommend IVE OD #14 today, 10.15.25 for DME component w/ f/u extended to 7 wks - pt wishes to proceed - RBA of procedure discussed, questions answered - IVE informed consent obtained and signed, 08.24.25 (OD)  - have obtained Eylea  approval for 2025 - [employer] funding unavailable -- pt covering 20% coinsurance - see procedure note  - f/u in 7 wks - DFE/OCT, possible injxn  5. Epiretinal membrane OU -- stable  - BCVA OD 20/30; OS 20/20 - OCT shows OD: ERM with pucker and prominent central cyst; persistent IRF and central cyst; OS: mild ERM with early pucker, partial PVD    - IRF OD: ?DME vs CME vs cystic changes - started PF and Prolensa  QID OD only for possible CME component on 12.8.22 -- continue, but suspect DME may be significant component  - no metamorphopsia  - monitor for now  - f/u 7 weeks -- DFE/OCT  6,7. Hypertensive retinopathy OU - discussed importance of tight BP control - monitor   8. Pseudophakia OU  - s/p CE/IOL OU, 2019 (Dr. Perley)  - IOLs in good position, doing well  - s/p YAG cap OD 3.24.22  - s/p YAG cap OS 04.07.22  - monitor  Ophthalmic Meds Ordered this visit:  No orders of  the defined types were placed in this encounter.    No follow-ups on file.  There are no Patient Instructions on file for this visit.  This document serves as a record of services personally performed by Redell JUDITHANN Hans, MD, PhD. It was created on their behalf by Almetta Pesa, an ophthalmic technician. The creation of this record is the provider's dictation and/or activities during the visit.    Electronically signed by: Almetta Pesa, OA, 03/25/24  8:47 AM   Redell JUDITHANN Hans, M.D., Ph.D. Diseases & Surgery of the Retina and Vitreous [facility] 12/25/2023     Abbreviations: M myopia (nearsighted); A astigmatism; H hyperopia (farsighted); P presbyopia; Mrx spectacle prescription;  CTL contact lenses; OD right eye; OS left eye; OU both eyes  XT exotropia; ET esotropia; PEK punctate epithelial keratitis; PEE punctate epithelial erosions; DES dry eye syndrome; MGD meibomian gland dysfunction; ATs artificial tears; PFAT's preservative free artificial tears; NSC nuclear sclerotic cataract; PSC posterior subcapsular cataract; ERM epi-retinal membrane; PVD  posterior vitreous detachment; RD retinal detachment; DM diabetes mellitus; DR diabetic retinopathy; NPDR non-proliferative diabetic retinopathy; PDR proliferative diabetic retinopathy; CSME clinically significant macular edema; DME diabetic macular edema; dbh dot blot hemorrhages; CWS cotton wool spot; POAG primary open angle glaucoma; C/D cup-to-disc ratio; HVF humphrey visual field; GVF goldmann visual field; OCT optical coherence tomography; IOP intraocular pressure; BRVO Branch retinal vein occlusion; CRVO central retinal vein occlusion; CRAO central retinal artery occlusion; BRAO branch retinal artery occlusion; RT retinal tear; SB scleral buckle; PPV pars plana vitrectomy; VH Vitreous hemorrhage; PRP panretinal laser photocoagulation; IVK intravitreal kenalog; VMT vitreomacular traction; MH Macular hole;  NVD  neovascularization of the disc; NVE neovascularization elsewhere; AREDS age related eye disease study; ARMD age related macular degeneration; POAG primary open angle glaucoma; EBMD epithelial/anterior basement membrane dystrophy; ACIOL anterior chamber intraocular lens; IOL intraocular lens; PCIOL posterior chamber intraocular lens; Phaco/IOL phacoemulsification with intraocular lens placement; PRK photorefractive keratectomy; LASIK laser assisted in situ keratomileusis; HTN hypertension; DM diabetes mellitus; COPD chronic obstructive pulmonary disease

## 2024-03-25 ENCOUNTER — Ambulatory Visit (INDEPENDENT_AMBULATORY_CARE_PROVIDER_SITE_OTHER): Admitting: Ophthalmology

## 2024-03-25 ENCOUNTER — Encounter (INDEPENDENT_AMBULATORY_CARE_PROVIDER_SITE_OTHER): Payer: Self-pay | Admitting: Ophthalmology

## 2024-03-25 DIAGNOSIS — I10 Essential (primary) hypertension: Secondary | ICD-10-CM | POA: Diagnosis not present

## 2024-03-25 DIAGNOSIS — H35373 Puckering of macula, bilateral: Secondary | ICD-10-CM | POA: Diagnosis not present

## 2024-03-25 DIAGNOSIS — Z794 Long term (current) use of insulin: Secondary | ICD-10-CM

## 2024-03-25 DIAGNOSIS — Z7984 Long term (current) use of oral hypoglycemic drugs: Secondary | ICD-10-CM | POA: Diagnosis not present

## 2024-03-25 DIAGNOSIS — Z7985 Long-term (current) use of injectable non-insulin antidiabetic drugs: Secondary | ICD-10-CM

## 2024-03-25 DIAGNOSIS — E113313 Type 2 diabetes mellitus with moderate nonproliferative diabetic retinopathy with macular edema, bilateral: Secondary | ICD-10-CM

## 2024-03-25 DIAGNOSIS — H35033 Hypertensive retinopathy, bilateral: Secondary | ICD-10-CM

## 2024-03-25 DIAGNOSIS — Z961 Presence of intraocular lens: Secondary | ICD-10-CM

## 2024-03-26 ENCOUNTER — Encounter (INDEPENDENT_AMBULATORY_CARE_PROVIDER_SITE_OTHER): Payer: Self-pay | Admitting: Ophthalmology

## 2024-03-26 MED ORDER — AFLIBERCEPT 2MG/0.05ML IZ SOLN FOR KALEIDOSCOPE
2.0000 mg | INTRAVITREAL | Status: AC | PRN
  Administered 2024-03-26: 2 mg via INTRAVITREAL

## 2024-04-30 NOTE — Progress Notes (Shared)
 . [facility] - Clinic Note  05/13/2024    CHIEF COMPLAINT Patient presents for No chief complaint on file.  HISTORY OF PRESENT ILLNESS: Megan Schroeder is a 78 y.o. female who presents to the clinic today for:     Pt doing well no changes in vision.   Referring physician: Hyacinth Honey, NP [address] Jew[place],  [place] [address]  HISTORICAL INFORMATION:   Selected notes from the MEDICAL RECORD NUMBER Referred by Dr. Vicci   CURRENT MEDICATIONS: Current Outpatient Medications (Ophthalmic Drugs)  Medication Sig   Bromfenac  Sodium 0.07 % SOLN Place 1 drop into the right eye 4 (four) times daily.   prednisoLONE  acetate (PRED FORTE ) 1 % ophthalmic suspension INSTILL 1 DROP INTO RIGHT EYE 4 TIMES A DAY   No current facility-administered medications for this visit. (Ophthalmic Drugs)   Current Outpatient Medications (Other)  Medication Sig   amLODipine  (NORVASC ) 10 MG tablet Take 1 tablet (10 mg total) by mouth daily. For BP   aspirin  EC 81 MG tablet Take 1 tablet (81 mg total) by mouth daily as needed for mild pain (pain score 1-3).   atorvastatin  (LIPITOR) 80 MG tablet Take 1 tablet (80 mg total) by mouth daily.   cholecalciferol  (VITAMIN D ) 1000 UNITS tablet Take 1,000 Units by mouth daily.    Coenzyme Q10 (COQ-10) 200 MG CAPS Take 200 mg by mouth daily.    Continuous Glucose Sensor (FREESTYLE LIBRE 3 PLUS SENSOR) MISC as directed.   dapagliflozin  propanediol (FARXIGA ) 10 MG TABS tablet Take 1 tablet (10 mg total) by mouth daily.   Dulaglutide (TRULICITY) 4.5 MG/0.5ML SOPN Inject 4.5 mg into the skin every Sunday.   ezetimibe (ZETIA) 10 MG tablet Take 10 mg by mouth daily.   GNP ULTICARE PEN NEEDLES 31G X 5 MM MISC See admin instructions.   levothyroxine  (SYNTHROID ) 75 MCG tablet Take 1 tablet (75 mcg total) by mouth daily before breakfast.   TOUJEO  SOLOSTAR 300 UNIT/ML Solostar Pen Inject 10 Units into the skin at bedtime.   No current  facility-administered medications for this visit. (Other)   REVIEW OF SYSTEMS:   ALLERGIES Allergies  Allergen Reactions   Penicillins Hives        Hydrocodone  Nausea And Vomiting   Sulfa Antibiotics Rash   PAST MEDICAL HISTORY Past Medical History:  Diagnosis Date   Anxiety    CAD (coronary artery disease)    Diabetic retinopathy (HCC)    NPDR OU   Hyperlipidemia    Hypertension    Hypertensive retinopathy    OU   Hypothyroidism    Type 2 diabetes mellitus (HCC)    Past Surgical History:  Procedure Laterality Date   BREAST EXCISIONAL BIOPSY Left    CATARACT EXTRACTION W/PHACO Left 12/16/2017   Procedure: CATARACT EXTRACTION PHACO AND INTRAOCULAR LENS PLACEMENT (IOC);  Surgeon: Perley Hamilton, MD;  Location: [facility];  Service: Ophthalmology;  Laterality: Left;  CDE: 11.65   CATARACT EXTRACTION W/PHACO Right 12/30/2017   Procedure: CATARACT EXTRACTION PHACO AND INTRAOCULAR LENS PLACEMENT RIGHT EYE;  Surgeon: Perley Hamilton, MD;  Location: [facility];  Service: Ophthalmology;  Laterality: Right;  CDE: 9.07   COLONOSCOPY N/A 09/15/2015   Dr. Tyra multiple rectal and colonic polyps removed. Hepatic flexure with 9 mm polyp, multiple 5-7 mm polyps. Multiple cecal polyps with largest 1.5 cm, one 5 mm polyp in rectum. Path for colonic polyps with sessile serrated polyp without dysplasia, no high grade dysplasia, tubular adenomas, rectal  hyperplastic polyp   COLONOSCOPY WITH PROPOFOL  N/A 07/20/2019   multiple tubular adenomas. diverticulosis. 3 year surveillance   COLONOSCOPY WITH PROPOFOL  N/A 02/20/2023   Procedure: COLONOSCOPY WITH PROPOFOL ;  Surgeon: Shaaron Lamar HERO, MD;  [facility]: [facility];  Service: Endoscopy;  Laterality: N/A;  730am, asa 3   EYE SURGERY Bilateral 2019   Cat Sx - Dr. Cherene Mania   LEFT HEART CATHETERIZATION WITH CORONARY ANGIOGRAM N/A 07/23/2011   Procedure: LEFT HEART CATHETERIZATION WITH CORONARY ANGIOGRAM;  Surgeon: Maude JAYSON Emmer, MD;  Location: [facility] CATH LAB;   Service: Cardiovascular;  Laterality: N/A;   POLYPECTOMY  07/20/2019   Procedure: POLYPECTOMY;  Surgeon: Shaaron Lamar HERO, MD;  [facility];  Service: Endoscopy;;   POLYPECTOMY  02/20/2023   Procedure: POLYPECTOMY INTESTINAL;  Surgeon: Shaaron Lamar HERO, MD;  [facility];  Service: Endoscopy;;   YAG LASER APPLICATION Right 09/01/2020   Dr. Redell Hans   YAG LASER APPLICATION Left 09/01/2020   Dr. Redell Hans   FAMILY HISTORY Family History  Problem Relation Age of Onset   Cancer Father    Diabetes Father    Colon cancer Sister    SOCIAL HISTORY Social History   Tobacco Use   Smoking status: Never   Smokeless tobacco: Never  Vaping Use   Vaping status: Never Used  Substance Use Topics   Alcohol use: No   Drug use: No       OPHTHALMIC EXAM:  Not recorded    IMAGING AND PROCEDURES  Imaging and Procedures for 05/13/2024          ASSESSMENT/PLAN:  No diagnosis found.    1-4. Moderate nonproliferative diabetic retinopathy OU - s/p IVA OD #1 (02.16.23), #2 (03.20.23), #3 (04.17.23), #4 (05.22.23), #5 (06.22.23), #6 (07.27.23),  #7 (09.01.23) #8 (10.06.23), #9 (11.09.23), #10 (12.15.23) #11 (01.12.24), #12 (02.09.24) #13 (03.15.24) - s/p IVE OD #1 (04.18.24), #2 (05.24.24), #3 (07.01.24), #4 (08.12.24), #5 (09.25.24), #6 (11.06.24), #7 (12.18.24), #8 (01.29.25), #9 (03.12.25), #10 (04.23.25), #11 (06.04.25), #12 (07.16.25), #13 (08.27.25), #14 (10.15.25) - A1C 7.1 (July 2025), 7.8 (06/25/23) - BCVA OD: stable at 20/30, OS: stable at 20/20 - exam shows scattered MA, no NV - OCT shows OD: ERM with pucker stable; mild persistent IRF and central cyst; OS: ERM with early pucker, partial PVD at 7 weeks - recommend IVE OD #15 today, 12.03.25 for DME component w/ f/u in 7 wks - pt wishes to proceed - RBA of procedure discussed, questions answered - IVE informed consent obtained and signed, 08.24.25 (OD)  - have obtained Eylea  approval for 2025 - [employer] funding unavailable -- pt covering 20% coinsurance - see procedure note  - f/u in 7 wks - DFE/OCT, possible injxn  5. Epiretinal membrane OU -- stable  - BCVA OD 20/30; OS 20/20 - OCT shows OD: ERM with pucker and prominent central cyst; persistent IRF and central cyst; OS: mild ERM with early pucker, partial PVD    - IRF OD: ?DME vs CME vs cystic changes - started PF and Prolensa  QID OD only for possible CME component on 12.8.22 -- continue, but suspect DME may be significant component  - no metamorphopsia  - monitor for now  - f/u 7 weeks -- DFE/OCT  6,7. Hypertensive retinopathy OU - discussed importance of tight BP control - monitor   8. Pseudophakia OU  - s/p CE/IOL OU, 2019 (Dr. Mania)  - IOLs in good position, doing well  - s/p YAG cap  OD 3.24.22  - s/p YAG cap OS 04.07.22  - monitor  Ophthalmic Meds Ordered this visit:  No orders of the defined types were placed in this encounter.    No follow-ups on file.  There are no Patient Instructions on file for this visit.  This document serves as a record of services personally performed by Redell JUDITHANN Hans, MD, PhD. It was created on their behalf by Almetta Pesa, an ophthalmic technician. The creation of this record is the provider's dictation and/or activities during the visit.    Electronically signed by: Almetta Pesa, OA, 04/30/24  11:50 AM  Redell JUDITHANN Hans, M.D., Ph.D. Diseases & Surgery of the Retina and Vitreous [facility]    Abbreviations: M myopia (nearsighted); A astigmatism; H hyperopia (farsighted); P presbyopia; Mrx spectacle prescription;  CTL contact lenses; OD right eye; OS left eye; OU both eyes  XT exotropia; ET esotropia; PEK punctate epithelial keratitis; PEE punctate epithelial erosions; DES dry eye syndrome; MGD meibomian gland dysfunction; ATs artificial tears; PFAT's preservative free artificial tears; NSC nuclear sclerotic cataract; PSC posterior subcapsular  cataract; ERM epi-retinal membrane; PVD posterior vitreous detachment; RD retinal detachment; DM diabetes mellitus; DR diabetic retinopathy; NPDR non-proliferative diabetic retinopathy; PDR proliferative diabetic retinopathy; CSME clinically significant macular edema; DME diabetic macular edema; dbh dot blot hemorrhages; CWS cotton wool spot; POAG primary open angle glaucoma; C/D cup-to-disc ratio; HVF humphrey visual field; GVF goldmann visual field; OCT optical coherence tomography; IOP intraocular pressure; BRVO Branch retinal vein occlusion; CRVO central retinal vein occlusion; CRAO central retinal artery occlusion; BRAO branch retinal artery occlusion; RT retinal tear; SB scleral buckle; PPV pars plana vitrectomy; VH Vitreous hemorrhage; PRP panretinal laser photocoagulation; IVK intravitreal kenalog; VMT vitreomacular traction; MH Macular hole;  NVD neovascularization of the disc; NVE neovascularization elsewhere; AREDS age related eye disease study; ARMD age related macular degeneration; POAG primary open angle glaucoma; EBMD epithelial/anterior basement membrane dystrophy; ACIOL anterior chamber intraocular lens; IOL intraocular lens; PCIOL posterior chamber intraocular lens; Phaco/IOL phacoemulsification with intraocular lens placement; PRK photorefractive keratectomy; LASIK laser assisted in situ keratomileusis; HTN hypertension; DM diabetes mellitus; COPD chronic obstructive pulmonary disease

## 2024-05-10 ENCOUNTER — Other Ambulatory Visit: Payer: Self-pay

## 2024-05-10 ENCOUNTER — Emergency Department (HOSPITAL_COMMUNITY)
Admission: EM | Admit: 2024-05-10 | Discharge: 2024-05-10 | Disposition: A | Discharge status: Home / Self Care | Attending: Emergency Medicine | Admitting: Emergency Medicine

## 2024-05-10 ENCOUNTER — Emergency Department (HOSPITAL_COMMUNITY)

## 2024-05-10 ENCOUNTER — Encounter (HOSPITAL_COMMUNITY): Payer: Self-pay | Admitting: Emergency Medicine

## 2024-05-10 DIAGNOSIS — Z7982 Long term (current) use of aspirin: Secondary | ICD-10-CM | POA: Insufficient documentation

## 2024-05-10 DIAGNOSIS — R21 Rash and other nonspecific skin eruption: Secondary | ICD-10-CM | POA: Diagnosis present

## 2024-05-10 DIAGNOSIS — L03211 Cellulitis of face: Secondary | ICD-10-CM | POA: Insufficient documentation

## 2024-05-10 LAB — CBC WITH DIFFERENTIAL/PLATELET
Abs Immature Granulocytes: 0.04 K/uL (ref 0.00–0.07)
Basophils Absolute: 0.1 K/uL (ref 0.0–0.1)
Basophils Relative: 0 %
Eosinophils Absolute: 0.1 K/uL (ref 0.0–0.5)
Eosinophils Relative: 1 %
HCT: 37.7 % (ref 36.0–46.0)
Hemoglobin: 12.4 g/dL (ref 12.0–15.0)
Immature Granulocytes: 0 %
Lymphocytes Relative: 18 %
Lymphs Abs: 2.1 K/uL (ref 0.7–4.0)
MCH: 28.6 pg (ref 26.0–34.0)
MCHC: 32.9 g/dL (ref 30.0–36.0)
MCV: 86.9 fL (ref 80.0–100.0)
Monocytes Absolute: 0.7 K/uL (ref 0.1–1.0)
Monocytes Relative: 6 %
Neutro Abs: 8.6 K/uL — ABNORMAL HIGH (ref 1.7–7.7)
Neutrophils Relative %: 75 %
Platelets: ADEQUATE K/uL (ref 150–400)
RBC: 4.34 MIL/uL (ref 3.87–5.11)
RDW: 13.7 % (ref 11.5–15.5)
WBC: 11.7 K/uL — ABNORMAL HIGH (ref 4.0–10.5)
nRBC: 0 % (ref 0.0–0.2)

## 2024-05-10 LAB — COMPREHENSIVE METABOLIC PANEL WITH GFR
ALT: 14 U/L (ref 0–44)
AST: 21 U/L (ref 15–41)
Albumin: 4.6 g/dL (ref 3.5–5.0)
Alkaline Phosphatase: 92 U/L (ref 38–126)
Anion gap: 16 — ABNORMAL HIGH (ref 5–15)
BUN: 12 mg/dL (ref 8–23)
CO2: 21 mmol/L — ABNORMAL LOW (ref 22–32)
Calcium: 9.9 mg/dL (ref 8.9–10.3)
Chloride: 102 mmol/L (ref 98–111)
Creatinine, Ser: 0.75 mg/dL (ref 0.44–1.00)
GFR, Estimated: >60 mL/min (ref >60)
Glucose, Bld: 128 mg/dL — ABNORMAL HIGH (ref 70–99)
Potassium: 4 mmol/L (ref 3.5–5.1)
Sodium: 139 mmol/L (ref 135–145)
Total Bilirubin: 0.8 mg/dL (ref 0.0–1.2)
Total Protein: 8.2 g/dL — ABNORMAL HIGH (ref 6.5–8.1)

## 2024-05-10 MED ORDER — VANCOMYCIN HCL 1500 MG/300ML IV SOLN
1500.0000 mg | Freq: Once | INTRAVENOUS | Status: AC
  Administered 2024-05-10: 1500 mg via INTRAVENOUS
  Filled 2024-05-10: qty 300

## 2024-05-10 MED ORDER — IOHEXOL 300 MG/ML  SOLN
75.0000 mL | Freq: Once | INTRAMUSCULAR | Status: AC | PRN
  Administered 2024-05-10: 75 mL via INTRAVENOUS

## 2024-05-10 MED ORDER — VANCOMYCIN HCL IN DEXTROSE 1-5 GM/200ML-% IV SOLN: 1000.0000 mg | Freq: Once | INTRAVENOUS | Status: DC

## 2024-05-10 MED ORDER — CEPHALEXIN 500 MG PO CAPS: 500.0000 mg | ORAL_CAPSULE | Freq: Four times a day (QID) | ORAL | 0 refills | Status: DC

## 2024-05-10 NOTE — ED Notes (Signed)
 Spoke with CT regarding when pt will be going to have exam. Was told they are working in-patient at the moment and a STAT PE on the way but this pt is on the list.

## 2024-05-10 NOTE — ED Triage Notes (Signed)
 Pt with c/o R sided facial swelling from abscess to R cheek area x 2 days.

## 2024-05-10 NOTE — Discharge Instructions (Signed)
Follow-up with your doctor in 2 to 3 days for recheck.  Return sooner if any problems

## 2024-05-10 NOTE — ED Provider Notes (Signed)
 [place] EMERGENCY DEPARTMENT AT [facility] Provider Note   CSN: [id number] Arrival date & time: 05/10/24  9363     Patient presents with: Facial Swelling   Megan Schroeder is a 78 y.o. female.  {Add pertinent medical, surgical, social history, OB history to YEP:[id number]} Patient complains of facial swelling and tenderness.   Rash      Prior to Admission medications   Medication Sig Start Date End Date Taking? Authorizing Provider  cephALEXin (KEFLEX) 500 MG capsule Take 1 capsule (500 mg total) by mouth 4 (four) times daily. 05/10/24  Yes Alysha Doolan, MD  amLODipine  (NORVASC ) 10 MG tablet Take 1 tablet (10 mg total) by mouth daily. For BP 05/17/20   Pearlean Manus, MD  aspirin  EC 81 MG tablet Take 1 tablet (81 mg total) by mouth daily as needed for mild pain (pain score 1-3). 06/26/23   Johnson, Clanford L, MD  atorvastatin  (LIPITOR) 80 MG tablet Take 1 tablet (80 mg total) by mouth daily. 10/07/23   Debera Jayson MATSU, MD  Bromfenac  Sodium 0.07 % SOLN Place 1 drop into the right eye 4 (four) times daily. 07/19/23   Valdemar Rogue, MD  cholecalciferol  (VITAMIN D ) 1000 UNITS tablet Take 1,000 Units by mouth daily.     [provider]  Coenzyme Q10 (COQ-10) 200 MG CAPS Take 200 mg by mouth daily.     [provider]  Continuous Glucose Sensor (FREESTYLE LIBRE 3 PLUS SENSOR) MISC as directed. 09/11/23   [provider]  dapagliflozin  propanediol (FARXIGA ) 10 MG TABS tablet Take 1 tablet (10 mg total) by mouth daily. 05/17/20   Pearlean Manus, MD  Dulaglutide (TRULICITY) 4.5 MG/0.5ML SOPN Inject 4.5 mg into the skin every Sunday.    [provider]  ezetimibe (ZETIA) 10 MG tablet Take 10 mg by mouth daily. 08/19/23   [provider]  GNP ULTICARE PEN NEEDLES 31G X 5 MM MISC See admin instructions. 09/07/20   [provider]  levothyroxine  (SYNTHROID ) 75 MCG tablet Take 1 tablet (75 mcg total) by mouth daily before  breakfast. 05/17/20   Pearlean, Courage, MD  prednisoLONE  acetate (PRED FORTE ) 1 % ophthalmic suspension INSTILL 1 DROP INTO RIGHT EYE 4 TIMES A DAY 03/16/24   Valdemar Rogue, MD  TOUJEO  SOLOSTAR 300 UNIT/ML Solostar Pen Inject 10 Units into the skin at bedtime. 06/26/23   Vicci Afton CROME, MD    Allergies: Penicillins, Hydrocodone , and Sulfa antibiotics    Review of Systems  Skin:  Positive for rash.    Updated Vital Signs BP 117/80 (BP Location: Left Arm)   Pulse 84   Temp 98.1 F (36.7 C) (Oral)   Resp 20   Ht 5' 3 (1.6 m)   Wt 65.8 kg   SpO2 95%   BMI 25.69 kg/m   Physical Exam  (all labs ordered are listed, but only abnormal results are displayed) Labs Reviewed  COMPREHENSIVE METABOLIC PANEL WITH GFR - Abnormal; Notable for the following components:      Result Value   CO2 21 (*)    Glucose, Bld 128 (*)    Total Protein 8.2 (*)    Anion gap 16 (*)    All other components within normal limits  CBC WITH DIFFERENTIAL/PLATELET - Abnormal; Notable for the following components:   WBC 11.7 (*)    Neutro Abs 8.6 (*)    All other components within normal limits    EKG: None  Radiology: CT Maxillofacial W Contrast Result Date:  05/10/2024 EXAM: CT Face with contrast 05/10/2024 10:11:58 AM TECHNIQUE: CT of the face was performed with the administration of 75 mL of iohexol  (OMNIPAQUE ) 300 MG/ML solution. Multiplanar reformatted images are provided for review. Automated exposure control, iterative reconstruction, and/or weight based adjustment of the mA/kV was utilized to reduce the radiation dose to as low as reasonably achievable. COMPARISON: None available CLINICAL HISTORY: cellulitis FINDINGS: AERODIGESTIVE TRACT: Leftward deviation of the nasal septum. No mass. No edema. SALIVARY GLANDS: No acute abnormality. LYMPH NODES: No suspicious cervical lymphadenopathy. SOFT TISSUES: Mild right facial swelling with subcutaneous induration. No abscess or drainable fluid collection.  BRAIN, ORBITS AND SINUSES: No acute abnormality. BONES: No acute abnormality. No suspicious bone lesion. IMPRESSION: 1. Mild right facial swelling with subcutaneous induration without abscess or drainable fluid collection. Electronically signed by: Franky Stanford MD 05/10/2024 10:51 AM EST RP Workstation: [id number]    {Document cardiac monitor, telemetry assessment procedure when appropriate:32947} Procedures   Medications Ordered in the ED  vancomycin (VANCOREADY) IVPB 1500 mg/300 mL (1,500 mg Intravenous New Bag/Given 05/10/24 0851)  iohexol  (OMNIPAQUE ) 300 MG/ML solution 75 mL (75 mLs Intravenous Contrast Given 05/10/24 1011)      {Click here for ABCD2, HEART and other calculators REFRESH Note before signing:1}                              Medical Decision Making Amount and/or Complexity of Data Reviewed Labs: ordered. Radiology: ordered.  Risk Prescription drug management.   Patient with a cellulitis to the right side of her face.  She was given vancomycin in the emergency department and will be put on Keflex and follow-up with her PCP in a couple days  {Document critical care time when appropriate  Document review of labs and clinical decision tools ie CHADS2VASC2, etc  Document your independent review of radiology images and any outside records  Document your discussion with family members, caretakers and with consultants  Document social determinants of health affecting pt's care  Document your decision making why or why not admission, treatments were needed:32947:::1}   Final diagnoses:  Facial cellulitis    ED Discharge Orders          Ordered    cephALEXin (KEFLEX) 500 MG capsule  4 times daily        05/10/24 1125

## 2024-05-13 ENCOUNTER — Encounter (INDEPENDENT_AMBULATORY_CARE_PROVIDER_SITE_OTHER): Admitting: Ophthalmology

## 2024-05-13 DIAGNOSIS — I10 Essential (primary) hypertension: Secondary | ICD-10-CM

## 2024-05-13 DIAGNOSIS — H35373 Puckering of macula, bilateral: Secondary | ICD-10-CM

## 2024-05-13 DIAGNOSIS — E113313 Type 2 diabetes mellitus with moderate nonproliferative diabetic retinopathy with macular edema, bilateral: Secondary | ICD-10-CM

## 2024-05-13 DIAGNOSIS — Z794 Long term (current) use of insulin: Secondary | ICD-10-CM

## 2024-05-13 DIAGNOSIS — Z961 Presence of intraocular lens: Secondary | ICD-10-CM

## 2024-05-13 DIAGNOSIS — H35033 Hypertensive retinopathy, bilateral: Secondary | ICD-10-CM

## 2024-05-13 DIAGNOSIS — Z7985 Long-term (current) use of injectable non-insulin antidiabetic drugs: Secondary | ICD-10-CM

## 2024-05-13 DIAGNOSIS — Z7984 Long term (current) use of oral hypoglycemic drugs: Secondary | ICD-10-CM

## 2024-05-13 NOTE — Progress Notes (Signed)
 [facility] - Clinic Note  05/20/2024    CHIEF COMPLAINT Patient presents for Retina Follow Up  HISTORY OF PRESENT ILLNESS: Megan Schroeder is a 78 y.o. female who presents to the clinic today for:   HPI     Retina Follow Up   Patient presents with  Diabetic Retinopathy.  In both eyes.  This started 8 weeks ago.  I, the attending physician,  performed the HPI with the patient and updated documentation appropriately.        Comments   Patient here for 8 weeks retina follow up for NPDR OU. Patient states vision about the same. No difference. No eye pain.       Last edited by Valdemar Rogue, MD on 05/20/2024  9:54 PM.     Pt feels the vision is the same.   Referring physician: Hyacinth Honey, NP [address] [place],  [place] [address]  HISTORICAL INFORMATION:   Selected notes from the MEDICAL RECORD NUMBER Referred by Dr. Vicci   CURRENT MEDICATIONS: Current Outpatient Medications (Ophthalmic Drugs)  Medication Sig   Bromfenac  Sodium 0.07 % SOLN Place 1 drop into the right eye 4 (four) times daily.   prednisoLONE  acetate (PRED FORTE ) 1 % ophthalmic suspension INSTILL 1 DROP INTO RIGHT EYE 4 TIMES A DAY   No current facility-administered medications for this visit. (Ophthalmic Drugs)   Current Outpatient Medications (Other)  Medication Sig   amLODipine  (NORVASC ) 10 MG tablet Take 1 tablet (10 mg total) by mouth daily. For BP   aspirin  EC 81 MG tablet Take 1 tablet (81 mg total) by mouth daily as needed for mild pain (pain score 1-3).   atorvastatin  (LIPITOR) 80 MG tablet Take 1 tablet (80 mg total) by mouth daily.   cephALEXin  (KEFLEX ) 500 MG capsule Take 1 capsule (500 mg total) by mouth 4 (four) times daily.   cholecalciferol  (VITAMIN D ) 1000 UNITS tablet Take 1,000 Units by mouth daily.    Coenzyme Q10 (COQ-10) 200 MG CAPS Take 200 mg by mouth daily.    Continuous Glucose Sensor (FREESTYLE LIBRE 3 PLUS SENSOR) MISC as directed.    dapagliflozin  propanediol (FARXIGA ) 10 MG TABS tablet Take 1 tablet (10 mg total) by mouth daily.   Dulaglutide (TRULICITY) 4.5 MG/0.5ML SOPN Inject 4.5 mg into the skin every Sunday.   ezetimibe (ZETIA) 10 MG tablet Take 10 mg by mouth daily.   GNP ULTICARE PEN NEEDLES 31G X 5 MM MISC See admin instructions.   levothyroxine  (SYNTHROID ) 75 MCG tablet Take 1 tablet (75 mcg total) by mouth daily before breakfast.   TOUJEO  SOLOSTAR 300 UNIT/ML Solostar Pen Inject 10 Units into the skin at bedtime.   No current facility-administered medications for this visit. (Other)   REVIEW OF SYSTEMS: ROS   Positive for: Gastrointestinal, Endocrine, Cardiovascular, Eyes Negative for: Constitutional, Neurological, Skin, Genitourinary, Musculoskeletal, HENT, Respiratory, Psychiatric, Allergic/Imm, Heme/Lymph Last edited by Orval Asberry RAMAN, COA on 05/20/2024  8:08 AM.      ALLERGIES Allergies  Allergen Reactions   Penicillins Hives        Hydrocodone  Nausea And Vomiting   Sulfa Antibiotics Rash   PAST MEDICAL HISTORY Past Medical History:  Diagnosis Date   Anxiety    CAD (coronary artery disease)    Diabetic retinopathy (HCC)    NPDR OU   Hyperlipidemia    Hypertension    Hypertensive retinopathy    OU   Hypothyroidism    Type 2 diabetes mellitus (HCC)  Past Surgical History:  Procedure Laterality Date   BREAST EXCISIONAL BIOPSY Left    CATARACT EXTRACTION W/PHACO Left 12/16/2017   Procedure: CATARACT EXTRACTION PHACO AND INTRAOCULAR LENS PLACEMENT (IOC);  Surgeon: Perley Hamilton, MD;  Location: [facility];  Service: Ophthalmology;  Laterality: Left;  CDE: 11.65   CATARACT EXTRACTION W/PHACO Right 12/30/2017   Procedure: CATARACT EXTRACTION PHACO AND INTRAOCULAR LENS PLACEMENT RIGHT EYE;  Surgeon: Perley Hamilton, MD;  Location: [facility];  Service: Ophthalmology;  Laterality: Right;  CDE: 9.07   COLONOSCOPY N/A 09/15/2015   Dr. Tyra multiple rectal and colonic polyps removed. Hepatic flexure with  9 mm polyp, multiple 5-7 mm polyps. Multiple cecal polyps with largest 1.5 cm, one 5 mm polyp in rectum. Path for colonic polyps with sessile serrated polyp without dysplasia, no high grade dysplasia, tubular adenomas, rectal hyperplastic polyp   COLONOSCOPY WITH PROPOFOL  N/A 07/20/2019   multiple tubular adenomas. diverticulosis. 3 year surveillance   COLONOSCOPY WITH PROPOFOL  N/A 02/20/2023   Procedure: COLONOSCOPY WITH PROPOFOL ;  Surgeon: Shaaron Lamar HERO, MD;  Location: [facility];  Service: Endoscopy;  Laterality: N/A;  730am, asa 3   EYE SURGERY Bilateral 2019   Cat Sx - Dr. Hamilton Perley   LEFT HEART CATHETERIZATION WITH CORONARY ANGIOGRAM N/A 07/23/2011   Procedure: LEFT HEART CATHETERIZATION WITH CORONARY ANGIOGRAM;  Surgeon: Maude JAYSON Emmer, MD;  Location: [facility] CATH LAB;  Service: Cardiovascular;  Laterality: N/A;   POLYPECTOMY  07/20/2019   Procedure: POLYPECTOMY;  Surgeon: Shaaron Lamar HERO, MD;  Location: [facility];  Service: Endoscopy;;   POLYPECTOMY  02/20/2023   Procedure: POLYPECTOMY INTESTINAL;  Surgeon: Shaaron Lamar HERO, MD;  Location: [facility];  Service: Endoscopy;;   YAG LASER APPLICATION Right 09/01/2020   Dr. Redell Hans   YAG LASER APPLICATION Left 09/01/2020   Dr. Redell Hans   FAMILY HISTORY Family History  Problem Relation Age of Onset   Cancer Father    Diabetes Father    Colon cancer Sister    SOCIAL HISTORY Social History   Tobacco Use   Smoking status: Never   Smokeless tobacco: Never  Vaping Use   Vaping status: Never Used  Substance Use Topics   Alcohol use: No   Drug use: No       OPHTHALMIC EXAM:  Base Eye Exam     Visual Acuity (Snellen - Linear)       Right Left   Dist [place] 20/30 -1 20/20 -2         Tonometry (Tonopen, 8:06 AM)       Right Left   Pressure 18 19         Pupils       Dark Light Shape React APD   Right 3 2 Round Brisk None   Left 3 2 Round Brisk None         Visual Fields (Counting fingers)        Left Right    Full Full         Extraocular Movement       Right Left    Full, Ortho Full, Ortho         Neuro/Psych     Oriented x3: Yes   Mood/Affect: Normal         Dilation     Both eyes: 1.0% Mydriacyl, 2.5% Phenylephrine  @ 8:06 AM           Slit Lamp and Fundus Exam     Slit Lamp Exam  Right Left   Lids/Lashes Dermatochalasis - upper lid, mild MGD Dermatochalasis - upper lid, mild MGD   Conjunctiva/Sclera white and quiet white and quiet   Cornea 1-2+PEE, well healed temporal cataract wound 1+PEE, well healed temporal cataract wound   Anterior Chamber deep, clear, narrow temporal angle deep and clear   Iris round and dilated, No NVI round and dilated, No NVI   Lens PC IOL in good position, open PC PC IOL in good position with open PC   Anterior Vitreous syneresis, PVD syneresis, Posterior vitreous detachment, vitreous condensations         Fundus Exam       Right Left   Disc pink and sharp, compact, mild PPA pink and sharp, compact, mild PPP   C/D Ratio 0.3 0.1   Macula flat, blunted foveal reflex, ERM with striae inferiorly, punctate IRH - stably improved, +central cyst / edema, no exudates, no heme flat, blunted foveal reflex, mild ERM, rare MA, no edema, focal pigment clumping temporal to fovea, no heme   Vessels attenuated, tortuous attenuated, tortuous   Periphery attached, rare MA / DBH attached, rare MA/DBH, no RT/RD 360           Refraction     Wearing Rx       Sphere Cylinder Axis Add   Right [place]   +2.50   Left -0.25 +0.50 177 +2.50           IMAGING AND PROCEDURES  Imaging and Procedures for 05/20/2024  OCT, Retina - OU - Both Eyes       Right Eye Quality was good. Central Foveal Thickness: 460. Progression has been stable. Findings include no SRF, abnormal foveal contour, epiretinal membrane, intraretinal fluid, lamellar hole, macular pucker (ERM with lamellar hole and pucker -- stable; mild persistent IRF and  central cyst -- stable).   Left Eye Quality was good. Central Foveal Thickness: 255. Progression has been stable. Findings include normal foveal contour, no IRF, no SRF, epiretinal membrane, macular pucker (ERM with early pucker, partial PVD).   Notes *Images captured and stored on drive  Diagnosis / Impression:  OD: ERM with lamellar hole and pucker -- stable; mild persistent IRF and central cyst -- stable DME OS: ERM with early pucker, partial PVD  Clinical management:  See below  Abbreviations: NFP - Normal foveal profile. CME - cystoid macular edema. PED - pigment epithelial detachment. IRF - intraretinal fluid. SRF - subretinal fluid. EZ - ellipsoid zone. ERM - epiretinal membrane. ORA - outer retinal atrophy. ORT - outer retinal tubulation. SRHM - subretinal hyper-reflective material. IRHM - intraretinal hyper-reflective material      Intravitreal Injection, Pharmacologic Agent - OD - Right Eye       Time Out 05/20/2024. 8:44 AM. Confirmed correct patient, procedure, site, and patient consented.   Anesthesia Topical anesthesia was used. Anesthetic medications included Lidocaine  2%, Proparacaine 0.5%.   Procedure Preparation included 5% betadine  to ocular surface, eyelid speculum. A (32g) needle was used.   Injection: 2 mg aflibercept  2 MG/0.05ML   Route: Intravitreal, Site: Right Eye   NDC: [id number], Lot: [id number], Expiration date: 07/11/2025, Waste: 0 mL   Post-op Post injection exam found visual acuity of at least counting fingers. The patient tolerated the procedure well. There were no complications. The patient received written and verbal post procedure care education. Post injection medications were not given.            ASSESSMENT/PLAN:    ICD-10-CM   1. Moderate  nonproliferative diabetic retinopathy of both eyes with macular edema associated with type 2 diabetes mellitus (HCC)  E11.3313 OCT, Retina - OU - Both Eyes    Intravitreal Injection,  Pharmacologic Agent - OD - Right Eye    aflibercept  (EYLEA ) SOLN 2 mg    2. Current use of insulin  (HCC)  Z79.4     3. Long term (current) use of oral hypoglycemic drugs  Z79.84     4. Long-term (current) use of injectable non-insulin  antidiabetic drugs  Z79.85     5. Epiretinal membrane (ERM) of both eyes  H35.373     6. Essential hypertension  I10     7. Hypertensive retinopathy of both eyes  H35.033     8. Pseudophakia of both eyes  Z96.1      1-4. Moderate nonproliferative diabetic retinopathy OU - s/p IVA OD #1 (02.16.23), #2 (03.20.23), #3 (04.17.23), #4 (05.22.23), #5 (06.22.23), #6 (07.27.23), #7 (09.01.23) #8 (10.06.23), #9 (11.09.23), #10 (12.15.23) #11 (01.12.24), #12 (02.09.24) #13 (03.15.24) ================ - s/p IVE OD #1 (04.18.24), #2 (05.24.24), #3 (07.01.24), #4 (08.12.24), #5 (09.25.24), #6 (11.06.24), #7 (12.18.24), #8 (01.29.25), #9 (03.12.25), #10 (04.23.25), #11 (06.04.25), #12 (07.16.25), #13 (08.27.25), #14 (10.15.25) - A1C 7.1 (July 2025), 7.8 (10.15.25) - BCVA OD: stable at 20/30, OS: stable at 20/20 - exam shows scattered MA, no NV - OCT shows OD: ERM with lamellar hole and pucker -- stable; mild persistent IRF and central cyst -- stable; OS: ERM with early pucker, partial PVD at 8 weeks - recommend IVE OD #15 today, 12.10.25 for DME component w/ f/u in 8 wks - pt wishes to proceed - RBA of procedure discussed, questions answered - IVE informed consent obtained and signed, 08.24.25 (OD)  - have obtained Eylea  approval for 2025 - [employer] funding unavailable -- pt covering 20% coinsurance - see procedure note  - f/u in 8 wks - DFE/OCT, possible injxn  5. Epiretinal membrane OU -- stable  - BCVA OD 20/30; OS 20/20 - OCT shows OD: ERM with pucker and prominent central cyst; persistent IRF and central cyst; OS: mild ERM with early pucker, partial PVD    - IRF OD: ?DME vs CME vs cystic changes - started PF and Prolensa  QID OD only for possible CME  component on 12.8.22 -- continue, but suspect DME may be significant component  - no metamorphopsia  - monitor for now  - f/u 8 weeks -- DFE/OCT  6,7. Hypertensive retinopathy OU - discussed importance of tight BP control - monitor   8. Pseudophakia OU  - s/p CE/IOL OU, 2019 (Dr. Perley)  - IOLs in good position, doing well  - s/p YAG cap OD 3.24.22  - s/p YAG cap OS 04.07.22  - monitor  Ophthalmic Meds Ordered this visit:  Meds ordered this encounter  Medications   aflibercept  (EYLEA ) SOLN 2 mg     Return in about 8 weeks (around 07/15/2024) for f/u, NPDR, DFE, OCT, Possible, IVE, OD.  There are no Patient Instructions on file for this visit.  This document serves as a record of services personally performed by Redell JUDITHANN Hans, MD, PhD. It was created on their behalf by Almetta Pesa, an ophthalmic technician. The creation of this record is the provider's dictation and/or activities during the visit.    Electronically signed by: Almetta Pesa, OA, 05/20/24  9:55 PM  This document serves as a record of services personally performed by Redell JUDITHANN Hans, MD, PhD. It was created on their behalf by  Jermiah GEANNIE Keens, COT an ophthalmic technician. The creation of this record is the provider's dictation and/or activities during the visit.    Electronically signed by:  Shantara GEANNIE Keens, COT  05/20/24 9:55 PM  Redell JUDITHANN Hans, M.D., Ph.D. Diseases & Surgery of the Retina and Vitreous [facility]  I have reviewed the above documentation for accuracy and completeness, and I agree with the above. Redell JUDITHANN Hans, M.D., Ph.D. 05/20/24 9:57 PM   Abbreviations: M myopia (nearsighted); A astigmatism; H hyperopia (farsighted); P presbyopia; Mrx spectacle prescription;  CTL contact lenses; OD right eye; OS left eye; OU both eyes  XT exotropia; ET esotropia; PEK punctate epithelial keratitis; PEE punctate epithelial erosions; DES dry eye syndrome; MGD meibomian  gland dysfunction; ATs artificial tears; PFAT's preservative free artificial tears; NSC nuclear sclerotic cataract; PSC posterior subcapsular cataract; ERM epi-retinal membrane; PVD posterior vitreous detachment; RD retinal detachment; DM diabetes mellitus; DR diabetic retinopathy; NPDR non-proliferative diabetic retinopathy; PDR proliferative diabetic retinopathy; CSME clinically significant macular edema; DME diabetic macular edema; dbh dot blot hemorrhages; CWS cotton wool spot; POAG primary open angle glaucoma; C/D cup-to-disc ratio; HVF humphrey visual field; GVF goldmann visual field; OCT optical coherence tomography; IOP intraocular pressure; BRVO Branch retinal vein occlusion; CRVO central retinal vein occlusion; CRAO central retinal artery occlusion; BRAO branch retinal artery occlusion; RT retinal tear; SB scleral buckle; PPV pars plana vitrectomy; VH Vitreous hemorrhage; PRP panretinal laser photocoagulation; IVK intravitreal kenalog; VMT vitreomacular traction; MH Macular hole;  NVD neovascularization of the disc; NVE neovascularization elsewhere; AREDS age related eye disease study; ARMD age related macular degeneration; POAG primary open angle glaucoma; EBMD epithelial/anterior basement membrane dystrophy; ACIOL anterior chamber intraocular lens; IOL intraocular lens; PCIOL posterior chamber intraocular lens; Phaco/IOL phacoemulsification with intraocular lens placement; PRK photorefractive keratectomy; LASIK laser assisted in situ keratomileusis; HTN hypertension; DM diabetes mellitus; COPD chronic obstructive pulmonary disease

## 2024-05-20 ENCOUNTER — Ambulatory Visit (INDEPENDENT_AMBULATORY_CARE_PROVIDER_SITE_OTHER): Admitting: Ophthalmology

## 2024-05-20 ENCOUNTER — Encounter (INDEPENDENT_AMBULATORY_CARE_PROVIDER_SITE_OTHER): Payer: Self-pay | Admitting: Ophthalmology

## 2024-05-20 DIAGNOSIS — H35033 Hypertensive retinopathy, bilateral: Secondary | ICD-10-CM

## 2024-05-20 DIAGNOSIS — I10 Essential (primary) hypertension: Secondary | ICD-10-CM

## 2024-05-20 DIAGNOSIS — Z7985 Long-term (current) use of injectable non-insulin antidiabetic drugs: Secondary | ICD-10-CM | POA: Diagnosis not present

## 2024-05-20 DIAGNOSIS — E113313 Type 2 diabetes mellitus with moderate nonproliferative diabetic retinopathy with macular edema, bilateral: Secondary | ICD-10-CM | POA: Diagnosis not present

## 2024-05-20 DIAGNOSIS — Z794 Long term (current) use of insulin: Secondary | ICD-10-CM

## 2024-05-20 DIAGNOSIS — Z7984 Long term (current) use of oral hypoglycemic drugs: Secondary | ICD-10-CM

## 2024-05-20 DIAGNOSIS — H35373 Puckering of macula, bilateral: Secondary | ICD-10-CM

## 2024-05-20 DIAGNOSIS — Z961 Presence of intraocular lens: Secondary | ICD-10-CM | POA: Diagnosis not present

## 2024-05-20 MED ORDER — AFLIBERCEPT 2MG/0.05ML IZ SOLN FOR KALEIDOSCOPE
2.0000 mg | INTRAVITREAL | Status: AC | PRN
  Administered 2024-05-20: 2 mg via INTRAVITREAL

## 2024-05-28 DIAGNOSIS — E113313 Type 2 diabetes mellitus with moderate nonproliferative diabetic retinopathy with macular edema, bilateral: Secondary | ICD-10-CM | POA: Diagnosis not present

## 2024-07-03 NOTE — Progress Notes (Signed)
 [facility] - Clinic Note  07/15/2024    CHIEF COMPLAINT Patient presents for Retina Follow Up  HISTORY OF PRESENT ILLNESS: Megan Schroeder is a 79 y.o. female who presents to the clinic today for:   HPI     Retina Follow Up   Patient presents with  Diabetic Retinopathy.  In both eyes.  This started 8 weeks ago.  I, the attending physician,  performed the HPI with the patient and updated documentation appropriately.        Comments   Patient here for 8 weeks retina follow up for NPDR OU. Patient states vision about the same. Pt denies floaters/FOL. Pt had a stent put in artery last Wednesday.  BSL: 140 -this am  A1C: 7.1- 01/26      Last edited by Valdemar Rogue, MD on 07/15/2024 12:57 PM.     Pt  states a week ago she had a heart attack (70% blockage) and had a stent put in. She has not noticed any vision changes.  Referring physician: Hyacinth Honey, NP [address] [place],  [place] [address]  HISTORICAL INFORMATION:   Selected notes from the MEDICAL RECORD NUMBER Referred by Dr. Vicci   CURRENT MEDICATIONS: Current Outpatient Medications (Ophthalmic Drugs)  Medication Sig   Bromfenac  Sodium 0.07 % SOLN Place 1 drop into the right eye 4 (four) times daily. (Patient taking differently: Place 1 drop into the right eye 2 (two) times daily.)   prednisoLONE  acetate (PRED FORTE ) 1 % ophthalmic suspension INSTILL 1 DROP INTO RIGHT EYE 4 TIMES A DAY (Patient taking differently: Place 1 drop into the right eye 2 (two) times daily.)   No current facility-administered medications for this visit. (Ophthalmic Drugs)   Current Outpatient Medications (Other)  Medication Sig   aspirin  EC 81 MG tablet Take 1 tablet (81 mg total) by mouth daily. Swallow whole.   atorvastatin  (LIPITOR) 80 MG tablet Take 1 tablet (80 mg total) by mouth daily.   cholecalciferol  (VITAMIN D ) 1000 UNITS tablet Take 1,000 Units by mouth daily.    Coenzyme Q10 (COQ-10) 200 MG CAPS Take  200 mg by mouth daily.    Continuous Glucose Sensor (FREESTYLE LIBRE 3 PLUS SENSOR) MISC as directed.   dapagliflozin  propanediol (FARXIGA ) 10 MG TABS tablet Take 1 tablet (10 mg total) by mouth daily.   Dulaglutide (TRULICITY) 1.5 MG/0.5ML SOAJ Inject 1.5 mg into the skin once a week.   ezetimibe  (ZETIA ) 10 MG tablet Take 1 tablet (10 mg total) by mouth daily.   GNP ULTICARE PEN NEEDLES 31G X 5 MM MISC See admin instructions.   levothyroxine  (SYNTHROID ) 75 MCG tablet Take 1 tablet (75 mcg total) by mouth daily before breakfast.   metoprolol  succinate (TOPROL  XL) 25 MG 24 hr tablet Take 1 tablet (25 mg total) by mouth daily.   nitroGLYCERIN  (NITROSTAT ) 0.4 MG SL tablet Place 1 tablet (0.4 mg total) under the tongue every 5 (five) minutes as needed for chest pain.   ticagrelor  (BRILINTA ) 90 MG TABS tablet Take 1 tablet (90 mg total) by mouth 2 (two) times daily.   TOUJEO  SOLOSTAR 300 UNIT/ML Solostar Pen Inject 10 Units into the skin at bedtime. (Patient taking differently: Inject 50 Units into the skin at bedtime.)   No current facility-administered medications for this visit. (Other)   REVIEW OF SYSTEMS:    ALLERGIES Allergies  Allergen Reactions   Penicillins Hives        Hydrocodone  Nausea And Vomiting  Sulfa Antibiotics Rash   PAST MEDICAL HISTORY Past Medical History:  Diagnosis Date   Anxiety    CAD (coronary artery disease)    Diabetic retinopathy (HCC)    NPDR OU   Hyperlipidemia    Hypertension    Hypertensive retinopathy    OU   Hypothyroidism    Type 2 diabetes mellitus (HCC)    Past Surgical History:  Procedure Laterality Date   BREAST EXCISIONAL BIOPSY Left    CATARACT EXTRACTION W/PHACO Left 12/16/2017   Procedure: CATARACT EXTRACTION PHACO AND INTRAOCULAR LENS PLACEMENT (IOC);  Surgeon: Perley Hamilton, MD;  Location: [facility];  Service: Ophthalmology;  Laterality: Left;  CDE: 11.65   CATARACT EXTRACTION W/PHACO Right 12/30/2017   Procedure: CATARACT  EXTRACTION PHACO AND INTRAOCULAR LENS PLACEMENT RIGHT EYE;  Surgeon: Perley Hamilton, MD;  Location: [facility];  Service: Ophthalmology;  Laterality: Right;  CDE: 9.07   COLONOSCOPY N/A 09/15/2015   Dr. Tyra multiple rectal and colonic polyps removed. Hepatic flexure with 9 mm polyp, multiple 5-7 mm polyps. Multiple cecal polyps with largest 1.5 cm, one 5 mm polyp in rectum. Path for colonic polyps with sessile serrated polyp without dysplasia, no high grade dysplasia, tubular adenomas, rectal hyperplastic polyp   COLONOSCOPY WITH PROPOFOL  N/A 07/20/2019   multiple tubular adenomas. diverticulosis. 3 year surveillance   COLONOSCOPY WITH PROPOFOL  N/A 02/20/2023   Procedure: COLONOSCOPY WITH PROPOFOL ;  Surgeon: Shaaron Lamar HERO, MD;  Location: [facility];  Service: Endoscopy;  Laterality: N/A;  730am, asa 3   CORONARY/GRAFT ACUTE MI REVASCULARIZATION N/A 07/08/2024   Procedure: Coronary/Graft Acute MI Revascularization;  Surgeon: Ladona Heinz, MD;  Location: [facility] INVASIVE CV LAB;  Service: Cardiovascular;  Laterality: N/A;   EYE SURGERY Bilateral 2019   Cat Sx - Dr. Hamilton Perley   LEFT HEART CATH AND CORONARY ANGIOGRAPHY N/A 07/08/2024   Procedure: LEFT HEART CATH AND CORONARY ANGIOGRAPHY;  Surgeon: Ladona Heinz, MD;  Location: [facility] INVASIVE CV LAB;  Service: Cardiovascular;  Laterality: N/A;   LEFT HEART CATHETERIZATION WITH CORONARY ANGIOGRAM N/A 07/23/2011   Procedure: LEFT HEART CATHETERIZATION WITH CORONARY ANGIOGRAM;  Surgeon: Maude JAYSON Emmer, MD;  Location: [facility] CATH LAB;  Service: Cardiovascular;  Laterality: N/A;   POLYPECTOMY  07/20/2019   Procedure: POLYPECTOMY;  Surgeon: Shaaron Lamar HERO, MD;  Location: [facility];  Service: Endoscopy;;   POLYPECTOMY  02/20/2023   Procedure: POLYPECTOMY INTESTINAL;  Surgeon: Shaaron Lamar HERO, MD;  Location: [facility];  Service: Endoscopy;;   YAG LASER APPLICATION Right 09/01/2020   Dr. Redell Hans   YAG LASER APPLICATION Left 09/01/2020   Dr. Redell Hans   FAMILY  HISTORY Family History  Problem Relation Age of Onset   Cancer Father    Diabetes Father    Colon cancer Sister    SOCIAL HISTORY Social History   Tobacco Use   Smoking status: Never   Smokeless tobacco: Never  Vaping Use   Vaping status: Never Used  Substance Use Topics   Alcohol use: No   Drug use: No       OPHTHALMIC EXAM:  Base Eye Exam     Visual Acuity (Snellen - Linear)       Right Left   Dist [place] 20/30 20/20 -2   Dist ph [place] 20/25 -1          Tonometry (Tonopen, 8:03 AM)       Right Left   Pressure 17 15         Pupils  Pupils Dark Light Shape React APD   Right PERRL 3 2 Round Brisk None   Left PERRL 3 2 Round Brisk None         Visual Fields       Left Right    Full Full         Extraocular Movement       Right Left    Full, Ortho Full, Ortho         Neuro/Psych     Oriented x3: Yes   Mood/Affect: Normal         Dilation     Both eyes: 1.0% Mydriacyl, 2.5% Phenylephrine  @ 8:03 AM           Slit Lamp and Fundus Exam     Slit Lamp Exam       Right Left   Lids/Lashes Dermatochalasis - upper lid, mild MGD Dermatochalasis - upper lid, mild MGD   Conjunctiva/Sclera white and quiet white and quiet   Cornea 1-2+PEE, well healed temporal cataract wound 1+PEE, well healed temporal cataract wound   Anterior Chamber deep, clear, narrow temporal angle deep and clear   Iris round and dilated, No NVI round and dilated, No NVI   Lens PC IOL in good position, open PC PC IOL in good position with open PC   Anterior Vitreous syneresis, PVD syneresis, Posterior vitreous detachment, vitreous condensations         Fundus Exam       Right Left   Disc pink and sharp, compact, mild PPA pink and sharp, compact, mild PPP   C/D Ratio 0.3 0.1   Macula flat, blunted foveal reflex, ERM with striae inferiorly, punctate IRH - stably improved, +central cyst / edema, no exudates, no heme flat, blunted foveal reflex, mild ERM, rare MA,  no edema, focal pigment clumping temporal to fovea, no heme   Vessels attenuated, tortuous attenuated, tortuous   Periphery attached, rare MA / DBH attached, rare MA/DBH, no RT/RD 360           IMAGING AND PROCEDURES  Imaging and Procedures for 07/15/2024  OCT, Retina - OU - Both Eyes       Right Eye Quality was good. Central Foveal Thickness: 522. Progression has been stable. Findings include no SRF, abnormal foveal contour, epiretinal membrane, intraretinal fluid, lamellar hole, macular pucker (ERM with lamellar hole and pucker -- stable; mild persistent IRF and central cyst -- stable).   Left Eye Quality was good. Central Foveal Thickness: 253. Progression has been stable. Findings include normal foveal contour, no IRF, no SRF, epiretinal membrane, macular pucker (ERM with early pucker, partial PVD).   Notes *Images captured and stored on drive  Diagnosis / Impression:  OD: ERM with lamellar hole and pucker -- stable; mild persistent IRF and central cyst -- stable DME OS: ERM with early pucker, partial PVD  Clinical management:  See below  Abbreviations: NFP - Normal foveal profile. CME - cystoid macular edema. PED - pigment epithelial detachment. IRF - intraretinal fluid. SRF - subretinal fluid. EZ - ellipsoid zone. ERM - epiretinal membrane. ORA - outer retinal atrophy. ORT - outer retinal tubulation. SRHM - subretinal hyper-reflective material. IRHM - intraretinal hyper-reflective material      Intravitreal Injection, Pharmacologic Agent - OD - Right Eye       Time Out 07/15/2024. 8:23 AM. Confirmed correct patient, procedure, site, and patient consented.   Anesthesia Topical anesthesia was used. Anesthetic medications included Lidocaine  2%, Proparacaine 0.5%.   Procedure  Preparation included 5% betadine  to ocular surface, eyelid speculum. A (32g) needle was used.   Injection: 2 mg aflibercept  2 MG/0.05ML   Route: Intravitreal, Site: Right Eye   NDC: [id number],  Lot: [id number], Expiration date: 07/11/2025, Waste: 0 mL   Post-op Post injection exam found visual acuity of at least counting fingers. The patient tolerated the procedure well. There were no complications. The patient received written and verbal post procedure care education. Post injection medications were not given.             ASSESSMENT/PLAN:    ICD-10-CM   1. Moderate nonproliferative diabetic retinopathy of both eyes with macular edema associated with type 2 diabetes mellitus (HCC)  E11.3313 OCT, Retina - OU - Both Eyes    Intravitreal Injection, Pharmacologic Agent - OD - Right Eye    aflibercept  (EYLEA ) SOLN 2 mg    2. Current use of insulin  (HCC)  Z79.4     3. Long term (current) use of oral hypoglycemic drugs  Z79.84     4. Long-term (current) use of injectable non-insulin  antidiabetic drugs  Z79.85     5. Epiretinal membrane (ERM) of both eyes  H35.373     6. Essential hypertension  I10     7. Hypertensive retinopathy of both eyes  H35.033     8. Pseudophakia of both eyes  Z96.1      1-4. Moderate nonproliferative diabetic retinopathy OU - A1C 8.5 (01.28.26), 7.1 (July 2025), 7.8 (10.15.25) - BCVA OD: 20/25 from 20/30, OS: stable at 20/20 - s/p IVA OD #1 (02.16.23), #2 (03.20.23), #3 (04.17.23), #4 (05.22.23), #5 (06.22.23), #6 (07.27.23), #7 (09.01.23) #8 (10.06.23), #9 (11.09.23), #10 (12.15.23) #11 (01.12.24), #12 (02.09.24)  #13 (03.15.24) ================ - s/p IVE OD #1 (04.18.24), #2 (05.24.24), #3 (07.01.24), #4 (08.12.24), #5 (09.25.24), #6 (11.06.24), #7 (12.18.24), #8 (01.29.25), #9 (03.12.25), #10 (04.23.25), #11 (06.04.25), #12 (07.16.25),  #13 (08.27.25), #14 (10.15.25), #15 (12.10.25) - exam shows scattered MA, no NV - OCT shows OD: ERM with lamellar hole and pucker -- stable; mild persistent IRF and central cyst -- stable DME; OS: ERM with early pucker, partial PVD at 8 weeks - recommend IVE OD #16 today, 02.04.26 for DME component w/ f/u in 8  wks - pt wishes to proceed - RBA of procedure discussed, questions answered - IVE informed consent obtained and signed, 08.24.25 (OD)  - see procedure note  - Good Days reinstated 02.04.26 - f/u in 8 wks - DFE/OCT, possible injxn  5. Epiretinal membrane OU -- stable - OCT shows OD: ERM with pucker and prominent central cyst; persistent IRF and central cyst; OS: mild ERM with early pucker, partial PVD    - IRF OD: ?DME vs CME vs cystic changes - started PF and Prolensa  QID OD only for possible CME component on 12.8.22 -- continue, but suspect DME may be significant component  - no metamorphopsia  - monitor for now  - f/u 8 weeks -- DFE/OCT  6,7. Hypertensive retinopathy OU - discussed importance of tight BP control - monitor   8. Pseudophakia OU  - s/p CE/IOL OU, 2019 (Dr. Perley)  - IOLs in good position, doing well  - s/p YAG cap OD 3.24.22  - s/p YAG cap OS 04.07.22  - monitor  Ophthalmic Meds Ordered this visit:  Meds ordered this encounter  Medications   aflibercept  (EYLEA ) SOLN 2 mg     Return in about 8 weeks (around 09/09/2024) for f/u, NPDR, DFE, OCT, Possible, IVE, OD.  There are no Patient Instructions on file for this visit.  This document serves as a record of services personally performed by Redell JUDITHANN Hans, MD, PhD. It was created on their behalf by Almetta Pesa, an ophthalmic technician. The creation of this record is the provider's dictation and/or activities during the visit.    Electronically signed by: Almetta Pesa, OA, 07/16/24  4:10 PM  This document serves as a record of services personally performed by Redell JUDITHANN Hans, MD, PhD. It was created on their behalf by Nirali GEANNIE Keens, COT an ophthalmic technician. The creation of this record is the provider's dictation and/or activities during the visit.    Electronically signed by:  Jillana GEANNIE Keens, COT  07/16/24 4:10 PM  Redell JUDITHANN Hans, M.D., Ph.D. Diseases & Surgery of the Retina and  Vitreous [facility]  I have reviewed the above documentation for accuracy and completeness, and I agree with the above. Redell JUDITHANN Hans, M.D., Ph.D. 07/16/24 4:11 PM   Abbreviations: M myopia (nearsighted); A astigmatism; H hyperopia (farsighted); P presbyopia; Mrx spectacle prescription;  CTL contact lenses; OD right eye; OS left eye; OU both eyes  XT exotropia; ET esotropia; PEK punctate epithelial keratitis; PEE punctate epithelial erosions; DES dry eye syndrome; MGD meibomian gland dysfunction; ATs artificial tears; PFAT's preservative free artificial tears; NSC nuclear sclerotic cataract; PSC posterior subcapsular cataract; ERM epi-retinal membrane; PVD posterior vitreous detachment; RD retinal detachment; DM diabetes mellitus; DR diabetic retinopathy; NPDR non-proliferative diabetic retinopathy; PDR proliferative diabetic retinopathy; CSME clinically significant macular edema; DME diabetic macular edema; dbh dot blot hemorrhages; CWS cotton wool spot; POAG primary open angle glaucoma; C/D cup-to-disc ratio; HVF humphrey visual field; GVF goldmann visual field; OCT optical coherence tomography; IOP intraocular pressure; BRVO Branch retinal vein occlusion; CRVO central retinal vein occlusion; CRAO central retinal artery occlusion; BRAO branch retinal artery occlusion; RT retinal tear; SB scleral buckle; PPV pars plana vitrectomy; VH Vitreous hemorrhage; PRP panretinal laser photocoagulation; IVK intravitreal kenalog; VMT vitreomacular traction; MH Macular hole;  NVD neovascularization of the disc; NVE neovascularization elsewhere; AREDS age related eye disease study; ARMD age related macular degeneration; POAG primary open angle glaucoma; EBMD epithelial/anterior basement membrane dystrophy; ACIOL anterior chamber intraocular lens; IOL intraocular lens; PCIOL posterior chamber intraocular lens; Phaco/IOL phacoemulsification with intraocular lens placement; PRK photorefractive  keratectomy; LASIK laser assisted in situ keratomileusis; HTN hypertension; DM diabetes mellitus; COPD chronic obstructive pulmonary disease

## 2024-07-08 ENCOUNTER — Inpatient Hospital Stay (HOSPITAL_COMMUNITY)

## 2024-07-08 ENCOUNTER — Encounter (HOSPITAL_COMMUNITY)
Admission: EM | Disposition: A | Discharge status: Home / Self Care | Payer: Self-pay | Source: Home / Self Care | Attending: Cardiology

## 2024-07-08 ENCOUNTER — Emergency Department (HOSPITAL_COMMUNITY)

## 2024-07-08 ENCOUNTER — Inpatient Hospital Stay (HOSPITAL_COMMUNITY)
Admission: EM | Admit: 2024-07-08 | Discharge: 2024-07-10 | DRG: 321 | Disposition: A | Discharge status: Home / Self Care | Source: Home / Self Care | Attending: Cardiology | Admitting: Cardiology

## 2024-07-08 DIAGNOSIS — Z880 Allergy status to penicillin: Secondary | ICD-10-CM

## 2024-07-08 DIAGNOSIS — Z794 Long term (current) use of insulin: Secondary | ICD-10-CM | POA: Diagnosis not present

## 2024-07-08 DIAGNOSIS — Z7982 Long term (current) use of aspirin: Secondary | ICD-10-CM

## 2024-07-08 DIAGNOSIS — R570 Cardiogenic shock: Secondary | ICD-10-CM | POA: Diagnosis present

## 2024-07-08 DIAGNOSIS — I2510 Atherosclerotic heart disease of native coronary artery without angina pectoris: Secondary | ICD-10-CM | POA: Diagnosis present

## 2024-07-08 DIAGNOSIS — Z882 Allergy status to sulfonamides status: Secondary | ICD-10-CM

## 2024-07-08 DIAGNOSIS — I2111 ST elevation (STEMI) myocardial infarction involving right coronary artery: Secondary | ICD-10-CM | POA: Diagnosis not present

## 2024-07-08 DIAGNOSIS — R001 Bradycardia, unspecified: Secondary | ICD-10-CM | POA: Diagnosis present

## 2024-07-08 DIAGNOSIS — E785 Hyperlipidemia, unspecified: Secondary | ICD-10-CM | POA: Diagnosis present

## 2024-07-08 DIAGNOSIS — F419 Anxiety disorder, unspecified: Secondary | ICD-10-CM | POA: Diagnosis present

## 2024-07-08 DIAGNOSIS — K219 Gastro-esophageal reflux disease without esophagitis: Secondary | ICD-10-CM | POA: Diagnosis present

## 2024-07-08 DIAGNOSIS — I10 Essential (primary) hypertension: Secondary | ICD-10-CM | POA: Diagnosis present

## 2024-07-08 DIAGNOSIS — E113293 Type 2 diabetes mellitus with mild nonproliferative diabetic retinopathy without macular edema, bilateral: Secondary | ICD-10-CM | POA: Diagnosis present

## 2024-07-08 DIAGNOSIS — Z79890 Hormone replacement therapy (postmenopausal): Secondary | ICD-10-CM | POA: Diagnosis not present

## 2024-07-08 DIAGNOSIS — Z7985 Long-term (current) use of injectable non-insulin antidiabetic drugs: Secondary | ICD-10-CM | POA: Diagnosis not present

## 2024-07-08 DIAGNOSIS — Z800 Family history of malignant neoplasm of digestive organs: Secondary | ICD-10-CM | POA: Diagnosis not present

## 2024-07-08 DIAGNOSIS — Z885 Allergy status to narcotic agent status: Secondary | ICD-10-CM

## 2024-07-08 DIAGNOSIS — R578 Other shock: Secondary | ICD-10-CM

## 2024-07-08 DIAGNOSIS — Z833 Family history of diabetes mellitus: Secondary | ICD-10-CM | POA: Diagnosis not present

## 2024-07-08 DIAGNOSIS — E039 Hypothyroidism, unspecified: Secondary | ICD-10-CM | POA: Diagnosis present

## 2024-07-08 DIAGNOSIS — Z79899 Other long term (current) drug therapy: Secondary | ICD-10-CM

## 2024-07-08 DIAGNOSIS — Z955 Presence of coronary angioplasty implant and graft: Secondary | ICD-10-CM

## 2024-07-08 DIAGNOSIS — E7800 Pure hypercholesterolemia, unspecified: Secondary | ICD-10-CM | POA: Diagnosis present

## 2024-07-08 DIAGNOSIS — Z860100 Personal history of colon polyps, unspecified: Secondary | ICD-10-CM | POA: Diagnosis not present

## 2024-07-08 DIAGNOSIS — I2119 ST elevation (STEMI) myocardial infarction involving other coronary artery of inferior wall: Principal | ICD-10-CM | POA: Diagnosis present

## 2024-07-08 DIAGNOSIS — E119 Type 2 diabetes mellitus without complications: Secondary | ICD-10-CM

## 2024-07-08 LAB — CBC WITH DIFFERENTIAL/PLATELET
Abs Immature Granulocytes: 0.03 10*3/uL (ref 0.00–0.07)
Basophils Absolute: 0.1 10*3/uL (ref 0.0–0.1)
Basophils Relative: 1 %
Eosinophils Absolute: 0.1 10*3/uL (ref 0.0–0.5)
Eosinophils Relative: 1 %
HCT: 40.8 % (ref 36.0–46.0)
Hemoglobin: 13.4 g/dL (ref 12.0–15.0)
Immature Granulocytes: 0 %
Lymphocytes Relative: 25 %
Lymphs Abs: 2.5 10*3/uL (ref 0.7–4.0)
MCH: 28 pg (ref 26.0–34.0)
MCHC: 32.8 g/dL (ref 30.0–36.0)
MCV: 85.2 fL (ref 80.0–100.0)
Monocytes Absolute: 0.8 10*3/uL (ref 0.1–1.0)
Monocytes Relative: 8 %
Neutro Abs: 6.5 10*3/uL (ref 1.7–7.7)
Neutrophils Relative %: 65 %
Platelets: 192 10*3/uL (ref 150–400)
RBC: 4.79 MIL/uL (ref 3.87–5.11)
RDW: 14.1 % (ref 11.5–15.5)
WBC: 9.9 10*3/uL (ref 4.0–10.5)
nRBC: 0 % (ref 0.0–0.2)

## 2024-07-08 LAB — LIPID PANEL
Cholesterol: 172 mg/dL (ref 0–200)
HDL: 58 mg/dL
LDL Cholesterol: 80 mg/dL (ref 0–99)
Total CHOL/HDL Ratio: 3 ratio
Triglycerides: 167 mg/dL — ABNORMAL HIGH
VLDL: 33 mg/dL (ref 0–40)

## 2024-07-08 LAB — POCT ACTIVATED CLOTTING TIME
Activated Clotting Time: 296 s
Activated Clotting Time: 342 s

## 2024-07-08 LAB — HEMOGLOBIN A1C
Hgb A1c MFr Bld: 8.5 % — ABNORMAL HIGH (ref 4.8–5.6)
Mean Plasma Glucose: 197.25 mg/dL

## 2024-07-08 LAB — CG4 I-STAT (LACTIC ACID)
Lactic Acid, Venous: 0.7 mmol/L (ref 0.5–1.9)
Lactic Acid, Venous: 1.7 mmol/L (ref 0.5–1.9)
Lactic Acid, Venous: 0.7 mmol/L (ref 0.5–1.9)

## 2024-07-08 LAB — CBC
HCT: 36.1 % (ref 36.0–46.0)
Hemoglobin: 12 g/dL (ref 12.0–15.0)
MCH: 28.2 pg (ref 26.0–34.0)
MCHC: 33.2 g/dL (ref 30.0–36.0)
MCV: 84.9 fL (ref 80.0–100.0)
Platelets: 182 10*3/uL (ref 150–400)
RBC: 4.25 MIL/uL (ref 3.87–5.11)
RDW: 14.2 % (ref 11.5–15.5)
WBC: 13.3 10*3/uL — ABNORMAL HIGH (ref 4.0–10.5)
nRBC: 0 % (ref 0.0–0.2)

## 2024-07-08 LAB — BASIC METABOLIC PANEL WITH GFR
Anion gap: 17 — ABNORMAL HIGH (ref 5–15)
BUN: 14 mg/dL (ref 8–23)
CO2: 22 mmol/L (ref 22–32)
Calcium: 9.8 mg/dL (ref 8.9–10.3)
Chloride: 98 mmol/L (ref 98–111)
Creatinine, Ser: 0.98 mg/dL (ref 0.44–1.00)
GFR, Estimated: 60 mL/min — ABNORMAL LOW
Glucose, Bld: 256 mg/dL — ABNORMAL HIGH (ref 70–99)
Potassium: 3.8 mmol/L (ref 3.5–5.1)
Sodium: 136 mmol/L (ref 135–145)

## 2024-07-08 LAB — ECHOCARDIOGRAM LIMITED: Weight: 2448 [oz_av]

## 2024-07-08 LAB — PROTIME-INR
INR: 1.1 (ref 0.8–1.2)
Prothrombin Time: 15 s (ref 11.4–15.2)

## 2024-07-08 LAB — ECHOCARDIOGRAM COMPLETE
Area-P 1/2: 3.37 cm2
S' Lateral: 2.6 cm
Weight: 2448 [oz_av]

## 2024-07-08 LAB — TROPONIN T, HIGH SENSITIVITY: Troponin T High Sensitivity: 506 ng/L (ref 0–19)

## 2024-07-08 LAB — APTT: aPTT: 33 s (ref 24–36)

## 2024-07-08 LAB — LACTIC ACID, PLASMA: Lactic Acid, Venous: 1.7 mmol/L (ref 0.5–1.9)

## 2024-07-08 LAB — MRSA NEXT GEN BY PCR, NASAL: MRSA by PCR Next Gen: DETECTED — AB

## 2024-07-08 LAB — GLUCOSE, CAPILLARY
Glucose-Capillary: 181 mg/dL — ABNORMAL HIGH (ref 70–99)
Glucose-Capillary: 175 mg/dL — ABNORMAL HIGH (ref 70–99)

## 2024-07-08 MED ORDER — LEVOTHYROXINE SODIUM 75 MCG PO TABS
75.0000 ug | ORAL_TABLET | Freq: Every day | ORAL | Status: DC
  Administered 2024-07-09 – 2024-07-10 (×2): 75 ug via ORAL
  Filled 2024-07-08 (×2): qty 1

## 2024-07-08 MED ORDER — HEPARIN SODIUM (PORCINE) 5000 UNIT/ML IJ SOLN: 3900.0000 [IU] | Freq: Once | INTRAMUSCULAR | Status: DC

## 2024-07-08 MED ORDER — MORPHINE SULFATE (PF) 2 MG/ML IV SOLN
1.0000 mg | INTRAVENOUS | Status: DC | PRN
  Filled 2024-07-08: qty 1

## 2024-07-08 MED ORDER — INSULIN ASPART 100 UNIT/ML IJ SOLN: 0.0000 [IU] | Freq: Every day | INTRAMUSCULAR | Status: DC

## 2024-07-08 MED ORDER — SODIUM CHLORIDE 0.9% FLUSH: 3.0000 mL | INTRAVENOUS | Status: DC | PRN

## 2024-07-08 MED ORDER — VERAPAMIL HCL 2.5 MG/ML IV SOLN
INTRAVENOUS | Status: AC
  Filled 2024-07-08: qty 2

## 2024-07-08 MED ORDER — FENTANYL CITRATE (PF) 50 MCG/ML IJ SOSY: 50.0000 ug | PREFILLED_SYRINGE | Freq: Once | INTRAMUSCULAR | Status: AC

## 2024-07-08 MED ORDER — SODIUM CHLORIDE 0.9 % IV SOLN
250.0000 mL | INTRAVENOUS | Status: AC
  Administered 2024-07-08: 250 mL via INTRAVENOUS

## 2024-07-08 MED ORDER — IOHEXOL 350 MG/ML SOLN
INTRAVENOUS | Status: DC | PRN
  Administered 2024-07-08: 100 mL via INTRA_ARTERIAL

## 2024-07-08 MED ORDER — SODIUM CHLORIDE 0.9 % WEIGHT BASED INFUSION
1.0000 mL/kg/h | INTRAVENOUS | Status: AC
  Administered 2024-07-08: 1 mL/kg/h via INTRAVENOUS

## 2024-07-08 MED ORDER — NITROGLYCERIN 0.4 MG SL SUBL
0.4000 mg | SUBLINGUAL_TABLET | SUBLINGUAL | Status: DC | PRN
  Administered 2024-07-08 (×2): 0.4 mg via SUBLINGUAL
  Filled 2024-07-08: qty 1

## 2024-07-08 MED ORDER — ASPIRIN 81 MG PO CHEW
324.0000 mg | CHEWABLE_TABLET | Freq: Once | ORAL | Status: AC
  Administered 2024-07-08: 324 mg via ORAL
  Filled 2024-07-08: qty 4

## 2024-07-08 MED ORDER — PREDNISOLONE ACETATE 1 % OP SUSP
1.0000 [drp] | Freq: Four times a day (QID) | OPHTHALMIC | Status: DC
  Administered 2024-07-08 (×3): 1 [drp] via OPHTHALMIC
  Filled 2024-07-08: qty 5

## 2024-07-08 MED ORDER — MUPIROCIN 2 % EX OINT
1.0000 | TOPICAL_OINTMENT | Freq: Two times a day (BID) | CUTANEOUS | Status: DC
  Administered 2024-07-09 (×3): 1 via NASAL
  Filled 2024-07-08: qty 22

## 2024-07-08 MED ORDER — METOPROLOL SUCCINATE ER 25 MG PO TB24: 25.0000 mg | ORAL_TABLET | Freq: Every day | ORAL | Status: DC

## 2024-07-08 MED ORDER — TICAGRELOR 90 MG PO TABS
ORAL_TABLET | ORAL | Status: AC
  Filled 2024-07-08: qty 1

## 2024-07-08 MED ORDER — VERAPAMIL HCL 2.5 MG/ML IV SOLN
INTRAVENOUS | Status: DC | PRN
  Administered 2024-07-08: 10 mL via INTRA_ARTERIAL

## 2024-07-08 MED ORDER — TICAGRELOR 90 MG PO TABS
ORAL_TABLET | ORAL | Status: AC
  Filled 2024-07-08: qty 2

## 2024-07-08 MED ORDER — CHLORHEXIDINE GLUCONATE CLOTH 2 % EX PADS
6.0000 | MEDICATED_PAD | Freq: Every day | CUTANEOUS | Status: DC
  Administered 2024-07-08: 6 via TOPICAL

## 2024-07-08 MED ORDER — FAMOTIDINE IN NACL 20-0.9 MG/50ML-% IV SOLN
INTRAVENOUS | Status: AC
  Filled 2024-07-08: qty 50

## 2024-07-08 MED ORDER — ASPIRIN 81 MG PO CHEW: 81.0000 mg | CHEWABLE_TABLET | Freq: Every day | ORAL | Status: DC

## 2024-07-08 MED ORDER — LIDOCAINE HCL (PF) 1 % IJ SOLN
INTRAMUSCULAR | Status: AC
  Filled 2024-07-08: qty 30

## 2024-07-08 MED ORDER — SODIUM CHLORIDE 0.9% FLUSH
3.0000 mL | Freq: Two times a day (BID) | INTRAVENOUS | Status: DC
  Administered 2024-07-09 (×3): 3 mL via INTRAVENOUS

## 2024-07-08 MED ORDER — TICAGRELOR 90 MG PO TABS
ORAL_TABLET | ORAL | Status: DC | PRN
  Administered 2024-07-08: 180 mg via ORAL

## 2024-07-08 MED ORDER — ATORVASTATIN CALCIUM 80 MG PO TABS
80.0000 mg | ORAL_TABLET | Freq: Every day | ORAL | Status: DC
  Administered 2024-07-08 – 2024-07-10 (×3): 80 mg via ORAL
  Filled 2024-07-08 (×3): qty 1

## 2024-07-08 MED ORDER — LACTATED RINGERS IV BOLUS
500.0000 mL | Freq: Once | INTRAVENOUS | Status: AC
  Administered 2024-07-08: 500 mL via INTRAVENOUS

## 2024-07-08 MED ORDER — ACETAMINOPHEN 325 MG PO TABS: 650.0000 mg | ORAL_TABLET | ORAL | Status: DC | PRN

## 2024-07-08 MED ORDER — INSULIN GLARGINE 100 UNIT/ML ~~LOC~~ SOLN
10.0000 [IU] | Freq: Every day | SUBCUTANEOUS | Status: DC
  Administered 2024-07-08 – 2024-07-09 (×2): 10 [IU] via SUBCUTANEOUS
  Filled 2024-07-08 (×3): qty 0.1

## 2024-07-08 MED ORDER — ALUM & MAG HYDROXIDE-SIMETH 200-200-20 MG/5ML PO SUSP
30.0000 mL | ORAL | Status: DC | PRN
  Administered 2024-07-08: 30 mL via ORAL
  Filled 2024-07-08: qty 30

## 2024-07-08 MED ORDER — NOREPINEPHRINE BITARTRATE 1 MG/ML IV SOLN
INTRAVENOUS | Status: DC | PRN
  Administered 2024-07-08: 15 ug/min via INTRAVENOUS

## 2024-07-08 MED ORDER — FAMOTIDINE IN NACL 20-0.9 MG/50ML-% IV SOLN
INTRAVENOUS | Status: DC | PRN
  Administered 2024-07-08: 20 mg via INTRAVENOUS

## 2024-07-08 MED ORDER — SODIUM CHLORIDE 0.9 % IV SOLN: 250.0000 mL | INTRAVENOUS | Status: AC | PRN

## 2024-07-08 MED ORDER — NITROGLYCERIN IN D5W 200-5 MCG/ML-% IV SOLN
INTRAVENOUS | Status: AC
  Filled 2024-07-08: qty 250

## 2024-07-08 MED ORDER — INSULIN ASPART 100 UNIT/ML IJ SOLN
0.0000 [IU] | Freq: Three times a day (TID) | INTRAMUSCULAR | Status: DC
  Administered 2024-07-08: 3 [IU] via SUBCUTANEOUS
  Administered 2024-07-09: 8 [IU] via SUBCUTANEOUS
  Administered 2024-07-09 – 2024-07-10 (×2): 3 [IU] via SUBCUTANEOUS
  Filled 2024-07-08: qty 3
  Filled 2024-07-08: qty 8
  Filled 2024-07-08 (×2): qty 3

## 2024-07-08 MED ORDER — ONDANSETRON HCL 4 MG/2ML IJ SOLN
4.0000 mg | Freq: Four times a day (QID) | INTRAMUSCULAR | Status: DC | PRN
  Administered 2024-07-08: 4 mg via INTRAVENOUS
  Filled 2024-07-08: qty 2

## 2024-07-08 MED ORDER — NOREPINEPHRINE 4 MG/250ML-% IV SOLN: 0.0000 ug/min | INTRAVENOUS | Status: DC

## 2024-07-08 MED ORDER — LIDOCAINE HCL (PF) 1 % IJ SOLN
INTRAMUSCULAR | Status: DC | PRN
  Administered 2024-07-08: 2 mL

## 2024-07-08 MED ORDER — SODIUM CHLORIDE 0.9 % IV BOLUS
INTRAVENOUS | Status: DC | PRN
  Administered 2024-07-08: 500 mL via INTRAVENOUS
  Administered 2024-07-08: 300 mL/h via INTRAVENOUS

## 2024-07-08 MED ORDER — HEPARIN SODIUM (PORCINE) 5000 UNIT/ML IJ SOLN
4000.0000 [IU] | Freq: Once | INTRAMUSCULAR | Status: AC
  Administered 2024-07-08: 4000 [IU] via INTRAVENOUS
  Filled 2024-07-08: qty 1

## 2024-07-08 MED ORDER — ORAL CARE MOUTH RINSE: 15.0000 mL | OROMUCOSAL | Status: DC | PRN

## 2024-07-08 MED ORDER — FENTANYL CITRATE (PF) 50 MCG/ML IJ SOSY
PREFILLED_SYRINGE | INTRAMUSCULAR | Status: AC
  Administered 2024-07-08: 50 ug via INTRAVENOUS
  Filled 2024-07-08: qty 1

## 2024-07-08 MED ORDER — HEPARIN (PORCINE) IN NACL 1000-0.9 UT/500ML-% IV SOLN
INTRAVENOUS | Status: DC | PRN
  Administered 2024-07-08: 1000 mL via SURGICAL_CAVITY

## 2024-07-08 MED ORDER — TICAGRELOR 90 MG PO TABS
90.0000 mg | ORAL_TABLET | Freq: Two times a day (BID) | ORAL | Status: DC
  Administered 2024-07-08 – 2024-07-10 (×4): 90 mg via ORAL
  Filled 2024-07-08 (×4): qty 1

## 2024-07-08 MED ORDER — SODIUM CHLORIDE 0.9 % IV SOLN: INTRAVENOUS | Status: DC

## 2024-07-08 MED ORDER — HEPARIN SODIUM (PORCINE) 1000 UNIT/ML IJ SOLN
INTRAMUSCULAR | Status: DC | PRN
  Administered 2024-07-08: 9000 [IU] via INTRAVENOUS

## 2024-07-08 MED ORDER — HEPARIN SODIUM (PORCINE) 1000 UNIT/ML IJ SOLN
INTRAMUSCULAR | Status: AC
  Filled 2024-07-08: qty 10

## 2024-07-08 MED ORDER — HEPARIN SODIUM (PORCINE) 5000 UNIT/ML IJ SOLN
5000.0000 [IU] | Freq: Three times a day (TID) | INTRAMUSCULAR | Status: DC
  Administered 2024-07-08 – 2024-07-10 (×5): 5000 [IU] via SUBCUTANEOUS
  Filled 2024-07-08 (×5): qty 1

## 2024-07-08 MED ORDER — SODIUM CHLORIDE 0.9 % IV SOLN
INTRAVENOUS | Status: DC | PRN
  Administered 2024-07-08: 75 mL/h via INTRAVENOUS

## 2024-07-08 MED ORDER — NOREPINEPHRINE 4 MG/250ML-% IV SOLN
INTRAVENOUS | Status: AC
  Filled 2024-07-08: qty 250

## 2024-07-08 MED ORDER — ASPIRIN 81 MG PO TBEC: 81.0000 mg | DELAYED_RELEASE_TABLET | Freq: Every day | ORAL | Status: DC | PRN

## 2024-07-08 NOTE — Consult Note (Signed)
 "   Advanced Heart Failure Team Consult Note   Primary Physician: Joeann Browning, FNP Cardiologist:  Jayson Sierras, MD HPI:    Megan Schroeder is seen today for evaluation of shock  at the request of Dr Margaretann.   Megan Schroeder is a 79 y.o. female with CAD known moderate distal stenosis, HLD, and HTN.  Presented with chest pain and chest heaviness. EKG with ST elevation inferior leads. Code STEMI activated.  Taken urgently to cath lab. LM mildly calcified,  DES to RCA, mid LAD 85% stenosis. Lactic acid 1.7, HS Trop 506,  WBC 9.9. Admitted to 2H post cath. Developed chest pain and hypotension.  Dr Margaretann at bedside. Started on Norepi. And given IV fluids.  Stat Echo performed. LV/RV ok. No pericardial effusion.   Home Medications Prior to Admission medications  Medication Sig Start Date End Date Taking? Authorizing Provider  amLODipine  (NORVASC ) 10 MG tablet Take 1 tablet (10 mg total) by mouth daily. For BP 05/17/20   Pearlean Manus, MD  aspirin  EC 81 MG tablet Take 1 tablet (81 mg total) by mouth daily as needed for mild pain (pain score 1-3). 06/26/23   Johnson, Clanford L, MD  atorvastatin  (LIPITOR) 80 MG tablet Take 1 tablet (80 mg total) by mouth daily. 10/07/23   Sierras Jayson MATSU, MD  Bromfenac  Sodium 0.07 % SOLN Place 1 drop into the right eye 4 (four) times daily. 07/19/23   Valdemar Rogue, MD  cholecalciferol  (VITAMIN D ) 1000 UNITS tablet Take 1,000 Units by mouth daily.     [provider]  Coenzyme Q10 (COQ-10) 200 MG CAPS Take 200 mg by mouth daily.     [provider]  Continuous Glucose Sensor (FREESTYLE LIBRE 3 PLUS SENSOR) MISC as directed. 09/11/23   [provider]  dapagliflozin  propanediol (FARXIGA ) 10 MG TABS tablet Take 1 tablet (10 mg total) by mouth daily. 05/17/20   Pearlean Manus, MD  Dulaglutide (TRULICITY) 4.5 MG/0.5ML SOPN Inject 4.5 mg into the skin every Sunday.    [provider]  ezetimibe  (ZETIA ) 10 MG tablet Take 10 mg  by mouth daily. 08/19/23   [provider]  GNP ULTICARE PEN NEEDLES 31G X 5 MM MISC See admin instructions. 09/07/20   [provider]  levothyroxine  (SYNTHROID ) 75 MCG tablet Take 1 tablet (75 mcg total) by mouth daily before breakfast. 05/17/20   Pearlean, Courage, MD  prednisoLONE  acetate (PRED FORTE ) 1 % ophthalmic suspension INSTILL 1 DROP INTO RIGHT EYE 4 TIMES A DAY 03/16/24   Valdemar Rogue, MD  TOUJEO  SOLOSTAR 300 UNIT/ML Solostar Pen Inject 10 Units into the skin at bedtime. 06/26/23   Vicci Afton CROME, MD    Past Medical History: Past Medical History:  Diagnosis Date   Anxiety    CAD (coronary artery disease)    Diabetic retinopathy (HCC)    NPDR OU   Hyperlipidemia    Hypertension    Hypertensive retinopathy    OU   Hypothyroidism    Type 2 diabetes mellitus (HCC)     Past Surgical History: Past Surgical History:  Procedure Laterality Date   BREAST EXCISIONAL BIOPSY Left    CATARACT EXTRACTION W/PHACO Left 12/16/2017   Procedure: CATARACT EXTRACTION PHACO AND INTRAOCULAR LENS PLACEMENT (IOC);  Surgeon: Perley Hamilton, MD;  Location: [facility];  Service: Ophthalmology;  Laterality: Left;  CDE: 11.65   CATARACT EXTRACTION W/PHACO Right 12/30/2017   Procedure: CATARACT EXTRACTION PHACO AND INTRAOCULAR LENS PLACEMENT RIGHT EYE;  Surgeon: Perley Hamilton,  MD;  Location: [facility];  Service: Ophthalmology;  Laterality: Right;  CDE: 9.07   COLONOSCOPY N/A 09/15/2015   Dr. Tyra multiple rectal and colonic polyps removed. Hepatic flexure with 9 mm polyp, multiple 5-7 mm polyps. Multiple cecal polyps with largest 1.5 cm, one 5 mm polyp in rectum. Path for colonic polyps with sessile serrated polyp without dysplasia, no high grade dysplasia, tubular adenomas, rectal hyperplastic polyp   COLONOSCOPY WITH PROPOFOL  N/A 07/20/2019   multiple tubular adenomas. diverticulosis. 3 year surveillance   COLONOSCOPY WITH PROPOFOL  N/A 02/20/2023   Procedure: COLONOSCOPY WITH PROPOFOL ;   Surgeon: Shaaron Lamar HERO, MD;  Location: [facility];  Service: Endoscopy;  Laterality: N/A;  730am, asa 3   EYE SURGERY Bilateral 2019   Cat Sx - Dr. Cherene Mania   LEFT HEART CATHETERIZATION WITH CORONARY ANGIOGRAM N/A 07/23/2011   Procedure: LEFT HEART CATHETERIZATION WITH CORONARY ANGIOGRAM;  Surgeon: Maude JAYSON Emmer, MD;  Location: [facility];  Service: Cardiovascular;  Laterality: N/A;   POLYPECTOMY  07/20/2019   Procedure: POLYPECTOMY;  Surgeon: Shaaron Lamar HERO, MD;  Location: [facility];  Service: Endoscopy;;   POLYPECTOMY  02/20/2023   Procedure: POLYPECTOMY INTESTINAL;  Surgeon: Shaaron Lamar HERO, MD;  Location: [facility];  Service: Endoscopy;;   YAG LASER APPLICATION Right 09/01/2020   Dr. Redell Hans   YAG LASER APPLICATION Left 09/01/2020   Dr. Redell Hans    Family History: Family History  Problem Relation Age of Onset   Cancer Father    Diabetes Father    Colon cancer Sister     Social History: Social History   Socioeconomic History   Marital status: Widowed    Spouse name: Not on file   Number of children: Not on file   Years of education: Not on file   Highest education level: Not on file  Occupational History   Not on file  Tobacco Use   Smoking status: Never   Smokeless tobacco: Never  Vaping Use   Vaping status: Never Used  Substance and Sexual Activity   Alcohol use: No   Drug use: No   Sexual activity: Yes    Birth control/protection: Post-menopausal  Other Topics Concern   Not on file  Social History Narrative   Not on file   Social Drivers of Health   Tobacco Use: Low Risk (05/20/2024)   Patient History    Smoking Tobacco Use: Never    Smokeless Tobacco Use: Never    Passive Exposure: Not on file  Financial Resource Strain: Not on file  Food Insecurity: No Food Insecurity (06/27/2023)   Hunger Vital Sign    Worried About Running Out of Food in the Last Year: Never true    Ran Out of Food in the Last Year: Never true   Transportation Needs: No Transportation Needs (06/27/2023)   PRAPARE - Administrator, Civil Service (Medical): No    Lack of Transportation (Non-Medical): No  Physical Activity: Not on file  Stress: Not on file  Social Connections: Socially Integrated (06/27/2023)   Social Connection and Isolation Panel    Frequency of Communication with Friends and Family: More than three times a week    Frequency of Social Gatherings with Friends and Family: More than three times a week    Attends Religious Services: 1 to 4 times per year    Active Member of [employer] or Organizations: Yes    Attends Banker Meetings: 1 to 4 times  per year    Marital Status: Married  Depression (EYV7-0): Not on file  Alcohol Screen: Not on file  Housing: Low Risk (06/27/2023)   Housing Stability Vital Sign    Unable to Pay for Housing in the Last Year: No    Number of Times Moved in the Last Year: 0    Homeless in the Last Year: No  Utilities: Not At Risk (06/27/2023)   AHC Utilities    Threatened with loss of utilities: No  Health Literacy: Not on file    Allergies:  Allergies[1]  Objective:   Vital Signs:   Temp:  [97.8 F (36.6 C)] 97.8 F (36.6 C) (01/28 1625) Pulse Rate:  [0-98] 64 (01/28 1615) Resp:  [13-37] 15 (01/28 1615) BP: (83-126)/(38-91) 97/47 (01/28 1615) SpO2:  [95 %-100 %] 100 % (01/28 1615) Weight:  [65 kg-69.4 kg] 69.4 kg (01/28 1210)    Weight change: Filed Weights   07/08/24 1201 07/08/24 1210  Weight: 65 kg 69.4 kg    Intake/Output:   Intake/Output Summary (Last 24 hours) at 07/08/2024 1646 Last data filed at 07/08/2024 1515 Gross per 24 hour  Intake 1250.87 ml  Output --  Net 1250.87 ml    Physical Exam  General:  Appears weak.   Cor: Regular rate & rhythm. No murmurs. JVD flat.  Lungs: clear Extremities: no edema   Telemetry   SR  EKG    SR, ST elevation inferior leads.   Labs   Basic Metabolic Panel: Recent Labs  Lab 07/08/24 1208   NA 136  K 3.8  CL 98  CO2 22  GLUCOSE 256*  BUN 14  CREATININE 0.98  CALCIUM  9.8    Liver Function Tests: No results for input(s): AST, ALT, ALKPHOS, BILITOT, PROT, ALBUMIN in the last 168 hours. No results for input(s): LIPASE, AMYLASE in the last 168 hours. No results for input(s): AMMONIA in the last 168 hours.  CBC: Recent Labs  Lab 07/08/24 1217 07/08/24 1614  WBC 9.9 13.3*  NEUTROABS 6.5  --   HGB 13.4 12.0  HCT 40.8 36.1  MCV 85.2 84.9  PLT 192 182    Cardiac Enzymes: No results for input(s): CKTOTAL, CKMB, CKMBINDEX, TROPONINI in the last 168 hours.  BNP: BNP (last 3 results) No results for input(s): BNP in the last 8760 hours.  ProBNP (last 3 results) No results for input(s): PROBNP in the last 8760 hours.   CBG: Recent Labs  Lab 07/08/24 1614  GLUCAP 181*    Coagulation Studies: Recent Labs    07/08/24 July 19, 1206  LABPROT 15.0  INR 1.1     Imaging   ECHOCARDIOGRAM LIMITED Result Date: 07/08/2024    ECHOCARDIOGRAM LIMITED REPORT   Patient Name:   Megan Schroeder Date of Exam: 07/08/2024 Medical Rec #:  [id number]       Height:       63.0 in Accession #:    [id number]      Weight:       153.0 lb Date of Birth:  09/16/1945      BSA:          1.726 m Patient Age:    78 years        BP:           126/91 mmHg Patient Gender: F               HR:           50 bpm. Exam Location:  Inpatient Procedure:  Limited Echo (Both Spectral and Color Flow Doppler were utilized            during procedure). STAT ECHO Indications:    cad of native vessel  History:        Patient has prior history of Echocardiogram examinations, most                 recent 05/17/2020. CAD, Signs/Symptoms:Chest Pain; Risk                 Factors:Diabetes, Hypertension and Dyslipidemia.  Sonographer:    Tinnie Barefoot RDCS Referring Phys: [id number] GORDY BERGAMO IMPRESSIONS  1. Left ventricular ejection fraction, by estimation, is 65 to 70%. The left ventricle has normal  function. There is moderate concentric left ventricular hypertrophy.  2. Right ventricular systolic function is normal. The right ventricular size is normal.  3. The mitral valve is grossly normal. Conclusion(s)/Recommendation(s): Limited echo for LV function. FINDINGS  Left Ventricle: Left ventricular ejection fraction, by estimation, is 65 to 70%. The left ventricle has normal function. There is moderate concentric left ventricular hypertrophy. Right Ventricle: The right ventricular size is normal. Right ventricular systolic function is normal. Pericardium: There is no evidence of pericardial effusion. Mitral Valve: The mitral valve is grossly normal. Mild mitral annular calcification. Tricuspid Valve: The tricuspid valve is grossly normal. Toribio Fuel MD Electronically signed by Toribio Fuel MD Signature Date/Time: 07/08/2024/3:42:18 PM    Final    CARDIAC CATHETERIZATION Result Date: 07/08/2024 Images from the original result were not included. Cardiac Catheterization 07/08/24: Hemodynamic data: LVEDP 68 mmHg.  No pressure gradient across the aortic valve. Angiographic data: LM: Mildly calcified but normal. LCx: Small vessel, continuous is a small to medium sized OM 3 with tiny marginals coming proximally. LAD: Large-caliber vessel in the proximal segment, gives origin to large D1 and a moderate-sized D2.  Mid LAD has a focal 85% stenosis followed by tandem 60% stenosis and mid to distal and apical LAD is severely diffusely diseased and moderately calcified.  D1 has proximal 40% stenosis. RCA: Is a very large caliber vessel and a dominant vessel giving origin to moderate-sized PDA and a large PL branch with secondary branches.  It is occluded after the origin of RV branch. Intervention data: Successful angioplasty and stenting of the proximal segment to the mid segment of the RCA with implantation of a 3.0 x 48 mm Synergy XD DES at 20 atmospheric pressure, stenosis reduced from 100% to 0%.  There was  loss of RV branch but not of any clinical or hemodynamic consequence but minimal flow evident at the end of the case and suspect it will improve. Impression and recommendations: Patient needs aggressive risk modification, will need DAPT with aspirin  and Brilinta  for 1 year.   DG Chest Port 1 View Result Date: 07/08/2024 EXAM: 1 VIEW(S) XRAY OF THE CHEST 07/08/2024 12:21:00 PM COMPARISON: 05/22/2022 CLINICAL HISTORY: Chest pain. FINDINGS: LUNGS AND PLEURA: No focal pulmonary opacity. No pleural effusion. No pneumothorax. HEART AND MEDIASTINUM: Minimal aortic arch calcification. No acute abnormality of the cardiac and mediastinal silhouettes. BONES AND SOFT TISSUES: External defibrillator pad overlies right chest. No acute osseous abnormality. IMPRESSION: 1. No acute cardiopulmonary abnormality. 2. Minimal aortic arch calcification. Electronically signed by: Ryan Salvage MD 07/08/2024 01:13 PM EST RP Workstation: [id number]    Medications:   Current Medications:  [START ON 07/09/2024] aspirin   81 mg Oral Daily   atorvastatin   80 mg Oral Daily   Chlorhexidine  Gluconate Cloth  6 each Topical  Daily   heparin   5,000 Units Subcutaneous Q8H   insulin  aspart  0-15 Units Subcutaneous TID WC   insulin  aspart  0-5 Units Subcutaneous QHS   insulin  glargine  10 Units Subcutaneous QHS   [START ON 07/09/2024] levothyroxine   75 mcg Oral Q0600   metoprolol  succinate  25 mg Oral Daily   prednisoLONE  acetate  1 drop Both Eyes QID   [START ON 07/09/2024] sodium chloride  flush  3 mL Intravenous Q12H   ticagrelor   90 mg Oral BID    Infusions:  [START ON 07/09/2024] sodium chloride      sodium chloride  1 mL/kg/hr (07/08/24 1558)   norepinephrine      norepinephrine  (LEVOPHED ) Adult infusion        Assessment/Plan  STEMI, Inferior, Shock  HS Trop 506.  Lactic Acid 1.7> repeat lactic acid now.  S/P DES RCA with loss of RV branch. Echo in cath lab with no effusion.  - Post cath developed hypotension. Stat  Echo performed. LV/RV ok. No pericardial effusion noted.  -Given IV fluids and briefly on Norepi 30 mcg and quickly weaned to 5 mcg. - Dr Zenaida at bedside. Wean norepi as able. Suspect vagal episode.  - Hold bb with hypotension.  -On DAPT + statin.  - Obtain lactic acid.  -Repeat Echo in am.  - Can use zofran  as needed.   HLD  Continue high intensity statin. Repeat lipid panel.    Length of Stay: 0  Greig Mosses, NP  07/08/2024, 4:46 PM  Advanced Heart Failure Team Pager [phone] (M-F; 7a - 5p)   Please visit [email or web address]: For overnight coverage please call cardiology fellow first. If fellow not available call Shock/ECMO MD on call.  For ECMO / Mechanical Support (Impella, IABP, LVAD) issues call Shock / ECMO MD on call.      [1]  Allergies Allergen Reactions   Penicillins Hives        Hydrocodone  Nausea And Vomiting   Sulfa Antibiotics Rash   "

## 2024-07-08 NOTE — Progress Notes (Signed)
 Echocardiogram 2D Echocardiogram has been performed.  Megan Schroeder 07/08/2024, 5:28 PM

## 2024-07-08 NOTE — ED Notes (Signed)
 [facility] called to activate CODE STEMI. MD and primary nurse aware

## 2024-07-08 NOTE — ED Provider Notes (Signed)
 " [place] EMERGENCY DEPARTMENT AT [facility] Provider Note   CSN: [id number] Arrival date & time: 07/08/24  1144     Patient presents with: Chest Pain 79 year old female with history of hypertension, hyperlipidemia and diabetes comes in with chief complaint of central and substernal chest pain that started night before yesterday.  The pain is described as constant heaviness.  She thought it was GERD, and has been taking GERD medication without any relief.  Yesterday she started noticing exertional shortness of breath with exertion when she was doing some yard work.  Patient's pain is constant, there has been radiation to the neck on 1 occasion.  She denies any associated nausea, vomiting, sweating.  Patient has no previous cardiac disease history.  She went to see her PCP today, who was concerned about the history and advised her to come to the ER right away.  Megan Schroeder is a 79 y.o. female.   HPI     Prior to Admission medications  Medication Sig Start Date End Date Taking? Authorizing Provider  amLODipine  (NORVASC ) 10 MG tablet Take 1 tablet (10 mg total) by mouth daily. For BP 05/17/20   Pearlean Manus, MD  aspirin  EC 81 MG tablet Take 1 tablet (81 mg total) by mouth daily as needed for mild pain (pain score 1-3). 06/26/23   Johnson, Clanford L, MD  atorvastatin  (LIPITOR) 80 MG tablet Take 1 tablet (80 mg total) by mouth daily. 10/07/23   Debera Jayson MATSU, MD  Bromfenac  Sodium 0.07 % SOLN Place 1 drop into the right eye 4 (four) times daily. 07/19/23   Valdemar Rogue, MD  cephALEXin  (KEFLEX ) 500 MG capsule Take 1 capsule (500 mg total) by mouth 4 (four) times daily. 05/10/24   Suzette Pac, MD  cholecalciferol  (VITAMIN D ) 1000 UNITS tablet Take 1,000 Units by mouth daily.     [provider]  Coenzyme Q10 (COQ-10) 200 MG CAPS Take 200 mg by mouth daily.     [provider]  Continuous Glucose Sensor (FREESTYLE LIBRE 3 PLUS SENSOR) MISC as directed.  09/11/23   [provider]  dapagliflozin  propanediol (FARXIGA ) 10 MG TABS tablet Take 1 tablet (10 mg total) by mouth daily. 05/17/20   Pearlean Manus, MD  Dulaglutide (TRULICITY) 4.5 MG/0.5ML SOPN Inject 4.5 mg into the skin every Sunday.    [provider]  ezetimibe  (ZETIA ) 10 MG tablet Take 10 mg by mouth daily. 08/19/23   [provider]  GNP ULTICARE PEN NEEDLES 31G X 5 MM MISC See admin instructions. 09/07/20   [provider]  levothyroxine  (SYNTHROID ) 75 MCG tablet Take 1 tablet (75 mcg total) by mouth daily before breakfast. 05/17/20   Pearlean, Courage, MD  prednisoLONE  acetate (PRED FORTE ) 1 % ophthalmic suspension INSTILL 1 DROP INTO RIGHT EYE 4 TIMES A DAY 03/16/24   Valdemar Rogue, MD  TOUJEO  SOLOSTAR 300 UNIT/ML Solostar Pen Inject 10 Units into the skin at bedtime. 06/26/23   Vicci Afton CROME, MD    Allergies: Penicillins, Hydrocodone , and Sulfa antibiotics    Review of Systems  All other systems reviewed and are negative.   Updated Vital Signs BP (!) 126/91   Pulse (P) 95   Resp 17   Wt 69.4 kg   BMI 27.10 kg/m   Physical Exam Vitals and nursing note reviewed.  Constitutional:      Appearance: She is well-developed.  HENT:     Head: Atraumatic.  Cardiovascular:     Rate and Rhythm:  Normal rate.     Heart sounds: Normal heart sounds.  Pulmonary:     Effort: Pulmonary effort is normal.  Musculoskeletal:     Cervical back: Normal range of motion and neck supple.  Skin:    General: Skin is warm and dry.  Neurological:     Mental Status: She is alert and oriented to person, place, and time.     (all labs ordered are listed, but only abnormal results are displayed) Labs Reviewed  BASIC METABOLIC PANEL WITH GFR  PROTIME-INR  HEMOGLOBIN A1C  CBC WITH DIFFERENTIAL/PLATELET  APTT  LIPID PANEL  LACTIC ACID, PLASMA  TROPONIN T, HIGH SENSITIVITY    EKG: EKG Interpretation Date/Time:  Wednesday July 08 2024 12:02:49  EST Ventricular Rate:  100 PR Interval:  132 QRS Duration:  84 QT Interval:  355 QTC Calculation: 458 R Axis:   76  Text Interpretation: Second degree AV block, Mobitz II Probable inferior infarct, recent Lateral leads are also involved inferior leads ST elevation Confirmed by Charlyn Sora [phone]) on 07/08/2024 12:04:01 PM  Radiology: No results found.   .Critical Care  Performed by: Charlyn Sora, MD Authorized by: Charlyn Sora, MD   Critical care provider statement:    Critical care time (minutes):  30   Critical care was necessary to treat or prevent imminent or life-threatening deterioration of the following conditions:  Cardiac failure   Critical care was time spent personally by me on the following activities:  Development of treatment plan with patient or surrogate, discussions with consultants, evaluation of patient's response to treatment, examination of patient, ordering and review of laboratory studies, ordering and review of radiographic studies, ordering and performing treatments and interventions, pulse oximetry, re-evaluation of patient's condition, review of old charts and obtaining history from patient or surrogate    Medications Ordered in the ED  0.9 %  sodium chloride  infusion (has no administration in time range)  nitroGLYCERIN  (NITROSTAT ) SL tablet 0.4 mg (0.4 mg Sublingual Given 07/08/24 1215)  aspirin  chewable tablet 324 mg (324 mg Oral Given 07/08/24 1208)  heparin  injection 4,000 Units (4,000 Units Intravenous Given 07/08/24 1212)                                    Medical Decision Making Amount and/or Complexity of Data Reviewed Labs: ordered.  Risk OTC drugs. Prescription drug management.   79 year old patient comes in with chief complaint of substernal and central chest heaviness described as  elephant sitting on the chest that has been constant for over 24 hours now.  There is exertional shortness of breath.  Patient has pertinent past  medical history of hypertension, hyperlipidemia, diabetes.  EKG is concerning for inferior STEMI and there are some reciprocal depression as well.  I activated code STEMI and discussed the case with Dr. Ladona, cardiologist.  He will except the patient at the Cath Lab.  Patient received aspirin , heparin .  She did receive 2 rounds of nitro before [facility] arrived, and states that the pain did respond to it.  She is made aware of her diagnosis and is stable for transport at this time. Blood pressure is stable and within normal limits.   Final diagnoses:  Acute ST elevation myocardial infarction (STEMI) of inferior wall [facility])    ED Discharge Orders     None          Charlyn Sora, MD 07/08/24 1222  "

## 2024-07-08 NOTE — ED Triage Notes (Signed)
 Pt POV from Dr. Lemmie office for concerning EKG. Pt says it feels like elephant on her heart. Pt endorses headache. Pt endorses SHOB when walking. Denies N/V. Pt not diaphoretic.

## 2024-07-08 NOTE — H&P (Addendum)
 " CARDIOLOGY ADMIT NOTE     Patient ID: Megan Schroeder MRN: [id number] DOB/AGE: 08/08/1945 79 y.o.  Admit date: 07/08/2024.   Primary Physician:  Joeann Browning, FNP  Patient ID: Megan Schroeder, female    DOB: 04/28/1946, 79 y.o.   MRN: [id number]  Chief Complaint  Patient presents with   Chest Pain   HPI:    Megan Schroeder  is a 79 y.o. Caucasian female patient with hypertension, hypercholesterolemia, diabetes mellitus, has known coronary artery disease by remote cardiac catheterization revealing diffusely diseased LAD, started having chest discomfort 2 nights ago and thought that she was having heartburn and took heartburn medications and Tums at home with no relief.  Chest pain persisted continuously in the middle of the chest yesterday and this morning decided to go to her PCP as she started noticing worsening dyspnea with exertion activity that is new.  She was then eventually transferred to [facility] emergency department for further evaluation, upon presentation had ongoing chest discomfort and ST elevations in the inferolateral leads and STEMI was activated and brought emergently to [facility] for further intervention.  Patient presently still having chest discomfort.  No nausea or vomiting or diaphoresis.  Past Medical History:  Diagnosis Date   Anxiety    CAD (coronary artery disease)    Diabetic retinopathy (HCC)    NPDR OU   Hyperlipidemia    Hypertension    Hypertensive retinopathy    OU   Hypothyroidism    Type 2 diabetes mellitus (HCC)    Social History   Tobacco Use   Smoking status: Never   Smokeless tobacco: Never  Substance Use Topics   Alcohol use: No   Family History  Problem Relation Age of Onset   Cancer Father    Diabetes Father    Colon cancer Sister     ROS  Review of Systems  Cardiovascular:  Positive for chest pain and dyspnea on exertion. Negative for leg swelling.   Objective      07/08/2024    2:36 PM 07/08/2024     2:31 PM 07/08/2024    2:26 PM  Vitals with BMI  Systolic 111 111 96  Diastolic 58 58 63  Pulse 0 71 75      Physical Exam Neck:     Vascular: No carotid bruit or JVD.  Cardiovascular:     Rate and Rhythm: Normal rate and regular rhythm.     Pulses: Intact distal pulses.     Heart sounds: Heart sounds are distant. No murmur heard.    No gallop.  Pulmonary:     Effort: Pulmonary effort is normal.     Breath sounds: Normal breath sounds.  Abdominal:     General: Bowel sounds are normal.     Palpations: Abdomen is soft.  Musculoskeletal:     Right lower leg: No edema.     Left lower leg: No edema.    Laboratory examination:   Recent Labs    05/10/24 0815 07/08/24 1208  NA 139 136  K 4.0 3.8  CL 102 98  CO2 21* 22  GLUCOSE 128* 256*  BUN 12 14  CREATININE 0.75 0.98  CALCIUM  9.9 9.8  GFRNONAA >60 60*   estimated creatinine clearance is 45.6 mL/min (by C-G formula based on SCr of 0.98 mg/dL).     Latest Ref Rng & Units 07/08/2024   12:08 PM 05/10/2024    8:15 AM 06/26/2023    5:16 AM  CMP  Glucose 70 - 99 mg/dL 743  871  879   BUN 8 - 23 mg/dL 14  12  8    Creatinine 0.44 - 1.00 mg/dL 9.01  9.24  9.43   Sodium 135 - 145 mmol/L 136  139  137   Potassium 3.5 - 5.1 mmol/L 3.8  4.0  3.8   Chloride 98 - 111 mmol/L 98  102  106   CO2 22 - 32 mmol/L 22  21  23    Calcium  8.9 - 10.3 mg/dL 9.8  9.9  8.7   Total Protein 6.5 - 8.1 g/dL  8.2    Total Bilirubin 0.0 - 1.2 mg/dL  0.8    Alkaline Phos 38 - 126 U/L  92    AST 15 - 41 U/L  21    ALT 0 - 44 U/L  14        Latest Ref Rng & Units 07/08/2024   12:17 PM 05/10/2024    8:15 AM 06/25/2023    4:54 AM  CBC  WBC 4.0 - 10.5 K/uL 9.9  11.7  6.0   Hemoglobin 12.0 - 15.0 g/dL 86.5  87.5  89.7   Hematocrit 36.0 - 46.0 % 40.8  37.7  30.2   Platelets 150 - 400 K/uL 192  PLATELET CLUMPS NOTED ON SMEAR, COUNT APPEARS ADEQUATE  157    Lipid Panel     Component Value Date/Time   CHOL 172 07/08/2024 1217   TRIG 167 (H)  07/08/2024 1217   HDL 58 07/08/2024 1217   CHOLHDL 3.0 07/08/2024 1217   VLDL 33 07/08/2024 1217   LDLCALC 80 07/08/2024 1217   HEMOGLOBIN A1C Lab Results  Component Value Date   HGBA1C 9.0 03/10/2024   MPG 177.16 06/25/2023     Medications and allergies  Allergies[1]     [START ON 07/09/2024] sodium chloride      sodium chloride      famotidine       Current Outpatient Medications  Medication Instructions   amLODipine  (NORVASC ) 10 mg, Oral, Daily, For BP   aspirin  EC 81 mg, Oral, Daily PRN   atorvastatin  (LIPITOR) 80 mg, Oral, Daily   Bromfenac  Sodium 0.07 % SOLN 1 drop, Right Eye, 4 times daily   cholecalciferol  (VITAMIN D ) 1,000 Units, Daily   Continuous Glucose Sensor (FREESTYLE LIBRE 3 PLUS SENSOR) MISC As directed   CoQ-10 200 mg, Daily   dapagliflozin  propanediol (FARXIGA ) 10 mg, Oral, Daily   ezetimibe  (ZETIA ) 10 mg, Daily   GNP ULTICARE PEN NEEDLES 31G X 5 MM MISC See admin instructions   levothyroxine  (SYNTHROID ) 75 mcg, Oral, Daily before breakfast   prednisoLONE  acetate (PRED FORTE ) 1 % ophthalmic suspension INSTILL 1 DROP INTO RIGHT EYE 4 TIMES A DAY   Toujeo  SoloStar 10 Units, Subcutaneous, Daily at bedtime   Trulicity 4.5 mg, Every Sun    Radiology:  Imaging results have been reviewed  Cardiac Studies:   EKG 1/28: Sinus rhythm with first-degree AV block at rate of 100 bpm, ST elevation in inferior leads and mild ST elevation lateral leads with reciprocal ST depression in high lateral leads.  Frequent PACs./2026  Assessment   1.  Acute inferolateral STEMI  Recommendations:   With ongoing chest discomfort, will proceed with emergent cardiac catheterization.  Patient underwent successful angioplasty and stenting of the proximal to mid segment RCA with implantation of 3.0 by 48 mm Synergy XD DES deployed at 20 atmospheric pressure and postdilated with 3.25 mm Wingate balloon at 20 atmospheric pressure.  There was loss of RV branch post angioplasty however of  no hemodynamic consequence.  Patient transiently also required inotropic support due to hypotension during PCI.  Bedside echocardiogram was also performed during PCI revealing no evidence of pericardial effusion.  I was involved in her care and transferred from [facility] after I discussed her presentation with ED physician Dr. Charlyn.  Time spent included coordination of care and plans for catheterization, patient and high risk presentation with high risk of cardiac death.   Gordy Bergamo, MD, [facility] August 06, 2024, 3:12 PM [facility] [address] [place], [place] [address] Phone: [phone]. Fax:  [phone]       [1]  Allergies Allergen Reactions   Penicillins Hives        Hydrocodone  Nausea And Vomiting   Sulfa Antibiotics Rash   "

## 2024-07-08 NOTE — Progress Notes (Signed)
" °  Echocardiogram 2D Echocardiogram has been performed.  Megan Schroeder 07/08/2024, 2:18 PM "

## 2024-07-08 NOTE — ED Notes (Signed)
 Report given to [facility] in cath lab

## 2024-07-09 ENCOUNTER — Other Ambulatory Visit: Payer: Self-pay

## 2024-07-09 ENCOUNTER — Other Ambulatory Visit (HOSPITAL_COMMUNITY): Payer: Self-pay

## 2024-07-09 ENCOUNTER — Encounter (HOSPITAL_COMMUNITY): Payer: Self-pay | Admitting: Cardiology

## 2024-07-09 ENCOUNTER — Telehealth (HOSPITAL_COMMUNITY): Payer: Self-pay | Admitting: Pharmacy Technician

## 2024-07-09 ENCOUNTER — Inpatient Hospital Stay (HOSPITAL_COMMUNITY)

## 2024-07-09 DIAGNOSIS — I2119 ST elevation (STEMI) myocardial infarction involving other coronary artery of inferior wall: Secondary | ICD-10-CM

## 2024-07-09 LAB — BASIC METABOLIC PANEL WITH GFR
Anion gap: 11 (ref 5–15)
BUN: 11 mg/dL (ref 8–23)
CO2: 22 mmol/L (ref 22–32)
Calcium: 8.7 mg/dL — ABNORMAL LOW (ref 8.9–10.3)
Chloride: 101 mmol/L (ref 98–111)
Creatinine, Ser: 0.74 mg/dL (ref 0.44–1.00)
GFR, Estimated: >60 mL/min
Glucose, Bld: 215 mg/dL — ABNORMAL HIGH (ref 70–99)
Potassium: 3.7 mmol/L (ref 3.5–5.1)
Sodium: 134 mmol/L — ABNORMAL LOW (ref 135–145)

## 2024-07-09 LAB — CBC
HCT: 31.9 % — ABNORMAL LOW (ref 36.0–46.0)
Hemoglobin: 10.6 g/dL — ABNORMAL LOW (ref 12.0–15.0)
MCH: 28.2 pg (ref 26.0–34.0)
MCHC: 33.2 g/dL (ref 30.0–36.0)
MCV: 84.8 fL (ref 80.0–100.0)
Platelets: 160 10*3/uL (ref 150–400)
RBC: 3.76 MIL/uL — ABNORMAL LOW (ref 3.87–5.11)
RDW: 14.3 % (ref 11.5–15.5)
WBC: 9.3 10*3/uL (ref 4.0–10.5)
nRBC: 0 % (ref 0.0–0.2)

## 2024-07-09 LAB — GLUCOSE, CAPILLARY
Glucose-Capillary: 133 mg/dL — ABNORMAL HIGH (ref 70–99)
Glucose-Capillary: 266 mg/dL — ABNORMAL HIGH (ref 70–99)
Glucose-Capillary: 161 mg/dL — ABNORMAL HIGH (ref 70–99)
Glucose-Capillary: 109 mg/dL — ABNORMAL HIGH (ref 70–99)
Glucose-Capillary: 180 mg/dL — ABNORMAL HIGH (ref 70–99)

## 2024-07-09 LAB — ECHOCARDIOGRAM LIMITED
Area-P 1/2: 6.17 cm2
S' Lateral: 3.3 cm
Weight: 2515.01 [oz_av]

## 2024-07-09 MED ORDER — PERFLUTREN LIPID MICROSPHERE
1.0000 mL | INTRAVENOUS | Status: AC | PRN
  Administered 2024-07-09: 4 mL via INTRAVENOUS

## 2024-07-09 MED ORDER — EZETIMIBE 10 MG PO TABS
10.0000 mg | ORAL_TABLET | Freq: Every day | ORAL | Status: DC
  Administered 2024-07-09 – 2024-07-10 (×2): 10 mg via ORAL
  Filled 2024-07-09 (×2): qty 1

## 2024-07-09 MED ORDER — ASPIRIN 81 MG PO TBEC
81.0000 mg | DELAYED_RELEASE_TABLET | Freq: Every day | ORAL | Status: DC
  Administered 2024-07-09 – 2024-07-10 (×2): 81 mg via ORAL
  Filled 2024-07-09 (×2): qty 1

## 2024-07-09 MED ORDER — POTASSIUM CHLORIDE CRYS ER 20 MEQ PO TBCR
40.0000 meq | EXTENDED_RELEASE_TABLET | Freq: Once | ORAL | Status: AC
  Administered 2024-07-09: 40 meq via ORAL
  Filled 2024-07-09: qty 2

## 2024-07-09 MED ORDER — PREDNISOLONE ACETATE 1 % OP SUSP
1.0000 [drp] | Freq: Two times a day (BID) | OPHTHALMIC | Status: DC
  Administered 2024-07-09 – 2024-07-10 (×3): 1 [drp] via OPHTHALMIC
  Filled 2024-07-09: qty 5

## 2024-07-09 NOTE — Plan of Care (Signed)

## 2024-07-09 NOTE — Telephone Encounter (Signed)
 Patient Product/process Development Scientist completed.    The patient is insured through [employer] [facility] [employer]. Patient has Medicare and is not eligible for a copay card, but may be able to apply for patient assistance or Medicare RX Payment Plan (Patient Must reach out to their plan, if eligible for payment plan), if available.    Ran test claim for ticagrelor  (Brilinta ) 90 mg and the current 30 day co-pay is $17.45.   This test claim was processed through [facility]- copay amounts may vary at other pharmacies due to pharmacy/plan contracts, or as the patient moves through the different stages of their insurance plan.     Reyes Sharps, CPHT Pharmacy Technician Patient Advocate Specialist [facility] Patient Advocate Team Direct Number: [phone]  Fax: [phone]

## 2024-07-09 NOTE — Progress Notes (Addendum)
 "    Advanced Heart Failure Rounding Note  Cardiologist: Jayson Sierras, MD  [facility] Cardiologist:  Chief Complaint: STEMI Patient Profile   Megan Schroeder is a 79 y.o. female with nown coronary artery disease with prior catheterization showing diffuse LAD disease who presented to the emergency department with 2 days of indigestion and chest heaviness. EKG showed ST elevation in the inferior leads, patient was taken urgently to Cath Lab.   Significant events:   1/28 Cath -chronically diseased LAD and occluded RCA with evidence of left-to-right collaterals.  Patient underwent successful PCI with good result, procedure was complicated by development of small wire perforation, though appeared self-contained and follow-up echo without pericardial effusion. LVEF 60-65% RV normal.   Subjective:    Norepi off this morning. Walked around the unit. Denies chest pain.    Objective:   Weight Range: 71.3 kg Body mass index is 27.84 kg/m.   Vital Signs:   Temp:  [97.7 F (36.5 C)-98.6 F (37 C)] 97.7 F (36.5 C) (01/29 0833) Pulse Rate:  [0-98] 86 (01/29 0830) Resp:  [10-37] 14 (01/29 0830) BP: (65-140)/(34-97) 126/60 (01/29 0830) SpO2:  [92 %-100 %] 95 % (01/29 0830) Weight:  [65 kg-71.3 kg] 71.3 kg (01/29 0600) Last BM Date : 07/08/24  Weight change: Filed Weights   07/08/24 1201 07/08/24 1210 07/09/24 0600  Weight: 65 kg 69.4 kg 71.3 kg    Intake/Output:   Intake/Output Summary (Last 24 hours) at 07/09/2024 0902 Last data filed at 07/09/2024 0800 Gross per 24 hour  Intake 2931.68 ml  Output --  Net 2931.68 ml     Physical Exam   General:  well appearing.   Cor: Regular rate & rhythm. No murmurs. JVD flat cm.  Lungs: clear Extremities: no edema . R radial pulse 3+   Telemetry   SR   Labs   CBC Recent Labs    07/08/24 1217 07/08/24 1614  WBC 9.9 13.3*  NEUTROABS 6.5  --   HGB 13.4 12.0  HCT 40.8 36.1  MCV 85.2 84.9  PLT 192 182   Basic Metabolic  Panel Recent Labs    07/08/24 1208  NA 136  K 3.8  CL 98  CO2 22  GLUCOSE 256*  BUN 14  CREATININE 0.98  CALCIUM  9.8   Liver Function Tests No results for input(s): AST, ALT, ALKPHOS, BILITOT, PROT, ALBUMIN in the last 72 hours. No results for input(s): LIPASE, AMYLASE in the last 72 hours. Cardiac Enzymes No results for input(s): CKTOTAL, CKMB, CKMBINDEX, TROPONINI in the last 72 hours.  BNP: BNP (last 3 results) No results for input(s): BNP in the last 8760 hours.  ProBNP (last 3 results) No results for input(s): PROBNP in the last 8760 hours.   D-Dimer No results for input(s): DDIMER in the last 72 hours. Hemoglobin A1C Recent Labs    07/08/24 1217  HGBA1C 8.5*   Fasting Lipid Panel Recent Labs    07/08/24 1217  CHOL 172  HDL 58  LDLCALC 80  TRIG 167*  CHOLHDL 3.0   Medications:   Scheduled Medications:  aspirin  EC  81 mg Oral Daily   atorvastatin   80 mg Oral Daily   Chlorhexidine  Gluconate Cloth  6 each Topical Daily   Chlorhexidine  Gluconate Cloth  6 each Topical Daily   heparin   5,000 Units Subcutaneous Q8H   insulin  aspart  0-15 Units Subcutaneous TID WC   insulin  aspart  0-5 Units Subcutaneous QHS   insulin  glargine  10 Units Subcutaneous  QHS   levothyroxine   75 mcg Oral Q0600   mupirocin  ointment  1 Application Nasal BID   prednisoLONE  acetate  1 drop Right Eye BID   sodium chloride  flush  3 mL Intravenous Q12H   ticagrelor   90 mg Oral BID    Infusions:  sodium chloride      sodium chloride  10 mL/hr at 07/09/24 0800   norepinephrine  (LEVOPHED ) Adult infusion 2 mcg/min (07/09/24 0800)    PRN Medications: sodium chloride , acetaminophen , alum & mag hydroxide-simeth, aspirin  EC, morphine  injection, ondansetron  (ZOFRAN ) IV, mouth rinse, sodium chloride  flush  Assessment/Plan  STEMI, Inferior, Shock  HS Trop 506.  Lactic Acid 1.7> repeat lactic acid now.  S/P DES RCA with loss of RV branch. Echo in cath lab with  no effusion.  - Post cath developed hypotension. Stat Echo performed. LV/RV ok. No pericardial effusion noted. Given IV fluids and briefly on Norepi 30 mcg and quickly weaned to 5 mcg. - Off Norepi. Maps stable.   - Consider BB tomorrow.  -On DAPT + statin.    HLD  Continue high intensity statin. Repeat lipid panel.   Check BMET and CBC now.  OOB ambulate.  Transfer to 6east   Heart failure team will sign off as of 07/09/24 Follow up as an outpatient in the HF clinic ?   No  If no please follow up with [facility]  after discharge.    Length of Stay: 1  Megan Clegg, NP  07/09/2024, 9:02 AM  Advanced Heart Failure Team Pager [phone] (M-F; 7a - 5p)   Please visit [email or web address]: For overnight coverage please call cardiology fellow first. If fellow not available call Shock/ECMO MD on call.  For ECMO / Mechanical Support (Impella, IABP, LVAD) issues call Shock / ECMO MD on call.   Patient seen with NP, I formulated the plan and agree with the above note.   No chest pain this morning, walked without dyspnea.   She was on NE 2 overnight, was just stopped.   Echo yesterday showed EF 60-65%, normal RV, no pericardial effusion.   General: NAD Neck: No JVD, no thyromegaly or thyroid  nodule.  Lungs: Clear to auscultation bilaterally with normal respiratory effort. CV: Nondisplaced PMI.  Heart regular S1/S2, no S3/S4, no murmur or rub.  No peripheral edema.   Abdomen: Soft, nontender, no hepatosplenomegaly, no distention.  Skin: Intact without lesions or rashes.  Neurologic: Alert and oriented x 3.  Psych: Normal affect. Extremities: No clubbing or cyanosis.  HEENT: Normal.   BP stable now off NE.  Echo yesterday showed normal LV/RV function.   PCI complicated by small wire perforation, no pericardial effusion on echo yesterday pm.  Will get limited echo this morning for pericardial effusion.   Inferior STEMI, s/p DES to [facility] with loss of RV marginal branch. No evidence for RV  infarct.  Has residual chronic diffuse LAD disease.  - Continue atorvastatin  80 daily, add Zetia  with LDL 80.   - Continue ASA and ticagrelor .  - If BP remains stable, add ARB tomorrow.   OK for floor, Team C to follow.   Megan Schroeder 07/09/2024 9:49 AM   "

## 2024-07-09 NOTE — Progress Notes (Signed)
 Echocardiogram Attempted exam at 1010am - patient not in room; will try again later.  Megan Schroeder 07/09/2024, 10:11 AM

## 2024-07-09 NOTE — Discharge Instructions (Signed)

## 2024-07-09 NOTE — Progress Notes (Signed)
 Echocardiogram 2D Echocardiogram has been performed.  Megan Schroeder 07/09/2024, 2:57 PM

## 2024-07-10 ENCOUNTER — Other Ambulatory Visit (HOSPITAL_COMMUNITY): Payer: Self-pay

## 2024-07-10 ENCOUNTER — Other Ambulatory Visit: Payer: Self-pay | Admitting: Cardiology

## 2024-07-10 LAB — LIPOPROTEIN A (LPA): Lipoprotein (a): 22.6 nmol/L

## 2024-07-10 LAB — BASIC METABOLIC PANEL WITH GFR
Anion gap: 11 (ref 5–15)
BUN: 16 mg/dL (ref 8–23)
CO2: 21 mmol/L — ABNORMAL LOW (ref 22–32)
Calcium: 8.5 mg/dL — ABNORMAL LOW (ref 8.9–10.3)
Chloride: 103 mmol/L (ref 98–111)
Creatinine, Ser: 0.83 mg/dL (ref 0.44–1.00)
GFR, Estimated: >60 mL/min
Glucose, Bld: 123 mg/dL — ABNORMAL HIGH (ref 70–99)
Potassium: 4 mmol/L (ref 3.5–5.1)
Sodium: 135 mmol/L (ref 135–145)

## 2024-07-10 LAB — GLUCOSE, CAPILLARY: Glucose-Capillary: 151 mg/dL — ABNORMAL HIGH (ref 70–99)

## 2024-07-10 MED ORDER — EZETIMIBE 10 MG PO TABS
10.0000 mg | ORAL_TABLET | Freq: Every day | ORAL | 1 refills | Status: AC
  Filled 2024-07-10: qty 90, 90d supply, fill #0

## 2024-07-10 MED ORDER — NITROGLYCERIN 0.4 MG SL SUBL
0.4000 mg | SUBLINGUAL_TABLET | SUBLINGUAL | 2 refills | Status: AC | PRN
  Filled 2024-07-10: qty 25, 8d supply, fill #0

## 2024-07-10 MED ORDER — ASPIRIN 81 MG PO TBEC
81.0000 mg | DELAYED_RELEASE_TABLET | Freq: Every day | ORAL | 2 refills | Status: DC | PRN
  Filled 2024-07-10: qty 90, 90d supply, fill #0

## 2024-07-10 MED ORDER — METOPROLOL SUCCINATE ER 25 MG PO TB24
12.5000 mg | ORAL_TABLET | Freq: Every day | ORAL | 2 refills | Status: DC
  Filled 2024-07-10: qty 30, 60d supply, fill #0

## 2024-07-10 MED ORDER — ASPIRIN 81 MG PO TBEC
81.0000 mg | DELAYED_RELEASE_TABLET | Freq: Every day | ORAL | 2 refills | Status: AC
  Filled 2024-07-10: qty 90, 90d supply, fill #0

## 2024-07-10 MED ORDER — METOPROLOL SUCCINATE ER 25 MG PO TB24
25.0000 mg | ORAL_TABLET | Freq: Every day | ORAL | 2 refills | Status: AC
  Filled 2024-07-10: qty 30, 30d supply, fill #0

## 2024-07-10 MED ORDER — TICAGRELOR 90 MG PO TABS
90.0000 mg | ORAL_TABLET | Freq: Two times a day (BID) | ORAL | 2 refills | Status: AC
  Filled 2024-07-10: qty 180, 90d supply, fill #0

## 2024-07-10 NOTE — Discharge Summary (Addendum)
 " Discharge Summary   Patient ID: Megan Schroeder MRN: [id number]; DOB: 1946-03-16  Admit date: 07/08/2024 Discharge date: 07/10/2024  PCP:  Joeann Browning, FNP   [facility] Providers Cardiologist:  Jayson Sierras, MD     Discharge Diagnoses  Principal Problem:   Acute MI, inferolateral wall, initial episode of care [facility]) Active Problems:   Diabetes mellitus (HCC)   Hyperlipidemia   Diagnostic Studies/Procedures   Cardiac Catheterization 07/08/24: Hemodynamic data: LVEDP 68 mmHg.  No pressure gradient across the aortic valve.   Angiographic data: LM: Mildly calcified but normal. LCx: Small vessel, continuous is a small to medium sized OM 3 with tiny marginals coming proximally. LAD: Large-caliber vessel in the proximal segment, gives origin to large D1 and a moderate-sized D2.  Mid LAD has a focal 85% stenosis followed by tandem 60% stenosis and mid to distal and apical LAD is severely diffusely diseased and moderately calcified.  D1 has proximal 40% stenosis. RCA: Is a very large caliber vessel and a dominant vessel giving origin to moderate-sized PDA and a large PL branch with secondary branches.  It is occluded after the origin of RV branch.   Intervention data: Successful angioplasty and stenting of the proximal segment to the mid segment of the RCA with implantation of a 3.0 x 48 mm Synergy XD DES at 20 atmospheric pressure, stenosis reduced from 100% to 0%.  There was loss of RV branch but not of any clinical or hemodynamic consequence but minimal flow evident at the end of the case and suspect it will improve.      Impression and recommendations: Patient needs aggressive risk modification, will need DAPT with aspirin  and Brilinta  for 1 year.  Echo: 07/08/2024  IMPRESSIONS     1. Left ventricular ejection fraction, by estimation, is 60 to 65%. The  left ventricle has normal function. The left ventricle has no regional  wall motion abnormalities. There is  moderate concentric left ventricular  hypertrophy. Left ventricular  diastolic parameters are consistent with Grade I diastolic dysfunction  (impaired relaxation).   2. Right ventricular systolic function is normal. The right ventricular  size is normal.   3. The mitral valve is degenerative. Trivial mitral valve regurgitation.  No evidence of mitral stenosis.   4. The aortic valve is tricuspid. There is mild calcification of the  aortic valve. Aortic valve regurgitation is not visualized. No aortic  stenosis is present.   5. The inferior vena cava is normal in size with greater than 50%  respiratory variability, suggesting right atrial pressure of 3 mmHg.   FINDINGS   Left Ventricle: Left ventricular ejection fraction, by estimation, is 60  to 65%. The left ventricle has normal function. The left ventricle has no  regional wall motion abnormalities. The left ventricular internal cavity  size was normal in size. There is   moderate concentric left ventricular hypertrophy. Left ventricular  diastolic parameters are consistent with Grade I diastolic dysfunction  (impaired relaxation).   Right Ventricle: The right ventricular size is normal. No increase in  right ventricular wall thickness. Right ventricular systolic function is  normal.   Left Atrium: Left atrial size was normal in size.   Right Atrium: Right atrial size was normal in size.   Pericardium: There is no evidence of pericardial effusion.   Mitral Valve: The mitral valve is degenerative in appearance. Mild mitral  annular calcification. Trivial mitral valve regurgitation. No evidence of  mitral valve stenosis.   Tricuspid Valve: The tricuspid  valve is normal in structure. Tricuspid  valve regurgitation is trivial. No evidence of tricuspid stenosis.   Aortic Valve: The aortic valve is tricuspid. There is mild calcification  of the aortic valve. Aortic valve regurgitation is not visualized. No  aortic stenosis is  present.   Pulmonic Valve: The pulmonic valve was normal in structure. Pulmonic valve  regurgitation is trivial. No evidence of pulmonic stenosis.   Aorta: The aortic root is normal in size and structure.   Venous: The inferior vena cava is normal in size with greater than 50%  respiratory variability, suggesting right atrial pressure of 3 mmHg.   IAS/Shunts: No atrial level shunt detected by color flow Doppler.   _____________   History of Present Illness   Megan Schroeder is a 79 y.o. female with hypertension, hypercholesterolemia, diabetes mellitus, has known coronary artery disease by remote cardiac catheterization revealing diffusely diseased LAD, started having chest discomfort 2 nights prior to admission and thought that she was having heartburn and took heartburn medications and Tums at home with no relief. Chest pain persisted continuously in the middle of the chest the day prior to admission. Presented to her PCP the day of admission with worsening dyspnea with exertion activity that is new. She was then eventually transferred to [facility] emergency department for further evaluation, upon presentation had ongoing chest discomfort and ST elevations in the inferolateral leads and STEMI was activated and brought emergently to [facility] for further intervention.   Hospital Course    Inferior STEMI Shock -- Underwent successful PCI/DES x 1 to mid RCA was complicated by small wire perforation as well as loss of RV marginal branch.  Required IV fluids and briefly on norepinephrine  which was quickly weaned.  -- Recommendations for DAPT with aspirin /Brilinta  for at least 1 year -- Echo with LVEF of 60 to 65%, no regional wall motion, normal RV, trivial MR. -- Seen by cardiac rehab -- Continue aspirin , Brilinta , atorvastatin  80 mg daily, Zetia  10 mg daily, metoprolol  XL 25 mg daily  HLD -- LDL 80, HDL 58, LP(a) pending -- Atorvastatin  80 mg daily, Zetia  10 mg daily  DM --  Hemoglobin A1c 8.5 -- Resume Farxiga  at discharge, continue Trulicity and Toujeo   Patient was seen by Dr. Jordan and deemed stable for discharge home. Follow up arranged in the office. Medications sent to the [facility] pharmacy.   Did the patient have an acute coronary syndrome (MI, NSTEMI, STEMI, etc) this admission?:  Yes                               AHA/ACC ACS Clinical Performance & Quality Measures: Aspirin  prescribed? - Yes ADP Receptor Inhibitor (Plavix/Clopidogrel, Brilinta /Ticagrelor  or Effient/Prasugrel) prescribed (includes medically managed patients)? - Yes Beta Blocker prescribed? - Yes High Intensity Statin (Lipitor 40-80mg  or Crestor 20-40mg ) prescribed? - Yes EF assessed during THIS hospitalization? - Yes For EF <40%, was ACEI/ARB prescribed? - Not Applicable (EF >/= 40%) For EF <40%, Aldosterone Antagonist (Spironolactone or Eplerenone) prescribed? - Not Applicable (EF >/= 40%) Cardiac Rehab Phase II ordered (including medically managed patients)? - Yes   The patient will be scheduled for a TOC follow up appointment in 10-14 days.  A message has been sent to the [facility] and Scheduling Pool at the office where the patient should be seen for follow up.  _____________  Discharge Vitals Blood pressure 115/85, pulse 83, temperature 98.6 F (37 C), temperature source  Oral, resp. rate 18, height 5' 3 (1.6 m), weight 71.3 kg, SpO2 98%.  Filed Weights   07/08/24 1201 07/08/24 1210 07/09/24 0600  Weight: 65 kg 69.4 kg 71.3 kg    Labs & Radiologic Studies  CBC Recent Labs    07/08/24 1217 07/08/24 1614 07/09/24 0924  WBC 9.9 13.3* 9.3  NEUTROABS 6.5  --   --   HGB 13.4 12.0 10.6*  HCT 40.8 36.1 31.9*  MCV 85.2 84.9 84.8  PLT 192 182 160   Basic Metabolic Panel Recent Labs    98/70/73 0924 07/10/24 0412  NA 134* 135  K 3.7 4.0  CL 101 103  CO2 22 21*  GLUCOSE 215* 123*  BUN 11 16  CREATININE 0.74 0.83  CALCIUM  8.7* 8.5*   Liver Function Tests No results for  input(s): AST, ALT, ALKPHOS, BILITOT, PROT, ALBUMIN in the last 72 hours. No results for input(s): LIPASE, AMYLASE in the last 72 hours. High Sensitivity Troponin:   No results for input(s): TROPONINIHS in the last 720 hours.  Recent Labs  Lab 07/08/24 1208  TRNPT 506*    BNP Invalid input(s): POCBNP No results for input(s): PROBNP in the last 72 hours.  No results for input(s): BNP in the last 72 hours.  D-Dimer No results for input(s): DDIMER in the last 72 hours. Hemoglobin A1C Recent Labs    07/08/24 1217  HGBA1C 8.5*   Fasting Lipid Panel Recent Labs    07/08/24 1217  CHOL 172  HDL 58  LDLCALC 80  TRIG 167*  CHOLHDL 3.0   No results found for: LIPOA  Thyroid  Function Tests No results for input(s): TSH, T4TOTAL, T3FREE, THYROIDAB in the last 72 hours.  Invalid input(s): FREET3 _____________  ECHOCARDIOGRAM LIMITED Result Date: 07/09/2024    ECHOCARDIOGRAM LIMITED REPORT   Patient Name:   SHAMIKA PEDREGON Date of Exam: 07/09/2024 Medical Rec #:  [id number]       Height:       63.0 in Accession #:    [id number]      Weight:       157.2 lb Date of Birth:  Oct 11, 1945      BSA:          1.745 m Patient Age:    78 years        BP:           117/58 mmHg Patient Gender: F               HR:           85 bpm. Exam Location:  Inpatient Procedure: Cardiac Doppler, Color Doppler, Limited Echo and Intracardiac            Opacification Agent (Both Spectral and Color Flow Doppler were            utilized during procedure). Indications:    Acute MI, inferolateral wall, initial episode of care  History:        Patient has prior history of Echocardiogram examinations, most                 recent 07/08/2024. Acute MI and CAD; Risk Factors:Hypertension,                 Diabetes and Dyslipidemia.  Sonographer:    Juliene Rucks Referring Phys: [id number] GORDY BERGAMO IMPRESSIONS  1. Left ventricular ejection fraction, by estimation, is 60 to 65%. The left ventricle has  normal function. The left ventricle has no regional wall  motion abnormalities. Left ventricular diastolic function could not be evaluated.  2. Right ventricular systolic function is normal. The right ventricular size is normal. Tricuspid regurgitation signal is inadequate for assessing PA pressure.  3. The mitral valve is normal in structure. Trivial mitral valve regurgitation. No evidence of mitral stenosis.  4. The aortic valve is normal in structure. Aortic valve regurgitation is not visualized. No aortic stenosis is present.  5. The inferior vena cava is normal in size with greater than 50% respiratory variability, suggesting right atrial pressure of 3 mmHg. FINDINGS  Left Ventricle: Left ventricular ejection fraction, by estimation, is 60 to 65%. The left ventricle has normal function. The left ventricle has no regional wall motion abnormalities. Definity  contrast agent was given IV to delineate the left ventricular  endocardial borders. The left ventricular internal cavity size was normal in size. There is no left ventricular hypertrophy. Left ventricular diastolic function could not be evaluated. Right Ventricle: The right ventricular size is normal. No increase in right ventricular wall thickness. Right ventricular systolic function is normal. Tricuspid regurgitation signal is inadequate for assessing PA pressure. Left Atrium: Left atrial size was normal in size. Right Atrium: Right atrial size was normal in size. Pericardium: Trivial pericardial effusion is present. The pericardial effusion is anterior to the right ventricle. Mitral Valve: The mitral valve is normal in structure. Trivial mitral valve regurgitation. No evidence of mitral valve stenosis. Tricuspid Valve: The tricuspid valve is normal in structure. Tricuspid valve regurgitation is not demonstrated. No evidence of tricuspid stenosis. Aortic Valve: The aortic valve is normal in structure. Aortic valve regurgitation is not visualized. No aortic  stenosis is present. Pulmonic Valve: The pulmonic valve was normal in structure. Pulmonic valve regurgitation is not visualized. No evidence of pulmonic stenosis. Aorta: The aortic root is normal in size and structure. Venous: The inferior vena cava was not well visualized. The inferior vena cava is normal in size with greater than 50% respiratory variability, suggesting right atrial pressure of 3 mmHg. IAS/Shunts: No atrial level shunt detected by color flow Doppler. LEFT VENTRICLE PLAX 2D LVIDd:         4.60 cm LVIDs:         3.30 cm LV PW:         0.80 cm LV IVS:        0.70 cm LVOT diam:     1.90 cm LVOT Area:     2.84 cm  LEFT ATRIUM             Index LA diam:        3.50 cm 2.01 cm/m LA Vol (A2C):   41.2 ml 23.60 ml/m LA Vol (A4C):   26.1 ml 14.95 ml/m LA Biplane Vol: 33.6 ml 19.25 ml/m   AORTA Ao Root diam: 3.00 cm MITRAL VALVE MV Area (PHT): 6.17 cm     SHUNTS MV Decel Time: 123 msec     Systemic Diam: 1.90 cm MV E velocity: 126.00 cm/s Wilbert Bihari MD Electronically signed by Wilbert Bihari MD Signature Date/Time: 07/09/2024/4:54:24 PM    Final    ECHOCARDIOGRAM COMPLETE Result Date: 07/08/2024    ECHOCARDIOGRAM REPORT   Patient Name:   NEIDY GUERRIERI Date of Exam: 07/08/2024 Medical Rec #:  [id number]       Height:       63.0 in Accession #:    [id number]      Weight:       153.0 lb Date of Birth:  [birth year]  BSA:          1.726 m Patient Age:    78 years        BP:           134/59 mmHg Patient Gender: F               HR:           70 bpm. Exam Location:  Inpatient Procedure: 2D Echo, Cardiac Doppler and Color Doppler (Both Spectral and Color            Flow Doppler were utilized during procedure). STAT ECHO Indications:    Shock  History:        Patient has prior history of Echocardiogram examinations, most                 recent 07/08/2024. Acute MI and CAD, Signs/Symptoms:Chest Pain;                 Risk Factors:Hypertension, Diabetes and Dyslipidemia.  Sonographer:    Juliene Rucks Referring  Phys: [phone] AMY D CLEGG  Sonographer Comments: Technically challenging study due to limited acoustic windows. IMPRESSIONS  1. Left ventricular ejection fraction, by estimation, is 60 to 65%. The left ventricle has normal function. The left ventricle has no regional wall motion abnormalities. There is moderate concentric left ventricular hypertrophy. Left ventricular diastolic parameters are consistent with Grade I diastolic dysfunction (impaired relaxation).  2. Right ventricular systolic function is normal. The right ventricular size is normal.  3. The mitral valve is degenerative. Trivial mitral valve regurgitation. No evidence of mitral stenosis.  4. The aortic valve is tricuspid. There is mild calcification of the aortic valve. Aortic valve regurgitation is not visualized. No aortic stenosis is present.  5. The inferior vena cava is normal in size with greater than 50% respiratory variability, suggesting right atrial pressure of 3 mmHg. FINDINGS  Left Ventricle: Left ventricular ejection fraction, by estimation, is 60 to 65%. The left ventricle has normal function. The left ventricle has no regional wall motion abnormalities. The left ventricular internal cavity size was normal in size. There is  moderate concentric left ventricular hypertrophy. Left ventricular diastolic parameters are consistent with Grade I diastolic dysfunction (impaired relaxation). Right Ventricle: The right ventricular size is normal. No increase in right ventricular wall thickness. Right ventricular systolic function is normal. Left Atrium: Left atrial size was normal in size. Right Atrium: Right atrial size was normal in size. Pericardium: There is no evidence of pericardial effusion. Mitral Valve: The mitral valve is degenerative in appearance. Mild mitral annular calcification. Trivial mitral valve regurgitation. No evidence of mitral valve stenosis. Tricuspid Valve: The tricuspid valve is normal in structure. Tricuspid valve  regurgitation is trivial. No evidence of tricuspid stenosis. Aortic Valve: The aortic valve is tricuspid. There is mild calcification of the aortic valve. Aortic valve regurgitation is not visualized. No aortic stenosis is present. Pulmonic Valve: The pulmonic valve was normal in structure. Pulmonic valve regurgitation is trivial. No evidence of pulmonic stenosis. Aorta: The aortic root is normal in size and structure. Venous: The inferior vena cava is normal in size with greater than 50% respiratory variability, suggesting right atrial pressure of 3 mmHg. IAS/Shunts: No atrial level shunt detected by color flow Doppler.  LEFT VENTRICLE PLAX 2D LVIDd:         4.10 cm   Diastology LVIDs:         2.60 cm   LV e' medial:    5.25 cm/s  LV PW:         1.10 cm   LV E/e' medial:  16.2 LV IVS:        1.10 cm   LV e' lateral:   8.55 cm/s LVOT diam:     1.70 cm   LV E/e' lateral: 9.9 LVOT Area:     2.27 cm  RIGHT VENTRICLE RV S prime:     12.40 cm/s TAPSE (M-mode): 1.1 cm LEFT ATRIUM         Index LA diam:    2.90 cm 1.68 cm/m   AORTA Ao Root diam: 3.00 cm MITRAL VALVE MV Area (PHT): 3.37 cm     SHUNTS MV Decel Time: 225 msec     Systemic Diam: 1.70 cm MV E velocity: 84.90 cm/s MV A velocity: 110.00 cm/s MV E/A ratio:  0.77 Toribio Fuel MD Electronically signed by Toribio Fuel MD Signature Date/Time: 07/08/2024/9:28:41 PM    Final    ECHOCARDIOGRAM LIMITED Result Date: 07/08/2024    ECHOCARDIOGRAM LIMITED REPORT   Patient Name:   NETTYE FLEGAL Date of Exam: 07/08/2024 Medical Rec #:  [id number]       Height:       63.0 in Accession #:    [id number]      Weight:       153.0 lb Date of Birth:  March 18, 1946      BSA:          1.726 m Patient Age:    78 years        BP:           126/91 mmHg Patient Gender: F               HR:           50 bpm. Exam Location:  Inpatient Procedure: Limited Echo (Both Spectral and Color Flow Doppler were utilized            during procedure). STAT ECHO Indications:    cad of native  vessel  History:        Patient has prior history of Echocardiogram examinations, most                 recent 05/17/2020. CAD, Signs/Symptoms:Chest Pain; Risk                 Factors:Diabetes, Hypertension and Dyslipidemia.  Sonographer:    Tinnie Barefoot RDCS Referring Phys: [id number] GORDY BERGAMO IMPRESSIONS  1. Left ventricular ejection fraction, by estimation, is 65 to 70%. The left ventricle has normal function. There is moderate concentric left ventricular hypertrophy.  2. Right ventricular systolic function is normal. The right ventricular size is normal.  3. The mitral valve is grossly normal. Conclusion(s)/Recommendation(s): Limited echo for LV function. FINDINGS  Left Ventricle: Left ventricular ejection fraction, by estimation, is 65 to 70%. The left ventricle has normal function. There is moderate concentric left ventricular hypertrophy. Right Ventricle: The right ventricular size is normal. Right ventricular systolic function is normal. Pericardium: There is no evidence of pericardial effusion. Mitral Valve: The mitral valve is grossly normal. Mild mitral annular calcification. Tricuspid Valve: The tricuspid valve is grossly normal. Toribio Fuel MD Electronically signed by Toribio Fuel MD Signature Date/Time: 07/08/2024/3:42:18 PM    Final    CARDIAC CATHETERIZATION Result Date: 07/08/2024 Images from the original result were not included. Cardiac Catheterization 07/08/24: Hemodynamic data: LVEDP 68 mmHg.  No pressure gradient across the aortic valve. Angiographic data: LM: Mildly calcified but normal. LCx: Small  vessel, continuous is a small to medium sized OM 3 with tiny marginals coming proximally. LAD: Large-caliber vessel in the proximal segment, gives origin to large D1 and a moderate-sized D2.  Mid LAD has a focal 85% stenosis followed by tandem 60% stenosis and mid to distal and apical LAD is severely diffusely diseased and moderately calcified.  D1 has proximal 40% stenosis. RCA: Is a very  large caliber vessel and a dominant vessel giving origin to moderate-sized PDA and a large PL branch with secondary branches.  It is occluded after the origin of RV branch. Intervention data: Successful angioplasty and stenting of the proximal segment to the mid segment of the RCA with implantation of a 3.0 x 48 mm Synergy XD DES at 20 atmospheric pressure, stenosis reduced from 100% to 0%.  There was loss of RV branch but not of any clinical or hemodynamic consequence but minimal flow evident at the end of the case and suspect it will improve. Impression and recommendations: Patient needs aggressive risk modification, will need DAPT with aspirin  and Brilinta  for 1 year.   DG Chest Port 1 View Result Date: 07/08/2024 EXAM: 1 VIEW(S) XRAY OF THE CHEST 07/08/2024 12:21:00 PM COMPARISON: 05/22/2022 CLINICAL HISTORY: Chest pain. FINDINGS: LUNGS AND PLEURA: No focal pulmonary opacity. No pleural effusion. No pneumothorax. HEART AND MEDIASTINUM: Minimal aortic arch calcification. No acute abnormality of the cardiac and mediastinal silhouettes. BONES AND SOFT TISSUES: External defibrillator pad overlies right chest. No acute osseous abnormality. IMPRESSION: 1. No acute cardiopulmonary abnormality. 2. Minimal aortic arch calcification. Electronically signed by: Ryan Salvage MD 07/08/2024 01:13 PM EST RP Workstation: [id number]    Disposition Pt is being discharged home today in good condition.  Follow-up Plans & Appointments  Discharge Instructions     Amb Referral to Cardiac Rehabilitation   Complete by: As directed    Diagnosis:  Coronary Stents STEMI     After initial evaluation and assessments completed: Virtual Based Care may be provided alone or in conjunction with Phase 2 Cardiac Rehab based on patient barriers.: Yes   Intensive Cardiac Rehabilitation (ICR) [facility] location only OR Traditional Cardiac Rehabilitation (TCR) *If criteria for ICR are not met will enroll in TCR [facility] only): Yes   Call  MD for:  difficulty breathing, headache or visual disturbances   Complete by: As directed    Call MD for:  persistant dizziness or light-headedness   Complete by: As directed    Call MD for:  redness, tenderness, or signs of infection (pain, swelling, redness, odor or green/yellow discharge around incision site)   Complete by: As directed    Discharge instructions   Complete by: As directed    Radial Site Care Refer to this sheet in the next few weeks. These instructions provide you with information on caring for yourself after your procedure. Your caregiver may also give you more specific instructions. Your treatment has been planned according to current medical practices, but problems sometimes occur. Call your caregiver if you have any problems or questions after your procedure. HOME CARE INSTRUCTIONS You may shower the day after the procedure. Remove the bandage (dressing) and gently wash the site with plain soap and water . Gently pat the site dry.  Do not apply powder or lotion to the site.  Do not submerge the affected site in water  for 3 to 5 days.  Inspect the site at least twice daily.  Do not flex or bend the affected arm for 24 hours.  No lifting over 5 pounds (  2.3 kg) for 5 days after your procedure.  Do not drive home if you are discharged the same day of the procedure. Have someone else drive you.  You may drive 24 hours after the procedure unless otherwise instructed by your caregiver.  What to expect: Any bruising will usually fade within 1 to 2 weeks.  Blood that collects in the tissue (hematoma) may be painful to the touch. It should usually decrease in size and tenderness within 1 to 2 weeks.  SEEK IMMEDIATE MEDICAL CARE IF: You have unusual pain at the radial site.  You have redness, warmth, swelling, or pain at the radial site.  You have drainage (other than a small amount of blood on the dressing).  You have chills.  You have a fever or persistent symptoms for more  than 72 hours.  You have a fever and your symptoms suddenly get worse.  Your arm becomes pale, cool, tingly, or numb.  You have heavy bleeding from the site. Hold pressure on the site.   PLEASE DO NOT MISS ANY DOSES OF YOUR BRILINTA !!!!! Also keep a log of you blood pressures and bring back to your follow up appt. Please call the office with any questions.   Patients taking blood thinners should generally stay away from medicines like ibuprofen, Advil, Motrin, naproxen, and Aleve due to risk of stomach bleeding. You may take Tylenol  as directed or talk to your primary doctor about alternatives.   PLEASE ENSURE THAT YOU DO NOT RUN OUT OF YOUR BRILINTA . This medication is very important to remain on for at least one year. IF you have issues obtaining this medication due to cost please CALL the office 3-5 business days prior to running out in order to prevent missing doses of this medication.   Increase activity slowly   Complete by: As directed        Discharge Medications Allergies as of 07/10/2024       Reactions   Penicillins Hives      Hydrocodone  Nausea And Vomiting   Sulfa Antibiotics Rash        Medication List     STOP taking these medications    amLODipine  10 MG tablet Commonly known as: NORVASC        TAKE these medications    aspirin  EC 81 MG tablet Take 1 tablet (81 mg total) by mouth daily. Swallow whole. What changed:  when to take this reasons to take this additional instructions   atorvastatin  80 MG tablet Commonly known as: LIPITOR Take 1 tablet (80 mg total) by mouth daily.   Bromfenac  Sodium 0.07 % Soln Place 1 drop into the right eye 4 (four) times daily. What changed: when to take this   cholecalciferol  1000 units tablet Commonly known as: VITAMIN D  Take 1,000 Units by mouth daily.   CoQ-10 200 MG Caps Take 200 mg by mouth daily.   dapagliflozin  propanediol 10 MG Tabs tablet Commonly known as: Farxiga  Take 1 tablet (10 mg total) by  mouth daily.   ezetimibe  10 MG tablet Commonly known as: ZETIA  Take 1 tablet (10 mg total) by mouth daily. Start taking on: July 11, 2024   FreeStyle Libre 3 Plus Sensor Misc as directed.   GNP UltiCare Pen Needles 31G X 5 MM Misc Generic drug: Insulin  Pen Needle See admin instructions.   levothyroxine  75 MCG tablet Commonly known as: SYNTHROID  Take 1 tablet (75 mcg total) by mouth daily before breakfast.   metoprolol  succinate 25 MG 24 hr tablet  Commonly known as: Toprol  XL Take 1 tablet (25 mg total) by mouth daily.   prednisoLONE  acetate 1 % ophthalmic suspension Commonly known as: PRED FORTE  INSTILL 1 DROP INTO RIGHT EYE 4 TIMES A DAY What changed: See the new instructions.   ticagrelor  90 MG Tabs tablet Commonly known as: BRILINTA  Take 1 tablet (90 mg total) by mouth 2 (two) times daily.   Toujeo  SoloStar 300 UNIT/ML Solostar Pen Generic drug: insulin  glargine (1 Unit Dial) Inject 10 Units into the skin at bedtime. What changed: how much to take   Trulicity 1.5 MG/0.5ML Soaj Generic drug: Dulaglutide Inject 1.5 mg into the skin once a week.         Outstanding Labs/Studies  FLP/LFTs in 8 weeks  Duration of Discharge Encounter: APP Time: 20 minutes   Signed, Manuelita Rummer, NP 07/10/2024, 11:32 AM     "

## 2024-07-10 NOTE — Plan of Care (Signed)
   Problem: Education: Goal: Knowledge of General Education information will improve Description Including pain rating scale, medication(s)/side effects and non-pharmacologic comfort measures Outcome: Progressing   Problem: Health Behavior/Discharge Planning: Goal: Ability to manage health-related needs will improve Outcome: Progressing

## 2024-07-10 NOTE — TOC CM/SW Note (Signed)
 Transition of Care [facility]) - Inpatient Brief Assessment   Patient Details  Name: KAMERIN AXFORD MRN: [id number] Date of Birth: Mar 09, 1946  [facility] CM/SW Contact:    Sudie Erminio Deems, RN Phone Number: 07/10/2024, 11:40 AM   Clinical Narrative: Patient presented for acute MI-plan for home on [place] . Benefits check completed and co pay is $17.45.  PTA patient was independent from home with spouse. Spouse is at the bedside and will provide transportation home. Patient has insurance and PCP. No home needs identified at this time.    Transition of Care Asessment: Insurance and Status: Insurance coverage has been reviewed Patient has primary care physician: Yes Home environment has been reviewed: reviewed Prior level of function:: independent Prior/Current Home Services: No current home services Social Drivers of Health Review: SDOH reviewed no interventions necessary Readmission risk has been reviewed: Yes Transition of care needs: no transition of care needs at this time

## 2024-07-10 NOTE — Progress Notes (Signed)
 CARDIAC REHAB PHASE I   Patient completed ambulation without complication before education. Post MI/stent education including restrictions, risk factors, exercise guidelines, antiplatelet therapy importance, MI booklet, NTG use, heart healthy diabetic diet, and CRP2 reviewed. All questions and concerns addressed. Will refer to AP for CRP2.   [id number] Isaiah JAYSON [place], RN BSN 07/10/2024 10:01 AM

## 2024-07-10 NOTE — Progress Notes (Signed)
 Mobility Specialist Progress Note;    07/10/24 0937  Mobility  Activity Ambulated with assistance  Level of Assistance Contact guard assist, steadying assist  Assistive Device None  Distance Ambulated (ft) 325 ft  Activity Response Tolerated well  Mobility Referral Yes  Mobility visit 1 Mobility  Mobility Specialist Start Time (ACUTE ONLY) [id number]  Mobility Specialist Stop Time (ACUTE ONLY) 0947  Mobility Specialist Time Calculation (min) (ACUTE ONLY) 10 min   Pt received in chair agreeable to mobility. No physical assistance required, CGA for safety. No complaints throughout, although presented with a slight right lean, corrected with contact. Pt returned to chair with call bell and personal belongings in reach. All needs met.   Ricky Janeal MATSU, BS Mobility Specialist Please contact via Special Educational Needs Teacher or [employer] [phone]

## 2024-07-11 ENCOUNTER — Ambulatory Visit (HOSPITAL_COMMUNITY): Payer: Self-pay | Admitting: Cardiology

## 2024-07-13 ENCOUNTER — Telehealth: Payer: Self-pay

## 2024-07-13 NOTE — Patient Instructions (Signed)
 Visit Information  Thank you for taking time to visit with me today. Please don't hesitate to contact me if I can be of assistance to you before our next scheduled telephone appointment.  Our next appointment is by telephone on 07/21/24 at 130 pm  Following is a copy of your care plan:   Goals Addressed             This Visit's Progress    [facility] Transitions of Care (TOC) Care Plan       Problems:  Recent Hospitalization for treatment of STEMI Knowledge Deficit Related to STEMI  Goal:  Over the next 30 days, the patient will not experience hospital readmission  Interventions:  Transitions of Care: Doctor Visits  - discussed the importance of doctor visits Communication with PCP re: 30 day Enrollment Post discharge activity limitations prescribed by provider reviewed Return immediately (dial 911) if you experience:  Chest pain, pressure, or squeezing--especially radiating to jaw, neck, arm, back, or stomach Shortness of breath, lightheadedness, nausea, sweating associated with chest pain  Call your provider if you notice:  Confusion, reduced urination, increased swelling in feet/ankles New or worsening bleeding or bruising from medication use   Patient Self Care Activities:  Attend all scheduled provider appointments Call pharmacy for medication refills 3-7 days in advance of running out of medications Call provider office for new concerns or questions  Notify RN Care Manager of TOC call rescheduling needs Participate in Transition of Care Program/Attend TOC scheduled calls Take medications as prescribed   Monitor for Chest pain, pressure, or squeezing--especially radiating to jaw, neck, arm, back, or stomach Shortness of breath, lightheadedness, nausea, sweating associated with chest pain  Call your provider if you notice:  Confusion, reduced urination, increased swelling in feet/ankles New or worsening bleeding or bruising from medication use    Plan:  The patient has  been provided with contact information for the care management team and has been advised to call with any health related questions or concerns.         Patient verbalizes understanding of instructions and care plan provided today and agrees to view in MyChart. Active MyChart status and patient understanding of how to access instructions and care plan via MyChart confirmed with patient.       Please call the care guide team at [phone] if you need to cancel or reschedule your appointment.   Please call the Suicide and Crisis Lifeline: 988 if you are experiencing a Mental Health or Behavioral Health Crisis or need someone to talk to.  Saadia Dewitt J. Maybel Dambrosio RN, MSN [facility], [facility] RN Care Manager Direct Dial: [phone]  Fax: [phone] Website: [email or web address]

## 2024-07-14 ENCOUNTER — Emergency Department (HOSPITAL_COMMUNITY)

## 2024-07-14 ENCOUNTER — Encounter (HOSPITAL_COMMUNITY): Payer: Self-pay | Admitting: *Deleted

## 2024-07-14 ENCOUNTER — Other Ambulatory Visit: Payer: Self-pay

## 2024-07-14 ENCOUNTER — Emergency Department (HOSPITAL_COMMUNITY)
Admission: EM | Admit: 2024-07-14 | Discharge: 2024-07-14 | Disposition: A | Discharge status: Home / Self Care | Source: Home / Self Care | Attending: Emergency Medicine | Admitting: Emergency Medicine

## 2024-07-14 DIAGNOSIS — I10 Essential (primary) hypertension: Secondary | ICD-10-CM

## 2024-07-14 DIAGNOSIS — H43393 Other vitreous opacities, bilateral: Secondary | ICD-10-CM

## 2024-07-14 DIAGNOSIS — R519 Headache, unspecified: Secondary | ICD-10-CM

## 2024-07-14 DIAGNOSIS — I671 Cerebral aneurysm, nonruptured: Secondary | ICD-10-CM

## 2024-07-14 LAB — URINALYSIS, ROUTINE W REFLEX MICROSCOPIC
Bacteria, UA: NONE SEEN
Bilirubin Urine: NEGATIVE
Glucose, UA: >=500 mg/dL — AB
Hgb urine dipstick: NEGATIVE
Ketones, ur: NEGATIVE mg/dL
Leukocytes,Ua: NEGATIVE
Nitrite: NEGATIVE
Protein, ur: NEGATIVE mg/dL
Specific Gravity, Urine: 1.005 (ref 1.005–1.030)
pH: 7 (ref 5.0–8.0)

## 2024-07-14 LAB — COMPREHENSIVE METABOLIC PANEL WITH GFR
ALT: 13 U/L (ref 0–44)
AST: 18 U/L (ref 15–41)
Albumin: 4.2 g/dL (ref 3.5–5.0)
Alkaline Phosphatase: 88 U/L (ref 38–126)
Anion gap: 16 — ABNORMAL HIGH (ref 5–15)
BUN: 8 mg/dL (ref 8–23)
CO2: 21 mmol/L — ABNORMAL LOW (ref 22–32)
Calcium: 9.9 mg/dL (ref 8.9–10.3)
Chloride: 105 mmol/L (ref 98–111)
Creatinine, Ser: 0.63 mg/dL (ref 0.44–1.00)
GFR, Estimated: >60 mL/min
Glucose, Bld: 133 mg/dL — ABNORMAL HIGH (ref 70–99)
Potassium: 3.7 mmol/L (ref 3.5–5.1)
Sodium: 142 mmol/L (ref 135–145)
Total Bilirubin: 0.5 mg/dL (ref 0.0–1.2)
Total Protein: 8 g/dL (ref 6.5–8.1)

## 2024-07-14 LAB — CBC WITH DIFFERENTIAL/PLATELET
Abs Immature Granulocytes: 0.04 10*3/uL (ref 0.00–0.07)
Basophils Absolute: 0 10*3/uL (ref 0.0–0.1)
Basophils Relative: 0 %
Eosinophils Absolute: 0.2 10*3/uL (ref 0.0–0.5)
Eosinophils Relative: 2 %
HCT: 34.8 % — ABNORMAL LOW (ref 36.0–46.0)
Hemoglobin: 11.4 g/dL — ABNORMAL LOW (ref 12.0–15.0)
Immature Granulocytes: 0 %
Lymphocytes Relative: 18 %
Lymphs Abs: 1.8 10*3/uL (ref 0.7–4.0)
MCH: 27.9 pg (ref 26.0–34.0)
MCHC: 32.8 g/dL (ref 30.0–36.0)
MCV: 85.3 fL (ref 80.0–100.0)
Monocytes Absolute: 0.6 10*3/uL (ref 0.1–1.0)
Monocytes Relative: 6 %
Neutro Abs: 7.1 10*3/uL (ref 1.7–7.7)
Neutrophils Relative %: 74 %
Platelets: 247 10*3/uL (ref 150–400)
RBC: 4.08 MIL/uL (ref 3.87–5.11)
RDW: 14.1 % (ref 11.5–15.5)
WBC: 9.8 10*3/uL (ref 4.0–10.5)
nRBC: 0 % (ref 0.0–0.2)

## 2024-07-14 LAB — MAGNESIUM: Magnesium: 2.4 mg/dL (ref 1.7–2.4)

## 2024-07-14 LAB — TROPONIN T, HIGH SENSITIVITY
Troponin T High Sensitivity: 549 ng/L (ref 0–19)
Troponin T High Sensitivity: 509 ng/L (ref 0–19)

## 2024-07-14 LAB — D-DIMER, QUANTITATIVE: D-Dimer, Quant: 1.18 ug{FEU}/mL — ABNORMAL HIGH (ref 0.00–0.50)

## 2024-07-14 MED ORDER — IOHEXOL 350 MG/ML SOLN
150.0000 mL | Freq: Once | INTRAVENOUS | Status: AC | PRN
  Administered 2024-07-14: 150 mL via INTRAVENOUS

## 2024-07-14 MED ORDER — PANTOPRAZOLE SODIUM 40 MG IV SOLR
40.0000 mg | Freq: Once | INTRAVENOUS | Status: AC
  Administered 2024-07-14: 40 mg via INTRAVENOUS
  Filled 2024-07-14: qty 10

## 2024-07-14 MED ORDER — ACETAMINOPHEN 500 MG PO TABS
1000.0000 mg | ORAL_TABLET | Freq: Once | ORAL | Status: AC
  Administered 2024-07-14: 1000 mg via ORAL
  Filled 2024-07-14: qty 2

## 2024-07-14 NOTE — Discharge Instructions (Addendum)
 Thank you for coming to [facility] Emergency Department. You were seen for mild headache, floaters in your vision, indigestion, and not feeling like yourself. We did an exam, labs, and imaging, and these showed no acute findings.  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 Please schedule a follow-up appointment with neurosurgeon Dr. Lanis in [place] within 1 week for follow-up for this. They are aware of you and will call you to schedule an appointment.  For the floaters in your eyes, please go to your ophthalmology appointment tomorrow as previously scheduled.  For your blood pressure, you can follow up with your cardiologist on 2/10 as previously scheduled. They can help you manage your blood pressure in clinic. You can also discuss the symptoms you have been having to see if they want to make changes to your medications.  Do not hesitate to return to the ED or call 911 if you experience: -Worsening symptoms -Weakness on one side or another, slurred speech, facial droop, blurry vision, double vision, confusion, numbness tingling -Falls, head trauma -Severe chest pain or shortness of breath -Blood pressure greater than 180 over greater than 110 with any of the associated above symptoms -Lightheadedness, passing out -Fevers/chills -Anything else that concerns you

## 2024-07-15 ENCOUNTER — Encounter (INDEPENDENT_AMBULATORY_CARE_PROVIDER_SITE_OTHER): Payer: Self-pay | Admitting: Ophthalmology

## 2024-07-15 ENCOUNTER — Ambulatory Visit (INDEPENDENT_AMBULATORY_CARE_PROVIDER_SITE_OTHER): Admitting: Ophthalmology

## 2024-07-15 DIAGNOSIS — Z794 Long term (current) use of insulin: Secondary | ICD-10-CM

## 2024-07-15 DIAGNOSIS — Z7984 Long term (current) use of oral hypoglycemic drugs: Secondary | ICD-10-CM

## 2024-07-15 DIAGNOSIS — Z7985 Long-term (current) use of injectable non-insulin antidiabetic drugs: Secondary | ICD-10-CM

## 2024-07-15 DIAGNOSIS — I10 Essential (primary) hypertension: Secondary | ICD-10-CM

## 2024-07-15 DIAGNOSIS — E113313 Type 2 diabetes mellitus with moderate nonproliferative diabetic retinopathy with macular edema, bilateral: Secondary | ICD-10-CM

## 2024-07-15 DIAGNOSIS — H35373 Puckering of macula, bilateral: Secondary | ICD-10-CM

## 2024-07-15 DIAGNOSIS — Z961 Presence of intraocular lens: Secondary | ICD-10-CM

## 2024-07-15 DIAGNOSIS — H35033 Hypertensive retinopathy, bilateral: Secondary | ICD-10-CM | POA: Diagnosis not present

## 2024-07-15 MED ORDER — AFLIBERCEPT 2MG/0.05ML IZ SOLN FOR KALEIDOSCOPE
2.0000 mg | INTRAVITREAL | Status: AC | PRN
  Administered 2024-07-15: 2 mg via INTRAVITREAL

## 2024-07-16 MED FILL — Ondansetron HCl Inj 4 MG/2ML (2 MG/ML): INTRAMUSCULAR | Qty: 2 | Status: AC

## 2024-07-16 MED FILL — Fentanyl Citrate Preservative Free (PF) Inj 100 MCG/2ML: INTRAMUSCULAR | Qty: 2 | Status: AC

## 2024-07-21 ENCOUNTER — Telehealth

## 2024-07-21 ENCOUNTER — Ambulatory Visit: Admitting: Nurse Practitioner

## 2024-09-09 ENCOUNTER — Encounter (INDEPENDENT_AMBULATORY_CARE_PROVIDER_SITE_OTHER): Admitting: Ophthalmology
